# Patient Record
Sex: Male | Born: 1960 | Race: Black or African American | Hispanic: No | Marital: Single | Smoking: Current every day smoker
Health system: Southern US, Community
[De-identification: ages and names within clinical notes are randomized; demographics above are authoritative.]

## PROBLEM LIST (undated history)

## (undated) ENCOUNTER — Ambulatory Visit (HOSPITAL_COMMUNITY): Admission: EM | Payer: Managed Care, Other (non HMO)

## (undated) DIAGNOSIS — I252 Old myocardial infarction: Secondary | ICD-10-CM

## (undated) DIAGNOSIS — J45909 Unspecified asthma, uncomplicated: Secondary | ICD-10-CM

## (undated) DIAGNOSIS — I2510 Atherosclerotic heart disease of native coronary artery without angina pectoris: Secondary | ICD-10-CM

## (undated) DIAGNOSIS — J449 Chronic obstructive pulmonary disease, unspecified: Secondary | ICD-10-CM

## (undated) HISTORY — PX: HERNIA REPAIR: SHX51

---

## 1998-03-26 ENCOUNTER — Emergency Department (HOSPITAL_COMMUNITY): Admission: EM | Admit: 1998-03-26 | Discharge: 1998-03-26 | Payer: Self-pay | Admitting: Family Medicine

## 1999-07-14 ENCOUNTER — Encounter: Payer: Self-pay | Admitting: Emergency Medicine

## 1999-07-14 ENCOUNTER — Emergency Department (HOSPITAL_COMMUNITY): Admission: EM | Admit: 1999-07-14 | Discharge: 1999-07-14 | Payer: Self-pay | Admitting: Emergency Medicine

## 2002-05-26 ENCOUNTER — Encounter: Payer: Self-pay | Admitting: Emergency Medicine

## 2002-05-26 ENCOUNTER — Emergency Department (HOSPITAL_COMMUNITY): Admission: EM | Admit: 2002-05-26 | Discharge: 2002-05-26 | Payer: Self-pay | Admitting: Emergency Medicine

## 2004-10-20 ENCOUNTER — Emergency Department (HOSPITAL_COMMUNITY): Admission: EM | Admit: 2004-10-20 | Discharge: 2004-10-20 | Payer: Self-pay | Admitting: Emergency Medicine

## 2004-10-20 ENCOUNTER — Emergency Department (HOSPITAL_COMMUNITY): Admission: EM | Admit: 2004-10-20 | Discharge: 2004-10-21 | Payer: Self-pay | Admitting: Emergency Medicine

## 2004-11-29 ENCOUNTER — Emergency Department (HOSPITAL_COMMUNITY): Admission: EM | Admit: 2004-11-29 | Discharge: 2004-11-29 | Payer: Self-pay | Admitting: Emergency Medicine

## 2011-09-17 ENCOUNTER — Encounter (HOSPITAL_COMMUNITY): Payer: Self-pay | Admitting: *Deleted

## 2011-09-17 ENCOUNTER — Emergency Department (HOSPITAL_COMMUNITY): Payer: Self-pay

## 2011-09-17 ENCOUNTER — Emergency Department (HOSPITAL_COMMUNITY)
Admission: EM | Admit: 2011-09-17 | Discharge: 2011-09-17 | Disposition: A | Discharge status: Home / Self Care | Payer: Self-pay | Attending: Emergency Medicine | Admitting: Emergency Medicine

## 2011-09-17 DIAGNOSIS — A088 Other specified intestinal infections: Secondary | ICD-10-CM | POA: Insufficient documentation

## 2011-09-17 DIAGNOSIS — R109 Unspecified abdominal pain: Secondary | ICD-10-CM | POA: Insufficient documentation

## 2011-09-17 DIAGNOSIS — R05 Cough: Secondary | ICD-10-CM

## 2011-09-17 DIAGNOSIS — K4090 Unilateral inguinal hernia, without obstruction or gangrene, not specified as recurrent: Secondary | ICD-10-CM | POA: Insufficient documentation

## 2011-09-17 DIAGNOSIS — R059 Cough, unspecified: Secondary | ICD-10-CM | POA: Insufficient documentation

## 2011-09-17 DIAGNOSIS — A084 Viral intestinal infection, unspecified: Secondary | ICD-10-CM

## 2011-09-17 DIAGNOSIS — R51 Headache: Secondary | ICD-10-CM | POA: Insufficient documentation

## 2011-09-17 LAB — DIFFERENTIAL
Eosinophils Absolute: 0 10*3/uL (ref 0.0–0.7)
Lymphs Abs: 1.6 10*3/uL (ref 0.7–4.0)
Monocytes Absolute: 0.6 10*3/uL (ref 0.1–1.0)
Monocytes Relative: 11 % (ref 3–12)
Neutrophils Relative %: 61 % (ref 43–77)

## 2011-09-17 LAB — COMPREHENSIVE METABOLIC PANEL
Alkaline Phosphatase: 59 U/L (ref 39–117)
BUN: 10 mg/dL (ref 6–23)
Creatinine, Ser: 0.8 mg/dL (ref 0.50–1.35)
GFR calc Af Amer: >90 mL/min (ref >90)
Glucose, Bld: 104 mg/dL — ABNORMAL HIGH (ref 70–99)
Potassium: 3.5 mEq/L (ref 3.5–5.1)
Total Bilirubin: 0.2 mg/dL — ABNORMAL LOW (ref 0.3–1.2)
Total Protein: 6.8 g/dL (ref 6.0–8.3)

## 2011-09-17 LAB — URINALYSIS, ROUTINE W REFLEX MICROSCOPIC
Bilirubin Urine: NEGATIVE
Ketones, ur: NEGATIVE mg/dL
Nitrite: NEGATIVE
Specific Gravity, Urine: 1.024 (ref 1.005–1.030)
Urobilinogen, UA: 4 mg/dL — ABNORMAL HIGH (ref 0.0–1.0)

## 2011-09-17 LAB — CBC
HCT: 41.5 % (ref 39.0–52.0)
Hemoglobin: 15.1 g/dL (ref 13.0–17.0)
MCH: 33.9 pg (ref 26.0–34.0)
MCV: 93.3 fL (ref 78.0–100.0)
RBC: 4.45 MIL/uL (ref 4.22–5.81)

## 2011-09-17 LAB — LIPASE, BLOOD: Lipase: 21 U/L (ref 11–59)

## 2011-09-17 MED ORDER — AZITHROMYCIN 250 MG PO TABS: 250.0000 mg | ORAL_TABLET | Freq: Every day | ORAL | Status: AC

## 2011-09-17 MED ORDER — IOHEXOL 300 MG/ML  SOLN
100.0000 mL | Freq: Once | INTRAMUSCULAR | Status: AC | PRN
  Administered 2011-09-17: 100 mL via INTRAVENOUS

## 2011-09-17 MED ORDER — MORPHINE SULFATE 4 MG/ML IJ SOLN
4.0000 mg | Freq: Once | INTRAMUSCULAR | Status: AC
  Administered 2011-09-17: 4 mg via INTRAVENOUS
  Filled 2011-09-17: qty 1

## 2011-09-17 MED ORDER — SODIUM CHLORIDE 0.9 % IV BOLUS (SEPSIS)
1000.0000 mL | Freq: Once | INTRAVENOUS | Status: AC
  Administered 2011-09-17: 1000 mL via INTRAVENOUS

## 2011-09-17 MED ORDER — ONDANSETRON HCL 4 MG/2ML IJ SOLN
4.0000 mg | Freq: Once | INTRAMUSCULAR | Status: AC
  Administered 2011-09-17: 4 mg via INTRAVENOUS
  Filled 2011-09-17: qty 2

## 2011-09-17 MED ORDER — SODIUM CHLORIDE 0.9 % IV SOLN
Freq: Once | INTRAVENOUS | Status: AC
  Administered 2011-09-17: 13:00:00 via INTRAVENOUS

## 2011-09-17 MED ORDER — ACETAMINOPHEN 325 MG PO TABS
650.0000 mg | ORAL_TABLET | Freq: Four times a day (QID) | ORAL | Status: DC | PRN
  Administered 2011-09-17: 650 mg via ORAL
  Filled 2011-09-17: qty 2

## 2011-09-17 MED ORDER — AZITHROMYCIN 250 MG PO TABS
500.0000 mg | ORAL_TABLET | Freq: Once | ORAL | Status: AC
  Administered 2011-09-17: 500 mg via ORAL
  Filled 2011-09-17: qty 2

## 2011-09-17 NOTE — ED Notes (Signed)
MD at bedside. (Dr. Webb) 

## 2011-09-17 NOTE — ED Notes (Addendum)
Pt also c/o productive cough w/thick white colored mucus x5 days

## 2011-09-17 NOTE — ED Provider Notes (Signed)
Patient moved to CDU pending completion of diagnostic testing in the evaluation of abdominal pain with fever.  4:29 PM CT results reviewed and discussed with patient and with Dr. Hyman Hopes.  No acute abdominal findings.  Patient is feeling better, temperature 99.3, HR 90.  No additional episodes of diarrhea or vomiting while in ED.  Patient is tolerating po fluids without difficulty.  Will cover with azithromycin d/t emphysema with cough and fever.  Jimmye Norman, NP 09/17/11 2026

## 2011-09-17 NOTE — ED Notes (Signed)
Pt resting.  Denies any needs.   

## 2011-09-17 NOTE — ED Notes (Signed)
Report received, assumed care.  

## 2011-09-17 NOTE — ED Provider Notes (Signed)
History     CSN: [id number]  Arrival date & time 09/17/11  0865   First MD Initiated Contact with Patient 09/17/11 [phone]      Chief Complaint  Patient presents with  . Fever  . Nausea  . Emesis  . Diarrhea  . Cough  . Headache    (Consider location/radiation/quality/duration/timing/severity/associated sxs/prior treatment) HPI  50yoM pw fever. She complaining of multiple complaints. He states for the past 2-3 days he has experienced productive cough, shortness of breath, mild chest pain as well as generalized body aches. He complains of severe 7/10 abdominal pain which is diffuse. Complains of nausea with multiple episodes of nonbilious nonbloody emesis. Denies diarrhea. Denies hematuria/dysuria/freq/urgency.  ED Notes, ED Provider Notes from 09/17/11 0000 to 09/17/11 08:42:31       Melissa Loyal Gambler, RN 09/17/2011 08:36      Pt reports his mother has been sick at [facility] and he refused to wear a mask. Reports productive cough, fever, n/v/d, headache, and generalized bodyaches.   History reviewed. No pertinent past medical history.  History reviewed. No pertinent past surgical history.  History reviewed. No pertinent family history.  History  Substance Use Topics  . Smoking status: Current Everyday Smoker  . Smokeless tobacco: Not on file  . Alcohol Use: Yes     occ    Review of Systems  All other systems reviewed and are negative.   except as noted HPI   Allergies  Penicillins  Home Medications   Current Outpatient Rx  Name Route Sig Dispense Refill  . ASPIRIN 325 MG PO TABS Oral Take 325-650 mg by mouth every 6 (six) hours as needed. Pain.    Marland Kitchen PHENOL 1.4 % MT LIQD Mouth/Throat Use as directed 2 sprays in the mouth or throat as needed. Sore throat.    Marland Kitchen PSEUDOEPH-DOXYLAMINE-DM-APAP 60-7.11-17-998 MG/30ML PO LIQD Oral Take 30 mLs by mouth every 12 (twelve) hours as needed. As needed for cold symptoms    . AZITHROMYCIN 250 MG PO TABS Oral Take 1 tablet  (250 mg total) by mouth daily. Take  1 every day until finished. 4 tablet 0    BP 113/57  Pulse 89  Temp(Src) 100.7 F (38.2 C) (Oral)  Resp 18  SpO2 98%  Physical Exam  Nursing note and vitals reviewed. Constitutional: He is oriented to person, place, and time. He appears well-developed and well-nourished. No distress.  HENT:  Head: Atraumatic.  Mouth/Throat: Oropharynx is clear and moist.  Eyes: Conjunctivae are normal. Pupils are equal, round, and reactive to light.  Neck: Neck supple.  Cardiovascular: Normal rate, regular rhythm, normal heart sounds and intact distal pulses.  Exam reveals no gallop and no friction rub.   No murmur heard. Pulmonary/Chest: Effort normal. No respiratory distress. He has no wheezes. He has no rales.       Crackles R base, L mid lung fields  Abdominal: Soft. Bowel sounds are normal. There is tenderness. There is no rebound and no guarding.       Diffuse ttp  Musculoskeletal: Normal range of motion. He exhibits no edema and no tenderness.  Neurological: He is alert and oriented to person, place, and time.  Skin: Skin is warm and dry.  Psychiatric: He has a normal mood and affect.    ED Course  Procedures (including critical care time)  Labs Reviewed  CBC - Abnormal; Notable for the following:    MCHC 36.4 (*)    Platelets 120 (*)    All other  components within normal limits  COMPREHENSIVE METABOLIC PANEL - Abnormal; Notable for the following:    Sodium 134 (*)    Glucose, Bld 104 (*)    Albumin 3.1 (*)    AST 45 (*)    Total Bilirubin 0.2 (*)    All other components within normal limits  URINALYSIS, ROUTINE W REFLEX MICROSCOPIC - Abnormal; Notable for the following:    APPearance HAZY (*)    Urobilinogen, UA 4.0 (*)    Leukocytes, UA SMALL (*)    All other components within normal limits  DIFFERENTIAL  LIPASE, BLOOD  URINE MICROSCOPIC-ADD ON  LAB REPORT - SCANNED   Dg Chest 2 View  09/17/2011  *RADIOLOGY REPORT*  Clinical Data:  Cough.  Fever.  Nausea and vomiting.  CHEST - 2 VIEW  Comparison: None.  Findings: The cardiopericardial silhouette and mediastinal contours appear within normal limits.  Emphysema is present with hyperinflation and enlargement retrosternal clear space.  No airspace disease.  No effusion.  Prominence of the pulmonary hila is likely associated with emphysema.  Bilateral AC joint osteoarthritis.  IMPRESSION: Emphysema without acute cardiopulmonary disease.  Original Report Authenticated By: Andreas Newport, M.D.   Ct Abdomen Pelvis W Contrast  09/17/2011  *RADIOLOGY REPORT*  Clinical Data: Headache with nausea, vomiting and diarrhea for 5 days.  CT ABDOMEN AND PELVIS WITH CONTRAST  Technique:  Multidetector CT imaging of the abdomen and pelvis was performed following the standard protocol during bolus administration of intravenous contrast.  Contrast: OMNIPAQUE IOHEXOL 300 MG/ML IJ SOLN  Comparison: None.  Findings: The lung bases are clear and there is no pleural effusion.  The liver, gallbladder, biliary system and pancreas appear normal.  The spleen, adrenal glands and kidneys appear normal.  The bowel gas pattern is normal. There are mild diverticular changes of the colon, especially in the descending and sigmoid areas.  The appendix appears normal.  There are no enlarged lymph nodes or inflammatory changes.  The prostate gland appears mildly enlarged.  There is an atypical dense and somewhat linear calcification posteriorly in the prostate gland, measuring up to 1.4 cm in diameter.  This is visible on the scout image and appears to be posterior to the expected position of the urethra.  There are no definite surrounding inflammatory changes.  The urinary bladder appears normal.  There is a small right inguinal hernia containing only fat.  Mild disc bulging is present at L5-S1.  There are no worrisome osseous findings.  IMPRESSION:  1.  No definite acute abdominal pelvic findings.  Normal bowel gas pattern  without evidence of appendicitis. 2.  Atypical dense linear calcification within the prostate gland of uncertain significance. 3.  Small right inguinal hernia containing only fat.  Original Report Authenticated By: Gerrianne Scale, M.D.     1. Abdominal  pain, other specified site   2. Viral gastroenteritis   3. Productive cough     MDM  Likely viral syndrome. Pt with multiple complaints. Feeling better after tylenol in ED. CXR without pneumonia. He continued to have 9/10 abdominal pain, therefore an abd ct was ordered which was unremarkable. Pt placed in CDU, signed out to Felicie Morn previously who will discharge with azithromycin for bronchitis.        Forbes Cellar, MD 09/18/11 [phone]

## 2011-09-17 NOTE — ED Notes (Signed)
Patient transported to CT 

## 2011-09-17 NOTE — ED Notes (Signed)
Pt reports his mother has been sick at [facility] and he refused to wear a mask. Reports productive cough, fever, n/v/d, headache, and generalized bodyaches.

## 2011-09-17 NOTE — ED Notes (Signed)
Family at bedside. 

## 2011-09-17 NOTE — Discharge Instructions (Signed)
Cough, Adult  A cough is a reflex that helps clear your throat and airways. It can help heal the body or may be a reaction to an irritated airway. A cough may only last 2 or 3 weeks (acute) or may last more than 8 weeks (chronic).  CAUSES Acute cough:  Viral or bacterial infections.  Chronic cough:  Infections.   Allergies.   Asthma.   Post-nasal drip.   Smoking.   Heartburn or acid reflux.   Some medicines.   Chronic lung problems (COPD).   Cancer.  SYMPTOMS   Cough.   Fever.   Chest pain.   Increased breathing rate.   High-pitched whistling sound when breathing (wheezing).   Colored mucus that you cough up (sputum).  TREATMENT   A bacterial cough may be treated with antibiotic medicine.   A viral cough must run its course and will not respond to antibiotics.   Your caregiver may recommend other treatments if you have a chronic cough.  HOME CARE INSTRUCTIONS   Only take over-the-counter or prescription medicines for pain, discomfort, or fever as directed by your caregiver. Use cough suppressants only as directed by your caregiver.   Use a cold steam vaporizer or humidifier in your bedroom or home to help loosen secretions.   Sleep in a semi-upright position if your cough is worse at night.   Rest as needed.   Stop smoking if you smoke.  SEEK IMMEDIATE MEDICAL CARE IF:   You have pus in your sputum.   Your cough starts to worsen.   You cannot control your cough with suppressants and are losing sleep.   You begin coughing up blood.   You have difficulty breathing.   You develop pain which is getting worse or is uncontrolled with medicine.   You have a fever.  MAKE SURE YOU:   Understand these instructions.   Will watch your condition.   Will get help right away if you are not doing well or get worse.  Document Released: 12/04/2010 Document Revised: 05/27/2011 Document Reviewed: 12/04/2010 [facility] Patient Information 2012 [place],  [place].  Viral Gastroenteritis Viral gastroenteritis is also known as stomach flu. This condition affects the stomach and intestinal tract. It can cause sudden diarrhea and vomiting. The illness typically lasts 3 to 8 days. Most people develop an immune response that eventually gets rid of the virus. While this natural response develops, the virus can make you quite ill. CAUSES  Many different viruses can cause gastroenteritis, such as rotavirus or noroviruses. You can catch one of these viruses by consuming contaminated food or water. You may also catch a virus by sharing utensils or other personal items with an infected person or by touching a contaminated surface. SYMPTOMS  The most common symptoms are diarrhea and vomiting. These problems can cause a severe loss of body fluids (dehydration) and a body salt (electrolyte) imbalance. Other symptoms may include:  Fever.   Headache.   Fatigue.   Abdominal pain.  DIAGNOSIS  Your caregiver can usually diagnose viral gastroenteritis based on your symptoms and a physical exam. A stool sample may also be taken to test for the presence of viruses or other infections. TREATMENT  This illness typically goes away on its own. Treatments are aimed at rehydration. The most serious cases of viral gastroenteritis involve vomiting so severely that you are not able to keep fluids down. In these cases, fluids must be given through an intravenous line (IV). HOME CARE INSTRUCTIONS   Drink enough  fluids to keep your urine clear or pale yellow. Drink small amounts of fluids frequently and increase the amounts as tolerated.   Ask your caregiver for specific rehydration instructions.   Avoid:   Foods high in sugar.   Alcohol.   Carbonated drinks.   Tobacco.   Juice.   Caffeine drinks.   Extremely hot or cold fluids.   Fatty, greasy foods.   Too much intake of anything at one time.   Dairy products until 24 to 48 hours after diarrhea stops.   You  may consume probiotics. Probiotics are active cultures of beneficial bacteria. They may lessen the amount and number of diarrheal stools in adults. Probiotics can be found in yogurt with active cultures and in supplements.   Wash your hands well to avoid spreading the virus.   Only take over-the-counter or prescription medicines for pain, discomfort, or fever as directed by your caregiver. Do not give aspirin to children. Antidiarrheal medicines are not recommended.   Ask your caregiver if you should continue to take your regular prescribed and over-the-counter medicines.   Keep all follow-up appointments as directed by your caregiver.  SEEK IMMEDIATE MEDICAL CARE IF:   You are unable to keep fluids down.   You do not urinate at least once every 6 to 8 hours.   You develop shortness of breath.   You notice blood in your stool or vomit. This may look like coffee grounds.   You have abdominal pain that increases or is concentrated in one small area (localized).   You have persistent vomiting or diarrhea.   You have a fever.   The patient is a child younger than 3 months, and he or she has a fever.   The patient is a child older than 3 months, and he or she has a fever and persistent symptoms.   The patient is a child older than 3 months, and he or she has a fever and symptoms suddenly get worse.   The patient is a baby, and he or she has no tears when crying.  MAKE SURE YOU:   Understand these instructions.   Will watch your condition.   Will get help right away if you are not doing well or get worse.  Document Released: 06/07/2005 Document Revised: 05/27/2011 Document Reviewed: 03/24/2011 [facility] Patient Information 2012 [place], [place].

## 2011-09-17 NOTE — ED Notes (Signed)
Reports no emesis, diarrhea since he has been here in ED.  Respirations even & unlabored, no distress.

## 2011-09-17 NOTE — ED Notes (Signed)
Pt c/o headache, N/V/D, non cardiac chest pain, chills, and weakness x5 days. Pt reports visiting his mother at [facility], reports his mother was dx w/a virus, pt unsure of virus but reports his s/s are identical to hers, pt refused to wear a mask while visiting his mother.

## 2011-09-17 NOTE — ED Notes (Signed)
Finished contrast, CT notified & aware.

## 2011-09-18 NOTE — ED Provider Notes (Signed)
Medical screening examination/treatment/procedure(s) were conducted as a shared visit with non-physician practitioner(s) and myself.  I personally evaluated the patient during the encounter  See my full note  Forbes Cellar, MD 09/18/11 [phone]

## 2012-01-06 ENCOUNTER — Encounter (HOSPITAL_COMMUNITY): Payer: Self-pay | Admitting: Emergency Medicine

## 2012-01-06 ENCOUNTER — Emergency Department (HOSPITAL_COMMUNITY): Payer: Self-pay

## 2012-01-06 ENCOUNTER — Emergency Department (HOSPITAL_COMMUNITY)
Admission: EM | Admit: 2012-01-06 | Discharge: 2012-01-06 | Disposition: A | Discharge status: Home / Self Care | Payer: Self-pay | Attending: Emergency Medicine | Admitting: Emergency Medicine

## 2012-01-06 DIAGNOSIS — R202 Paresthesia of skin: Secondary | ICD-10-CM

## 2012-01-06 DIAGNOSIS — F17200 Nicotine dependence, unspecified, uncomplicated: Secondary | ICD-10-CM | POA: Insufficient documentation

## 2012-01-06 DIAGNOSIS — J40 Bronchitis, not specified as acute or chronic: Secondary | ICD-10-CM | POA: Insufficient documentation

## 2012-01-06 DIAGNOSIS — R209 Unspecified disturbances of skin sensation: Secondary | ICD-10-CM | POA: Insufficient documentation

## 2012-01-06 MED ORDER — ALBUTEROL SULFATE HFA 108 (90 BASE) MCG/ACT IN AERS
2.0000 | INHALATION_SPRAY | RESPIRATORY_TRACT | Status: DC | PRN
  Administered 2012-01-06: 2 via RESPIRATORY_TRACT
  Filled 2012-01-06: qty 6.7

## 2012-01-06 MED ORDER — ALBUTEROL SULFATE (5 MG/ML) 0.5% IN NEBU
5.0000 mg | INHALATION_SOLUTION | Freq: Once | RESPIRATORY_TRACT | Status: AC
  Administered 2012-01-06: 5 mg via RESPIRATORY_TRACT
  Filled 2012-01-06: qty 1

## 2012-01-06 NOTE — ED Notes (Signed)
coold symptoms started 2 weeks ago, coughing up phlegm, leg numbness started 2 days ago--right leg only

## 2012-01-06 NOTE — ED Provider Notes (Signed)
History     CSN: [id number]  Arrival date & time 01/06/12  1306   First MD Initiated Contact with Patient 01/06/12 1318      Chief Complaint  Patient presents with  . URI  . legs numb     (Consider location/radiation/quality/duration/timing/severity/associated sxs/prior treatment) Patient is a 51 y.o. male presenting with URI. The history is provided by the patient.  URI The primary symptoms include cough. Primary symptoms do not include fever, headaches, ear pain, sore throat, abdominal pain, nausea, vomiting or myalgias. Primary symptoms comment: neg chest pain Episode onset: 2 weeks ago. This is a new problem. The problem has not changed since onset. The cough is productive. The sputum is white. It is exacerbated by smoking.  Symptoms associated with the illness include congestion. The illness is not associated with chills, plugged ear sensation, facial pain or rhinorrhea. The following treatments were addressed: A decongestant was ineffective.  No baseline lung disease. Tried nephew's inhaler yesterday with some sx improvement. Also with c/o numbness to dorsum of right mid-foot and a shooting sensation (not painful) up the entire extremity anteriorly. Feels his leg has given out on him several times and he has fallen twice without injury. Denies fever, HA, back pain. Able to ambulate. No known injury and no hx similar symptoms. No aggravating or alleviating factors, no prior tx attempted.  History reviewed. No pertinent past medical history.  Past Surgical History  Procedure Date  . Hernia repair     No family history on file.  History  Substance Use Topics  . Smoking status: Current Everyday Smoker    Types: Cigarettes  . Smokeless tobacco: Not on file  . Alcohol Use: Yes     occ      Review of Systems  Constitutional: Negative for fever and chills.  HENT: Positive for congestion. Negative for ear pain, sore throat and rhinorrhea.   Respiratory: Positive for cough.    Gastrointestinal: Negative for nausea, vomiting and abdominal pain.  Musculoskeletal: Negative for myalgias.  Neurological: Negative for headaches.  10 systems reviewed and are otherwise negative for acute change except as noted in the HPI.   Allergies  Penicillins  Home Medications   Current Outpatient Rx  Name Route Sig Dispense Refill  . ASPIRIN 325 MG PO TABS Oral Take 325-650 mg by mouth every 6 (six) hours as needed. Pain.      BP 143/79  Pulse 88  Temp 98.6 F (37 C) (Oral)  Resp 16  SpO2 97%  Physical Exam  Nursing note and vitals reviewed. Constitutional: He is oriented to person, place, and time. He appears well-developed and well-nourished. No distress.  HENT:  Head: Normocephalic and atraumatic.  Right Ear: External ear normal.  Left Ear: External ear normal.  Nose: Nose normal.  Mouth/Throat: Oropharynx is clear and moist. No oropharyngeal exudate.  Eyes: Conjunctivae and EOM are normal. Pupils are equal, round, and reactive to light.  Neck: Neck supple.       No bruit heard  Cardiovascular: Normal rate, regular rhythm and normal heart sounds.   No murmur heard.      Bilateral radial and DP pulses are 2+  Pulmonary/Chest: Effort normal. No respiratory distress. He has wheezes (faint, expiratory). He has no rales. He exhibits no tenderness.  Abdominal: Soft. Bowel sounds are normal. He exhibits no distension. There is no tenderness.  Musculoskeletal: Normal range of motion. He exhibits no edema and no tenderness.       No TTP entire  spine.  Lymphadenopathy:    He has no cervical adenopathy.  Neurological: He is alert and oriented to person, place, and time. He has normal strength. No cranial nerve deficit (3-12 intact). Sensory deficit: pt reports altered but present sensation to light touch to right leg circumferentially. Gait normal. GCS eye subscore is 4. GCS verbal subscore is 5. GCS motor subscore is 6.  Reflex Scores:      Patellar reflexes are 2+ on  the right side and 2+ on the left side. Skin: Skin is dry. No rash noted.  Psychiatric: He has a normal mood and affect.    ED Course  Procedures (including critical care time)  Labs Reviewed - No data to display Dg Chest 1 View  01/06/2012  *RADIOLOGY REPORT*  Clinical Data: Cough and shortness of breath.  CHEST - 1 VIEW  Comparison: Chest 09/17/2011.  Findings: Lungs are clear.  Heart size is normal.  No pneumothorax or pleural fluid.  IMPRESSION: No acute disease.  Original Report Authenticated By: Bernadene Bell. D'ALESSIO, M.D.     1. Bronchitis   2. Paresthesia of right foot       MDM  URI sx- most c/w bronchitis. Afebrile with no SOB, no hypoxia, normal CXR- doubt pneumonia. No hemoptysis, recent travel, LE swelling or pain, PERC neg- doubt PE. Sx improved with albuterol inhaler yesterday, will rx for this today. LE complaint- doubt spinal source of sx as no back pain, pattern not c/w specific nerve distribution. No a-fib, no carotid bruit, atypical sx- not c/w CVA. Although altered, sensation is present and strength is preserved. I feel this can be followed-up on an outpatient basis and discussed with pt return precautions.        Shaaron Adler, [place] 01/06/12 [phone]

## 2012-01-06 NOTE — ED Notes (Signed)
Pt presenting to ed with c/o "loosing the feeling in my right leg" x 5 days pt states positive numbness. Pt states he fell yesterday. Pt states right leg feels like it's constantly sleep. Pt states he is also coughing up white colored phelgm pt states he has a productive cough. Pt denies pain but states chest pain only when he coughs.

## 2012-01-07 NOTE — ED Provider Notes (Signed)
Medical screening examination/treatment/procedure(s) were performed by non-physician practitioner and as supervising physician I was immediately available for consultation/collaboration. We discussed the patient's case throughout his ED evaluation.  Gerhard Munch, MD 01/07/12 [phone]

## 2012-01-13 ENCOUNTER — Encounter (HOSPITAL_COMMUNITY): Payer: Self-pay | Admitting: *Deleted

## 2012-01-13 ENCOUNTER — Emergency Department (HOSPITAL_COMMUNITY)
Admission: EM | Admit: 2012-01-13 | Discharge: 2012-01-13 | Disposition: A | Discharge status: Home / Self Care | Payer: Self-pay | Attending: Emergency Medicine | Admitting: Emergency Medicine

## 2012-01-13 ENCOUNTER — Emergency Department (HOSPITAL_COMMUNITY): Payer: Self-pay

## 2012-01-13 DIAGNOSIS — Z7982 Long term (current) use of aspirin: Secondary | ICD-10-CM | POA: Insufficient documentation

## 2012-01-13 DIAGNOSIS — I252 Old myocardial infarction: Secondary | ICD-10-CM | POA: Insufficient documentation

## 2012-01-13 DIAGNOSIS — J40 Bronchitis, not specified as acute or chronic: Secondary | ICD-10-CM | POA: Insufficient documentation

## 2012-01-13 DIAGNOSIS — F17200 Nicotine dependence, unspecified, uncomplicated: Secondary | ICD-10-CM | POA: Insufficient documentation

## 2012-01-13 HISTORY — DX: Old myocardial infarction: I25.2

## 2012-01-13 LAB — CBC WITH DIFFERENTIAL/PLATELET
Eosinophils Absolute: 0.1 10*3/uL (ref 0.0–0.7)
Eosinophils Relative: 1 % (ref 0–5)
Hemoglobin: 15.5 g/dL (ref 13.0–17.0)
Lymphs Abs: 1.5 10*3/uL (ref 0.7–4.0)
MCH: 33.9 pg (ref 26.0–34.0)
MCV: 93.2 fL (ref 78.0–100.0)
Monocytes Relative: 10 % (ref 3–12)
RBC: 4.57 MIL/uL (ref 4.22–5.81)

## 2012-01-13 LAB — COMPREHENSIVE METABOLIC PANEL
Alkaline Phosphatase: 69 U/L (ref 39–117)
BUN: 14 mg/dL (ref 6–23)
Calcium: 9.7 mg/dL (ref 8.4–10.5)
GFR calc Af Amer: >90 mL/min (ref >90)
Glucose, Bld: 109 mg/dL — ABNORMAL HIGH (ref 70–99)
Potassium: 3.8 mEq/L (ref 3.5–5.1)
Total Protein: 7.6 g/dL (ref 6.0–8.3)

## 2012-01-13 LAB — POCT I-STAT, CHEM 8
Calcium, Ion: 1.23 mmol/L (ref 1.12–1.23)
Chloride: 104 mEq/L (ref 96–112)
HCT: 47 % (ref 39.0–52.0)
Hemoglobin: 16 g/dL (ref 13.0–17.0)

## 2012-01-13 LAB — D-DIMER, QUANTITATIVE: D-Dimer, Quant: 0.27 ug/mL-FEU (ref 0.00–0.48)

## 2012-01-13 MED ORDER — HYDROCOD POLST-CHLORPHEN POLST 10-8 MG/5ML PO LQCR: 5.0000 mL | Freq: Two times a day (BID) | ORAL | Status: DC | PRN

## 2012-01-13 MED ORDER — PREDNISONE (PAK) 10 MG PO TABS: ORAL_TABLET | ORAL | Status: AC

## 2012-01-13 MED ORDER — ASPIRIN 81 MG PO CHEW
324.0000 mg | CHEWABLE_TABLET | Freq: Once | ORAL | Status: AC
  Administered 2012-01-13: 324 mg via ORAL
  Filled 2012-01-13: qty 4

## 2012-01-13 MED ORDER — AZITHROMYCIN 1 G PO PACK: 1.0000 | PACK | Freq: Once | ORAL | Status: AC

## 2012-01-13 MED ORDER — ONDANSETRON HCL 4 MG/2ML IJ SOLN
4.0000 mg | Freq: Once | INTRAMUSCULAR | Status: AC
  Administered 2012-01-13: 4 mg via INTRAVENOUS
  Filled 2012-01-13: qty 2

## 2012-01-13 MED ORDER — IBUPROFEN 800 MG PO TABS
800.0000 mg | ORAL_TABLET | Freq: Once | ORAL | Status: AC
  Administered 2012-01-13: 800 mg via ORAL
  Filled 2012-01-13: qty 1

## 2012-01-13 MED ORDER — ACETAMINOPHEN 325 MG PO TABS: 650.0000 mg | ORAL_TABLET | Freq: Once | ORAL | Status: DC

## 2012-01-13 MED ORDER — ALBUTEROL SULFATE HFA 108 (90 BASE) MCG/ACT IN AERS
2.0000 | INHALATION_SPRAY | RESPIRATORY_TRACT | Status: DC | PRN
  Administered 2012-01-13: 2 via RESPIRATORY_TRACT
  Filled 2012-01-13: qty 6.7

## 2012-01-13 MED ORDER — IBUPROFEN 800 MG PO TABS: 800.0000 mg | ORAL_TABLET | Freq: Three times a day (TID) | ORAL | Status: AC

## 2012-01-13 NOTE — ED Provider Notes (Addendum)
History     CSN: [id number]  Arrival date & time 01/13/12  4098   First MD Initiated Contact with Patient 01/13/12 2002      9:03 PM HPI Reports coughing for 2-3 weeks. Reports this evening had sudden onset left-sided chest pain. Reports pain was worse with deep breathing and coughing. Reports feeling rather short of breath as well. Upon arrival also developed fever. Temperature was 100.1 and has increased to 103.4. Denies productive cough. Denies sore throat. Patient reports still smoking cigarettes. States he was given albuterol inhaler about a week ago has finished inhaler but still having cough. Patient is a 51 y.o. male presenting with cough. The history is provided by the patient.  Cough This is a new problem. The problem occurs every few minutes. The problem has been gradually worsening. The cough is non-productive. The maximum temperature recorded prior to his arrival was 103 to 104 F. Associated symptoms include chest pain, chills and shortness of breath. Pertinent negatives include no ear congestion, no ear pain, no headaches, no rhinorrhea, no sore throat, no myalgias and no wheezing. Treatments tried: albuterol inhaler. The treatment provided mild relief. He is a smoker.    Past Medical History  Diagnosis Date  . MI, old     Past Surgical History  Procedure Date  . Hernia repair     No family history on file.  History  Substance Use Topics  . Smoking status: Current Everyday Smoker    Types: Cigarettes  . Smokeless tobacco: Not on file  . Alcohol Use: Yes     occ      Review of Systems  Constitutional: Positive for fever and chills.  HENT: Negative for ear pain, sore throat and rhinorrhea.   Respiratory: Positive for cough and shortness of breath. Negative for wheezing.   Cardiovascular: Positive for chest pain.  Gastrointestinal: Positive for nausea. Negative for vomiting and abdominal pain.  Musculoskeletal: Negative for myalgias.  Neurological: Negative  for headaches.  All other systems reviewed and are negative.    Allergies  Penicillins  Home Medications   Current Outpatient Rx  Name Route Sig Dispense Refill  . ASPIRIN 325 MG PO TABS Oral Take 325-650 mg by mouth every 6 (six) hours as needed. Pain.      BP 126/73  Pulse 101  Temp 103.4 F (39.7 C) (Rectal)  Resp 22  SpO2 97%  Physical Exam  Vitals reviewed. Constitutional: He is oriented to person, place, and time. He appears well-developed and well-nourished.       Febrile  HENT:  Head: Normocephalic and atraumatic.  Eyes: Conjunctivae are normal. Pupils are equal, round, and reactive to light.  Neck: Normal range of motion. Neck supple.  Cardiovascular: Normal rate, regular rhythm and normal heart sounds.   Pulmonary/Chest: Effort normal and breath sounds normal. He exhibits tenderness (left sided).  Abdominal: Soft. Bowel sounds are normal.  Neurological: He is alert and oriented to person, place, and time.  Skin: Skin is warm and dry. No rash noted. No erythema. No pallor.  Psychiatric: He has a normal mood and affect. His behavior is normal.    ED Course  Procedures  Results for orders placed during the hospital encounter of 01/13/12  CBC WITH DIFFERENTIAL      Component Value Range   WBC 10.3  4.0 - 10.5 K/uL   RBC 4.57  4.22 - 5.81 MIL/uL   Hemoglobin 15.5  13.0 - 17.0 g/dL   HCT 11.9  14.7 - 82.9 %  MCV 93.2  78.0 - 100.0 fL   MCH 33.9  26.0 - 34.0 pg   MCHC 36.4 (*) 30.0 - 36.0 g/dL   RDW 84.6  96.2 - 95.2 %   Platelets 198  150 - 400 K/uL   Neutrophils Relative 75  43 - 77 %   Neutro Abs 7.8 (*) 1.7 - 7.7 K/uL   Lymphocytes Relative 14  12 - 46 %   Lymphs Abs 1.5  0.7 - 4.0 K/uL   Monocytes Relative 10  3 - 12 %   Monocytes Absolute 1.0  0.1 - 1.0 K/uL   Eosinophils Relative 1  0 - 5 %   Eosinophils Absolute 0.1  0.0 - 0.7 K/uL   Basophils Relative 0  0 - 1 %   Basophils Absolute 0.0  0.0 - 0.1 K/uL  COMPREHENSIVE METABOLIC PANEL       Component Value Range   Sodium 135  135 - 145 mEq/L   Potassium 3.8  3.5 - 5.1 mEq/L   Chloride 99  96 - 112 mEq/L   CO2 22  19 - 32 mEq/L   Glucose, Bld 109 (*) 70 - 99 mg/dL   BUN 14  6 - 23 mg/dL   Creatinine, Ser 8.41  0.50 - 1.35 mg/dL   Calcium 9.7  8.4 - 32.4 mg/dL   Total Protein 7.6  6.0 - 8.3 g/dL   Albumin 3.6  3.5 - 5.2 g/dL   AST 21  0 - 37 U/L   ALT 16  0 - 53 U/L   Alkaline Phosphatase 69  39 - 117 U/L   Total Bilirubin 0.3  0.3 - 1.2 mg/dL   GFR calc non Af Amer >90  >90 mL/min   GFR calc Af Amer >90  >90 mL/min  D-DIMER, QUANTITATIVE      Component Value Range   D-Dimer, Quant 0.27  0.00 - 0.48 ug/mL-FEU  POCT I-STAT, CHEM 8      Component Value Range   Sodium 138  135 - 145 mEq/L   Potassium 3.9  3.5 - 5.1 mEq/L   Chloride 104  96 - 112 mEq/L   BUN 16  6 - 23 mg/dL   Creatinine, Ser 4.01  0.50 - 1.35 mg/dL   Glucose, Bld 027 (*) 70 - 99 mg/dL   Calcium, Ion 2.53  6.64 - 1.23 mmol/L   TCO2 26  0 - 100 mmol/L   Hemoglobin 16.0  13.0 - 17.0 g/dL   HCT 40.3  47.4 - 25.9 %    Dg Chest 2 View  01/13/2012  *RADIOLOGY REPORT*  Clinical Data: Productive cough.  Chest pain.  Shortness of breath and fever.  CHEST - 2 VIEW  Comparison: Portable chest 01/06/2012.  Findings: The heart size is normal.  Mild emphysematous changes are present.  No focal airspace disease is evident.  The visualized soft tissues and bony thorax are unremarkable.  IMPRESSION:  1.  Mild emphysematous changes. 2.  No acute cardiopulmonary disease.  Original Report Authenticated By: Jamesetta Orleans. MATTERN, M.D.   ED ECG REPORT   Date: 01/14/2012  EKG Time: 5:49 AM  Rate: 113  Rhythm: sinus tachycardia  Axis: normal  Intervals:none  ST&T Change:   Narrative Interpretation: No significant changes since 05/26/2002             MDM   Labs and imaging within normal limits. However with patient developing pleurisy and fever will treat with antibiotics as well as inhaler and steroids.  Advised return if no improvement in her to 4 days. Patient family voice understanding and are ready for discharge       Thomasene Lot, Cordelia Poche 01/13/12 2109  Thomasene Lot, PA-C 01/14/12 [phone]

## 2012-01-13 NOTE — ED Notes (Signed)
Pt reports having bronchitis x2-3 weeks. Pt reports sudden onset of left sided chest pain that is worse with breathing and coughing. Pt also reports shortness of breath.  Pt denies productive cough.  Pt has been using an inhaler he was given last week by the ER, but patient states it is now empty.

## 2012-01-13 NOTE — Discharge Instructions (Signed)
Use 1-2 puffs of inhaler every 4-6 hours.  Bronchitis Bronchitis is the body's way of reacting to injury and/or infection (inflammation) of the bronchi. Bronchi are the air tubes that extend from the windpipe into the lungs. If the inflammation becomes severe, it may cause shortness of breath. CAUSES  Inflammation may be caused by:  A virus.   Germs (bacteria).   Dust.   Allergens.   Pollutants and many other irritants.  The cells lining the bronchial tree are covered with tiny hairs (cilia). These constantly beat upward, away from the lungs, toward the mouth. This keeps the lungs free of pollutants. When these cells become too irritated and are unable to do their job, mucus begins to develop. This causes the characteristic cough of bronchitis. The cough clears the lungs when the cilia are unable to do their job. Without either of these protective mechanisms, the mucus would settle in the lungs. Then you would develop pneumonia. Smoking is a common cause of bronchitis and can contribute to pneumonia. Stopping this habit is the single most important thing you can do to help yourself. TREATMENT   Your caregiver may prescribe an antibiotic if the cough is caused by bacteria. Also, medicines that open up your airways make it easier to breathe. Your caregiver may also recommend or prescribe an expectorant. It will loosen the mucus to be coughed up. Only take over-the-counter or prescription medicines for pain, discomfort, or fever as directed by your caregiver.   Removing whatever causes the problem (smoking, for example) is critical to preventing the problem from getting worse.   Cough suppressants may be prescribed for relief of cough symptoms.   Inhaled medicines may be prescribed to help with symptoms now and to help prevent problems from returning.   For those with recurrent (chronic) bronchitis, there may be a need for steroid medicines.  SEEK IMMEDIATE MEDICAL CARE IF:   During  treatment, you develop more pus-like mucus (purulent sputum).   You have a fever.   Your baby is older than 3 months with a rectal temperature of 102 F (38.9 C) or higher.   Your baby is 29 months old or younger with a rectal temperature of 100.4 F (38 C) or higher.   You become progressively more ill.   You have increased difficulty breathing, wheezing, or shortness of breath.  It is necessary to seek immediate medical care if you are elderly or sick from any other disease. MAKE SURE YOU:   Understand these instructions.   Will watch your condition.   Will get help right away if you are not doing well or get worse.  Document Released: 06/07/2005 Document Revised: 05/27/2011 Document Reviewed: 04/16/2008 [facility] Patient Information 2012 [place], [place].

## 2012-01-14 NOTE — ED Provider Notes (Signed)
Medical screening examination/treatment/procedure(s) were performed by non-physician practitioner and as supervising physician I was immediately available for consultation/collaboration.   Celene Kras, MD 01/14/12 1321

## 2012-01-14 NOTE — ED Provider Notes (Signed)
Medical screening examination/treatment/procedure(s) were performed by non-physician practitioner and as supervising physician I was immediately available for consultation/collaboration.   Celene Kras, MD 01/14/12 [phone]

## 2012-05-30 ENCOUNTER — Emergency Department (HOSPITAL_COMMUNITY): Payer: Self-pay

## 2012-05-30 ENCOUNTER — Inpatient Hospital Stay (HOSPITAL_COMMUNITY)
Admission: EM | Admit: 2012-05-30 | Discharge: 2012-06-01 | DRG: 395 | Disposition: A | Discharge status: Home / Self Care | Payer: MEDICAID | Attending: Internal Medicine | Admitting: Internal Medicine

## 2012-05-30 ENCOUNTER — Encounter (HOSPITAL_COMMUNITY): Payer: Self-pay | Admitting: Unknown Physician Specialty

## 2012-05-30 DIAGNOSIS — I9589 Other hypotension: Secondary | ICD-10-CM | POA: Diagnosis present

## 2012-05-30 DIAGNOSIS — I200 Unstable angina: Secondary | ICD-10-CM

## 2012-05-30 DIAGNOSIS — Z720 Tobacco use: Secondary | ICD-10-CM | POA: Diagnosis present

## 2012-05-30 DIAGNOSIS — K4090 Unilateral inguinal hernia, without obstruction or gangrene, not specified as recurrent: Secondary | ICD-10-CM | POA: Insufficient documentation

## 2012-05-30 DIAGNOSIS — R197 Diarrhea, unspecified: Secondary | ICD-10-CM | POA: Diagnosis present

## 2012-05-30 DIAGNOSIS — K5730 Diverticulosis of large intestine without perforation or abscess without bleeding: Secondary | ICD-10-CM | POA: Diagnosis present

## 2012-05-30 DIAGNOSIS — I959 Hypotension, unspecified: Secondary | ICD-10-CM | POA: Diagnosis not present

## 2012-05-30 DIAGNOSIS — T463X5A Adverse effect of coronary vasodilators, initial encounter: Secondary | ICD-10-CM | POA: Diagnosis present

## 2012-05-30 DIAGNOSIS — R079 Chest pain, unspecified: Secondary | ICD-10-CM | POA: Diagnosis present

## 2012-05-30 DIAGNOSIS — F17200 Nicotine dependence, unspecified, uncomplicated: Secondary | ICD-10-CM | POA: Diagnosis present

## 2012-05-30 DIAGNOSIS — R109 Unspecified abdominal pain: Secondary | ICD-10-CM | POA: Diagnosis present

## 2012-05-30 DIAGNOSIS — Z23 Encounter for immunization: Secondary | ICD-10-CM

## 2012-05-30 DIAGNOSIS — R1031 Right lower quadrant pain: Secondary | ICD-10-CM | POA: Diagnosis present

## 2012-05-30 DIAGNOSIS — F1210 Cannabis abuse, uncomplicated: Secondary | ICD-10-CM | POA: Diagnosis present

## 2012-05-30 DIAGNOSIS — I252 Old myocardial infarction: Secondary | ICD-10-CM

## 2012-05-30 LAB — RAPID URINE DRUG SCREEN, HOSP PERFORMED
Amphetamines: NOT DETECTED
Tetrahydrocannabinol: POSITIVE — AB

## 2012-05-30 LAB — BASIC METABOLIC PANEL
CO2: 27 mEq/L (ref 19–32)
Calcium: 9 mg/dL (ref 8.4–10.5)
Chloride: 100 mEq/L (ref 96–112)
Glucose, Bld: 84 mg/dL (ref 70–99)
Potassium: 4.3 mEq/L (ref 3.5–5.1)
Sodium: 136 mEq/L (ref 135–145)

## 2012-05-30 LAB — CBC
Hemoglobin: 14.8 g/dL (ref 13.0–17.0)
MCH: 33.2 pg (ref 26.0–34.0)
RBC: 4.46 MIL/uL (ref 4.22–5.81)
WBC: 3.1 10*3/uL — ABNORMAL LOW (ref 4.0–10.5)

## 2012-05-30 LAB — POCT I-STAT TROPONIN I: Troponin i, poc: 0.01 ng/mL (ref 0.00–0.08)

## 2012-05-30 LAB — TROPONIN I: Troponin I: <0.3 ng/mL (ref <0.30)

## 2012-05-30 MED ORDER — MORPHINE SULFATE 4 MG/ML IJ SOLN
4.0000 mg | Freq: Once | INTRAMUSCULAR | Status: AC
  Administered 2012-05-30: 4 mg via INTRAVENOUS
  Filled 2012-05-30: qty 1

## 2012-05-30 MED ORDER — HYDROMORPHONE HCL PF 1 MG/ML IJ SOLN
1.0000 mg | Freq: Once | INTRAMUSCULAR | Status: AC
  Administered 2012-05-30: 1 mg via INTRAVENOUS
  Filled 2012-05-30: qty 1

## 2012-05-30 MED ORDER — ONDANSETRON HCL 4 MG PO TABS: 4.0000 mg | ORAL_TABLET | Freq: Four times a day (QID) | ORAL | Status: DC | PRN

## 2012-05-30 MED ORDER — SODIUM CHLORIDE 0.9 % IJ SOLN
3.0000 mL | Freq: Two times a day (BID) | INTRAMUSCULAR | Status: DC
  Administered 2012-05-30 – 2012-05-31 (×2): 3 mL via INTRAVENOUS

## 2012-05-30 MED ORDER — HEPARIN SODIUM (PORCINE) 5000 UNIT/ML IJ SOLN
4000.0000 [IU] | Freq: Once | INTRAMUSCULAR | Status: AC
  Administered 2012-05-30: 4000 [IU] via INTRAVENOUS
  Filled 2012-05-30: qty 1

## 2012-05-30 MED ORDER — ONDANSETRON HCL 4 MG/2ML IJ SOLN: 4.0000 mg | Freq: Four times a day (QID) | INTRAMUSCULAR | Status: DC | PRN

## 2012-05-30 MED ORDER — ASPIRIN EC 325 MG PO TBEC
325.0000 mg | DELAYED_RELEASE_TABLET | Freq: Every day | ORAL | Status: DC
Start: 2012-05-31 — End: 2012-05-31
  Administered 2012-05-31: 325 mg via ORAL
  Filled 2012-05-30: qty 1

## 2012-05-30 MED ORDER — NITROGLYCERIN IN D5W 200-5 MCG/ML-% IV SOLN
2.0000 ug/min | INTRAVENOUS | Status: DC
  Administered 2012-05-31: 5 ug/min via INTRAVENOUS

## 2012-05-30 MED ORDER — HEPARIN (PORCINE) IN NACL 100-0.45 UNIT/ML-% IJ SOLN
1200.0000 [IU]/h | INTRAMUSCULAR | Status: DC
  Administered 2012-05-31: 1200 [IU]/h via INTRAVENOUS
  Filled 2012-05-30 (×2): qty 250

## 2012-05-30 MED ORDER — ASPIRIN 325 MG PO TABS: 325.0000 mg | ORAL_TABLET | Freq: Once | ORAL | Status: DC

## 2012-05-30 MED ORDER — ACETAMINOPHEN 325 MG PO TABS
650.0000 mg | ORAL_TABLET | Freq: Four times a day (QID) | ORAL | Status: DC | PRN
  Administered 2012-05-31: 650 mg via ORAL
  Filled 2012-05-30: qty 2

## 2012-05-30 MED ORDER — NITROGLYCERIN 0.4 MG/HR TD PT24
0.4000 mg | MEDICATED_PATCH | Freq: Every day | TRANSDERMAL | Status: DC
  Filled 2012-05-30: qty 1

## 2012-05-30 MED ORDER — SODIUM CHLORIDE 0.9 % IV SOLN
INTRAVENOUS | Status: DC
  Administered 2012-05-31 (×2): via INTRAVENOUS

## 2012-05-30 MED ORDER — ACETAMINOPHEN 650 MG RE SUPP: 650.0000 mg | Freq: Four times a day (QID) | RECTAL | Status: DC | PRN

## 2012-05-30 MED ORDER — NITROGLYCERIN IN D5W 200-5 MCG/ML-% IV SOLN: 2.0000 ug/min | Freq: Once | INTRAVENOUS | Status: DC

## 2012-05-30 MED ORDER — NITROGLYCERIN IN D5W 200-5 MCG/ML-% IV SOLN
2.0000 ug/min | Freq: Once | INTRAVENOUS | Status: AC
  Administered 2012-05-30: 5 ug/min via INTRAVENOUS
  Filled 2012-05-30: qty 250

## 2012-05-30 NOTE — H&P (Addendum)
Sean Martinez is an 51 y.o. male.   Patient was seen and examined on May 30, 2012. PCP - none. Patient just moved from [place] 3 weeks ago. Chief Complaint: Chest pain. HPI: 51 year old male presented the ER because of chest pain. Today around noontime patient started out take some groin pain the right side. While taking shower he started developing chest pressure retrosternal nonradiating with some mild associated shortness of breath which persisted and at that point he decided to come to the ER. In the ER he was initially given morphine and later Dilaudid after which his pressure improved but still persist. EKG was showing some anterior lead T-wave changes but cardiac enzymes and chest x-ray on remarkable. Cardiologist on call Dr. Mayford Knife was consulted at this time hospitalist has been requested for admission. Patient's right groin pain worsens on standing and on palpation. Denies any nausea vomiting but has been having some diarrhea for last 2 weeks which was nonbloody and has at least 3-4 episodes a day.  Past Medical History  Diagnosis Date  . MI, old     Past Surgical History  Procedure Date  . Hernia repair     Family History  Problem Relation Age of Onset  . Diabetes Mellitus II Mother   . Diabetes Mellitus II Father    Social History:  reports that he has been smoking Cigarettes.  He does not have any smokeless tobacco history on file. He reports that he drinks alcohol. He reports that he uses illicit drugs (Marijuana).  Allergies:  Allergies  Allergen Reactions  . Penicillins Hives     (Not in a hospital admission)  Results for orders placed during the hospital encounter of 05/30/12 (from the past 48 hour(s))  CBC     Status: Abnormal   Collection Time   05/30/12  6:07 PM      Component Value Range Comment   WBC 3.1 (*) 4.0 - 10.5 K/uL    RBC 4.46  4.22 - 5.81 MIL/uL    Hemoglobin 14.8  13.0 - 17.0 g/dL    HCT 16.1  09.6 - 04.5 %    MCV 91.9  78.0 - 100.0 fL    MCH 33.2  26.0 - 34.0 pg    MCHC 36.1 (*) 30.0 - 36.0 g/dL    RDW 40.9  81.1 - 91.4 %    Platelets 151  150 - 400 K/uL   BASIC METABOLIC PANEL     Status: Normal   Collection Time   05/30/12  6:07 PM      Component Value Range Comment   Sodium 136  135 - 145 mEq/L    Potassium 4.3  3.5 - 5.1 mEq/L    Chloride 100  96 - 112 mEq/L    CO2 27  19 - 32 mEq/L    Glucose, Bld 84  70 - 99 mg/dL    BUN 14  6 - 23 mg/dL    Creatinine, Ser 7.82  0.50 - 1.35 mg/dL    Calcium 9.0  8.4 - 95.6 mg/dL    GFR calc non Af Amer >90  >90 mL/min    GFR calc Af Amer >90  >90 mL/min   TROPONIN I     Status: Normal   Collection Time   05/30/12  6:07 PM      Component Value Range Comment   Troponin I <0.30  <0.30 ng/mL   POCT I-STAT TROPONIN I     Status: Normal   Collection Time  05/30/12  7:54 PM      Component Value Range Comment   Troponin i, poc 0.01  0.00 - 0.08 ng/mL    Comment 3             Dg Chest 2 View  05/30/2012  *RADIOLOGY REPORT*  Clinical Data: Chest pain  CHEST - 2 VIEW  Comparison: 04/14/2012  Findings: Chronic interstitial markings.  No focal consolidation. No pleural effusion or pneumothorax.  Very mild apparent nodularity in the right mid lung is favored to reflect a pulmonary vessel.  Cardiomediastinal silhouette is within normal limits.  Mild degenerative changes of the visualized thoracolumbar spine.  IMPRESSION: No evidence of acute cardiopulmonary disease.   Original Report Authenticated By: Charline Bills, M.D.     Review of Systems  Constitutional: Negative.   HENT: Negative.   Eyes: Negative.   Respiratory: Negative.   Cardiovascular: Positive for chest pain.  Gastrointestinal: Positive for abdominal pain and diarrhea.  Genitourinary: Negative.   Musculoskeletal: Negative.   Skin: Negative.   Neurological: Negative.   Endo/Heme/Allergies: Negative.   Psychiatric/Behavioral: Negative.     Blood pressure 118/82, pulse 76, temperature 98.1 F (36.7 C),  temperature source Oral, resp. rate 24, SpO2 98.00%. Physical Exam  Constitutional: He is oriented to person, place, and time. He appears well-developed and well-nourished. No distress.  HENT:  Head: Normocephalic and atraumatic.  Right Ear: External ear normal.  Left Ear: External ear normal.  Nose: Nose normal.  Mouth/Throat: Oropharynx is clear and moist. No oropharyngeal exudate.  Eyes: Conjunctivae normal are normal. Pupils are equal, round, and reactive to light. Right eye exhibits no discharge. Left eye exhibits no discharge. No scleral icterus.  Neck: Normal range of motion. Neck supple.  Cardiovascular: Normal rate and regular rhythm.   Respiratory: Effort normal and breath sounds normal. No respiratory distress. He has no wheezes. He has no rales.  GI: Soft. Bowel sounds are normal. He exhibits no distension. There is tenderness (right groin area.). There is no rebound and no guarding.  Musculoskeletal: He exhibits no edema and no tenderness.  Neurological: He is alert and oriented to person, place, and time.       Moves all extremities.  Skin: Skin is warm and dry. He is not diaphoretic.     Assessment/Plan #1. Chest pain - patient states he has had an MI 3 years ago and has had a cardiac catheter which as per patient was normal. At this time we will cycle cardiac markers check 2-D echo and check d-dimer. Aspirin and nitroglycerin patch.Check drug screen. #2. Right groin pain with diarrhea - check CT abdomen pelvis. Check stool for C. difficile. Since patient has leukopenia with diarrhea we'll check HIV. #3. Tobacco abuse - advised to quit smoking.  CODE STATUS - full code.  [name] 05/30/2012, 9:26 PM

## 2012-05-30 NOTE — ED Notes (Signed)
Called report to [phone].

## 2012-05-30 NOTE — ED Notes (Signed)
Patient arrived via EMS with chest pain that developed while he was sitting watching TV when chest pain started with shortness of breath. Denies diaphoresis and nausea.

## 2012-05-30 NOTE — ED Notes (Signed)
Patient received Asprin 324mg , nitro SL x4 given by EMS.

## 2012-05-30 NOTE — Progress Notes (Signed)
ANTICOAGULATION CONSULT NOTE - Initial Consult  Pharmacy Consult for  Heparin Indication: chest pain/ACS  Allergies  Allergen Reactions  . Penicillins Hives    Patient Measurements:   Heparin Dosing Weight: 85.9 kg  Vital Signs: Temp: 98.1 F (36.7 C) (12/10 1730) Temp src: Oral (12/10 1730) BP: 118/82 mmHg (12/10 1858) Pulse Rate: 76  (12/10 1858)  Labs:  Basename 05/30/12 1807  HGB 14.8  HCT 41.0  PLT 151  APTT --  LABPROT --  INR --  HEPARINUNFRC --  CREATININE 0.77  CKTOTAL --  CKMB --  TROPONINI <0.30    CrCl is unknown because there is no height on file for the current visit.   Medical History: Past Medical History  Diagnosis Date  . MI, old     Medications:  Scheduled:    . heparin  4,000 Units Intravenous Once  . [COMPLETED]  HYDROmorphone (DILAUDID) injection  1 mg Intravenous Once  . [COMPLETED]  morphine injection  4 mg Intravenous Once  . nitroGLYCERIN  2-200 mcg/min Intravenous Once  . [DISCONTINUED] aspirin  325 mg Oral Once    Assessment: 51 yr old male with history of previous MI, presented to the ED with sudden onset chest pain while watching TV. Pt has moved from [place] and hasn't seen a new doctor or gotten his prescriptions for his diabetes, etc. Goal of Therapy:  Heparin level 0.3-0.7 units/ml Monitor platelets by anticoagulation protocol: Yes   Plan:  Heparin bolus 4000 units then heparin drip at 1200 units/hr. Check heparin level and cbc with AM labs.  Sean Martinez 05/30/2012,9:37 PM

## 2012-05-30 NOTE — ED Notes (Signed)
Called report to [phone]. 

## 2012-05-30 NOTE — ED Provider Notes (Addendum)
History     CSN: [id number]  Arrival date & time 05/30/12  1723   First MD Initiated Contact with Patient 05/30/12 1724      Chief Complaint  Patient presents with  . Chest Pain    (Consider location/radiation/quality/duration/timing/severity/associated sxs/prior treatment) HPI Comments: 51 year old male with a history of an MI presents to the emergency department complaining of sudden onset chest pain while he was sitting watching TV around 3:30 this afternoon. Prior to the chest pain he was in the shower when his right inguinal hernia popped out and he was pushing it back in. He then went to sit and watch TV with chest pain began. The pain has increased since it started, now rated 8/10. He describes the pain as sharp, constant, located on the left side of his chest and nonradiating. Admits to associated mild shortness of breath. No nausea, vomiting or diaphoresis. States this feels similar to his previous chest pain when he had heart attack, however not as bad. He was catheterized in his previous heart attack in [place], but denies having stent placement. He does not have a primary care physician as he just recently moved back from [place]. He does not take medications at this time for his past diabetes or other medical issues. He was given an aspirin and four sublingual nitroglycerin on EMS prior to arrival without any change in his pain.  Patient is a 51 y.o. male presenting with chest pain. The history is provided by the patient.  Chest Pain Primary symptoms include shortness of breath. Pertinent negatives for primary symptoms include no fever, no nausea, no vomiting and no dizziness.  Pertinent negatives for associated symptoms include no diaphoresis and no weakness.     Past Medical History  Diagnosis Date  . MI, old     Past Surgical History  Procedure Date  . Hernia repair     No family history on file.  History  Substance Use Topics  . Smoking status: Current Every  Day Smoker    Types: Cigarettes  . Smokeless tobacco: Not on file  . Alcohol Use: Yes     Comment: occ      Review of Systems  Constitutional: Negative for fever, chills and diaphoresis.  HENT: Negative for neck pain and neck stiffness.   Eyes: Negative for visual disturbance.  Respiratory: Positive for shortness of breath.   Cardiovascular: Positive for chest pain.  Gastrointestinal: Negative for nausea, vomiting and constipation.  Genitourinary: Negative for scrotal swelling, difficulty urinating and testicular pain.  Musculoskeletal: Negative for back pain.  Skin: Negative.   Neurological: Negative for dizziness, weakness, light-headedness and headaches.  Psychiatric/Behavioral: Negative for confusion.    Allergies  Penicillins  Home Medications  No current outpatient prescriptions on file.  BP 124/83  Resp 20  SpO2 97%  Physical Exam  Nursing note and vitals reviewed. Constitutional: He is oriented to person, place, and time. He appears well-developed and well-nourished. No distress.  HENT:  Head: Normocephalic and atraumatic.  Mouth/Throat: Oropharynx is clear and moist.  Eyes: Conjunctivae normal and EOM are normal. Pupils are equal, round, and reactive to light.  Neck: Normal range of motion. Neck supple. No JVD present.  Cardiovascular: Normal rate, regular rhythm, normal heart sounds and intact distal pulses.        No extremity edema.  Pulmonary/Chest: Effort normal and breath sounds normal. No respiratory distress. He has no wheezes. He has no rales. He exhibits no tenderness.  Abdominal: Soft. Bowel sounds are normal.  There is no tenderness. A hernia is present. Hernia confirmed positive in the right inguinal area (present when standing, able to be pushed in, disappears when laying down).  Musculoskeletal: Normal range of motion. He exhibits no edema.  Neurological: He is alert and oriented to person, place, and time.  Skin: Skin is warm and dry. He is not  diaphoretic.  Psychiatric: He has a normal mood and affect. His behavior is normal.    ED Course  Procedures (including critical care time)  Date: 05/30/2012  Rate: 75  Rhythm: normal sinus rhythm  QRS Axis: normal  Intervals: normal  ST/T Wave abnormalities: normal  Conduction Disutrbances:none  Narrative Interpretation: normal EKG, no stemi  Old EKG Reviewed: unchanged   Labs Reviewed  CBC - Abnormal; Notable for the following:    WBC 3.1 (*)     MCHC 36.1 (*)     All other components within normal limits  BASIC METABOLIC PANEL  TROPONIN I  POCT I-STAT TROPONIN I   Dg Chest 2 View  05/30/2012  *RADIOLOGY REPORT*  Clinical Data: Chest pain  CHEST - 2 VIEW  Comparison: 04/14/2012  Findings: Chronic interstitial markings.  No focal consolidation. No pleural effusion or pneumothorax.  Very mild apparent nodularity in the right mid lung is favored to reflect a pulmonary vessel.  Cardiomediastinal silhouette is within normal limits.  Mild degenerative changes of the visualized thoracolumbar spine.  IMPRESSION: No evidence of acute cardiopulmonary disease.   Original Report Authenticated By: Charline Bills, M.D.      1. Unstable angina       MDM  51 y/o male pmhx of MI with unstable angina. Troponin negative. Labs unremarkable. Pain not improved with morphine. Pain improved with dilaudid to 5/10. Spoke with Dr. Mayford Knife with cardiology who will see patient in the morning and advised patient to be admitted to hospitalist. Patient admitted to triad hospitalist Dr. Wynona Dove. Heparin/nitro drips started.    CRITICAL CARE Performed by: Johnnette Gourd   Total critical care time: 31  Critical care time was exclusive of separately billable procedures and treating other patients.  Critical care was necessary to treat or prevent imminent or life-threatening deterioration.  Critical care was time spent personally by me on the following activities: development of treatment plan  with patient and/or surrogate as well as nursing, discussions with consultants, evaluation of patient's response to treatment, examination of patient, obtaining history from patient or surrogate, ordering and performing treatments and interventions, ordering and review of laboratory studies, ordering and review of radiographic studies, pulse oximetry and re-evaluation of patient's condition.    Trevor Mace, PA-C 05/30/12 2120  Trevor Mace, PA-C 05/30/12 2124  Trevor Mace, PA-C 05/31/12 [phone]

## 2012-05-31 ENCOUNTER — Observation Stay (HOSPITAL_COMMUNITY): Payer: Self-pay

## 2012-05-31 DIAGNOSIS — I959 Hypotension, unspecified: Secondary | ICD-10-CM | POA: Diagnosis not present

## 2012-05-31 DIAGNOSIS — R072 Precordial pain: Secondary | ICD-10-CM

## 2012-05-31 LAB — CBC
Hemoglobin: 14.6 g/dL (ref 13.0–17.0)
MCH: 33.2 pg (ref 26.0–34.0)
Platelets: 170 10*3/uL (ref 150–400)
RBC: 4.4 MIL/uL (ref 4.22–5.81)

## 2012-05-31 LAB — BASIC METABOLIC PANEL
Chloride: 104 mEq/L (ref 96–112)
GFR calc Af Amer: >90 mL/min (ref >90)
GFR calc non Af Amer: >90 mL/min (ref >90)
Glucose, Bld: 84 mg/dL (ref 70–99)
Potassium: 4.3 mEq/L (ref 3.5–5.1)
Sodium: 139 mEq/L (ref 135–145)

## 2012-05-31 LAB — D-DIMER, QUANTITATIVE: D-Dimer, Quant: <0.27 ug/mL-FEU (ref 0.00–0.48)

## 2012-05-31 LAB — TROPONIN I
Troponin I: <0.3 ng/mL (ref <0.30)
Troponin I: <0.3 ng/mL (ref <0.30)

## 2012-05-31 LAB — MRSA PCR SCREENING: MRSA by PCR: NEGATIVE

## 2012-05-31 LAB — HEPARIN LEVEL (UNFRACTIONATED)
Heparin Unfractionated: 0.1 IU/mL — ABNORMAL LOW (ref 0.30–0.70)
Heparin Unfractionated: 0.24 IU/mL — ABNORMAL LOW (ref 0.30–0.70)

## 2012-05-31 MED ORDER — ASPIRIN EC 81 MG PO TBEC
81.0000 mg | DELAYED_RELEASE_TABLET | Freq: Every day | ORAL | Status: DC
Start: 2012-06-01 — End: 2012-06-01
  Administered 2012-06-01: 81 mg via ORAL
  Filled 2012-05-31: qty 1

## 2012-05-31 MED ORDER — IOHEXOL 300 MG/ML  SOLN
20.0000 mL | INTRAMUSCULAR | Status: AC
  Administered 2012-05-31 (×2): 20 mL via ORAL

## 2012-05-31 MED ORDER — IOHEXOL 300 MG/ML  SOLN
80.0000 mL | Freq: Once | INTRAMUSCULAR | Status: AC | PRN
  Administered 2012-05-31: 80 mL via INTRAVENOUS

## 2012-05-31 MED ORDER — PNEUMOCOCCAL VAC POLYVALENT 25 MCG/0.5ML IJ INJ
0.5000 mL | INJECTION | INTRAMUSCULAR | Status: DC
  Filled 2012-05-31: qty 0.5

## 2012-05-31 MED ORDER — GI COCKTAIL ~~LOC~~
30.0000 mL | Freq: Three times a day (TID) | ORAL | Status: DC | PRN
  Filled 2012-05-31: qty 30

## 2012-05-31 MED ORDER — HEPARIN SODIUM (PORCINE) 5000 UNIT/ML IJ SOLN
5000.0000 [IU] | Freq: Three times a day (TID) | INTRAMUSCULAR | Status: DC
  Administered 2012-05-31 (×2): 5000 [IU] via SUBCUTANEOUS
  Filled 2012-05-31 (×6): qty 1

## 2012-05-31 MED ORDER — ZOLPIDEM TARTRATE 5 MG PO TABS
10.0000 mg | ORAL_TABLET | Freq: Once | ORAL | Status: AC
  Administered 2012-05-31: 10 mg via ORAL
  Filled 2012-05-31: qty 2

## 2012-05-31 MED ORDER — PANTOPRAZOLE SODIUM 40 MG PO TBEC
40.0000 mg | DELAYED_RELEASE_TABLET | Freq: Every day | ORAL | Status: DC
  Administered 2012-05-31: 40 mg via ORAL
  Filled 2012-05-31: qty 1

## 2012-05-31 MED ORDER — IBUPROFEN 600 MG PO TABS
600.0000 mg | ORAL_TABLET | Freq: Four times a day (QID) | ORAL | Status: DC | PRN
  Filled 2012-05-31: qty 1

## 2012-05-31 MED ORDER — INFLUENZA VIRUS VACC SPLIT PF IM SUSP
0.5000 mL | INTRAMUSCULAR | Status: DC
  Filled 2012-05-31: qty 0.5

## 2012-05-31 MED ORDER — NICOTINE 21 MG/24HR TD PT24
21.0000 mg | MEDICATED_PATCH | Freq: Every day | TRANSDERMAL | Status: DC
  Administered 2012-05-31 – 2012-06-01 (×2): 21 mg via TRANSDERMAL
  Filled 2012-05-31 (×2): qty 1

## 2012-05-31 MED ORDER — HEPARIN BOLUS VIA INFUSION
2000.0000 [IU] | Freq: Once | INTRAVENOUS | Status: AC
  Administered 2012-05-31: 2000 [IU] via INTRAVENOUS
  Filled 2012-05-31: qty 2000

## 2012-05-31 NOTE — ED Provider Notes (Signed)
Medical screening examination/treatment/procedure(s) were performed by non-physician practitioner and as supervising physician I was immediately available for consultation/collaboration.  Flint Melter, MD 05/31/12 [phone]

## 2012-05-31 NOTE — Progress Notes (Signed)
[facility] Progress Note [place] TEAM 1 - Stepdown/ICU TEAM   CAMBREN HELM RUE:[id number] DOB: May 24, 1961 DOA: 05/30/2012 PCP: Sheila Oats, MD  Brief narrative: 51 year old male who presented the ER because of chest pain. Patient initially noted some groin pain on the right side. While taking a shower he started developing chest pressure retrosternal nonradiating with some mild associated shortness of breath which persisted.  In the ER he was initially given morphine and later Dilaudid after which his pressure improved but still persisted. EKG revealed anterior lead T-wave changes but cardiac enzymes and chest x-ray were remarkable. Cardiologist on call Dr. Mayford Knife was consulted, but apparently did not see the pt in person. Patient's right groin pain worsened on standing and on palpation. Denied any nausea vomiting but had been having 3-4 episodes of diarrhea for last 2 weeks which was nonbloody.  Assessment/Plan:  Chest / epigastric pain - atypical Troponin negative x3 - d-dimer is negative - EKG is without acute change - risk factors modest (+tob, no HTN, unknown lipids, no DM, no fam hx) - pt reports having "clean" cath in [place] 3 years ago - sx are not c/w angina (constant since onset - reproducible with palpation) - ?GI source - pt reports signif indigestion - will give trial of protonix and check H pylori abx   Right groin pain / right inguinal hernia CT scan reveals no evidence of incarceration and suggests only preperitoneal fat within the hernia - exam w/o worrisome findings - counseled pt on incarcerated hernia sx and need for immediate medical attention should these sx develop  Diarrhea White count is normal - no fever - appears to have resolved at this time - watch for recurrence - low risk profile for C diff  Tobacco abuse Counseled on abstinence - nicotine patch  Marijuana abuse Urine drug screen positive - counseled on abstinence  Diffuse  diverticulosis Noted on CT scan - no evidence of diverticulitis   Hypotension Patient suffered with hypotension during the night - this was likely simply secondary to nitroglycerin dosing - I have discontinued his transdermal nitroglycerin and a nitroglycerin drip - we will watch over night and begin to ambulate  Code Status: Full Disposition Plan: transfer to medical bed - possible D/C in AM  Consultants: None  Procedures: None  Antibiotics: None  DVT prophylaxis: IV heparin  HPI/Subjective: Patient reports continued upper left chest and xiphoid area pain.  He states it has not resolved since its onset yesterday.  It does not change with exertion.  He admits to significant indigestion type symptoms for which he frequently takes Maalox.  He denies significant pain in his right groin.  He denies shortness of breath headache fevers chills nausea or vomiting.   Objective: Blood pressure 120/96, pulse 70, temperature 98.4 F (36.9 C), temperature source Oral, resp. rate 19, height 6' (1.829 m), weight 73.1 kg (161 lb 2.5 oz), SpO2 95.00%.  Intake/Output Summary (Last 24 hours) at 05/31/12 1044 Last data filed at 05/31/12 0700  Gross per 24 hour  Intake  931.5 ml  Output    500 ml  Net  431.5 ml     Exam: General: No acute respiratory distress Lungs: Clear to auscultation bilaterally without wheezes or crackles Cardiovascular: Regular rate and rhythm without murmur gallop or rub  Abdomen: Nontender, nondistended, soft, bowel sounds positive, no rebound, no ascites, no appreciable mass GU:  Exam reveals no significant evidence of indirect or direct inguinal hernia at this time Extremities: No significant cyanosis, clubbing,  or edema bilateral lower extremities  Data Reviewed: Basic Metabolic Panel:  Lab 05/31/12 1610 05/30/12 1807  NA 139 136  K 4.3 4.3  CL 104 100  CO2 26 27  GLUCOSE 84 84  BUN 18 14  CREATININE 0.87 0.77  CALCIUM 9.5 9.0  MG -- --  PHOS -- --    CBC:  Lab 05/31/12 0415 05/30/12 1807  WBC 3.8* 3.1*  NEUTROABS -- --  HGB 14.6 14.8  HCT 41.0 41.0  MCV 93.2 91.9  PLT 170 151   Cardiac Enzymes:  Lab 05/31/12 0415 05/30/12 2317 05/30/12 1807  CKTOTAL -- -- --  CKMB -- -- --  CKMBINDEX -- -- --  TROPONINI <0.30 <0.30 <0.30    Recent Results (from the past 240 hour(s))  MRSA PCR SCREENING     Status: Normal   Collection Time   05/30/12 10:46 PM      Component Value Range Status Comment   MRSA by PCR NEGATIVE  NEGATIVE Final      Studies:  Recent x-ray studies have been reviewed in detail by the Attending Physician  Scheduled Meds:  Reviewed in detail by the Attending Physician   Lonia Blood, MD [facility] Office  [phone] Pager [phone]  On-Call/Text Page:      [email or web address]      password [email or web address]  If 7PM-7AM, please contact night-coverage [email or web address] Password [email or web address] 05/31/2012, 10:44 AM   LOS: 1 day

## 2012-05-31 NOTE — Progress Notes (Signed)
  Echocardiogram 2D Echocardiogram has been performed.  Sean Martinez 05/31/2012, 1:11 PM

## 2012-05-31 NOTE — Progress Notes (Signed)
Report called to [facility] receiving RN on  [address] V[facility]. Transferred to bed 5 via wheelchair with personal belongings.   [facility]

## 2012-05-31 NOTE — Progress Notes (Signed)
ANTICOAGULATION CONSULT NOTE - Initial Consult  Pharmacy Consult for heparin Indication: chest pain/ACS  Labs:  [facility] 05/31/12 0415 05/30/12 2317 05/30/12 1807  HGB 14.6 -- 14.8  HCT 41.0 -- 41.0  PLT 170 -- 151  APTT -- -- --  LABPROT -- -- --  INR -- -- --  HEPARINUNFRC 0.10* -- --  CREATININE 0.87 -- 0.77  CKTOTAL -- -- --  CKMB -- -- --  TROPONINI <0.30 <0.30 <0.30    Assessment: 51yo male subtherapeutic on heparin with initial dosing for CP; of note, pt received bolus in ED but gtt wasn't running when he arrived to 3300, RN needed to get new IV access, gtt not started until 0220 (level drawn 2hr later).  Goal of Therapy:  Heparin level 0.3-0.7 units/ml   Plan:  Will rebolus with heparin 2000 units x1 given time since initial bolus but will continue gtt at current rate since level is not representative of current rate.  Colleen Can PharmD BCPS 05/31/2012,5:42 AM

## 2012-05-31 NOTE — ED Provider Notes (Signed)
Medical screening examination/treatment/procedure(s) were performed by non-physician practitioner and as supervising physician I was immediately available for consultation/collaboration.  Flint Melter, MD 05/31/12 [phone]

## 2012-05-31 NOTE — Care Management Note (Signed)
    Page 1 of 1   05/31/2012     12:05:06 PM   CARE MANAGEMENT NOTE 05/31/2012  Patient:  Sean Martinez, Sean Martinez   Account Number:  [id number]  Date Initiated:  05/31/2012  Documentation initiated by:  Donn Pierini  Subjective/Objective Assessment:   Pt admitted with chest pain     Action/Plan:   PTA pt lived at home- anticipate return home in am- NCM to follow   Anticipated DC Date:  06/01/2012   Anticipated DC Plan:  HOME/SELF CARE      DC Planning Services  CM consult      Choice offered to / List presented to:             Status of service:  In process, will continue to follow Medicare Important Message given?   (If response is "NO", the following Medicare IM given date fields will be blank) Date Medicare IM given:   Date Additional Medicare IM given:    Discharge Disposition:    Per UR Regulation:  Reviewed for med. necessity/level of care/duration of stay  If discussed at Long Length of Stay Meetings, dates discussed:    Comments:

## 2012-05-31 NOTE — Progress Notes (Signed)
Utilization review completed.  

## 2012-06-01 DIAGNOSIS — I200 Unstable angina: Secondary | ICD-10-CM

## 2012-06-01 LAB — H. PYLORI ANTIBODY, IGG: H Pylori IgG: <0.4 {ISR}

## 2012-06-01 MED ORDER — ASPIRIN 81 MG PO TBEC: 81.0000 mg | DELAYED_RELEASE_TABLET | Freq: Every day | ORAL | Status: DC

## 2012-06-01 MED ORDER — NICOTINE 21 MG/24HR TD PT24: 1.0000 | MEDICATED_PATCH | TRANSDERMAL | Status: DC

## 2012-06-01 MED ORDER — PANTOPRAZOLE SODIUM 40 MG PO TBEC: 40.0000 mg | DELAYED_RELEASE_TABLET | Freq: Every day | ORAL | Status: DC

## 2012-06-01 MED ORDER — GI COCKTAIL ~~LOC~~: 30.0000 mL | Freq: Three times a day (TID) | ORAL | Status: DC | PRN

## 2012-06-01 NOTE — Progress Notes (Signed)
DC HOME WITH PARENTS, VERBALLY UNDERSTOOD DC INSTRUCTIONS, NO QUESTIONS ASKED.

## 2012-06-01 NOTE — Discharge Summary (Signed)
Physician Discharge Summary  Sean Martinez MRN: [id number] DOB/AGE: 51-Aug-1962 51 y.o.  PCP: Sheila Oats, MD   Admit date: 05/30/2012 Discharge date: 06/01/2012  Discharge Diagnoses:     *Chest pain Active Problems:  Right groin pain  Diarrhea  Tobacco abuse  Hypotension, unspecified     Medication List     As of 06/01/2012  2:15 PM    TAKE these medications         aspirin 81 MG EC tablet   Take 1 tablet (81 mg total) by mouth daily.      gi cocktail Susp suspension   Take 30 mLs by mouth 3 (three) times daily as needed for indigestion. Shake well.      nicotine 21 mg/24hr patch   Commonly known as: NICODERM CQ - dosed in mg/24 hours   Place 1 patch onto the skin daily.      pantoprazole 40 MG tablet   Commonly known as: PROTONIX   Take 1 tablet (40 mg total) by mouth daily at 12 noon.        Discharge Condition: Stable Disposition: 01-Home or Self Care   Consults: None  Significant Diagnostic Studies: Dg Chest 2 View  05/30/2012  *RADIOLOGY REPORT*  Clinical Data: Chest pain  CHEST - 2 VIEW  Comparison: 04/14/2012  Findings: Chronic interstitial markings.  No focal consolidation. No pleural effusion or pneumothorax.  Very mild apparent nodularity in the right mid lung is favored to reflect a pulmonary vessel.  Cardiomediastinal silhouette is within normal limits.  Mild degenerative changes of the visualized thoracolumbar spine.  IMPRESSION: No evidence of acute cardiopulmonary disease.   Original Report Authenticated By: Charline Bills, M.D.    Ct Abdomen Pelvis W Contrast  05/31/2012  *RADIOLOGY REPORT*  Clinical Data: Right lower quadrant pain.  CT ABDOMEN AND PELVIS WITH CONTRAST  Technique:  Multidetector CT imaging of the abdomen and pelvis was performed following the standard protocol during bolus administration of intravenous contrast.  Contrast: 80mL OMNIPAQUE IOHEXOL 300 MG/ML  SOLN  Comparison: 09/17/2011  Findings: No focal  abnormalities seen in the liver or spleen.  The stomach, duodenum, pancreas, gallbladder, adrenal glands, and kidneys have normal imaging features.  No abdominal aortic aneurysm.  No lymphadenopathy or free fluid in the abdomen  Imaging through the pelvis shows no free intraperitoneal fluid. There is no pelvic sidewall lymphadenopathy.  Bladder is nondistended.  Prostate gland is mildly enlarged with associated dystrophic calcification.  Diffuse diverticular changes seen throughout the length of the colon without evidence for diverticulitis.  The terminal ileum is normal.  The appendix is normal.  Right inguinal hernia again identified, containing only preperitoneal fat.  No evidence for edema or fluid within the hernia sac to suggest fatty infarction or incarceration.  Bone windows reveal no worrisome lytic or sclerotic osseous lesions.  IMPRESSION: Right inguinal hernia without evidence for incarceration or infarction of the herniated fat.  Otherwise unremarkable exam.   Original Report Authenticated By: Kennith Center, M.D.      2-D echo LV EF: 45% - 50%  ------------------------------------------------------------ Indications: Chest pain 786.51.  ------------------------------------------------------------ History: Risk factors: Current tobacco use.  ------------------------------------------------------------ Study Conclusions  Left ventricle: The cavity size was normal. Wall thickness was normal. Systolic function was mildly reduced. The estimated ejection fraction was in the range of 45% to 50%.    Microbiology: Recent Results (from the past 240 hour(s))  MRSA PCR SCREENING     Status: Normal   Collection Time  05/30/12 10:46 PM      Component Value Range Status Comment   MRSA by PCR NEGATIVE  NEGATIVE Final      Labs: Results for orders placed during the hospital encounter of 05/30/12 (from the past 48 hour(s))  CBC     Status: Abnormal   Collection Time   05/30/12  6:07 PM       Component Value Range Comment   WBC 3.1 (*) 4.0 - 10.5 K/uL    RBC 4.46  4.22 - 5.81 MIL/uL    Hemoglobin 14.8  13.0 - 17.0 g/dL    HCT 16.1  09.6 - 04.5 %    MCV 91.9  78.0 - 100.0 fL    MCH 33.2  26.0 - 34.0 pg    MCHC 36.1 (*) 30.0 - 36.0 g/dL    RDW 40.9  81.1 - 91.4 %    Platelets 151  150 - 400 K/uL   BASIC METABOLIC PANEL     Status: Normal   Collection Time   05/30/12  6:07 PM      Component Value Range Comment   Sodium 136  135 - 145 mEq/L    Potassium 4.3  3.5 - 5.1 mEq/L    Chloride 100  96 - 112 mEq/L    CO2 27  19 - 32 mEq/L    Glucose, Bld 84  70 - 99 mg/dL    BUN 14  6 - 23 mg/dL    Creatinine, Ser 7.82  0.50 - 1.35 mg/dL    Calcium 9.0  8.4 - 95.6 mg/dL    GFR calc non Af Amer >90  >90 mL/min    GFR calc Af Amer >90  >90 mL/min   TROPONIN I     Status: Normal   Collection Time   05/30/12  6:07 PM      Component Value Range Comment   Troponin I <0.30  <0.30 ng/mL   POCT I-STAT TROPONIN I     Status: Normal   Collection Time   05/30/12  7:54 PM      Component Value Range Comment   Troponin i, poc 0.01  0.00 - 0.08 ng/mL    Comment 3            MRSA PCR SCREENING     Status: Normal   Collection Time   05/30/12 10:46 PM      Component Value Range Comment   MRSA by PCR NEGATIVE  NEGATIVE   URINE RAPID DRUG SCREEN (HOSP PERFORMED)     Status: Abnormal   Collection Time   05/30/12 10:47 PM      Component Value Range Comment   Opiates NONE DETECTED  NONE DETECTED    Cocaine NONE DETECTED  NONE DETECTED    Benzodiazepines NONE DETECTED  NONE DETECTED    Amphetamines NONE DETECTED  NONE DETECTED    Tetrahydrocannabinol POSITIVE (*) NONE DETECTED    Barbiturates NONE DETECTED  NONE DETECTED   D-DIMER, QUANTITATIVE     Status: Normal   Collection Time   05/30/12 11:17 PM      Component Value Range Comment   D-Dimer, Quant <0.27  0.00 - 0.48 ug/mL-FEU   TROPONIN I     Status: Normal   Collection Time   05/30/12 11:17 PM      Component Value Range  Comment   Troponin I <0.30  <0.30 ng/mL   HIV ANTIBODY (ROUTINE TESTING)     Status: Normal   Collection Time  05/30/12 11:17 PM      Component Value Range Comment   HIV NON REACTIVE  NON REACTIVE   HEPARIN LEVEL (UNFRACTIONATED)     Status: Abnormal   Collection Time   05/31/12  4:15 AM      Component Value Range Comment   Heparin Unfractionated 0.10 (*) 0.30 - 0.70 IU/mL   CBC     Status: Abnormal   Collection Time   05/31/12  4:15 AM      Component Value Range Comment   WBC 3.8 (*) 4.0 - 10.5 K/uL    RBC 4.40  4.22 - 5.81 MIL/uL    Hemoglobin 14.6  13.0 - 17.0 g/dL    HCT 84.1  32.4 - 40.1 %    MCV 93.2  78.0 - 100.0 fL    MCH 33.2  26.0 - 34.0 pg    MCHC 35.6  30.0 - 36.0 g/dL    RDW 02.7  25.3 - 66.4 %    Platelets 170  150 - 400 K/uL   TROPONIN I     Status: Normal   Collection Time   05/31/12  4:15 AM      Component Value Range Comment   Troponin I <0.30  <0.30 ng/mL   BASIC METABOLIC PANEL     Status: Normal   Collection Time   05/31/12  4:15 AM      Component Value Range Comment   Sodium 139  135 - 145 mEq/L    Potassium 4.3  3.5 - 5.1 mEq/L    Chloride 104  96 - 112 mEq/L    CO2 26  19 - 32 mEq/L    Glucose, Bld 84  70 - 99 mg/dL    BUN 18  6 - 23 mg/dL    Creatinine, Ser 4.03  0.50 - 1.35 mg/dL    Calcium 9.5  8.4 - 47.4 mg/dL    GFR calc non Af Amer >90  >90 mL/min    GFR calc Af Amer >90  >90 mL/min   HEPARIN LEVEL (UNFRACTIONATED)     Status: Abnormal   Collection Time   05/31/12 10:08 AM      Component Value Range Comment   Heparin Unfractionated 0.24 (*) 0.30 - 0.70 IU/mL   TROPONIN I     Status: Normal   Collection Time   05/31/12 10:11 AM      Component Value Range Comment   Troponin I <0.30  <0.30 ng/mL      Brief narrative:  51 year old male who presented the ER because of chest pain. Patient initially noted some groin pain on the right side. While taking a shower he started developing chest pressure retrosternal nonradiating with some  mild associated shortness of breath which persisted. In the ER he was initially given morphine and later Dilaudid after which his pressure improved but still persisted. EKG revealed anterior lead T-wave changes but cardiac enzymes and chest x-ray were remarkable. Cardiologist on call Dr. Mayford Knife was consulted, but apparently did not see the pt in person. Patient's right groin pain worsened on standing and on palpation. Denied any nausea vomiting but had been having 3-4 episodes of diarrhea for last 2 weeks which was nonbloody.  Assessment/Plan:  Chest / epigastric pain - atypical  Troponin negative x3 - d-dimer is negative - EKG is without acute change , 2-D echo does not show any wall motion abnormalities, it shows mildly decreased ejection fraction- risk factors modest (+tob, no HTN, unknown lipids, no DM, no  fam hx) - pt reports having "clean" cath in [place] 3 years ago . I recommended the patient to have a stress test in a month, he needs to establish a PCP needs to arrange for the stress test. Case management will be notified to help with that - His sx are not consistent with angina (constant since onset - reproducible with palpation) - ?GI source - pt reports signif indigestion - will give trial of protonix and check H pylori abx . He has been recommended to stay away from NSAIDs  Right groin pain / right inguinal hernia  CT scan reveals no evidence of incarceration and suggests only preperitoneal fat within the hernia - exam w/o worrisome findings - counseled pt on incarcerated hernia sx and need for immediate medical attention should these sx develop   Diarrhea  White count is normal - no fever - appears to have resolved at this time - watch for recurrence - low risk profile for C diff   Tobacco abuse  Counseled on abstinence - nicotine patch   Marijuana abuse  Urine drug screen positive - counseled on abstinence    Diffuse diverticulosis  Noted on CT scan - no evidence of diverticulitis    Hypotension  Patient suffered with hypotension during the night - this was likely simply secondary to nitroglycerin dosing - I have discontinued his transdermal nitroglycerin and a nitroglycerin drip - he was dynamically stable at this point       Discharge Exam:  Blood pressure 115/78, pulse 66, temperature 97.7 F (36.5 C), temperature source Oral, resp. rate 17, height 6' (1.829 m), weight 73.1 kg (161 lb 2.5 oz), SpO2 97.00%.    General: No acute respiratory distress  Lungs: Clear to auscultation bilaterally without wheezes or crackles  Cardiovascular: Regular rate and rhythm without murmur gallop or rub  Abdomen: Nontender, nondistended, soft, bowel sounds positive, no rebound, no ascites, no appreciable mass  GU: Exam reveals no significant evidence of indirect or direct inguinal hernia at this time  Extremities: No significant cyanosis, clubbing, or edema bilateral lower extremities        Follow-up Information    Follow up with pcp. Schedule an appointment as soon as possible for a visit in 2 weeks.         SignedRicharda Overlie 06/01/2012, 2:15 PM

## 2012-07-16 ENCOUNTER — Emergency Department (HOSPITAL_COMMUNITY): Payer: Self-pay

## 2012-07-16 ENCOUNTER — Observation Stay (HOSPITAL_COMMUNITY)
Admission: EM | Admit: 2012-07-16 | Discharge: 2012-07-17 | Disposition: A | Discharge status: Home / Self Care | Payer: Self-pay | Attending: Cardiology | Admitting: Cardiology

## 2012-07-16 ENCOUNTER — Encounter (HOSPITAL_COMMUNITY): Payer: Self-pay | Admitting: Emergency Medicine

## 2012-07-16 DIAGNOSIS — K4090 Unilateral inguinal hernia, without obstruction or gangrene, not specified as recurrent: Secondary | ICD-10-CM | POA: Insufficient documentation

## 2012-07-16 DIAGNOSIS — R0989 Other specified symptoms and signs involving the circulatory and respiratory systems: Secondary | ICD-10-CM | POA: Insufficient documentation

## 2012-07-16 DIAGNOSIS — I252 Old myocardial infarction: Secondary | ICD-10-CM | POA: Insufficient documentation

## 2012-07-16 DIAGNOSIS — R0789 Other chest pain: Principal | ICD-10-CM | POA: Insufficient documentation

## 2012-07-16 DIAGNOSIS — F17200 Nicotine dependence, unspecified, uncomplicated: Secondary | ICD-10-CM | POA: Insufficient documentation

## 2012-07-16 DIAGNOSIS — R0609 Other forms of dyspnea: Secondary | ICD-10-CM | POA: Insufficient documentation

## 2012-07-16 DIAGNOSIS — I200 Unstable angina: Secondary | ICD-10-CM

## 2012-07-16 LAB — CBC WITH DIFFERENTIAL/PLATELET
Basophils Absolute: 0 10*3/uL (ref 0.0–0.1)
Eosinophils Relative: 1 % (ref 0–5)
Lymphocytes Relative: 41 % (ref 12–46)
Lymphs Abs: 1.9 10*3/uL (ref 0.7–4.0)
MCV: 94.1 fL (ref 78.0–100.0)
Neutrophils Relative %: 48 % (ref 43–77)
Platelets: 226 10*3/uL (ref 150–400)
RBC: 4.39 MIL/uL (ref 4.22–5.81)
RDW: 13.7 % (ref 11.5–15.5)
WBC: 4.7 10*3/uL (ref 4.0–10.5)

## 2012-07-16 LAB — BASIC METABOLIC PANEL
CO2: 22 mEq/L (ref 19–32)
Calcium: 9.4 mg/dL (ref 8.4–10.5)
GFR calc non Af Amer: >90 mL/min (ref >90)
Glucose, Bld: 78 mg/dL (ref 70–99)
Potassium: 3.8 mEq/L (ref 3.5–5.1)
Sodium: 138 mEq/L (ref 135–145)

## 2012-07-16 LAB — URINALYSIS, ROUTINE W REFLEX MICROSCOPIC
Glucose, UA: NEGATIVE mg/dL
Ketones, ur: NEGATIVE mg/dL
Leukocytes, UA: NEGATIVE
Nitrite: NEGATIVE
Protein, ur: NEGATIVE mg/dL
pH: 5 (ref 5.0–8.0)

## 2012-07-16 LAB — TROPONIN I: Troponin I: <0.3 ng/mL (ref <0.30)

## 2012-07-16 MED ORDER — ALBUTEROL SULFATE (5 MG/ML) 0.5% IN NEBU
5.0000 mg | INHALATION_SOLUTION | Freq: Once | RESPIRATORY_TRACT | Status: AC
  Administered 2012-07-16: 5 mg via RESPIRATORY_TRACT
  Filled 2012-07-16: qty 1

## 2012-07-16 MED ORDER — ALBUTEROL SULFATE (5 MG/ML) 0.5% IN NEBU: 2.5000 mg | INHALATION_SOLUTION | Freq: Once | RESPIRATORY_TRACT | Status: DC

## 2012-07-16 MED ORDER — ASPIRIN 81 MG PO CHEW
162.0000 mg | CHEWABLE_TABLET | Freq: Once | ORAL | Status: AC
  Administered 2012-07-16: 162 mg via ORAL
  Filled 2012-07-16: qty 2

## 2012-07-16 MED ORDER — IPRATROPIUM BROMIDE 0.02 % IN SOLN
0.5000 mg | Freq: Once | RESPIRATORY_TRACT | Status: AC
  Administered 2012-07-16: 0.5 mg via RESPIRATORY_TRACT
  Filled 2012-07-16: qty 2.5

## 2012-07-16 MED ORDER — NITROGLYCERIN 0.4 MG SL SUBL: 0.4000 mg | SUBLINGUAL_TABLET | SUBLINGUAL | Status: DC | PRN

## 2012-07-16 NOTE — ED Notes (Signed)
Patient with chest pain, patient was walking to store.  History of MI one month ago.  Patient has had one nitro and 324mg  with no relief of pain.  Patient is CAOx3.  Patient is non compliant with meds due to financial reasons.

## 2012-07-16 NOTE — ED Notes (Signed)
Pt presented to ED with chest pain and heavy ness in the central chest the patient also complains of pain in the rt groin.

## 2012-07-17 ENCOUNTER — Encounter (HOSPITAL_COMMUNITY): Payer: Self-pay | Admitting: Internal Medicine

## 2012-07-17 ENCOUNTER — Encounter (HOSPITAL_COMMUNITY)
Admission: EM | Disposition: A | Discharge status: Home / Self Care | Payer: Self-pay | Source: Home / Self Care | Attending: Emergency Medicine

## 2012-07-17 DIAGNOSIS — I200 Unstable angina: Secondary | ICD-10-CM

## 2012-07-17 DIAGNOSIS — R0789 Other chest pain: Secondary | ICD-10-CM | POA: Diagnosis present

## 2012-07-17 HISTORY — PX: LEFT HEART CATHETERIZATION WITH CORONARY ANGIOGRAM: SHX5451

## 2012-07-17 SURGERY — LEFT HEART CATHETERIZATION WITH CORONARY ANGIOGRAM
Anesthesia: LOCAL

## 2012-07-17 MED ORDER — VERAPAMIL HCL 2.5 MG/ML IV SOLN
INTRAVENOUS | Status: AC
  Filled 2012-07-17: qty 2

## 2012-07-17 MED ORDER — HYDROMORPHONE HCL PF 2 MG/ML IJ SOLN
0.5000 mg | Freq: Once | INTRAMUSCULAR | Status: AC
  Administered 2012-07-17: 0.5 mg via INTRAVENOUS

## 2012-07-17 MED ORDER — ACETAMINOPHEN 325 MG PO TABS: 650.0000 mg | ORAL_TABLET | ORAL | Status: DC | PRN

## 2012-07-17 MED ORDER — TRAMADOL HCL 50 MG PO TABS: 50.0000 mg | ORAL_TABLET | Freq: Four times a day (QID) | ORAL | Status: DC | PRN

## 2012-07-17 MED ORDER — HEPARIN BOLUS VIA INFUSION
4000.0000 [IU] | Freq: Once | INTRAVENOUS | Status: AC
  Administered 2012-07-17: 4000 [IU] via INTRAVENOUS

## 2012-07-17 MED ORDER — LIDOCAINE HCL (PF) 1 % IJ SOLN
INTRAMUSCULAR | Status: AC
  Filled 2012-07-17: qty 30

## 2012-07-17 MED ORDER — HEPARIN (PORCINE) IN NACL 2-0.9 UNIT/ML-% IJ SOLN
INTRAMUSCULAR | Status: AC
  Filled 2012-07-17: qty 1500

## 2012-07-17 MED ORDER — HYDROMORPHONE HCL PF 2 MG/ML IJ SOLN
0.5000 mg | Freq: Once | INTRAMUSCULAR | Status: DC
  Filled 2012-07-17: qty 2

## 2012-07-17 MED ORDER — NITROGLYCERIN 2 % TD OINT
0.5000 [in_us] | TOPICAL_OINTMENT | Freq: Once | TRANSDERMAL | Status: AC
  Administered 2012-07-17: 0.5 [in_us] via TOPICAL
  Filled 2012-07-17: qty 1

## 2012-07-17 MED ORDER — SODIUM CHLORIDE 0.9 % IV SOLN: 1.0000 mL/kg/h | INTRAVENOUS | Status: DC

## 2012-07-17 MED ORDER — MIDAZOLAM HCL 2 MG/2ML IJ SOLN
INTRAMUSCULAR | Status: AC
  Filled 2012-07-17: qty 2

## 2012-07-17 MED ORDER — NITROGLYCERIN 0.2 MG/ML ON CALL CATH LAB
INTRAVENOUS | Status: AC
  Filled 2012-07-17: qty 1

## 2012-07-17 MED ORDER — HYDROMORPHONE HCL PF 2 MG/ML IJ SOLN
INTRAMUSCULAR | Status: AC
  Filled 2012-07-17: qty 1

## 2012-07-17 MED ORDER — ONDANSETRON HCL 4 MG/2ML IJ SOLN: 4.0000 mg | Freq: Four times a day (QID) | INTRAMUSCULAR | Status: DC | PRN

## 2012-07-17 MED ORDER — HEPARIN (PORCINE) IN NACL 100-0.45 UNIT/ML-% IJ SOLN
900.0000 [IU]/h | INTRAMUSCULAR | Status: DC
  Administered 2012-07-17: 900 [IU]/h via INTRAVENOUS
  Filled 2012-07-17: qty 250

## 2012-07-17 NOTE — ED Notes (Signed)
Patient currently asleep in bed; no respiratory or acute distress noted.  Will continue to monitor.  Still waiting for admitting MD to return page at this time.

## 2012-07-17 NOTE — ED Notes (Signed)
Paged admitting MD again. 

## 2012-07-17 NOTE — ED Notes (Signed)
Calling report now. 

## 2012-07-17 NOTE — ED Notes (Signed)
Patient currently resting quietly in bed; no respiratory or acute distress noted.  Patient requesting something to eat; patient informed that he currently has an order for NPO status and can have nothing by mouth.  Patient verbalized understanding; will continue to monitor.

## 2012-07-17 NOTE — Consult Note (Signed)
Patient's PCP: Default, Provider, MD Referring physician: Dr. Jacinto Halim  Chief Complaint: Chest pain  History of Present Illness: Sean Martinez is a 52 y.o. African American male with no significant past medical history except hernia repair who presents with complaints of chest pain and subsequently had a cardiac catheter today showed normal coronary arteries.  Patient reports that he has been having chest pain on and off for the last few years.  He was recently hospitalized in December of 2013 with chest pain, he was ruled out for acute coronary syndrome, 2-D echocardiogram on 05/31/2012 showed mildly reduced systolic function EF of 45-50%.  Dr. Jacinto Halim, with cardiology decided to perform cardiac catheter today.  Subsequently after cardiac catheter he had persistent chest pain as a result hospice service was consulted for further care and management.  Patient denies any recent fevers, chills, nausea, vomiting, shortness of breath, abdominal pain, diarrhea, headaches or vision changes.  Review of Systems: All systems reviewed with the patient and positive as per history of present illness, otherwise all other systems are negative.  Past Medical History  Diagnosis Date  . MI, old    Past Surgical History  Procedure Date  . Hernia repair    Family History  Problem Relation Age of Onset  . Diabetes Mellitus II Mother   . Diabetes Mellitus II Father    History   Social History  . Marital Status: Single    Spouse Name: N/A    Number of Children: N/A  . Years of Education: N/A   Occupational History  . Not on file.   Social History Main Topics  . Smoking status: Current Every Day Smoker    Types: Cigarettes  . Smokeless tobacco: Not on file  . Alcohol Use: Yes     Comment: occ  . Drug Use: Yes    Special: Marijuana  . Sexually Active:    Other Topics Concern  . Not on file   Social History Narrative  . No narrative on file   Allergies: Penicillins  Home Meds: Prior to  Admission medications   Medication Sig Start Date End Date Taking? Authorizing Provider  traMADol (ULTRAM) 50 MG tablet Take 1 tablet (50 mg total) by mouth every 6 (six) hours as needed for pain. 07/17/12   Pamella Pert, MD    Physical Exam: Blood pressure 125/75, pulse 74, temperature 97.9 F (36.6 C), temperature source Oral, resp. rate 16, height 6' 0.05" (1.83 m), weight 73 kg (160 lb 15 oz), SpO2 95.00%. General: Awake, Oriented x3, No acute distress. HEENT: EOMI, Moist mucous membranes Neck: Supple CV: S1 and S2 Lungs: Clear to ascultation bilaterally Abdomen: Soft, Nontender, Nondistended, +bowel sounds. Ext: Good pulses. Trace edema. No clubbing or cyanosis noted. Neuro: Cranial Nerves II-XII grossly intact. Has 5/5 motor strength in upper and lower extremities. Chest: Pain with palpation over the sternum.  Lab results:  [facility] 07/16/12 2134  NA 138  K 3.8  CL 100  CO2 22  GLUCOSE 78  BUN 20  CREATININE 0.78  CALCIUM 9.4  MG --  PHOS --   No results found for this basename: AST:2,ALT:2,ALKPHOS:2,BILITOT:2,PROT:2,ALBUMIN:2 in the last 72 hours No results found for this basename: LIPASE:2,AMYLASE:2 in the last 72 hours  Basename 07/16/12 2134  WBC 4.7  NEUTROABS 2.3  HGB 14.5  HCT 41.3  MCV 94.1  PLT 226    Basename 07/16/12 2135  CKTOTAL --  CKMB --  CKMBINDEX --  TROPONINI <0.30   No components found  with this basename: POCBNP:3 No results found for this basename: DDIMER in the last 72 hours No results found for this basename: HGBA1C:2 in the last 72 hours No results found for this basename: CHOL:2,HDL:2,LDLCALC:2,TRIG:2,CHOLHDL:2,LDLDIRECT:2 in the last 72 hours No results found for this basename: TSH,T4TOTAL,FREET3,T3FREE,THYROIDAB in the last 72 hours No results found for this basename: VITAMINB12:2,FOLATE:2,FERRITIN:2,TIBC:2,IRON:2,RETICCTPCT:2 in the last 72 hours Imaging results:  Dg Chest 2 View  07/16/2012  *RADIOLOGY REPORT*  Clinical  Data: Chest pain and weakness.  CHEST - 2 VIEW  Comparison: PA and lateral chest 05/30/2012 and 09/17/2011.  Findings: The chest is hyperexpanded compatible with emphysema. Lungs are clear.  Heart size is normal.  No pneumothorax or pleural effusion.  IMPRESSION: No acute finding.  Pulmonary hyperexpansion compatible with emphysema.   Original Report Authenticated By: Holley Dexter, M.D.    Other results: EKG: EKG shows sinus tachycardia with heart rate of 104.  Assessment & Plan by Problem: Chest pain EKG, cardiac catheter report reviewed, chest x-ray shows no acute findings.  As pain is reproducible with palpation, suspect patient likely has chest wall pain.  Patient has been started on tramadol for pain.    Tobacco abuse Counseled on cessation.  Discussed with the patient about following up with primary care physician for further management.  Patient given resources in his after visit summary to call to establish with primary care physician.  He was also instructed that if his symptoms were not improved and he is to come back to the emergency department for further evaluation.  Time spent on consult, talking to the patient, and coordinating care was: 45 mins.  Willine Schwalbe A, MD 07/17/2012, 12:53 PM

## 2012-07-17 NOTE — Interval H&P Note (Signed)
History and Physical Interval Note:  07/17/2012 7:47 AM  Sean Martinez  has presented today for surgery, with the diagnosis of cp  The various methods of treatment have been discussed with the patient and family. After consideration of risks, benefits and other options for treatment, the patient has consented to  Procedure(s) (LRB) with comments: LEFT HEART CATHETERIZATION WITH CORONARY ANGIOGRAM (N/A) and possible angioplasty as a surgical intervention .  The patient's history has been reviewed, patient examined, no change in status, stable for surgery.  I have reviewed the patient's chart and labs.  Questions were answered to the patient's satisfaction.     Pamella Pert

## 2012-07-17 NOTE — ED Notes (Signed)
Received bedside report from [place].  Patient finished with nebulizer treatment at this time; rates his chest pain 8/10 on the numerical pain scale.  Patient given another blanket, per request.  Denies any other needs at this time; will continue to monitor.

## 2012-07-17 NOTE — H&P (Signed)
Sean Martinez is an 52 y.o. male.   Chief Complaint: Chest pain that started yesterday HPI: Patient is a 52 year old African American male with history of prior tobacco use, who was previously evaluated on 05/31/2012 when he presented with chest pain and right inguinal pain. She was ruled out for myocardial infarction and was discharged home. He presents back to the emergency department complaining of chest pain which is in the left upper part of the chest, which is continuous, worsening occasionally and getting better at times, was given sublingual nitroglycerin with significant decrease in chest discomfort. He still says that his chest is hurting and is worse with taking deep breath. No associated shortness of breath except for dyspnea due to the chest discomfort. He denies any hemoptysis, painful swelling of the lower this, dizziness or syncope. He denies any illicit drug use specifically cocaine although he has used marijuana in the past.  Past Medical History  Diagnosis Date  . MI, old     Past Surgical History  Procedure Date  . Hernia repair     Family History  Problem Relation Age of Onset  . Diabetes Mellitus II Mother   . Diabetes Mellitus II Father    Social History:  reports that he has been smoking Cigarettes.  He does not have any smokeless tobacco history on file. He reports that he drinks alcohol. He reports that he uses illicit drugs (Marijuana).  Allergies:  Allergies  Allergen Reactions  . Penicillins Hives    Review of Systems -  he also complains of pain radiating down to his right scrotum, it is present only when his chest hurts the most, not present presently. He denies any nausea, vomiting, urinary disturbances, hematuria. He denies any recent weight change. He denies any neurological deficits. Is not a diabetic. Crystals were negative.  Blood pressure 110/58, pulse 72, temperature 97.9 F (36.6 C), temperature source Oral, resp. rate 16, height 6' 0.05" (1.83  m), weight 73 kg (160 lb 15 oz), SpO2 95.00%. General appearance: alert, cooperative, appears older than stated age and no distress Eyes: conjunctivae/corneas clear. PERRL, EOM's intact. Fundi benign. Neck: no adenopathy, no carotid bruit, no JVD, supple, symmetrical, trachea midline and thyroid not enlarged, symmetric, no tenderness/mass/nodules Neck: JVP - normal, carotids 2+= without bruits Resp: clear to auscultation bilaterally Chest wall: left sided chest wall tenderness Cardio: regular rate and rhythm, S1, S2 normal, no murmur, click, rub or gallop GI: soft, non-tender; bowel sounds normal; no masses,  no organomegaly Extremities: extremities normal, atraumatic, no cyanosis or edema Pulses: 2+ and symmetric Skin: Skin color, texture, turgor normal. No rashes or lesions Neurologic: Grossly normal Right groin no tenderness or mass felt.  Results for orders placed during the hospital encounter of 07/16/12 (from the past 48 hour(s))  CBC WITH DIFFERENTIAL     Status: Normal   Collection Time   07/16/12  9:34 PM      Component Value Range Comment   WBC 4.7  4.0 - 10.5 K/uL    RBC 4.39  4.22 - 5.81 MIL/uL    Hemoglobin 14.5  13.0 - 17.0 g/dL    HCT 16.1  09.6 - 04.5 %    MCV 94.1  78.0 - 100.0 fL    MCH 33.0  26.0 - 34.0 pg    MCHC 35.1  30.0 - 36.0 g/dL    RDW 40.9  81.1 - 91.4 %    Platelets 226  150 - 400 K/uL    Neutrophils Relative  48  43 - 77 %    Neutro Abs 2.3  1.7 - 7.7 K/uL    Lymphocytes Relative 41  12 - 46 %    Lymphs Abs 1.9  0.7 - 4.0 K/uL    Monocytes Relative 9  3 - 12 %    Monocytes Absolute 0.4  0.1 - 1.0 K/uL    Eosinophils Relative 1  0 - 5 %    Eosinophils Absolute 0.0  0.0 - 0.7 K/uL    Basophils Relative 1  0 - 1 %    Basophils Absolute 0.0  0.0 - 0.1 K/uL   BASIC METABOLIC PANEL     Status: Normal   Collection Time   07/16/12  9:34 PM      Component Value Range Comment   Sodium 138  135 - 145 mEq/L    Potassium 3.8  3.5 - 5.1 mEq/L    Chloride 100   96 - 112 mEq/L    CO2 22  19 - 32 mEq/L    Glucose, Bld 78  70 - 99 mg/dL    BUN 20  6 - 23 mg/dL    Creatinine, Ser 1.61  0.50 - 1.35 mg/dL    Calcium 9.4  8.4 - 09.6 mg/dL    GFR calc non Af Amer >90  >90 mL/min    GFR calc Af Amer >90  >90 mL/min   TROPONIN I     Status: Normal   Collection Time   07/16/12  9:35 PM      Component Value Range Comment   Troponin I <0.30  <0.30 ng/mL   URINALYSIS, ROUTINE W REFLEX MICROSCOPIC     Status: Normal   Collection Time   07/16/12  9:49 PM      Component Value Range Comment   Color, Urine YELLOW  YELLOW    APPearance CLEAR  CLEAR    Specific Gravity, Urine 1.011  1.005 - 1.030    pH 5.0  5.0 - 8.0    Glucose, UA NEGATIVE  NEGATIVE mg/dL    Hgb urine dipstick NEGATIVE  NEGATIVE    Bilirubin Urine NEGATIVE  NEGATIVE    Ketones, ur NEGATIVE  NEGATIVE mg/dL    Protein, ur NEGATIVE  NEGATIVE mg/dL    Urobilinogen, UA 0.2  0.0 - 1.0 mg/dL    Nitrite NEGATIVE  NEGATIVE    Leukocytes, UA NEGATIVE  NEGATIVE MICROSCOPIC NOT DONE ON URINES WITH NEGATIVE PROTEIN, BLOOD, LEUKOCYTES, NITRITE, OR GLUCOSE <1000 mg/dL.   Dg Chest 2 View  07/16/2012  *RADIOLOGY REPORT*  Clinical Data: Chest pain and weakness.  CHEST - 2 VIEW  Comparison: PA and lateral chest 05/30/2012 and 09/17/2011.  Findings: The chest is hyperexpanded compatible with emphysema. Lungs are clear.  Heart size is normal.  No pneumothorax or pleural effusion.  IMPRESSION: No acute finding.  Pulmonary hyperexpansion compatible with emphysema.   Original Report Authenticated By: Holley Dexter, M.D.     Labs:   Lab Results  Component Value Date   WBC 4.7 07/16/2012   HGB 14.5 07/16/2012   HCT 41.3 07/16/2012   MCV 94.1 07/16/2012   PLT 226 07/16/2012    Lab 07/16/12 2134  NA 138  K 3.8  CL 100  CO2 22  BUN 20  CREATININE 0.78  CALCIUM 9.4  PROT --  BILITOT --  ALKPHOS --  ALT --  AST --  GLUCOSE 78   Lab Results  Component Value Date   TROPONINI <0.30 07/16/2012  EKG:  normal EKG, normal sinus rhythm, unchanged from previous tracings.   Assessment/Plan 1. Atypical chest pain, probably not angina pectoris.  2. Tobacco use disorder. 3. Very small right inguinal hernia. 4. Mild decrease in LVEF by echo 05/31/12, EF 45-50%  Patient presenting with recurrent chest discomfort to the emergency department. Previously an echocardiogram done about 2 months ago had revealed a ejection fraction of 45-50%, no further evaluation is done. Due to recurrent chest discomfort, which was partially nitroglycerin responsive, although atypical we'll proceed with cardiac catheterization for definitive diagnosis of coronary artery disease. I've discussed with the patient regarding cardiac stress testing versus invasive procedure. Like to proceed directly with invasive procedure. He denies any drug use specifically cocaine use. I have discussed smoking cessation with the patient.  Pamella Pert, MD 07/17/2012, 7:41 AM [facility] Pager: [phone] Office: [phone] If no answer: Cell:  [phone]

## 2012-07-17 NOTE — ED Notes (Signed)
Report received from Minna Merritts, RN

## 2012-07-17 NOTE — CV Procedure (Signed)
Procedure performed:  Left heart catheterization including hemodynamic monitoring of the left ventricle, LV gram, selective right and left coronary arteriography.  Indication patient is a 52-year-old African American male with no significant plaque or last history was admitted via emergency room. This the second admission to the emergency room within the past 6 weeks. Due to ongoing chest discomfort although for age, felt it was prudent to proceed with cardiac catheterization for definitive diagnosis of coronary artery disease. Hence is brought to the cardiac catheterization lab to evaluate the  coronary anatomy for definitive diagnosis of CAD.  Hemodynamic data:  Left ventricular pressure was 116/-6 with LVEDP of 8 mm mercury. Aortic pressure was 116/73 with a mean of 76 mm mercury. There was no pressure gradient across the aortic valve  Left ventricle: Performed in the RAO projection revealed LVEF of 60-65%. There was No MR. No wall motion abnormality.  Right coronary artery: The vessel is smooth, normal, Dominant.  Left main coronary artery is large and normal.  Circumflex coronary artery: A large vessel giving origin to a large obtuse marginal 1. It is smooth and normal.   LAD:  LAD gives origin to a moderate diagonal-1.  LAD normal.  Ascending aortogram: Revealed results of the aortic valve cusps, no evidence of aortic dissection,  aortic regurgitation.  Technique: Under sterile precautions using a 6 French right radial  arterial access, a 6 French sheath was introduced into the right radial artery. A 5 French Tig 4 catheter was advanced into the ascending aorta selective  right coronary artery and left coronary artery was cannulated and angiography was performed in multiple views. The catheter was pulled back Out of the body over exchange length J-wire.  Same Catheter was used to perform LV gram which was performed in LAO projection.  Catheter exchanged out of the body over J-Wire. NO  immediate complications noted. Patient tolerated the procedure well.   Rec: Medical therapy with aggressive risk factor reduction.   Disposition: Will be discharged home today.  

## 2012-07-17 NOTE — ED Notes (Signed)
Patient requesting something to eat; informed patient that he is still NPO status at this time; patient verbalized understanding.  Given another blanket and pillow, per request.  Denies any other needs at this time; will continue to monitor.

## 2012-07-17 NOTE — ED Notes (Signed)
Paging admitting MD for new bed request; awaiting return phone call.

## 2012-07-17 NOTE — Progress Notes (Signed)
NCM provided contacts in dc instructions on pcp in the community that accept self pay patients. Pt was encouraged to follow up post dc. Isidoro Donning RN CCM Case Mgmt phone [phone]

## 2012-07-17 NOTE — Progress Notes (Signed)
ANTICOAGULATION CONSULT NOTE - Initial Consult  Pharmacy Consult for  Indication: chest pain/ACS  Allergies  Allergen Reactions  . Penicillins Hives    Patient Measurements: Height: 6' 0.05" (183 cm) Weight: 160 lb 15 oz (73 kg) IBW/kg (Calculated) : 77.71   Vital Signs: Temp: 98 F (36.7 C) (01/26 2026) Temp src: Oral (01/26 2026) BP: 111/69 mmHg (01/27 0017) Pulse Rate: 91  (01/27 0017)  Labs:  Alvira Philips 07/16/12 2135 07/16/12 2134  HGB -- 14.5  HCT -- 41.3  PLT -- 226  APTT -- --  LABPROT -- --  INR -- --  HEPARINUNFRC -- --  CREATININE -- 0.78  CKTOTAL -- --  CKMB -- --  TROPONINI <0.30 --    Estimated Creatinine Clearance: 112.8 ml/min (by C-G formula based on Cr of 0.78).   Medical History: Past Medical History  Diagnosis Date  . MI, old     Medications:  Noncompliant with medications  Assessment: 52 yo male with chest pain for Heparin  Goal of Therapy:  Heparin level 0.3-0.7 units/ml Monitor platelets by anticoagulation protocol: Yes   Plan:  Heparin 4000 units IV bolus, then 900 units/hr Check heparin level in 6 hours.  Orrie Schubert, Gary Fleet 07/17/2012,12:53 AM

## 2012-07-17 NOTE — ED Notes (Signed)
Patient currently asleep in bed; no respiratory or acute distress noted.  Patient pending for step down bed at this time (step down patient are boarding in ED overnight).  Will continue to monitor.

## 2012-07-17 NOTE — ED Notes (Signed)
Patient currently resting quietly in bed; no respiratory or acute distress noted.  Patient updated on plan of care; informed patient that we are currently waiting on bed request; patient denies any other needs at this time; will continue to monitor.

## 2012-07-17 NOTE — ED Notes (Signed)
Patient unable to go to 2000 due to 8/10 chest pain; charge RN and flow manager aware.  Waiting on Cardiologist (admitting) to place bed request for step down unit.

## 2012-07-17 NOTE — ED Notes (Signed)
Patient currently asleep in bed; no respiratory or acute distress noted.  Patient pending for step down bed at this time.  Will continue to monitor.

## 2012-07-17 NOTE — Progress Notes (Signed)
HOSPITALIST IN TO SEE CLIENT AND OK TO D/C HOME

## 2012-07-18 NOTE — ED Provider Notes (Addendum)
History     CSN: [id number]  Arrival date & time 07/16/12  2025   First MD Initiated Contact with Patient 07/16/12 2052      Chief Complaint  Patient presents with  . Chest Pain    (Consider location/radiation/quality/duration/timing/severity/associated sxs/prior treatment) HPI Comments: Pt comes in with cc of chest pain. The chest pain os midsternal, pressure like and worse with movement. Constitutional are negative. Has had similar chest pains in the past. Pt reports having a MI 2-3 years ago. Pt was post prandial when the pain started, and it responded to nitro.   Patient is a 52 y.o. male presenting with chest pain. The history is provided by the patient.  Chest Pain Primary symptoms include dizziness. Pertinent negatives for primary symptoms include no fever, no shortness of breath and no cough.     Past Medical History  Diagnosis Date  . MI, old     Past Surgical History  Procedure Date  . Hernia repair     Family History  Problem Relation Age of Onset  . Diabetes Mellitus II Mother   . Diabetes Mellitus II Father     History  Substance Use Topics  . Smoking status: Current Every Day Smoker    Types: Cigarettes  . Smokeless tobacco: Not on file  . Alcohol Use: Yes     Comment: occ      Review of Systems  Constitutional: Negative for fever, chills and activity change.  HENT: Negative for neck pain.   Eyes: Negative for visual disturbance.  Respiratory: Negative for cough, chest tightness and shortness of breath.   Cardiovascular: Positive for chest pain.  Gastrointestinal: Negative for abdominal distention.  Genitourinary: Negative for dysuria, enuresis and difficulty urinating.  Musculoskeletal: Negative for arthralgias.  Neurological: Positive for dizziness. Negative for light-headedness and headaches.  Psychiatric/Behavioral: Negative for confusion.    Allergies  Penicillins  Home Medications   Current Outpatient Rx  Name  Route  Sig   Dispense  Refill  . ACETAMINOPHEN 325 MG PO TABS   Oral   Take 2 tablets (650 mg total) by mouth every 4 (four) hours as needed.         Marland Kitchen TRAMADOL HCL 50 MG PO TABS   Oral   Take 1 tablet (50 mg total) by mouth every 6 (six) hours as needed for pain.   30 tablet   0     BP 125/75  Pulse 74  Temp 97.9 F (36.6 C) (Oral)  Resp 16  Ht 6' 0.05" (1.83 m)  Wt 160 lb 15 oz (73 kg)  BMI 21.80 kg/m2  SpO2 95%  Physical Exam  Nursing note and vitals reviewed. Constitutional: He is oriented to person, place, and time. He appears well-developed.  HENT:  Head: Normocephalic and atraumatic.  Eyes: Conjunctivae normal and EOM are normal. Pupils are equal, round, and reactive to light.  Neck: Normal range of motion. Neck supple.  Cardiovascular: Normal rate and regular rhythm.   Pulmonary/Chest: Effort normal and breath sounds normal.  Abdominal: Soft. Bowel sounds are normal. He exhibits no distension. There is no tenderness. There is no rebound and no guarding.  Neurological: He is alert and oriented to person, place, and time.  Skin: Skin is warm.    ED Course  Procedures (including critical care time)   Labs Reviewed  CBC WITH DIFFERENTIAL  BASIC METABOLIC PANEL  TROPONIN I  URINALYSIS, ROUTINE W REFLEX MICROSCOPIC   Dg Chest 2 View  07/16/2012  *RADIOLOGY  REPORT*  Clinical Data: Chest pain and weakness.  CHEST - 2 VIEW  Comparison: PA and lateral chest 05/30/2012 and 09/17/2011.  Findings: The chest is hyperexpanded compatible with emphysema. Lungs are clear.  Heart size is normal.  No pneumothorax or pleural effusion.  IMPRESSION: No acute finding.  Pulmonary hyperexpansion compatible with emphysema.   Original Report Authenticated By: Holley Dexter, M.D.      1. Unstable angina       MDM   Date: 07/18/2012  Rate: 102  Rhythm: sinus tachycardia  QRS Axis: normal  Intervals: normal  ST/T Wave abnormalities: nonspecific ST/T changes  Conduction  Disutrbances:none  Narrative Interpretation:   Old EKG Reviewed: unchanged  Differential diagnosis includes: ACS syndrome CHF exacerbation Valvular disorder Myocarditis Pericarditis Pericardial effusion Pneumonia Pleural effusion Pulmonary edema PE Anemia Musculoskeletal pain  Pt comes in with cc of pressure like chest pain. He has had similar chest pains in the past. PE ruled out. 2D echo showed mild CHF - but provocative testing at any point. His pain is responding to nitro. We will get anticoagulation on board and nitro paste. Consulted Dr. Jacinto Halim - who requests temp ordered and npo after MN for cath tomorrow.          Derwood Kaplan, MD 07/18/12 0020  Derwood Kaplan, MD 07/27/12 4540

## 2012-07-21 ENCOUNTER — Emergency Department (HOSPITAL_COMMUNITY)
Admission: EM | Admit: 2012-07-21 | Discharge: 2012-07-22 | Disposition: A | Discharge status: Home / Self Care | Payer: Self-pay | Attending: Emergency Medicine | Admitting: Emergency Medicine

## 2012-07-21 DIAGNOSIS — G8929 Other chronic pain: Secondary | ICD-10-CM | POA: Insufficient documentation

## 2012-07-21 DIAGNOSIS — I252 Old myocardial infarction: Secondary | ICD-10-CM | POA: Insufficient documentation

## 2012-07-21 DIAGNOSIS — R079 Chest pain, unspecified: Secondary | ICD-10-CM | POA: Insufficient documentation

## 2012-07-21 DIAGNOSIS — F17200 Nicotine dependence, unspecified, uncomplicated: Secondary | ICD-10-CM | POA: Insufficient documentation

## 2012-07-21 MED ORDER — KETOROLAC TROMETHAMINE 30 MG/ML IJ SOLN
30.0000 mg | Freq: Once | INTRAMUSCULAR | Status: AC
  Administered 2012-07-21: 30 mg via INTRAVENOUS
  Filled 2012-07-21: qty 1

## 2012-07-21 MED ORDER — NITROGLYCERIN 0.4 MG SL SUBL: 0.4000 mg | SUBLINGUAL_TABLET | SUBLINGUAL | Status: DC | PRN

## 2012-07-21 NOTE — ED Provider Notes (Signed)
History     CSN: [id number]  Arrival date & time 07/21/12  2330   First MD Initiated Contact with Patient 07/21/12 2339      Chief Complaint  Patient presents with  . Chest Pain    (Consider location/radiation/quality/duration/timing/severity/associated sxs/prior treatment) HPI Pt with reported remote history of MI has had chest pain off and on for years. He has had two recent admissions for same including about 5 days ago when he had a normal cardiac cath. He reports he was walking home from a friends house this evening just prior to arrival when he had onset of severe midsternal pressure, non-radiating, not associated with SOB and not improved with ASA and NTG given by EMS. This is similar to his other recent ED visits.   Past Medical History  Diagnosis Date  . MI, old     Past Surgical History  Procedure Date  . Hernia repair     Family History  Problem Relation Age of Onset  . Diabetes Mellitus II Mother   . Diabetes Mellitus II Father     History  Substance Use Topics  . Smoking status: Current Every Day Smoker    Types: Cigarettes  . Smokeless tobacco: Not on file  . Alcohol Use: Yes     Comment: occ      Review of Systems All other systems reviewed and are negative except as noted in HPI.   Allergies  Penicillins  Home Medications   Current Outpatient Rx  Name  Route  Sig  Dispense  Refill  . ACETAMINOPHEN 325 MG PO TABS   Oral   Take 2 tablets (650 mg total) by mouth every 4 (four) hours as needed.         Marland Kitchen TRAMADOL HCL 50 MG PO TABS   Oral   Take 1 tablet (50 mg total) by mouth every 6 (six) hours as needed for pain.   30 tablet   0     BP 126/72  Pulse 86  Temp 97.9 F (36.6 C) (Oral)  Resp 15  SpO2 96%  Physical Exam  Nursing note and vitals reviewed. Constitutional: He is oriented to person, place, and time. He appears well-developed and well-nourished.  HENT:  Head: Normocephalic and atraumatic.  Eyes: EOM are normal.  Pupils are equal, round, and reactive to light.  Neck: Normal range of motion. Neck supple.  Cardiovascular: Normal rate, normal heart sounds and intact distal pulses.   Pulmonary/Chest: Effort normal and breath sounds normal. He exhibits tenderness.  Abdominal: Bowel sounds are normal. He exhibits no distension. There is no tenderness.  Musculoskeletal: Normal range of motion. He exhibits no edema and no tenderness.  Neurological: He is alert and oriented to person, place, and time. He has normal strength. No cranial nerve deficit or sensory deficit.  Skin: Skin is warm and dry. No rash noted.  Psychiatric: He has a normal mood and affect.    ED Course  Procedures (including critical care time)  Labs Reviewed  BASIC METABOLIC PANEL - Abnormal; Notable for the following:    Glucose, Bld 134 (*)     All other components within normal limits  URINE RAPID DRUG SCREEN (HOSP PERFORMED) - Abnormal; Notable for the following:    Tetrahydrocannabinol POSITIVE (*)     All other components within normal limits  CBC  POCT I-STAT TROPONIN I  POCT I-STAT TROPONIN I   Dg Chest 2 View  07/22/2012  *RADIOLOGY REPORT*  Clinical Data: Chest pain and  shortness breath.  Previous myocardial infarct.  CHEST - 2 VIEW  Comparison:  07/16/2012  Findings:  The heart size and mediastinal contours are within normal limits.  Both lungs are clear.  The visualized skeletal structures are unremarkable.  IMPRESSION: No active cardiopulmonary disease.   Original Report Authenticated By: Myles Rosenthal, M.D.      No diagnosis found.    MDM   Date: 07/21/2012  Rate: 88  Rhythm: normal sinus rhythm  QRS Axis: normal  Intervals: normal  ST/T Wave abnormalities: normal  Conduction Disutrbances:none  Narrative Interpretation:   Old EKG Reviewed: unchanged  2:38 AM Pt sleeping comfortably, normal labs and imaging including two negative cardiac markers. Will clean cath less than a week ago, no concern for ACS/CAD.  Will d/c with PCP followup.        Charles B. Bernette Mayers, MD 07/22/12 [phone]

## 2012-07-21 NOTE — ED Notes (Signed)
Cp while walking home. Described as pressure, midsternum, sob, non-radiating; MI last week. Cath lab x 5 days ago. 12 ecg unremarkable. 324 mg asa and x 1 nitro 0.4 mg sl. Did have marijuana and some etoh.

## 2012-07-22 ENCOUNTER — Emergency Department (HOSPITAL_COMMUNITY): Payer: Self-pay

## 2012-07-22 LAB — BASIC METABOLIC PANEL
CO2: 24 mEq/L (ref 19–32)
Calcium: 9 mg/dL (ref 8.4–10.5)
Creatinine, Ser: 0.71 mg/dL (ref 0.50–1.35)
GFR calc Af Amer: >90 mL/min (ref >90)
Sodium: 139 mEq/L (ref 135–145)

## 2012-07-22 LAB — RAPID URINE DRUG SCREEN, HOSP PERFORMED
Cocaine: NOT DETECTED
Opiates: NOT DETECTED
Tetrahydrocannabinol: POSITIVE — AB

## 2012-07-22 LAB — CBC
MCH: 33.6 pg (ref 26.0–34.0)
MCV: 95.6 fL (ref 78.0–100.0)
Platelets: 222 10*3/uL (ref 150–400)
RBC: 4.28 MIL/uL (ref 4.22–5.81)
RDW: 13.9 % (ref 11.5–15.5)
WBC: 4.2 10*3/uL (ref 4.0–10.5)

## 2012-07-22 LAB — POCT I-STAT TROPONIN I: Troponin i, poc: 0.01 ng/mL (ref 0.00–0.08)

## 2012-07-28 ENCOUNTER — Encounter: Payer: Self-pay | Admitting: Cardiology

## 2012-07-28 NOTE — Discharge Summary (Signed)
Physician Discharge Summary  Patient ID: TYHIR SCHWAN MRN: [id number] DOB/AGE: July 15, 1960 52 y.o.  Admit date: 07/16/2012 Discharge date: 07/28/2012  Primary Discharge Diagnosis Chest pain of musculoskeletal etiology  Secondary Discharge Diagnosis Tobacco use disorder  Significant Diagnostic Studies: Coronary angiogram:  Left ventricle: Performed in the RAO projection revealed LVEF of 60-65%. There was No MR. No wall motion abnormality.  Right coronary artery: The vessel is smooth, normal, Dominant.  Left main coronary artery is large and normal.  Circumflex coronary artery: A large vessel giving origin to a large obtuse marginal 1. It is smooth and normal.  LAD: LAD gives origin to a moderate diagonal-1. LAD normal.  Ascending aortogram: Revealed results of the aortic valve cusps, no evidence of aortic dissection, aortic regurgitation.  Consults: [employer]: For chest pain. He also agreed with my assessment.   Hospital Course: Admitted via ED for ongoing chest pain. Due to patient preference and ongoing chest pain, underwent cardiac cath and revealing normal coronary arteries and hence felt stable for discharge the same day as admission.   Recommendations on discharge: Tobacco smoking cessation.   Discharge Exam: Blood pressure 125/75, pulse 74, temperature 97.9 F (36.6 C), temperature source Oral, resp. rate 16, height 6' 0.05" (1.83 m), weight 73 kg (160 lb 15 oz), SpO2 95.00%.   General appearance: alert, cooperative, appears older than stated age and no distress  Eyes: conjunctivae/corneas clear. PERRL, EOM's intact. Fundi benign.  Neck: no adenopathy, no carotid bruit, no JVD, supple, symmetrical, trachea midline and thyroid not enlarged, symmetric, no tenderness/mass/nodules  Neck: JVP - normal, carotids 2+= without bruits  Resp: clear to auscultation bilaterally  Chest wall: left sided chest wall tenderness  Cardio: regular rate and rhythm, S1, S2 normal,  no murmur, click, rub or gallop  GI: soft, non-tender; bowel sounds normal; no masses, no organomegaly  Extremities: extremities normal, atraumatic, no cyanosis or edema  Pulses: 2+ and symmetric  Skin: Skin color, texture, turgor normal. No rashes or lesions  Neurologic: Grossly normal Right radial access site good. Labs:   Lab Results  Component Value Date   WBC 4.2 07/21/2012   HGB 14.4 07/21/2012   HCT 40.9 07/21/2012   MCV 95.6 07/21/2012   PLT 222 07/21/2012    Lab 07/21/12 2347  NA 139  K 3.9  CL 103  CO2 24  BUN 13  CREATININE 0.71  CALCIUM 9.0  PROT --  BILITOT --  ALKPHOS --  ALT --  AST --  GLUCOSE 134*   Lab Results  Component Value Date   TROPONINI <0.30 07/16/2012    EKG:NSR, normal EKG. No ischemia.    Radiology: Dg Chest 2 View  07/22/2012  *RADIOLOGY REPORT*  Clinical Data: Chest pain and shortness breath.  Previous myocardial infarct.  CHEST - 2 VIEW  Comparison:  07/16/2012  Findings:  The heart size and mediastinal contours are within normal limits.  Both lungs are clear.  The visualized skeletal structures are unremarkable.  IMPRESSION: No active cardiopulmonary disease.   Original Report Authenticated By: Myles Rosenthal, M.D.    Dg Chest 2 View  07/16/2012  *RADIOLOGY REPORT*  Clinical Data: Chest pain and weakness.  CHEST - 2 VIEW  Comparison: PA and lateral chest 05/30/2012 and 09/17/2011.  Findings: The chest is hyperexpanded compatible with emphysema. Lungs are clear.  Heart size is normal.  No pneumothorax or pleural effusion.  IMPRESSION: No acute finding.  Pulmonary hyperexpansion compatible with emphysema.   Original Report Authenticated By: Maisie Fus  Maricela Curet, M.D.       FOLLOW UP PLANS AND APPOINTMENTS: To follow up with me as needed. Needs to F/U and establish with PCP.    Medication List     As of 07/28/2012  6:28 PM    TAKE these medications         acetaminophen 325 MG tablet   Commonly known as: TYLENOL   Take 2 tablets (650 mg total)  by mouth every 4 (four) hours as needed.      traMADol 50 MG tablet   Commonly known as: ULTRAM   Take 1 tablet (50 mg total) by mouth every 6 (six) hours as needed for pain.           Follow-up Information    Call [facility], MD. (To be seen in 2- 3 weeks.)    Contact information:   [address] [place] [address] [phone]           Pamella Pert, MD 07/28/2012, 6:28 PM  Pager: [phone] Office: [phone] If no answer: [phone]

## 2012-08-14 ENCOUNTER — Emergency Department (HOSPITAL_COMMUNITY): Payer: Self-pay

## 2012-08-14 ENCOUNTER — Emergency Department (HOSPITAL_COMMUNITY)
Admission: EM | Admit: 2012-08-14 | Discharge: 2012-08-14 | Disposition: A | Discharge status: Home / Self Care | Payer: Self-pay | Attending: Emergency Medicine | Admitting: Emergency Medicine

## 2012-08-14 ENCOUNTER — Encounter (HOSPITAL_COMMUNITY): Payer: Self-pay

## 2012-08-14 DIAGNOSIS — R0789 Other chest pain: Secondary | ICD-10-CM

## 2012-08-14 DIAGNOSIS — R05 Cough: Secondary | ICD-10-CM | POA: Insufficient documentation

## 2012-08-14 DIAGNOSIS — R062 Wheezing: Secondary | ICD-10-CM | POA: Insufficient documentation

## 2012-08-14 DIAGNOSIS — R1013 Epigastric pain: Secondary | ICD-10-CM | POA: Insufficient documentation

## 2012-08-14 DIAGNOSIS — R071 Chest pain on breathing: Secondary | ICD-10-CM | POA: Insufficient documentation

## 2012-08-14 DIAGNOSIS — K08109 Complete loss of teeth, unspecified cause, unspecified class: Secondary | ICD-10-CM | POA: Insufficient documentation

## 2012-08-14 DIAGNOSIS — F17200 Nicotine dependence, unspecified, uncomplicated: Secondary | ICD-10-CM | POA: Insufficient documentation

## 2012-08-14 DIAGNOSIS — R110 Nausea: Secondary | ICD-10-CM | POA: Insufficient documentation

## 2012-08-14 DIAGNOSIS — R0602 Shortness of breath: Secondary | ICD-10-CM | POA: Insufficient documentation

## 2012-08-14 DIAGNOSIS — R059 Cough, unspecified: Secondary | ICD-10-CM | POA: Insufficient documentation

## 2012-08-14 DIAGNOSIS — Z9889 Other specified postprocedural states: Secondary | ICD-10-CM | POA: Insufficient documentation

## 2012-08-14 LAB — BASIC METABOLIC PANEL
CO2: 27 mEq/L (ref 19–32)
Calcium: 8.9 mg/dL (ref 8.4–10.5)
Chloride: 102 mEq/L (ref 96–112)
Glucose, Bld: 96 mg/dL (ref 70–99)
Potassium: 4.4 mEq/L (ref 3.5–5.1)
Sodium: 139 mEq/L (ref 135–145)

## 2012-08-14 LAB — CBC
Hemoglobin: 15.1 g/dL (ref 13.0–17.0)
Platelets: 190 10*3/uL (ref 150–400)
RBC: 4.42 MIL/uL (ref 4.22–5.81)
WBC: 4.5 10*3/uL (ref 4.0–10.5)

## 2012-08-14 LAB — POCT I-STAT TROPONIN I

## 2012-08-14 LAB — D-DIMER, QUANTITATIVE: D-Dimer, Quant: <0.27 ug/mL-FEU (ref 0.00–0.48)

## 2012-08-14 LAB — ETHANOL: Alcohol, Ethyl (B): 76 mg/dL — ABNORMAL HIGH (ref 0–11)

## 2012-08-14 LAB — PRO B NATRIURETIC PEPTIDE: Pro B Natriuretic peptide (BNP): 37 pg/mL (ref 0–125)

## 2012-08-14 MED ORDER — ASPIRIN 325 MG PO TABS
325.0000 mg | ORAL_TABLET | ORAL | Status: AC
  Administered 2012-08-14: 325 mg via ORAL
  Filled 2012-08-14: qty 1

## 2012-08-14 MED ORDER — ONDANSETRON HCL 4 MG/2ML IJ SOLN
4.0000 mg | Freq: Once | INTRAMUSCULAR | Status: AC
  Administered 2012-08-14: 4 mg via INTRAVENOUS
  Filled 2012-08-14: qty 2

## 2012-08-14 MED ORDER — MORPHINE SULFATE 4 MG/ML IJ SOLN
4.0000 mg | INTRAMUSCULAR | Status: DC | PRN
  Administered 2012-08-14: 4 mg via INTRAVENOUS
  Filled 2012-08-14: qty 1

## 2012-08-14 NOTE — ED Notes (Signed)
Per [facility], pt c/o sudden onset at 1800 left cp with radiation in left arm. SOB with productive cough with clear sputum. ETOH on board, small amount. 324 mg ASA, @ NTG without relief. #20 to [facility]. Hurts to take deep breaths. Hx of MI x 3, no stents.

## 2012-08-14 NOTE — ED Notes (Signed)
Discharge and follow up instructions reviewed. Pt verbalized understanding.  

## 2012-08-14 NOTE — ED Provider Notes (Signed)
History     CSN: [id number]  Arrival date & time 08/14/12  4098   First MD Initiated Contact with Patient 08/14/12 1908     Chief Complaint  Patient presents with  . Chest Pain  . Cough    HPI Comments: Pt is a 52 yo M with PMH of recurrent CP presenting with acute onset CP one hour prior to arrival. Pain is substernal; he first noticed it when lying flat. He states his stress makes it worse. Nothing makes it better. Also has cough and SOB.  Patient is a 52 y.o. male presenting with chest pain and cough. The history is provided by the patient.  Chest Pain Pain location:  Substernal area Pain quality: pressure and sharp   Pain radiates to:  L arm Pain radiates to the back: no   Pain severity:  Moderate Onset quality:  Sudden Duration:  1 hour Timing:  Constant Progression:  Unchanged Chronicity:  Recurrent Context: at rest   Relieved by:  Nothing Worsened by:  Nothing tried Ineffective treatments:  Nitroglycerin Associated symptoms: cough, nausea and shortness of breath   Associated symptoms: no fever   Risk factors: male sex and smoking   Cough Associated symptoms: chest pain and shortness of breath   Associated symptoms: no fever     Past Medical History  Diagnosis Date  . MI, old     Past Surgical History  Procedure Laterality Date  . Hernia repair      Family History  Problem Relation Age of Onset  . Diabetes Mellitus II Mother   . Diabetes Mellitus II Father     History  Substance Use Topics  . Smoking status: Current Every Day Smoker    Types: Cigarettes  . Smokeless tobacco: Not on file  . Alcohol Use: Yes     Comment: occ     Review of Systems  Constitutional: Negative for fever.  Respiratory: Positive for cough and shortness of breath.   Cardiovascular: Positive for chest pain.  Gastrointestinal: Positive for nausea.  All other systems reviewed and are negative.    Allergies  Penicillins  Home Medications  No current outpatient  prescriptions on file.  BP 115/69  Pulse 69  Temp(Src) 98.1 F (36.7 C) (Oral)  Resp 17  SpO2 99%  Physical Exam  Constitutional: He is oriented to person, place, and time. He appears well-developed and well-nourished. No distress.  HENT:  Head: Normocephalic and atraumatic.  Mouth/Throat: Oropharynx is clear and moist and mucous membranes are normal. Abnormal dentition (Missing multiple teeth).  Eyes: Pupils are equal, round, and reactive to light.  Neck: Normal range of motion. Neck supple.  Cardiovascular: Normal rate, regular rhythm, normal heart sounds and intact distal pulses.   Pulmonary/Chest: Effort normal. He has wheezes (Expiratory). He exhibits tenderness (TTP left chest, reproducible pain).  Abdominal: Soft. There is tenderness (Epigastric). There is no rebound and no guarding.  Musculoskeletal: Normal range of motion. He exhibits no edema and no tenderness.  Redness of LLE from ankle to mid-shin, pt reports it is intermittent. Nontender. No calf tenderness  Lymphadenopathy:    He has no cervical adenopathy.  Neurological: He is alert and oriented to person, place, and time. No cranial nerve deficit. Coordination normal.  Skin: Skin is warm and dry. He is not diaphoretic.    ED Course  Procedures (including critical care time)  Labs Reviewed  CBC - Abnormal; Notable for the following:    MCH 34.2 (*)    MCHC  36.7 (*)    All other components within normal limits  ETHANOL - Abnormal; Notable for the following:    Alcohol, Ethyl (B) 76 (*)    All other components within normal limits  BASIC METABOLIC PANEL  PRO B NATRIURETIC PEPTIDE  D-DIMER, QUANTITATIVE  URINE RAPID DRUG SCREEN (HOSP PERFORMED)  POCT I-STAT TROPONIN I   Dg Chest Port 1 View  08/14/2012  *RADIOLOGY REPORT*  Clinical Data: Chest pain.  Cough.  PORTABLE CHEST - 1 VIEW  Comparison: 07/22/2012  Findings: Low lung volumes noted with left basilar subsegmental atelectasis. Cardiac and mediastinal  contours appear unremarkable.  Mild airway thickening suggest bronchitis or reactive airways disease.  IMPRESSION:  1. Airway thickening may reflect bronchitis or reactive airways disease. 2.  Left basilar subsegmental atelectasis.   Original Report Authenticated By: Gaylyn Rong, M.D.     1. Chest wall pain    MDM  Pt is a 52 yo M with intermittent chest pain, presenting with acute onset CP, SOB and nausea. Pt had cardiac cath less than one month ago, which was normal. He has had one episode of CP since that time with negative work up.   Will check troponin, CBC, Bmet, pro-BNP, etoh level and drug screen. Will give Zofran and Morphine for symptoms.  D.dimer ordered since CXR showed atelectasis and he reports pain with deep breath. D.dimer negative.   2215- Pt comfortable and sleeping. States he feels well enough to go home. Since patient had recent cardiac cath that was normal and his work up here was normal he will be d/c home in stable condition. He should follow up with his cardiologist, or return to the ED for worsening symptoms.    Hilarie Fredrickson, MD 08/14/12 2234

## 2012-08-14 NOTE — ED Notes (Signed)
Pt undressed, in gown, on monitor, continuous pulse oximetry and blood pressure cuff 

## 2012-08-14 NOTE — ED Notes (Signed)
Patient asked for and received a warm blanket. 

## 2012-08-15 NOTE — ED Provider Notes (Signed)
I saw and evaluated the patient, reviewed the resident's note and I agree with the findings and plan. Pt with left lateral cp, sharp, worse w movement and palpation. Rare non prod cough. No fever or chills. No pleuritic pain. Chest cta. +chest wall tenderness.   Suzi Roots, MD 08/15/12 1146

## 2012-12-09 ENCOUNTER — Emergency Department (HOSPITAL_COMMUNITY)
Admission: EM | Admit: 2012-12-09 | Discharge: 2012-12-10 | Disposition: A | Discharge status: Home / Self Care | Payer: Self-pay | Attending: Emergency Medicine | Admitting: Emergency Medicine

## 2012-12-09 DIAGNOSIS — R0602 Shortness of breath: Secondary | ICD-10-CM | POA: Insufficient documentation

## 2012-12-09 DIAGNOSIS — I252 Old myocardial infarction: Secondary | ICD-10-CM | POA: Insufficient documentation

## 2012-12-09 DIAGNOSIS — F17200 Nicotine dependence, unspecified, uncomplicated: Secondary | ICD-10-CM | POA: Insufficient documentation

## 2012-12-09 DIAGNOSIS — R071 Chest pain on breathing: Secondary | ICD-10-CM | POA: Insufficient documentation

## 2012-12-09 DIAGNOSIS — R0789 Other chest pain: Secondary | ICD-10-CM | POA: Insufficient documentation

## 2012-12-09 LAB — CBC WITH DIFFERENTIAL/PLATELET
Basophils Absolute: 0 10*3/uL (ref 0.0–0.1)
Eosinophils Absolute: 0.1 10*3/uL (ref 0.0–0.7)
Eosinophils Relative: 1 % (ref 0–5)
Hemoglobin: 14.6 g/dL (ref 13.0–17.0)
Lymphs Abs: 1.8 10*3/uL (ref 0.7–4.0)
MCHC: 35.8 g/dL (ref 30.0–36.0)
Monocytes Absolute: 0.3 10*3/uL (ref 0.1–1.0)
Neutrophils Relative %: 39 % — ABNORMAL LOW (ref 43–77)
RBC: 4.32 MIL/uL (ref 4.22–5.81)
WBC: 3.6 10*3/uL — ABNORMAL LOW (ref 4.0–10.5)

## 2012-12-09 MED ORDER — ONDANSETRON HCL 4 MG/2ML IJ SOLN
4.0000 mg | Freq: Once | INTRAMUSCULAR | Status: AC
  Administered 2012-12-09: 4 mg via INTRAVENOUS
  Filled 2012-12-09: qty 2

## 2012-12-09 MED ORDER — GI COCKTAIL ~~LOC~~
30.0000 mL | Freq: Once | ORAL | Status: AC
  Administered 2012-12-09: 30 mL via ORAL
  Filled 2012-12-09: qty 30

## 2012-12-09 MED ORDER — MORPHINE SULFATE 4 MG/ML IJ SOLN
4.0000 mg | Freq: Once | INTRAMUSCULAR | Status: AC
  Administered 2012-12-09: 4 mg via INTRAVENOUS
  Filled 2012-12-09: qty 1

## 2012-12-09 NOTE — ED Provider Notes (Signed)
History     CSN: [id number]  Arrival date & time 12/09/12  2223   First MD Initiated Contact with Patient 12/09/12 2258      Chief Complaint  Patient presents with  . Chest Pain    (Consider location/radiation/quality/duration/timing/severity/associated sxs/prior treatment) HPI Comments: Patient presents with one hour sharp stabbing chest pain is substernal and does not radiate. It has been constant. It is worse with palpation. Associated with shortness of breath and nausea. He states he's had this pain before. Denies having any stents or a previous MIs and states he has a "weak heart". Denies any leg pain or swelling. The pain is in the center of his chest and does not radiate. It is worse with palpation and worse with deep breathing. Nothing makes it better. At onset at rest while he was drinking alcohol after dinner.  The history is provided by the patient.    Past Medical History  Diagnosis Date  . MI, old     Past Surgical History  Procedure Laterality Date  . Hernia repair      Family History  Problem Relation Age of Onset  . Diabetes Mellitus II Mother   . Diabetes Mellitus II Father     History  Substance Use Topics  . Smoking status: Current Every Day Smoker    Types: Cigarettes  . Smokeless tobacco: Not on file  . Alcohol Use: Yes     Comment: occ      Review of Systems  Constitutional: Negative for activity change and appetite change.  HENT: Negative for congestion and rhinorrhea.   Respiratory: Positive for chest tightness and shortness of breath. Negative for cough.   Cardiovascular: Positive for chest pain.  Gastrointestinal: Negative for nausea, vomiting and abdominal pain.  Genitourinary: Negative for dysuria and hematuria.  Musculoskeletal: Negative for back pain.  Skin: Negative for rash.  Neurological: Negative for dizziness, weakness and headaches.  A complete 10 system review of systems was obtained and all systems are negative except as  noted in the HPI and PMH.    Allergies  Penicillins  Home Medications  No current outpatient prescriptions on file.  BP 125/81  Pulse 81  Temp(Src) 98.1 F (36.7 C) (Oral)  Resp 13  SpO2 98%  Physical Exam  Constitutional: He is oriented to person, place, and time. He appears well-developed and well-nourished. No distress.  HENT:  Head: Normocephalic and atraumatic.  Mouth/Throat: Oropharynx is clear and moist. No oropharyngeal exudate.  Eyes: Conjunctivae and EOM are normal. Pupils are equal, round, and reactive to light.  Neck: Normal range of motion. Neck supple.  Cardiovascular: Normal rate, regular rhythm and normal heart sounds.   No murmur heard. Pulmonary/Chest: Effort normal and breath sounds normal. No respiratory distress. He exhibits tenderness.  Substernal chest tenderness reproducible to palpation  Abdominal: Soft. There is no tenderness. There is no rebound and no guarding.  Musculoskeletal: Normal range of motion. He exhibits no edema and no tenderness.  Neurological: He is alert and oriented to person, place, and time. No cranial nerve deficit. He exhibits normal muscle tone. Coordination normal.  CN 2-12 intact, no ataxia on finger to nose, no nystagmus, 5/5 strength throughout, no pronator drift, Romberg negative, normal gait.   Skin: Skin is warm.    ED Course  Procedures (including critical care time)  Labs Reviewed  CBC WITH DIFFERENTIAL - Abnormal; Notable for the following:    WBC 3.6 (*)    Neutrophils Relative % 39 (*)  Neutro Abs 1.4 (*)    Lymphocytes Relative 50 (*)    All other components within normal limits  COMPREHENSIVE METABOLIC PANEL - Abnormal; Notable for the following:    Glucose, Bld 110 (*)    Total Bilirubin 0.2 (*)    All other components within normal limits  TROPONIN I  D-DIMER, QUANTITATIVE  TROPONIN I   Dg Chest 2 View  12/10/2012   *RADIOLOGY REPORT*  Clinical Data: Sudden onset chest pain 07/1988, history MI   CHEST - 2 VIEW  Comparison: 08/14/2012  Findings: Normal heart size, mediastinal contours, and pulmonary vascularity. Lungs clear. Minimal chronic peribronchial thickening. No acute infiltrate, pleural effusion or pneumothorax. Bones unremarkable.  IMPRESSION: No acute abnormalities.   Original Report Authenticated By: Ulyses Southward, M.D.     1. Chest wall pain       MDM  One hour of substernal chest pain that is constant and worse with palpation deep breathing. EKG unchanged.  Patient with cardiac catheterization in January 2014 it revealed normal coronary arteries  CXR negative.  Troponin negative x 2.  Clean catheterization 5 months ago.  Low suspicion for ACS. D-dimer negative. Chest pain reproducible to palpation and appears to be musculoskeletal.     Date: 12/09/2012  Rate: 77  Rhythm: normal sinus rhythm  QRS Axis: normal  Intervals: normal  ST/T Wave abnormalities: normal  Conduction Disutrbances:none  Narrative Interpretation:   Old EKG Reviewed: unchanged    Glynn Octave, MD 12/10/12 [phone]

## 2012-12-09 NOTE — ED Notes (Signed)
Pt smells strongly of ETOH admits to drinking wine tonight

## 2012-12-09 NOTE — ED Notes (Signed)
Pt cooperative. Rates cp at 7/10

## 2012-12-09 NOTE — ED Notes (Signed)
PT reports

## 2012-12-09 NOTE — ED Notes (Signed)
EKG completed and given to Dr. Manus Gunning.

## 2012-12-10 ENCOUNTER — Emergency Department (HOSPITAL_COMMUNITY): Payer: Self-pay

## 2012-12-10 LAB — COMPREHENSIVE METABOLIC PANEL
ALT: 21 U/L (ref 0–53)
CO2: 22 mEq/L (ref 19–32)
Calcium: 8.8 mg/dL (ref 8.4–10.5)
Creatinine, Ser: 0.82 mg/dL (ref 0.50–1.35)
GFR calc Af Amer: >90 mL/min (ref >90)
GFR calc non Af Amer: >90 mL/min (ref >90)
Glucose, Bld: 110 mg/dL — ABNORMAL HIGH (ref 70–99)
Sodium: 140 mEq/L (ref 135–145)
Total Protein: 6.9 g/dL (ref 6.0–8.3)

## 2012-12-10 LAB — TROPONIN I: Troponin I: <0.3 ng/mL (ref <0.30)

## 2012-12-10 LAB — D-DIMER, QUANTITATIVE: D-Dimer, Quant: <0.27 ug/mL-FEU (ref 0.00–0.48)

## 2013-01-15 ENCOUNTER — Emergency Department (HOSPITAL_COMMUNITY)
Admission: EM | Admit: 2013-01-15 | Discharge: 2013-01-15 | Disposition: A | Discharge status: Home / Self Care | Payer: Self-pay | Attending: Emergency Medicine | Admitting: Emergency Medicine

## 2013-01-15 ENCOUNTER — Encounter (HOSPITAL_COMMUNITY): Payer: Self-pay

## 2013-01-15 ENCOUNTER — Emergency Department (HOSPITAL_COMMUNITY): Payer: Self-pay

## 2013-01-15 DIAGNOSIS — F17200 Nicotine dependence, unspecified, uncomplicated: Secondary | ICD-10-CM | POA: Insufficient documentation

## 2013-01-15 DIAGNOSIS — Z880 Allergy status to penicillin: Secondary | ICD-10-CM | POA: Insufficient documentation

## 2013-01-15 DIAGNOSIS — R0602 Shortness of breath: Secondary | ICD-10-CM | POA: Insufficient documentation

## 2013-01-15 DIAGNOSIS — R0789 Other chest pain: Secondary | ICD-10-CM

## 2013-01-15 DIAGNOSIS — R071 Chest pain on breathing: Secondary | ICD-10-CM | POA: Insufficient documentation

## 2013-01-15 DIAGNOSIS — I252 Old myocardial infarction: Secondary | ICD-10-CM | POA: Insufficient documentation

## 2013-01-15 LAB — POCT I-STAT, CHEM 8
BUN: 21 mg/dL (ref 6–23)
Calcium, Ion: 1.1 mmol/L — ABNORMAL LOW (ref 1.12–1.23)
Chloride: 105 mEq/L (ref 96–112)
Chloride: 109 mEq/L (ref 96–112)
Creatinine, Ser: 1.2 mg/dL (ref 0.50–1.35)
Glucose, Bld: 96 mg/dL (ref 70–99)
HCT: 45 % (ref 39.0–52.0)
HCT: 46 % (ref 39.0–52.0)
Hemoglobin: 15.3 g/dL (ref 13.0–17.0)
Hemoglobin: 15.6 g/dL (ref 13.0–17.0)
Potassium: 3.9 mEq/L (ref 3.5–5.1)
Potassium: 4.6 mEq/L (ref 3.5–5.1)
Sodium: 138 mEq/L (ref 135–145)
Sodium: 140 mEq/L (ref 135–145)
TCO2: 22 mmol/L (ref 0–100)

## 2013-01-15 LAB — CBC
HCT: 42.3 % (ref 39.0–52.0)
MCHC: 35.2 g/dL (ref 30.0–36.0)
Platelets: 145 10*3/uL — ABNORMAL LOW (ref 150–400)
RDW: 13.2 % (ref 11.5–15.5)
WBC: 4.9 10*3/uL (ref 4.0–10.5)

## 2013-01-15 LAB — POCT I-STAT TROPONIN I
Troponin i, poc: 0.01 ng/mL (ref 0.00–0.08)
Troponin i, poc: 0 ng/mL (ref 0.00–0.08)

## 2013-01-15 LAB — D-DIMER, QUANTITATIVE: D-Dimer, Quant: <0.27 ug/mL-FEU (ref 0.00–0.48)

## 2013-01-15 MED ORDER — ONDANSETRON HCL 4 MG/2ML IJ SOLN
4.0000 mg | Freq: Once | INTRAMUSCULAR | Status: AC
  Administered 2013-01-15: 4 mg via INTRAVENOUS
  Filled 2013-01-15: qty 2

## 2013-01-15 MED ORDER — SODIUM CHLORIDE 0.9 % IV BOLUS (SEPSIS)
1000.0000 mL | Freq: Once | INTRAVENOUS | Status: AC
  Administered 2013-01-15: 1000 mL via INTRAVENOUS

## 2013-01-15 MED ORDER — FAMOTIDINE 20 MG PO TABS
20.0000 mg | ORAL_TABLET | Freq: Once | ORAL | Status: AC
  Administered 2013-01-15: 20 mg via ORAL
  Filled 2013-01-15: qty 1

## 2013-01-15 MED ORDER — PANTOPRAZOLE SODIUM 40 MG IV SOLR
40.0000 mg | Freq: Once | INTRAVENOUS | Status: AC
  Administered 2013-01-15: 40 mg via INTRAVENOUS
  Filled 2013-01-15: qty 40

## 2013-01-15 MED ORDER — NITROGLYCERIN 0.4 MG SL SUBL
0.4000 mg | SUBLINGUAL_TABLET | SUBLINGUAL | Status: DC | PRN
  Filled 2013-01-15: qty 25

## 2013-01-15 MED ORDER — ASPIRIN 81 MG PO CHEW
324.0000 mg | CHEWABLE_TABLET | Freq: Once | ORAL | Status: AC
  Administered 2013-01-15: 324 mg via ORAL
  Filled 2013-01-15: qty 4

## 2013-01-15 MED ORDER — FENTANYL CITRATE 0.05 MG/ML IJ SOLN
50.0000 ug | INTRAMUSCULAR | Status: DC | PRN
  Administered 2013-01-15 (×3): 50 ug via INTRAVENOUS
  Filled 2013-01-15 (×2): qty 2

## 2013-01-15 MED ORDER — FAMOTIDINE 20 MG PO TABS: 20.0000 mg | ORAL_TABLET | Freq: Two times a day (BID) | ORAL | Status: DC

## 2013-01-15 NOTE — ED Provider Notes (Signed)
CSN: [id number]     Arrival date & time 01/15/13  0013 History     First MD Initiated Contact with Patient 01/15/13 0015     Chief Complaint  Patient presents with  . Chest Pain  . Shortness of Breath   (Consider location/radiation/quality/duration/timing/severity/associated sxs/prior Treatment) HPI Hx per patient  Sharp substernal pain, hurts to move and take a deep breath, states he has some associated SOB. No F/C, no cough, had one episode tonight that felt like it hurt to swallow, none since and no heartburn or h/o same. He denies sensation of food getting stuck. Symptoms MOD in severity Past Medical History  Diagnosis Date  . MI, old    Past Surgical History  Procedure Laterality Date  . Hernia repair     Family History  Problem Relation Age of Onset  . Diabetes Mellitus II Mother   . Diabetes Mellitus II Father    History  Substance Use Topics  . Smoking status: Current Every Day Smoker    Types: Cigarettes  . Smokeless tobacco: Not on file  . Alcohol Use: Yes     Comment: occ    Review of Systems  Constitutional: Negative for fever and chills.  HENT: Negative for neck pain and neck stiffness.   Eyes: Negative for pain.  Respiratory: Negative for cough and wheezing.   Cardiovascular: Positive for chest pain.  Gastrointestinal: Negative for abdominal pain.  Genitourinary: Negative for dysuria.  Musculoskeletal: Negative for back pain.  Skin: Negative for rash.  Neurological: Negative for headaches.  All other systems reviewed and are negative.    Allergies  Penicillins  Home Medications  No current outpatient prescriptions on file. BP 111/68  Pulse 89  Temp(Src) 98 F (36.7 C)  Resp 16  SpO2 96% Physical Exam  Constitutional: He is oriented to person, place, and time. He appears well-developed and well-nourished.  HENT:  Head: Normocephalic and atraumatic.  Eyes: EOM are normal. Pupils are equal, round, and reactive to light.  Neck: Neck  supple.  Cardiovascular: Normal rate, regular rhythm and intact distal pulses.   Pulmonary/Chest: Effort normal and breath sounds normal. No respiratory distress.  reproducible anterior chest wall TTP, no crepitus or rash  Abdominal: Soft. Bowel sounds are normal. He exhibits no distension. There is no tenderness.  Musculoskeletal: Normal range of motion. He exhibits no edema and no tenderness.  Neurological: He is alert and oriented to person, place, and time.  Skin: Skin is warm and dry.    ED Course   Procedures (including critical care time)  Results for orders placed during the hospital encounter of 01/15/13  CBC      Result Value Range   WBC 4.9  4.0 - 10.5 K/uL   RBC 4.48  4.22 - 5.81 MIL/uL   Hemoglobin 14.9  13.0 - 17.0 g/dL   HCT 13.0  86.5 - 78.4 %   MCV 94.4  78.0 - 100.0 fL   MCH 33.3  26.0 - 34.0 pg   MCHC 35.2  30.0 - 36.0 g/dL   RDW 69.6  29.5 - 28.4 %   Platelets 145 (*) 150 - 400 K/uL  D-DIMER, QUANTITATIVE      Result Value Range   D-Dimer, Quant <0.27  0.00 - 0.48 ug/mL-FEU  POCT I-STAT, CHEM 8      Result Value Range   Sodium 138  135 - 145 mEq/L   Potassium 3.9  3.5 - 5.1 mEq/L   Chloride 105  96 - 112  mEq/L   BUN 25 (*) 6 - 23 mg/dL   Creatinine, Ser 6.21 (*) 0.50 - 1.35 mg/dL   Glucose, Bld 308 (*) 70 - 99 mg/dL   Calcium, Ion 6.57  8.46 - 1.23 mmol/L   TCO2 21  0 - 100 mmol/L   Hemoglobin 15.6  13.0 - 17.0 g/dL   HCT 96.2  95.2 - 84.1 %  POCT I-STAT TROPONIN I      Result Value Range   Troponin i, poc 0.01  0.00 - 0.08 ng/mL   Comment 3           POCT I-STAT TROPONIN I      Result Value Range   Troponin i, poc 0.00  0.00 - 0.08 ng/mL   Comment 3           POCT I-STAT, CHEM 8      Result Value Range   Sodium 140  135 - 145 mEq/L   Potassium 4.6  3.5 - 5.1 mEq/L   Chloride 109  96 - 112 mEq/L   BUN 21  6 - 23 mg/dL   Creatinine, Ser 3.24  0.50 - 1.35 mg/dL   Glucose, Bld 96  70 - 99 mg/dL   Calcium, Ion 4.01 (*) 1.12 - 1.23 mmol/L    TCO2 22  0 - 100 mmol/L   Hemoglobin 15.3  13.0 - 17.0 g/dL   HCT 02.7  25.3 - 66.4 %   Dg Chest Portable 1 View  01/15/2013   *RADIOLOGY REPORT*  Clinical Data: Chest pain and shortness of breath.  CHEST - 1 VIEW  Comparison:  12/10/2012  Findings: The heart size and mediastinal contours are within normal limits.  Both lungs are clear.  IMPRESSION: No active disease.   Original Report Authenticated By: Irish Lack, M.D.   IVfs, protonix, NTG and fentanyl provided   Date: 01/15/2013  Rate: 90  Rhythm: normal sinus rhythm  QRS Axis: normal  Intervals: normal  ST/T Wave abnormalities: nonspecific ST changes  Conduction Disutrbances:none  Narrative Interpretation:   Old EKG Reviewed: unchanged  Dx: Chest pain  Plan d/c home, outpatient referrals, pepcid RX and strict return precautions provided  MDM  CP with risk factors for ACS, neg cath Jan this year ECG, CXR, labs - neg Trop x 2 crt double versus old labs, was given IVFs and crt normalized     Sunnie Nielsen, MD 01/15/13 0700

## 2013-01-15 NOTE — ED Notes (Addendum)
0020  Pt arrives via EMS with CP and SOB.  Pt states he was leaving a friends house and walking to [place] and started having SOB and CP.  States he has been having CP for 2 weeks thinking it would go away but tonight got worse.  Pt was given 0.4 nitro by EMS and did not help.    0130  Pt resting at this time.    0230  Pt is asleep at this time  0400  Pt is still C/O pain along the sternum.    0515  Pt feeling better than he was earlier.

## 2013-03-26 ENCOUNTER — Emergency Department (HOSPITAL_COMMUNITY)
Admission: EM | Admit: 2013-03-26 | Discharge: 2013-03-27 | Disposition: A | Discharge status: Home / Self Care | Payer: Self-pay | Attending: Emergency Medicine | Admitting: Emergency Medicine

## 2013-03-26 ENCOUNTER — Other Ambulatory Visit: Payer: Self-pay

## 2013-03-26 ENCOUNTER — Emergency Department (HOSPITAL_COMMUNITY): Payer: Self-pay

## 2013-03-26 DIAGNOSIS — F17200 Nicotine dependence, unspecified, uncomplicated: Secondary | ICD-10-CM | POA: Insufficient documentation

## 2013-03-26 DIAGNOSIS — Z880 Allergy status to penicillin: Secondary | ICD-10-CM | POA: Insufficient documentation

## 2013-03-26 DIAGNOSIS — I252 Old myocardial infarction: Secondary | ICD-10-CM | POA: Insufficient documentation

## 2013-03-26 DIAGNOSIS — R0602 Shortness of breath: Secondary | ICD-10-CM | POA: Insufficient documentation

## 2013-03-26 DIAGNOSIS — Z79899 Other long term (current) drug therapy: Secondary | ICD-10-CM | POA: Insufficient documentation

## 2013-03-26 DIAGNOSIS — R1084 Generalized abdominal pain: Secondary | ICD-10-CM | POA: Insufficient documentation

## 2013-03-26 DIAGNOSIS — J449 Chronic obstructive pulmonary disease, unspecified: Secondary | ICD-10-CM

## 2013-03-26 DIAGNOSIS — R062 Wheezing: Secondary | ICD-10-CM | POA: Insufficient documentation

## 2013-03-26 DIAGNOSIS — R071 Chest pain on breathing: Secondary | ICD-10-CM | POA: Insufficient documentation

## 2013-03-26 DIAGNOSIS — J4489 Other specified chronic obstructive pulmonary disease: Secondary | ICD-10-CM | POA: Insufficient documentation

## 2013-03-26 DIAGNOSIS — R0789 Other chest pain: Secondary | ICD-10-CM | POA: Insufficient documentation

## 2013-03-26 DIAGNOSIS — J9801 Acute bronchospasm: Secondary | ICD-10-CM | POA: Insufficient documentation

## 2013-03-26 LAB — COMPREHENSIVE METABOLIC PANEL
ALT: 29 U/L (ref 0–53)
AST: 30 U/L (ref 0–37)
CO2: 19 mEq/L (ref 19–32)
Chloride: 104 mEq/L (ref 96–112)
GFR calc non Af Amer: >90 mL/min (ref >90)
Sodium: 138 mEq/L (ref 135–145)
Total Bilirubin: 0.2 mg/dL — ABNORMAL LOW (ref 0.3–1.2)

## 2013-03-26 LAB — CBC
MCV: 93.3 fL (ref 78.0–100.0)
Platelets: 180 10*3/uL (ref 150–400)
RBC: 4.16 MIL/uL — ABNORMAL LOW (ref 4.22–5.81)
WBC: 4.4 10*3/uL (ref 4.0–10.5)

## 2013-03-26 MED ORDER — ALBUTEROL SULFATE (5 MG/ML) 0.5% IN NEBU
5.0000 mg | INHALATION_SOLUTION | Freq: Once | RESPIRATORY_TRACT | Status: AC
  Administered 2013-03-26: 5 mg via RESPIRATORY_TRACT
  Filled 2013-03-26: qty 1

## 2013-03-26 MED ORDER — KETOROLAC TROMETHAMINE 30 MG/ML IJ SOLN
30.0000 mg | Freq: Once | INTRAMUSCULAR | Status: AC
  Administered 2013-03-26: 30 mg via INTRAVENOUS
  Filled 2013-03-26: qty 1

## 2013-03-26 NOTE — ED Provider Notes (Signed)
CSN: [id number]     Arrival date & time 03/26/13  2246 History   First MD Initiated Contact with Patient 03/26/13 2252     Chief Complaint  Patient presents with  . Chest Pain  . Abdominal Pain   (Consider location/radiation/quality/duration/timing/severity/associated sxs/prior Treatment) HPI Comments: 52 y/o male with no significant PMHx presents to the ED complaining of sudden onset mid-sternal chest pain beginning less than 1 hour prior to arrival while patient was in the middle of drinking a beer after dinner. Pain described as a tightness, non-radiating rated 8/10. He immediately called EMS and was given 324 mg aspirin and 3 and nitroglycerin without relief of his symptoms. Admits to associated shortness of breath. Denies nausea, vomiting or diaphoresis. Also complaining of lower abdominal pain beginning at the same time, described as cramping. States he has had similar chest pain in the past, however states "no one is ever able to tell me what it is". He states this does not feel like heartburn. Smokes 1 pack of cigarettes every 2 days.  Patient is a 52 y.o. male presenting with chest pain and abdominal pain. The history is provided by the patient.  Chest Pain Associated symptoms: abdominal pain and shortness of breath   Associated symptoms: no back pain, no cough, no diaphoresis, no dizziness, no fever, no nausea, not vomiting and no weakness   Abdominal Pain Associated symptoms: chest pain and shortness of breath   Associated symptoms: no chills, no constipation, no cough, no diarrhea, no fever, no nausea and no vomiting     Past Medical History  Diagnosis Date  . MI, old    Past Surgical History  Procedure Laterality Date  . Hernia repair     Family History  Problem Relation Age of Onset  . Diabetes Mellitus II Mother   . Diabetes Mellitus II Father    History  Substance Use Topics  . Smoking status: Current Every Day Smoker    Types: Cigarettes  . Smokeless tobacco: Not  on file  . Alcohol Use: Yes     Comment: occ    Review of Systems  Constitutional: Negative for fever, chills and diaphoresis.  Respiratory: Positive for shortness of breath. Negative for cough and wheezing.   Cardiovascular: Positive for chest pain.  Gastrointestinal: Positive for abdominal pain. Negative for nausea, vomiting, diarrhea and constipation.  Musculoskeletal: Negative for back pain.  Neurological: Negative for dizziness, syncope, weakness and light-headedness.  All other systems reviewed and are negative.    Allergies  Penicillins  Home Medications   Current Outpatient Rx  Name  Route  Sig  Dispense  Refill  . famotidine (PEPCID) 20 MG tablet   Oral   Take 1 tablet (20 mg total) by mouth 2 (two) times daily.   30 tablet   0    SpO2 97% Physical Exam  Nursing note and vitals reviewed. Constitutional: He is oriented to person, place, and time. He appears well-developed and well-nourished. No distress.  HENT:  Head: Normocephalic and atraumatic.  Mouth/Throat: Oropharynx is clear and moist.  Eyes: Conjunctivae and EOM are normal. Pupils are equal, round, and reactive to light.  Neck: Normal range of motion. Neck supple.  Cardiovascular: Normal rate, regular rhythm, normal heart sounds and intact distal pulses.   Pulmonary/Chest: Effort normal. No respiratory distress. He has no decreased breath sounds. He has wheezes (scattered expiratory). He exhibits tenderness (over entire anterior chest).  Abdominal: Soft. Normal appearance and bowel sounds are normal. He exhibits no distension  and no mass. There is generalized tenderness. There is no rigidity, no rebound and no guarding.  No peritoneal signs.  Musculoskeletal: Normal range of motion. He exhibits no edema.  Neurological: He is alert and oriented to person, place, and time. He has normal strength. No sensory deficit.  Skin: Skin is warm and dry. He is not diaphoretic.  Psychiatric: He has a normal mood and  affect. His behavior is normal.    ED Course  Procedures (including critical care time) Labs Review Labs Reviewed  CBC - Abnormal; Notable for the following:    RBC 4.16 (*)    HCT 38.8 (*)    MCH 34.4 (*)    MCHC 36.9 (*)    All other components within normal limits  COMPREHENSIVE METABOLIC PANEL - Abnormal; Notable for the following:    Calcium 8.3 (*)    Albumin 3.1 (*)    Total Bilirubin 0.2 (*)    All other components within normal limits  POCT I-STAT TROPONIN I    Date: 03/26/2013  Rate: 84  Rhythm: normal sinus rhythm  QRS Axis: normal  Intervals: normal  ST/T Wave abnormalities: normal  Conduction Disutrbances:none  Narrative Interpretation: normal EKG  Old EKG Reviewed: unchanged   Imaging Review Dg Chest 2 View  03/26/2013   *RADIOLOGY REPORT*  Clinical Data: Chest pain and shortness of breath.  CHEST - 2 VIEW  Comparison: Chest radiograph performed 01/15/2013  Findings: The lungs are mildly hyperexpanded, with flattening of the hemidiaphragms, suspicious for COPD.  There is no evidence of focal opacification, pleural effusion or pneumothorax.  The heart is normal in size; the mediastinal contour is within normal limits.  No acute osseous abnormalities are seen.  IMPRESSION: Findings suggest COPD; no acute cardiopulmonary process seen.   Original Report Authenticated By: Tonia Ghent, M.D.    MDM   1. COPD (chronic obstructive pulmonary disease)   2. Chest wall pain   3. Bronchospasm     Patient with chest pain/tightness, generalized abdominal pain s/p drinking beer and eating dinner. He is well appearing in NAD with normal vital signs. O2 sat 98% on RA. Chest pain reproducible on exam. Scattered expiratory wheezes on lung exam, albuterol neb given with great relief of his chest tightness. Clinical improvement of wheezing noted after neb. Also gave toradol, pain currently 5/10 from 8/10. EKG unremarkable. Labs obtained- troponin, cbc, cmp and are normal. CXR  clear. I do not feel patient's symptoms are cardiac related, doubt PE. Symptoms from COPD exacerbation/heartburn. He is stable for discharge home. Will discharge with inhaler. NSAIDs for chest wall pain. Resources for f/u with wellness center given. Return precautions discussed. Patient states understanding of treatment care plan and is agreeable.   Trevor Mace, PA-C 03/27/13 0009

## 2013-03-26 NOTE — ED Notes (Signed)
Patient transported to XR. 

## 2013-03-26 NOTE — Progress Notes (Signed)
RT in room for tx.  Pt. In X-ray.

## 2013-03-26 NOTE — ED Notes (Signed)
Patient remains in hallway at this time. EKG completed by Tech, and shown to MD. Pending transition to A05 after room is clean

## 2013-03-26 NOTE — ED Notes (Signed)
Patient drank beer this evening and began having chest/epigastric pain afterward. Received 3 nitro tabs and 324mg  of aspirin without relief via EMS.

## 2013-03-27 MED ORDER — AEROCHAMBER PLUS W/MASK MISC
Status: AC
  Filled 2013-03-27: qty 1

## 2013-03-27 MED ORDER — ALBUTEROL SULFATE HFA 108 (90 BASE) MCG/ACT IN AERS
2.0000 | INHALATION_SPRAY | Freq: Once | RESPIRATORY_TRACT | Status: AC
  Administered 2013-03-27: 2 via RESPIRATORY_TRACT
  Filled 2013-03-27: qty 6.7

## 2013-03-27 NOTE — ED Provider Notes (Signed)
Medical screening examination/treatment/procedure(s) were performed by non-physician practitioner and as supervising physician I was immediately available for consultation/collaboration.   Braheem Tomasik, MD 03/27/13 0452 

## 2013-04-05 ENCOUNTER — Emergency Department (HOSPITAL_COMMUNITY)
Admission: EM | Admit: 2013-04-05 | Discharge: 2013-04-05 | Disposition: A | Discharge status: Home / Self Care | Payer: Self-pay | Attending: Emergency Medicine | Admitting: Emergency Medicine

## 2013-04-05 ENCOUNTER — Encounter (HOSPITAL_COMMUNITY): Payer: Self-pay | Admitting: Emergency Medicine

## 2013-04-05 ENCOUNTER — Emergency Department (HOSPITAL_COMMUNITY): Payer: Self-pay

## 2013-04-05 DIAGNOSIS — I252 Old myocardial infarction: Secondary | ICD-10-CM | POA: Insufficient documentation

## 2013-04-05 DIAGNOSIS — R0602 Shortness of breath: Secondary | ICD-10-CM | POA: Insufficient documentation

## 2013-04-05 DIAGNOSIS — R071 Chest pain on breathing: Secondary | ICD-10-CM | POA: Insufficient documentation

## 2013-04-05 DIAGNOSIS — Z880 Allergy status to penicillin: Secondary | ICD-10-CM | POA: Insufficient documentation

## 2013-04-05 DIAGNOSIS — R05 Cough: Secondary | ICD-10-CM | POA: Insufficient documentation

## 2013-04-05 DIAGNOSIS — R6883 Chills (without fever): Secondary | ICD-10-CM | POA: Insufficient documentation

## 2013-04-05 DIAGNOSIS — R51 Headache: Secondary | ICD-10-CM | POA: Insufficient documentation

## 2013-04-05 DIAGNOSIS — R062 Wheezing: Secondary | ICD-10-CM | POA: Insufficient documentation

## 2013-04-05 DIAGNOSIS — R0789 Other chest pain: Secondary | ICD-10-CM

## 2013-04-05 DIAGNOSIS — R059 Cough, unspecified: Secondary | ICD-10-CM | POA: Insufficient documentation

## 2013-04-05 DIAGNOSIS — F17200 Nicotine dependence, unspecified, uncomplicated: Secondary | ICD-10-CM | POA: Insufficient documentation

## 2013-04-05 DIAGNOSIS — J3489 Other specified disorders of nose and nasal sinuses: Secondary | ICD-10-CM | POA: Insufficient documentation

## 2013-04-05 DIAGNOSIS — H5500 Unspecified nystagmus: Secondary | ICD-10-CM | POA: Insufficient documentation

## 2013-04-05 LAB — CBC WITH DIFFERENTIAL/PLATELET
Basophils Absolute: 0 10*3/uL (ref 0.0–0.1)
Basophils Relative: 1 % (ref 0–1)
Eosinophils Relative: 0 % (ref 0–5)
HCT: 43.7 % (ref 39.0–52.0)
MCHC: 36.2 g/dL — ABNORMAL HIGH (ref 30.0–36.0)
MCV: 94 fL (ref 78.0–100.0)
Monocytes Absolute: 0.7 10*3/uL (ref 0.1–1.0)
RDW: 14.4 % (ref 11.5–15.5)

## 2013-04-05 LAB — BASIC METABOLIC PANEL
CO2: 21 mEq/L (ref 19–32)
Calcium: 8.8 mg/dL (ref 8.4–10.5)
Creatinine, Ser: 0.94 mg/dL (ref 0.50–1.35)
GFR calc Af Amer: >90 mL/min (ref >90)

## 2013-04-05 LAB — POCT I-STAT TROPONIN I: Troponin i, poc: 0.01 ng/mL (ref 0.00–0.08)

## 2013-04-05 MED ORDER — KETOROLAC TROMETHAMINE 30 MG/ML IJ SOLN
30.0000 mg | Freq: Once | INTRAMUSCULAR | Status: AC
  Administered 2013-04-05: 30 mg via INTRAVENOUS
  Filled 2013-04-05: qty 1

## 2013-04-05 MED ORDER — PROMETHAZINE HCL 25 MG/ML IJ SOLN
25.0000 mg | Freq: Once | INTRAMUSCULAR | Status: AC
  Administered 2013-04-05: 25 mg via INTRAVENOUS
  Filled 2013-04-05: qty 1

## 2013-04-05 MED ORDER — METOCLOPRAMIDE HCL 5 MG/ML IJ SOLN
10.0000 mg | Freq: Once | INTRAMUSCULAR | Status: AC
  Administered 2013-04-05: 10 mg via INTRAVENOUS
  Filled 2013-04-05: qty 2

## 2013-04-05 MED ORDER — NAPROXEN 500 MG PO TABS: 500.0000 mg | ORAL_TABLET | Freq: Two times a day (BID) | ORAL | Status: DC | PRN

## 2013-04-05 MED ORDER — DIAZEPAM 5 MG PO TABS
5.0000 mg | ORAL_TABLET | Freq: Once | ORAL | Status: AC
  Administered 2013-04-05: 5 mg via ORAL
  Filled 2013-04-05: qty 1

## 2013-04-05 NOTE — ED Notes (Signed)
Bed: ZO10 Expected date:  Expected time:  Means of arrival:  Comments: EMS-SOB/asthma

## 2013-04-05 NOTE — ED Notes (Signed)
Pt complaining of right arm spasms, NP notified, see MAR

## 2013-04-05 NOTE — ED Notes (Signed)
Headache, cough, sob, hx of asthma and took inhalor at home, cbg 121,

## 2013-04-05 NOTE — ED Provider Notes (Signed)
CSN: [id number]     Arrival date & time 04/05/13  1643 History   First MD Initiated Contact with Patient 04/05/13 1707     Chief Complaint  Patient presents with  . Cough  . Headache   (Consider location/radiation/quality/duration/timing/severity/associated sxs/prior Treatment) Patient is a 52 y.o. male presenting with cough, headaches, and chest pain. The history is provided by the patient and medical records. No language interpreter was used.  Cough Cough characteristics:  Paroxysmal Severity:  Moderate Onset quality:  Sudden Duration:  1 day Timing:  Intermittent Progression:  Waxing and waning Chronicity:  Recurrent Smoker: yes   Associated symptoms: chest pain, chills, headaches, shortness of breath and sinus congestion   Headaches:    Severity:  Severe   Onset quality:  Sudden   Duration:  1 day   Timing:  Constant   Progression:  Worsening   Chronicity:  New Headache Associated symptoms: cough   Chest Pain Pain location:  Substernal area Pain quality: pressure   Pain radiates to:  Does not radiate Pain radiates to the back: no   Pain severity:  Moderate Onset quality:  Gradual Timing:  Intermittent Progression:  Waxing and waning Chronicity:  Chronic Ineffective treatments:  None tried Associated symptoms: cough, headache and shortness of breath   Risk factors: smoking     Past Medical History  Diagnosis Date  . MI, old    Past Surgical History  Procedure Laterality Date  . Hernia repair     Family History  Problem Relation Age of Onset  . Diabetes Mellitus II Mother   . Diabetes Mellitus II Father    History  Substance Use Topics  . Smoking status: Current Every Day Smoker    Types: Cigarettes  . Smokeless tobacco: Not on file  . Alcohol Use: Yes     Comment: occ    Review of Systems  Constitutional: Positive for chills.  Respiratory: Positive for cough and shortness of breath.   Cardiovascular: Positive for chest pain.  Neurological:  Positive for headaches.  All other systems reviewed and are negative.    Allergies  Penicillins  Home Medications   Current Outpatient Rx  Name  Route  Sig  Dispense  Refill  . aspirin 325 MG tablet   Oral   Take 325 mg by mouth every 4 (four) hours as needed for pain.          BP 129/82  Pulse 95  Temp(Src) 99.6 F (37.6 C) (Oral)  Resp 22  SpO2 96% Physical Exam  Nursing note and vitals reviewed. Constitutional: He is oriented to person, place, and time. He appears well-developed and well-nourished.  HENT:  Head: Normocephalic.  Eyes: Pupils are equal, round, and reactive to light. Right eye exhibits nystagmus. Left eye exhibits nystagmus.  Neck: Normal range of motion.  Cardiovascular: Normal rate and regular rhythm.   Pulmonary/Chest: Effort normal. He has wheezes.  Abdominal: Soft. Bowel sounds are normal.  Musculoskeletal: He exhibits no edema and no tenderness.  Lymphadenopathy:    He has no cervical adenopathy.  Neurological: He is alert and oriented to person, place, and time. No cranial nerve deficit.  Skin: Skin is warm and dry.  Psychiatric: He has a normal mood and affect. His behavior is normal. Judgment and thought content normal.    ED Course  Procedures (including critical care time) Labs Review Labs Reviewed  CBC WITH DIFFERENTIAL  BASIC METABOLIC PANEL   Imaging Review No results found.  EKG Interpretation  None      Date: 04/05/2013  Rate:93  Rhythm: normal sinus rhythm  QRS Axis: normal  Intervals: normal  ST/T Wave abnormalities: normal  Conduction Disutrbances:none  Narrative Interpretation: NSR  Old EKG Reviewed: unchanged    Radiology and laboratory results reviewed and shared with patient.  Normal head CT.   No acute findings on chest film.  Headache improved after medication.  No ischemia on EKG, troponin negative.  Discussed patient with Dr. Estell Harpin.    MDM  Headache. Chest wall pain.      Jimmye Norman,  NP 04/05/13 2204

## 2013-04-05 NOTE — ED Provider Notes (Signed)
Medical screening examination/treatment/procedure(s) were performed by non-physician practitioner and as supervising physician I was immediately available for consultation/collaboration.   Gionni Vaca L Ariez Neilan, MD 04/05/13 2323 

## 2013-05-26 ENCOUNTER — Emergency Department (HOSPITAL_COMMUNITY): Payer: Self-pay

## 2013-05-26 ENCOUNTER — Emergency Department (HOSPITAL_COMMUNITY)
Admission: EM | Admit: 2013-05-26 | Discharge: 2013-05-26 | Disposition: A | Discharge status: Home / Self Care | Payer: Self-pay | Attending: Emergency Medicine | Admitting: Emergency Medicine

## 2013-05-26 ENCOUNTER — Encounter (HOSPITAL_COMMUNITY): Payer: Self-pay | Admitting: Emergency Medicine

## 2013-05-26 DIAGNOSIS — F1010 Alcohol abuse, uncomplicated: Secondary | ICD-10-CM | POA: Insufficient documentation

## 2013-05-26 DIAGNOSIS — I252 Old myocardial infarction: Secondary | ICD-10-CM | POA: Insufficient documentation

## 2013-05-26 DIAGNOSIS — I2510 Atherosclerotic heart disease of native coronary artery without angina pectoris: Secondary | ICD-10-CM | POA: Insufficient documentation

## 2013-05-26 DIAGNOSIS — F17200 Nicotine dependence, unspecified, uncomplicated: Secondary | ICD-10-CM | POA: Insufficient documentation

## 2013-05-26 DIAGNOSIS — J189 Pneumonia, unspecified organism: Secondary | ICD-10-CM

## 2013-05-26 DIAGNOSIS — J159 Unspecified bacterial pneumonia: Secondary | ICD-10-CM | POA: Insufficient documentation

## 2013-05-26 DIAGNOSIS — Z880 Allergy status to penicillin: Secondary | ICD-10-CM | POA: Insufficient documentation

## 2013-05-26 DIAGNOSIS — R109 Unspecified abdominal pain: Secondary | ICD-10-CM | POA: Insufficient documentation

## 2013-05-26 DIAGNOSIS — R079 Chest pain, unspecified: Secondary | ICD-10-CM | POA: Insufficient documentation

## 2013-05-26 HISTORY — DX: Atherosclerotic heart disease of native coronary artery without angina pectoris: I25.10

## 2013-05-26 LAB — URINALYSIS, ROUTINE W REFLEX MICROSCOPIC
Glucose, UA: NEGATIVE mg/dL
Leukocytes, UA: NEGATIVE
Protein, ur: NEGATIVE mg/dL
Specific Gravity, Urine: 1.01 (ref 1.005–1.030)
Urobilinogen, UA: 0.2 mg/dL (ref 0.0–1.0)
pH: 5.5 (ref 5.0–8.0)

## 2013-05-26 LAB — CBC WITH DIFFERENTIAL/PLATELET
Eosinophils Relative: 2 % (ref 0–5)
HCT: 42.7 % (ref 39.0–52.0)
Hemoglobin: 15.5 g/dL (ref 13.0–17.0)
Lymphocytes Relative: 60 % — ABNORMAL HIGH (ref 12–46)
Lymphs Abs: 2.5 10*3/uL (ref 0.7–4.0)
MCV: 96.4 fL (ref 78.0–100.0)
Monocytes Absolute: 0.4 10*3/uL (ref 0.1–1.0)
Monocytes Relative: 11 % (ref 3–12)
Neutro Abs: 1.1 10*3/uL — ABNORMAL LOW (ref 1.7–7.7)
Platelets: 204 10*3/uL (ref 150–400)
WBC: 4.1 10*3/uL (ref 4.0–10.5)

## 2013-05-26 LAB — POCT I-STAT, CHEM 8
BUN: 12 mg/dL (ref 6–23)
Chloride: 107 mEq/L (ref 96–112)
Creatinine, Ser: 1.2 mg/dL (ref 0.50–1.35)
Glucose, Bld: 114 mg/dL — ABNORMAL HIGH (ref 70–99)
HCT: 48 % (ref 39.0–52.0)
Potassium: 4.3 mEq/L (ref 3.5–5.1)

## 2013-05-26 LAB — POCT I-STAT TROPONIN I

## 2013-05-26 MED ORDER — AZITHROMYCIN 250 MG PO TABS: 250.0000 mg | ORAL_TABLET | Freq: Every day | ORAL | Status: DC

## 2013-05-26 MED ORDER — ONDANSETRON HCL 4 MG/2ML IJ SOLN
4.0000 mg | Freq: Once | INTRAMUSCULAR | Status: AC
  Administered 2013-05-26: 4 mg via INTRAVENOUS
  Filled 2013-05-26: qty 2

## 2013-05-26 MED ORDER — ALBUTEROL SULFATE (5 MG/ML) 0.5% IN NEBU
5.0000 mg | INHALATION_SOLUTION | Freq: Once | RESPIRATORY_TRACT | Status: AC
  Administered 2013-05-26: 5 mg via RESPIRATORY_TRACT
  Filled 2013-05-26: qty 1

## 2013-05-26 NOTE — ED Notes (Signed)
Pt able to provide urine sample with out the use of the in and out catheter.

## 2013-05-26 NOTE — ED Notes (Signed)
Pt given a urinal and aware a sample is needed. Unable to provide a sample at this time.

## 2013-05-26 NOTE — ED Notes (Signed)
Patient with sudden onset of chest pain radiating to his abdomen.  Patient stated he was having some shortness of breath with the pain.  Pain was rated an 8/10.  Patient was given 324mg  ASA and one SL nitro en route, which decreased his pain level to 7/10.  Patient is CAOx3 upon arrival to ED.

## 2013-05-26 NOTE — ED Notes (Signed)
Pt back from xray, calm, resting reclined with eyes closed, scant late expiritory wheezing remains, alert, NAD, interactive, denies nausea, "breathing better", pain remains the same, 7/10. No s/sx of pain except verbal complaint.

## 2013-05-26 NOTE — ED Provider Notes (Signed)
CSN: [id number]     Arrival date & time 05/26/13  0126 History   First MD Initiated Contact with Patient 05/26/13 0224     Chief Complaint  Patient presents with  . Chest Pain    Patient is a 52 y.o. male presenting with chest pain. The history is provided by the patient.  Chest Pain Pain location:  L chest Pain quality: aching   Radiates to: abdomen/groin. Pain radiates to the back: no   Pain severity:  Moderate Onset quality:  Sudden Duration: just prior to arrival. Timing:  Constant Progression:  Improving Ineffective treatments:  Aspirin and nitroglycerin Associated symptoms: abdominal pain, cough and shortness of breath   Associated symptoms: no fever, no syncope, not vomiting and no weakness    Pt reports he was leaving "the club" when he noted chest pain that radiated into abdomen and groin He reports drinking "just two beers" No drug use reported He reports SOB/cough No fever/vomiting No new weakness No syncope reported  Past Medical History  Diagnosis Date  . MI, old   . Coronary artery disease    Past Surgical History  Procedure Laterality Date  . Hernia repair     Family History  Problem Relation Age of Onset  . Diabetes Mellitus II Mother   . Diabetes Mellitus II Father    History  Substance Use Topics  . Smoking status: Current Every Day Smoker    Types: Cigarettes  . Smokeless tobacco: Not on file  . Alcohol Use: Yes     Comment: occ    Review of Systems  Constitutional: Negative for fever.  Respiratory: Positive for cough and shortness of breath.   Cardiovascular: Positive for chest pain. Negative for syncope.  Gastrointestinal: Positive for abdominal pain. Negative for vomiting.  Genitourinary: Negative for dysuria.  Neurological: Negative for syncope and weakness.  All other systems reviewed and are negative.    Allergies  Penicillins  Home Medications   Current Outpatient Rx  Name  Route  Sig  Dispense  Refill  . aspirin 325 MG  tablet   Oral   Take 325 mg by mouth every 4 (four) hours as needed for pain.          BP 104/68  Pulse 83  Temp(Src) 98 F (36.7 C) (Oral)  Resp 25  Ht 6\' 1"  (1.854 m)  SpO2 96% Physical Exam CONSTITUTIONAL: Well developed/well nourished, pt sleeping on my arrival to room HEAD: Normocephalic/atraumatic EYES: EOMI/PERRL ENMT: Mucous membranes moist NECK: supple no meningeal signs SPINE:entire spine nontender CV: S1/S2 noted, no murmurs/rubs/gallops noted Chest - tender to palpation LUNGS:  no apparent distress, no crackles noted ABDOMEN: soft, nontender, no rebound or guarding GU:no cva tenderness, no inguinal hernia, no testicular tenderness noted NEURO: Pt is awake/alert, moves all extremitiesx4 EXTREMITIES: pulses normal, full ROM SKIN: warm, color normal PSYCH: no abnormalities of mood noted  ED Course  Procedures (including critical care time) Labs Review Labs Reviewed  CBC WITH DIFFERENTIAL - Abnormal; Notable for the following:    MCH 35.0 (*)    MCHC 36.3 (*)    Neutrophils Relative % 27 (*)    Neutro Abs 1.1 (*)    Lymphocytes Relative 60 (*)    All other components within normal limits  POCT I-STAT, CHEM 8 - Abnormal; Notable for the following:    Glucose, Bld 114 (*)    All other components within normal limits  PRO B NATRIURETIC PEPTIDE  URINALYSIS, ROUTINE W REFLEX MICROSCOPIC  POCT  I-STAT TROPONIN I   Imaging Review Dg Chest 2 View  05/26/2013   CLINICAL DATA:  Left-sided chest pain and shortness of breath. Right groin pain.  EXAM: CHEST  2 VIEW  COMPARISON:  Chest radiograph performed 04/05/2013  FINDINGS: The lungs are well-aerated. Mild left basilar airspace opacity may reflect atelectasis or possibly mild pneumonia. No pleural effusion or pneumothorax is seen.  The heart is normal in size; the mediastinal contour is within normal limits. No acute osseous abnormalities are seen.  IMPRESSION: Mild left basilar airspace opacity may reflect atelectasis  or possibly mild pneumonia.   Electronically Signed   By: Roanna Raider M.D.   On: 05/26/2013 02:38    EKG Interpretation    Date/Time:  Saturday May 26 2013 01:33:32 EST Ventricular Rate:  96 PR Interval:  151 QRS Duration: 90 QT Interval:  361 QTC Calculation: 456 R Axis:   85 Text Interpretation:  Sinus rhythm Non-specific ST-t changes No significant change since last tracing Confirmed by Bebe Shaggy  MD, Estee Yohe [phone]) on 05/26/2013 1:44:44 AM            MDM  No diagnosis found. Nursing notes including past medical history and social history reviewed and considered in documentation xrays reviewed and considered Labs/vital reviewed and considered Previous records reviewed and considered - h/o negative cardiac cath  3:40 AM Pt with negative cardiac cath in Jan. 2014 CP reproducible, doubt ACS/Pe/Dissection He is in no distress, sleeping currently Reports groin pain though exam unremarkable Will check u/a ?abnormal CXR, and reported cough will treat with antibiotics He had been given nebs prior to my evaluation and now lungs sounds have improved  4:37 AM Pt sleeping No distress noted Will treat for CAP Stable for d/c home    Joya Gaskins, MD 05/26/13 [phone]

## 2013-05-26 NOTE — ED Notes (Signed)
Neb in progress, registration at [facility], pt resting with eyes closed breathing in neb/albuterol. Alert, NAD, calm, interactive, resps e/u speaking in clear complete sentences, c/o CP, sob, nausea, expiritory wheeze noted, also pedal edema noted +1. (denies: fever, vd). Reports productive cough white phlegm chest congestion.

## 2013-05-26 NOTE — ED Notes (Signed)
Encouraged to void, catheter explained, pt agreeable, attempting to use urinal. Pt sleepy/lethargic, arousable to voice.

## 2013-06-26 ENCOUNTER — Emergency Department (HOSPITAL_COMMUNITY)
Admission: EM | Admit: 2013-06-26 | Discharge: 2013-06-26 | Disposition: A | Discharge status: Home / Self Care | Payer: Self-pay | Attending: Emergency Medicine | Admitting: Emergency Medicine

## 2013-06-26 ENCOUNTER — Emergency Department (HOSPITAL_COMMUNITY): Payer: Self-pay

## 2013-06-26 ENCOUNTER — Encounter (HOSPITAL_COMMUNITY): Payer: Self-pay | Admitting: Emergency Medicine

## 2013-06-26 DIAGNOSIS — I2510 Atherosclerotic heart disease of native coronary artery without angina pectoris: Secondary | ICD-10-CM | POA: Insufficient documentation

## 2013-06-26 DIAGNOSIS — I252 Old myocardial infarction: Secondary | ICD-10-CM | POA: Insufficient documentation

## 2013-06-26 DIAGNOSIS — Z880 Allergy status to penicillin: Secondary | ICD-10-CM | POA: Insufficient documentation

## 2013-06-26 DIAGNOSIS — F17200 Nicotine dependence, unspecified, uncomplicated: Secondary | ICD-10-CM | POA: Insufficient documentation

## 2013-06-26 DIAGNOSIS — R569 Unspecified convulsions: Secondary | ICD-10-CM | POA: Insufficient documentation

## 2013-06-26 LAB — CBC
HCT: 42.7 % (ref 39.0–52.0)
HEMOGLOBIN: 15.4 g/dL (ref 13.0–17.0)
MCH: 34.8 pg — ABNORMAL HIGH (ref 26.0–34.0)
MCHC: 36.1 g/dL — AB (ref 30.0–36.0)
MCV: 96.4 fL (ref 78.0–100.0)
Platelets: 164 10*3/uL (ref 150–400)
RBC: 4.43 MIL/uL (ref 4.22–5.81)
RDW: 14.6 % (ref 11.5–15.5)
WBC: 4.1 10*3/uL (ref 4.0–10.5)

## 2013-06-26 LAB — BASIC METABOLIC PANEL
BUN: 20 mg/dL (ref 6–23)
CHLORIDE: 101 meq/L (ref 96–112)
CO2: 21 mEq/L (ref 19–32)
CREATININE: 1.04 mg/dL (ref 0.50–1.35)
Calcium: 9.1 mg/dL (ref 8.4–10.5)
GFR calc non Af Amer: 81 mL/min — ABNORMAL LOW (ref >90)
GLUCOSE: 90 mg/dL (ref 70–99)
Potassium: 4.6 mEq/L (ref 3.7–5.3)
Sodium: 137 mEq/L (ref 137–147)

## 2013-06-26 LAB — GLUCOSE, CAPILLARY: Glucose-Capillary: 94 mg/dL (ref 70–99)

## 2013-06-26 NOTE — ED Provider Notes (Signed)
CSN: [id number]     Arrival date & time 06/26/13  1821 History   First MD Initiated Contact with Patient 06/26/13 2137     Chief Complaint  Patient presents with  . 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 The patient states he has had multiple "seizures" over the last few months. He states he has had approximately 12 episodes. He states that he last had a seizure earlier today. He states that some have been witnessed. He states that earlier today he had one that lasted for 5 minutes. He reports full LOC. He states that occasionally he will have bladder incontinence but denies any history of stool incontinence. He states the episode was witnessed today by family members who told him he had generalized shaking for 5 minutes. He states that when he wakes up he usually feels groggy. He states he decided to be evaluated today because he believes they are more frequent and that he scared his family members when he had the seizure.   Past Medical History  Diagnosis Date  . MI, old   . Coronary artery disease    Past Surgical History  Procedure Laterality Date  . Hernia repair     Family History  Problem Relation Age of Onset  . Diabetes Mellitus II Mother   . Diabetes Mellitus II Father    History  Substance Use Topics  . Smoking status: Current Every Day Smoker    Types: Cigarettes  . Smokeless tobacco: Not on file  . Alcohol Use: Yes     Comment: occ    Review of Systems  Constitutional: Negative for fever and chills.  Respiratory: Negative for cough.   Cardiovascular: Negative for chest pain.  Gastrointestinal: Negative for nausea, vomiting and abdominal pain.  Genitourinary: Negative for dysuria and frequency.  Neurological: Positive for seizures. Negative for weakness, numbness and headaches.  All other systems reviewed and are negative.    Allergies  Penicillins  Home Medications   Current Outpatient Rx   Name  Route  Sig  Dispense  Refill  . aspirin 325 MG tablet   Oral   Take 325 mg by mouth every 4 (four) hours as needed for pain.          BP 119/82  Pulse 77  Temp(Src) 98 F (36.7 C) (Oral)  Resp 20  SpO2 97% Physical Exam  Constitutional: He is oriented to person, place, and time. He appears well-developed and well-nourished. No distress.  HENT:  Head: Normocephalic and atraumatic.  Mouth/Throat: No oropharyngeal exudate.  Eyes: Conjunctivae are normal. Pupils are equal, round, and reactive to light.  Fundoscopic exam:      The right eye shows no papilledema.       The left eye shows no papilledema.  Neck: Normal range of motion. Neck supple.  Cardiovascular: Normal rate and normal heart sounds.  Exam reveals no gallop and no friction rub.   No murmur heard. Pulmonary/Chest: Effort normal and breath sounds normal.  Abdominal: Soft. He exhibits no distension. There is no tenderness.  Musculoskeletal: Normal range of motion. He exhibits no edema and no tenderness.  Neurological: He is alert and oriented to person, place, and time. He has normal strength and normal reflexes. No cranial nerve deficit or sensory deficit. Coordination normal. GCS eye subscore is 4. GCS verbal subscore is 5. GCS motor subscore is 6.  Skin: Skin is warm and dry.  ED Course  Procedures (including critical care time) Labs Review Labs Reviewed  BASIC METABOLIC PANEL - Abnormal; Notable for the following:    GFR calc non Af Amer 81 (*)    All other components within normal limits  CBC - Abnormal; Notable for the following:    MCH 34.8 (*)    MCHC 36.1 (*)    All other components within normal limits  GLUCOSE, CAPILLARY   Imaging Review Ct Head Wo Contrast  06/26/2013   CLINICAL DATA:  Seizures  EXAM: CT HEAD WITHOUT CONTRAST  TECHNIQUE: Contiguous axial images were obtained from the base of the skull through the vertex without intravenous contrast. Study was obtained within 24 hr of  patient's arrival at the emergency department.  COMPARISON:  April 05, 2013  FINDINGS: The ventricles are normal in size and configuration. There is no mass, hemorrhage, extra-axial fluid collection, or midline shift. The gray-white compartments are normal. There is no demonstrable acute infarct.  Bony calvarium appears intact. The mastoid air cells are clear. There is mucosal thickening in the visualized right maxillary antrum.  IMPRESSION: Right maxillary sinus disease.  Study otherwise unremarkable.   Electronically Signed   By: Bretta Bang M.D.   On: 06/26/2013 22:30    EKG Interpretation   None      MDM   1. Seizures     Here with seizure like episodes. The way he describes the episodes sounds like they may very well be seizures he is having. He denies any recent trauma. He is currently asymptomatic. He has a non-focal neuro exam and normal vitals. Labs unremarkable. CT head negative. Will refer to outpatient neurology. Seizure precautions given and discussed with the patient who was in agreement with the plan.     Shanon Ace, MD 06/27/13 [phone]

## 2013-06-26 NOTE — Discharge Instructions (Signed)
Seizure, Adult °A seizure is abnormal electrical activity in the brain. Seizures can cause a change in attention or behavior (altered mental status). Seizures often involve uncontrollable shaking (convulsions). Seizures usually last from 30 seconds to 2 minutes. Epilepsy is a brain disorder in which a patient has repeated seizures over time. °CAUSES  °There are many different problems that can cause seizures. In some cases, no cause is found. Common causes of seizures include: °· Head injuries. °· Brain tumors. °· Infections. °· Imbalance of chemicals in the blood. °· Kidney failure or liver failure. °· Heart disease. °· Drug abuse. °· Stroke. °· Withdrawal from certain drugs or alcohol. °· Birth defects. °· Malfunction of a neurosurgical device placed in the brain. °SYMPTOMS  °Symptoms vary depending on the part of the brain that is involved. Right before a seizure, you may have a warning (aura) that a seizure is about to occur. An aura may include the following symptoms:  °· Fear or anxiety. °· Nausea. °· Feeling like the room is spinning (vertigo). °· Vision changes, such as seeing flashing lights or spots. °Common symptoms during a seizure include: °· Convulsions. °· Drooling. °· Rapid eye movements. °· Grunting. °· Loss of bladder and bowel control. °· Bitter taste in the mouth. °After a seizure, you may feel confused and sleepy. You may also have an injury resulting from convulsions during the seizure. °DIAGNOSIS  °Your caregiver will perform a physical exam and run some tests to determine the type and cause of your seizure. These tests may include: °· Blood tests. °· A lumbar puncture test. In this test, a small amount of fluid is removed from the spine and examined. °· Electrocardiography (ECG). This test records the electrical activity in your heart. °· Imaging tests, such as computed tomography (CT) scans or magnetic resonance imaging (MRI). °· Electroencephalography (EEG). This test records the electrical  activity in your brain. °TREATMENT  °Seizures usually stop on their own. Treatment will depend on the cause of your seizure. In some cases, medicine may be given to prevent future seizures. °HOME CARE INSTRUCTIONS  °· If you are given medicines, take them exactly as prescribed by your caregiver. °· Keep all follow-up appointments as directed by your caregiver. °· Do not swim or drive until your caregiver says it is okay. °· Teach friends and family what to do if you have a seizure. They should: °· Lay you on the ground to prevent a fall. °· Put a cushion under your head. °· Loosen any tight clothing around your neck. °· Turn you on your side. If vomiting occurs, this helps keep your airway clear. °· Stay with you until you recover. °SEEK IMMEDIATE MEDICAL CARE IF: °· The seizure lasts longer than 2 to 5 minutes. °· The seizure is severe or the person does not wake up after the seizure. °· The person has altered mental status. °Drive the person to the emergency department or call your local emergency services (911 in [place] °MAKE SURE YOU: °· Understand these instructions. °· Will watch your condition. °· Will get help right away if you are not doing well or get worse. °Document Released: 06/04/2000 Document Revised: 08/30/2011 Document Reviewed: 05/26/2011 °ExitCare® Patient Information ©2014 [employer]. ° °Driving and Equipment Restrictions °Some medical problems make it dangerous to drive, ride a bike, or use machines. Some of these problems are: °· A hard blow to the head (concussion). °· Passing out (fainting). °· Twitching and shaking (seizures). °· Low blood sugar. °· Taking medicine to   help you relax (sedatives). °· Taking pain medicines. °· Wearing an eye patch. °· Wearing splints. This can make it hard to use parts of your body that you need to drive safely. °HOME CARE  °· Do not drive until your doctor says it is okay. °· Do not use machines until your doctor says it is okay. °You may need a form signed  by your doctor (medical release) before you can drive again. You may also need this form before you do other tasks where you need to be fully alert. °MAKE SURE YOU: °· Understand these instructions. °· Will watch your condition. °· Will get help right away if you are not doing well or get worse. °Document Released: 07/15/2004 Document Revised: 08/30/2011 Document Reviewed: 10/15/2009 °Exit[employer]® Patient Information ©2014 [employer]. ° °

## 2013-06-26 NOTE — ED Notes (Signed)
Pt started having seizures that started 3 months ago.  Pt states that he completely blacked out and does not remember until it is over.  Pt has not seen an MD for these "seizures".  Pt states he had 2 episodes today, denies incontinence.  Pt states that he did not hit head.  No tongue trauma.  Pt states that he is told his body shakes and he just goes out

## 2013-06-27 NOTE — ED Provider Notes (Signed)
I have personally seen and examined the patient.  I have discussed the plan of care with the resident.  I have reviewed the documentation on PMH/FH/Soc. History.  I have reviewed the documentation of the resident and agree.  Date: 06/26/2013  Rate: 89  Rhythm: sinus  QRS Axis: normal  Intervals: normal  ST/T Wave abnormalities: normal   Conduction Disutrbances:none    I discussed need for neuro followup and driving restrictions were discussed.  Pt agreeable with plan for d/c home    EKG Interpretation   None        Joya Gaskinsonald W Diontay Rosencrans, MD 06/27/13 [phone]

## 2013-11-11 IMAGING — CT CT ABD-PELV W/ CM
2 of 5 series · 16 of 46 positions shown, 18 images · IV contrast (APPLIED)
Comparison: 09/17/2011

CLINICAL DATA: Right lower quadrant pain.

CT ABDOMEN AND PELVIS WITH CONTRAST
TECHNIQUE: Multidetector CT imaging of the abdomen and pelvis was
performed following the standard protocol during bolus
administration of intravenous contrast.
Contrast: 80mL OMNIPAQUE IOHEXOL 300 MG/ML  SOLN

[Series 2: abd/pelv with 5.0 b31f st · axial · 0.70mm/px · z∈[-510,-120]mm · 13 of 88 slices shown, 15 images]
[im 5/88  soft-tissue]
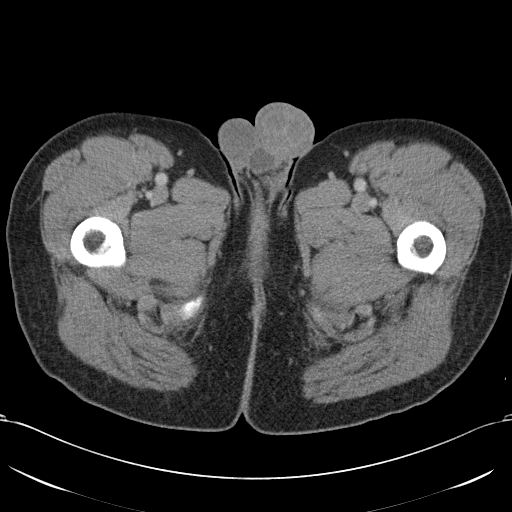
[im 5/88  bone]
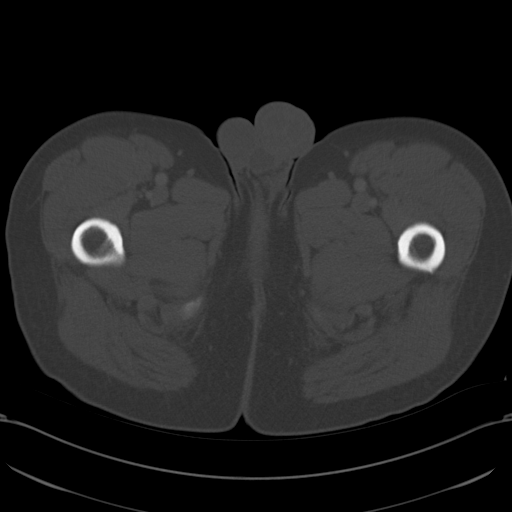
[im 14/88  soft-tissue]
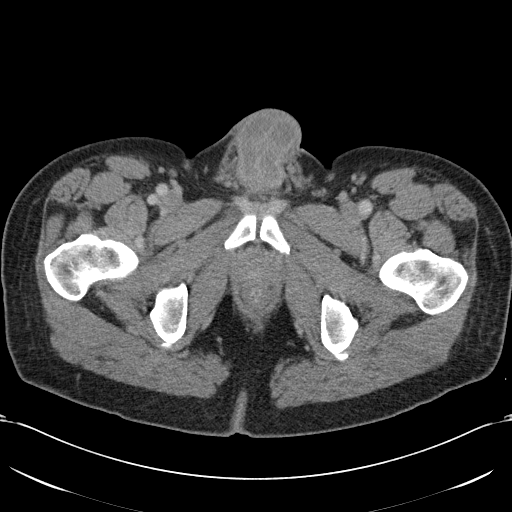
[im 18/88  soft-tissue]
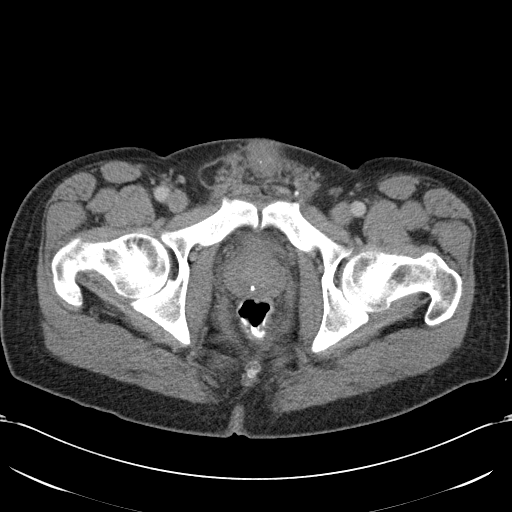
[im 27/88  soft-tissue]
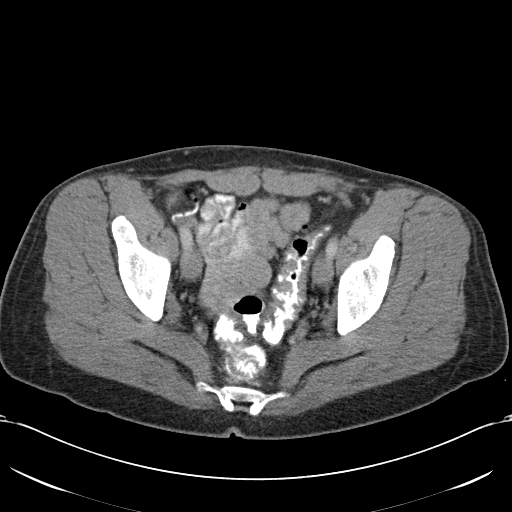
[im 31/88  soft-tissue]
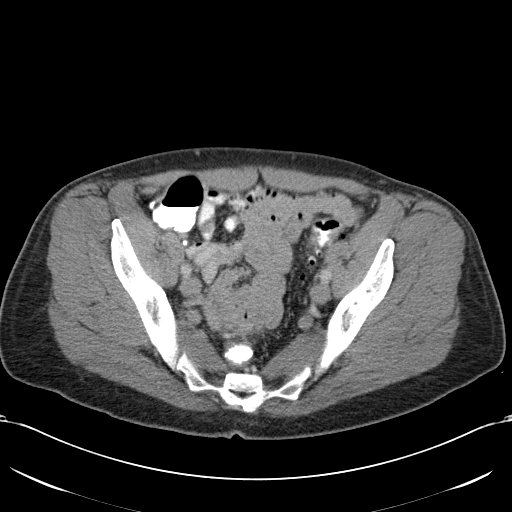
[im 40/88  soft-tissue]
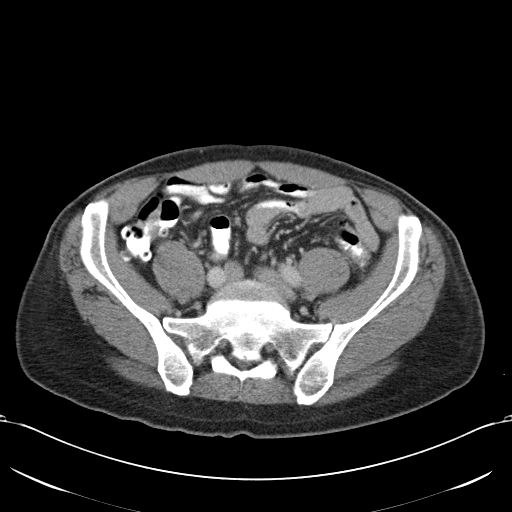
[im 44/88  soft-tissue]
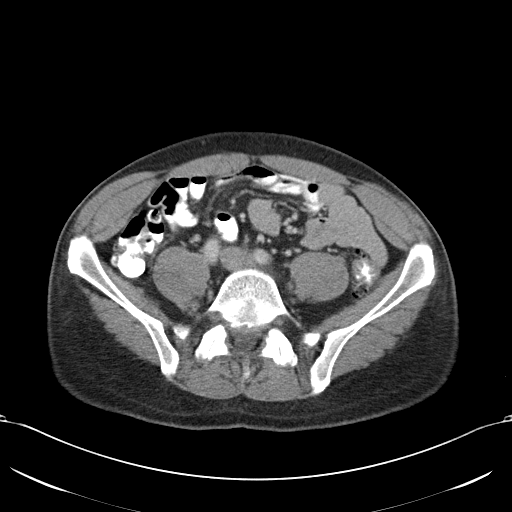
[im 48/88  soft-tissue]
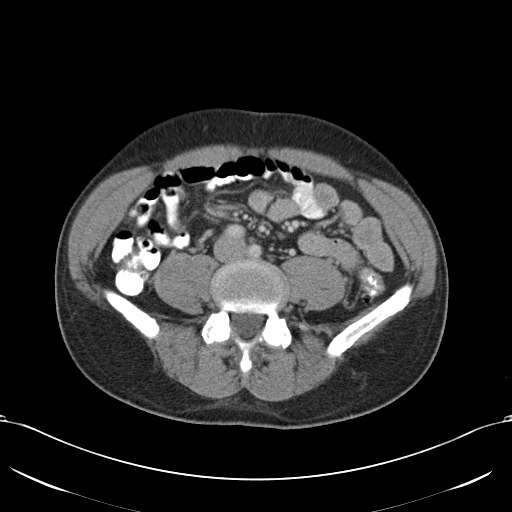
[im 57/88  soft-tissue]
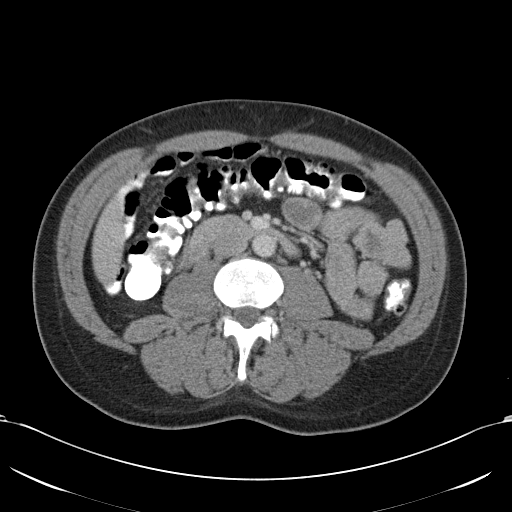
[im 57/88  bone]
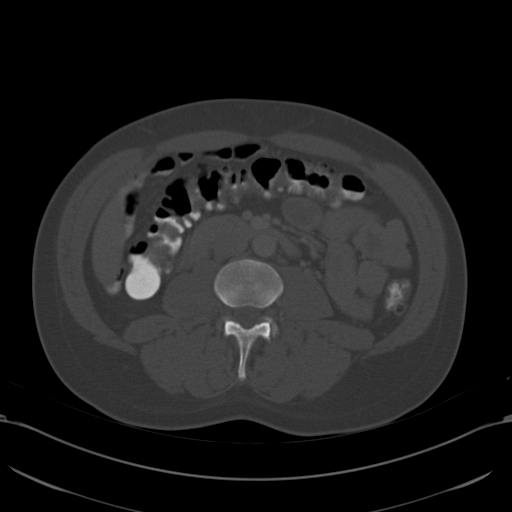
[im 61/88  soft-tissue]
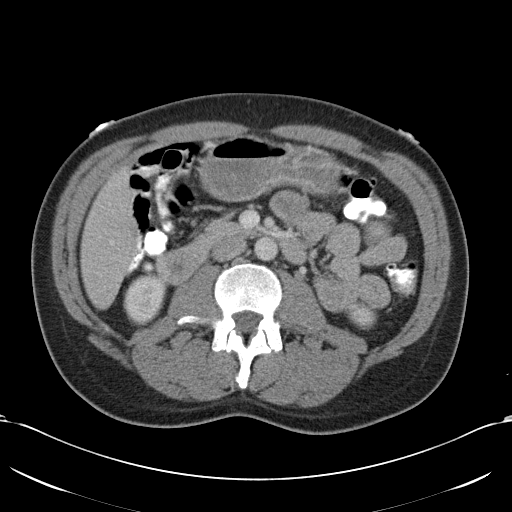
[im 70/88  soft-tissue]
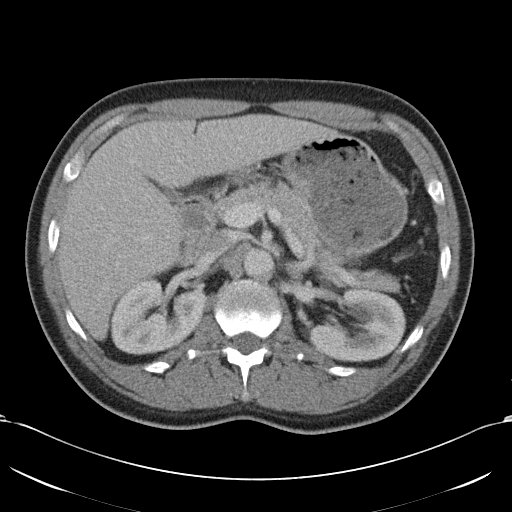
[im 74/88  soft-tissue]
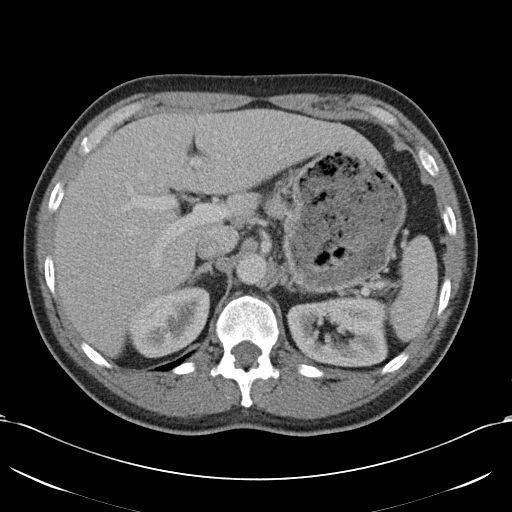
[im 83/88  soft-tissue]
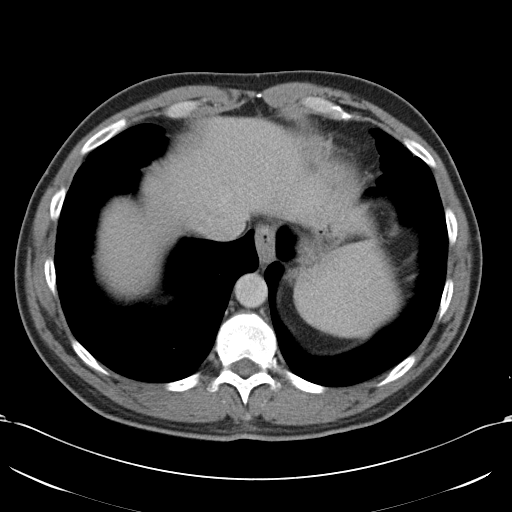

[Series 5: coronals · coronal · 0.71mm/px · 3 of 116 slices shown]
[im 39/116  soft-tissue]
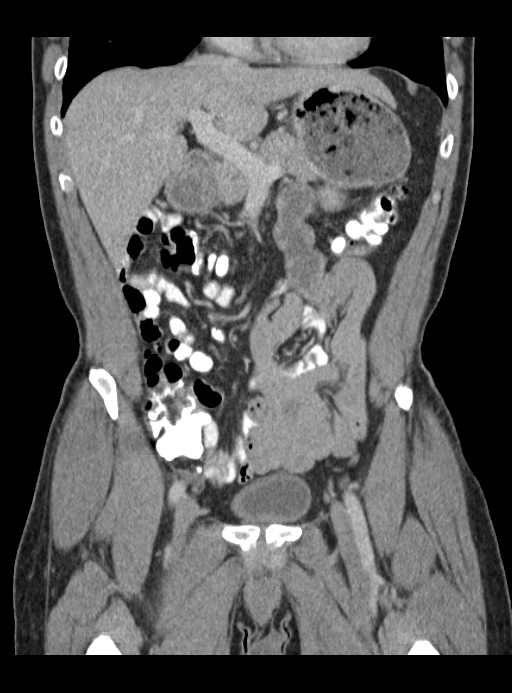
[im 52/116  soft-tissue]
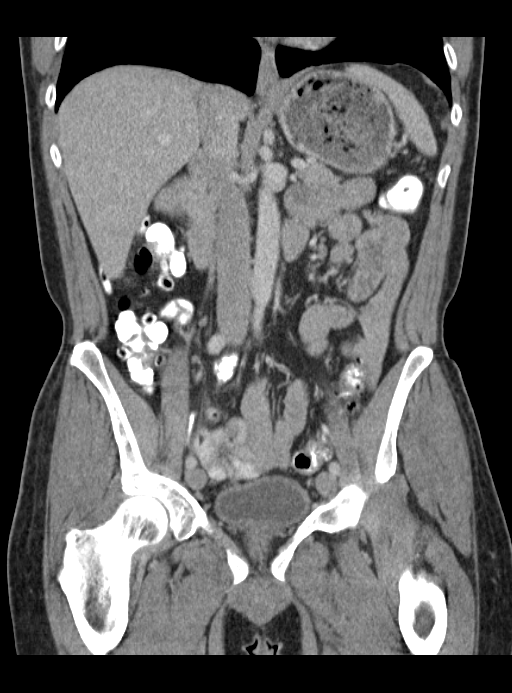
[im 64/116  soft-tissue]
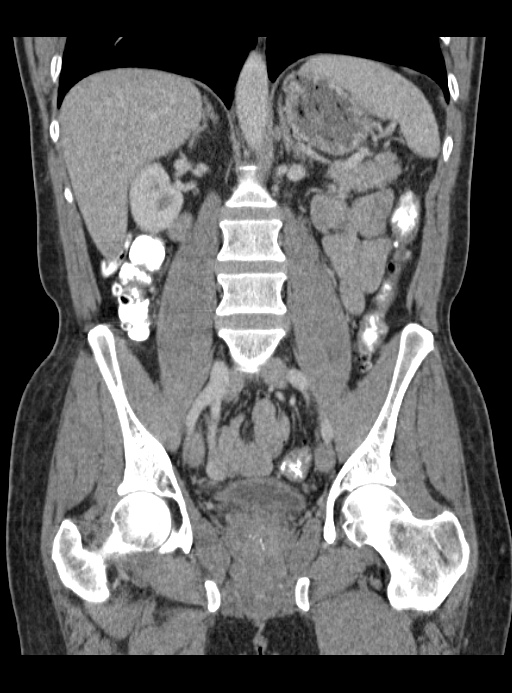

[16 of 46 positions shown; findings below may reference images not displayed]

FINDINGS: No focal abnormalities seen in the liver or spleen.  The
stomach, duodenum, pancreas, gallbladder, adrenal glands, and
kidneys have normal imaging features.

No abdominal aortic aneurysm.  No lymphadenopathy or free fluid in
the abdomen

Imaging through the pelvis shows no free intraperitoneal fluid.
There is no pelvic sidewall lymphadenopathy.  Bladder is
nondistended.  Prostate gland is mildly enlarged with associated
dystrophic calcification.

Diffuse diverticular changes seen throughout the length of the
colon without evidence for diverticulitis.  The terminal ileum is
normal.  The appendix is normal.

Right inguinal hernia again identified, containing only
preperitoneal fat.  No evidence for edema or fluid within the
hernia sac to suggest fatty infarction or incarceration.

Bone windows reveal no worrisome lytic or sclerotic osseous
lesions.
IMPRESSION: Right inguinal hernia without evidence for incarceration or
infarction of the herniated fat.

Otherwise unremarkable exam.

## 2014-01-02 IMAGING — CR DG CHEST 2V
2 series · 2 of 2 positions shown · non-contrast
Comparison: 07/16/2012

CLINICAL DATA: Chest pain and shortness breath.  Previous
myocardial infarct.

CHEST - 2 VIEW

[w chest pa]
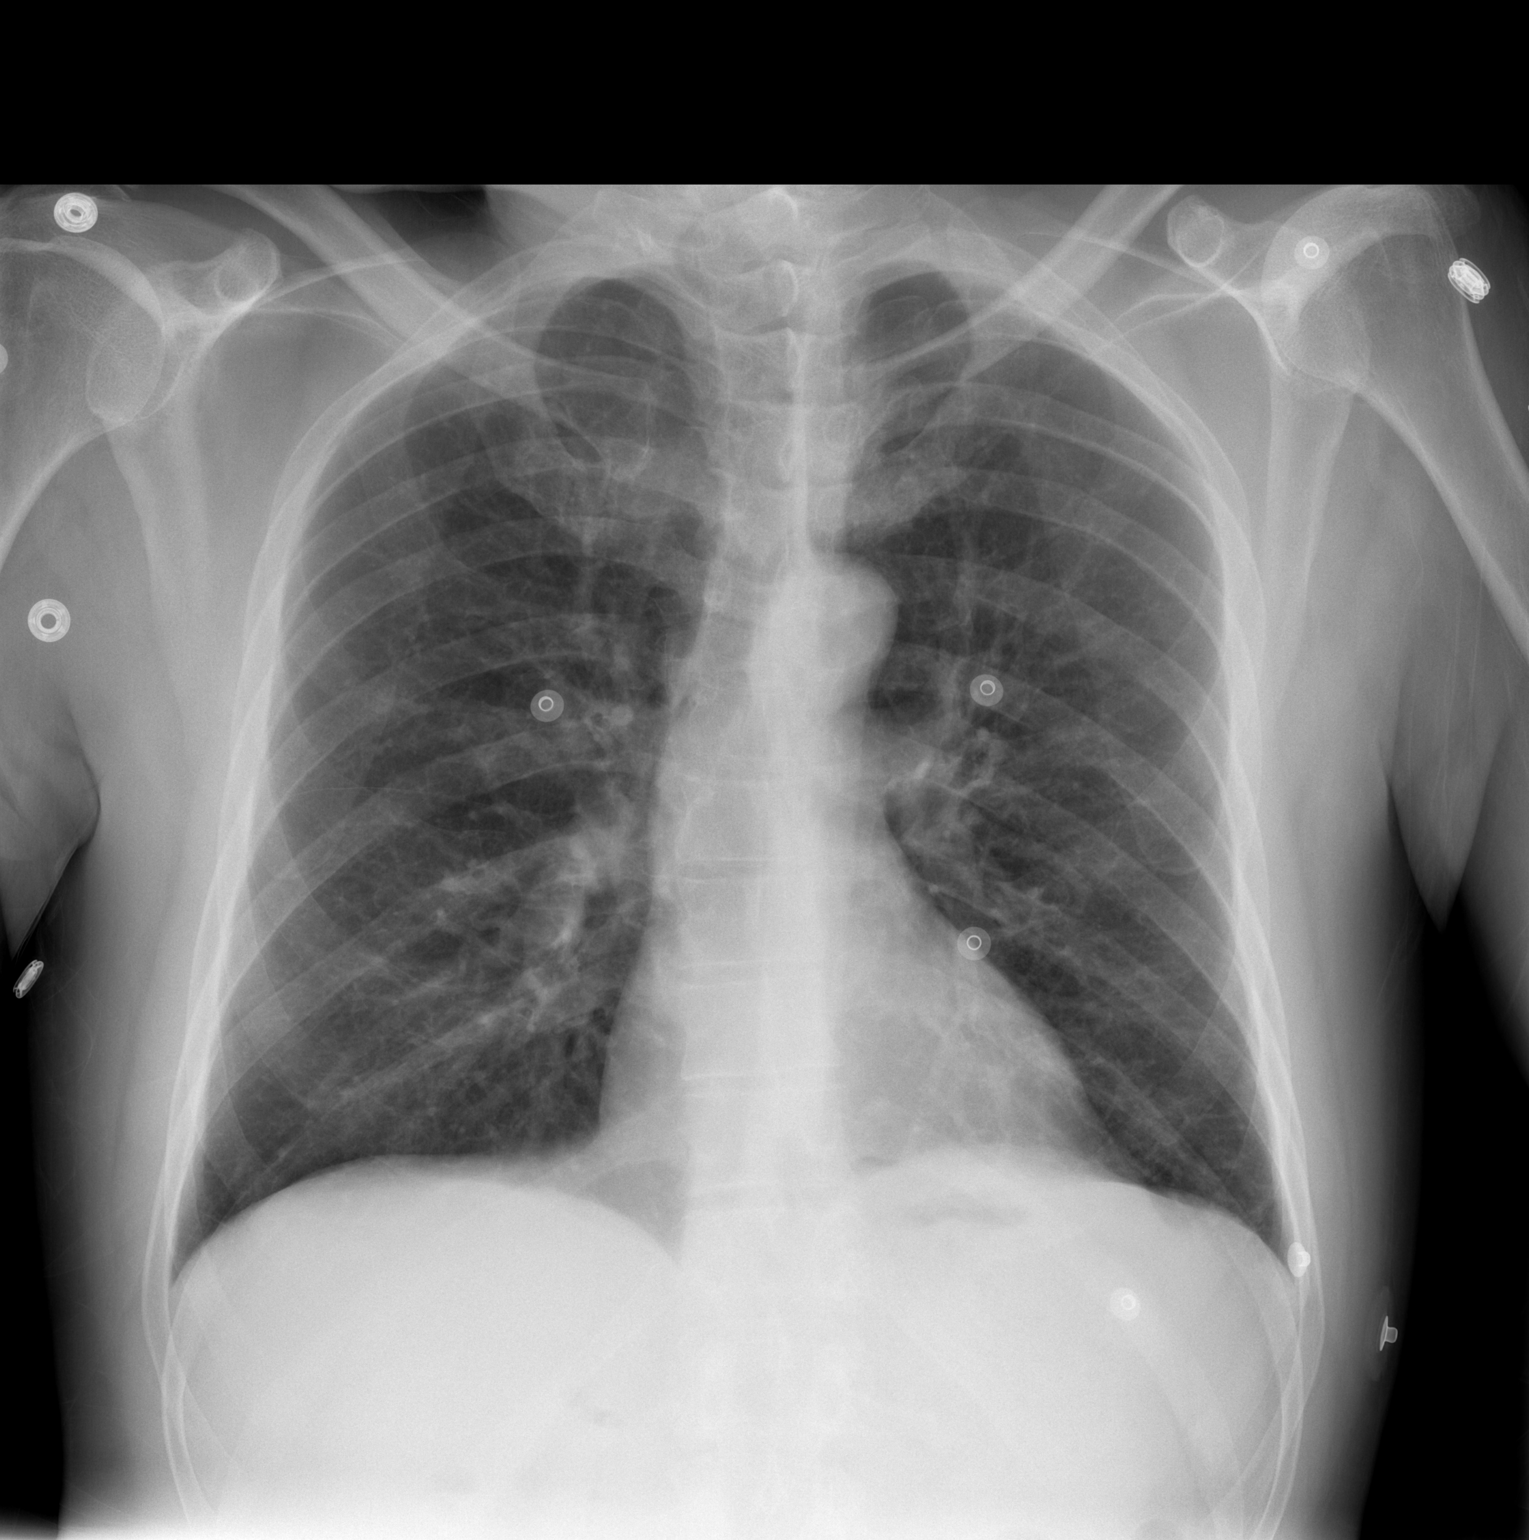

[w chest lat]
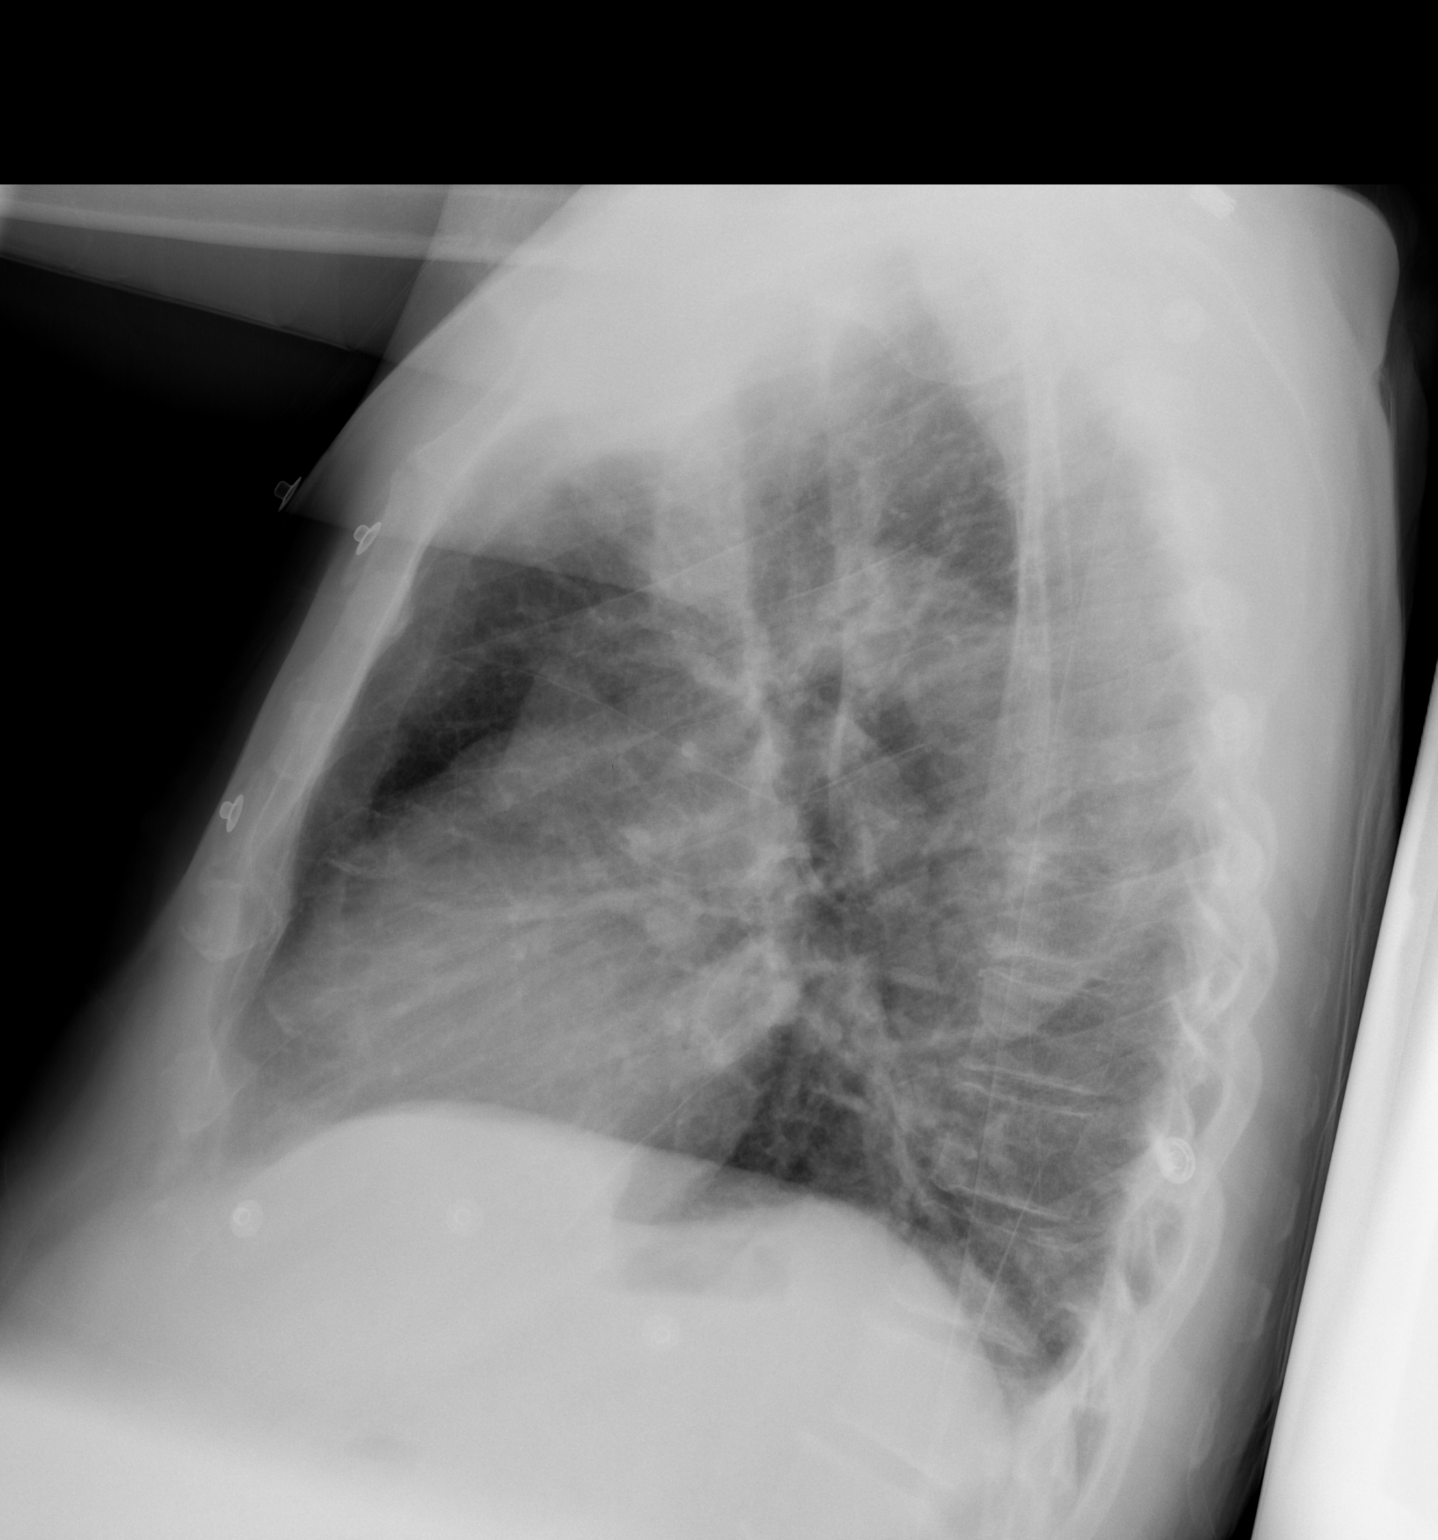

[2 of 2 positions shown; findings below may reference images not displayed]

FINDINGS: The heart size and mediastinal contours are within
normal limits.  Both lungs are clear.  The visualized skeletal
structures are unremarkable.
IMPRESSION: No active cardiopulmonary disease.

## 2014-05-30 ENCOUNTER — Encounter (HOSPITAL_COMMUNITY): Payer: Self-pay | Admitting: Cardiology

## 2014-07-04 ENCOUNTER — Emergency Department (HOSPITAL_COMMUNITY)
Admission: EM | Admit: 2014-07-04 | Discharge: 2014-07-05 | Disposition: A | Discharge status: Home / Self Care | Payer: Self-pay | Attending: Emergency Medicine | Admitting: Emergency Medicine

## 2014-07-04 ENCOUNTER — Emergency Department (HOSPITAL_COMMUNITY): Payer: Self-pay

## 2014-07-04 ENCOUNTER — Encounter (HOSPITAL_COMMUNITY): Payer: Self-pay | Admitting: *Deleted

## 2014-07-04 DIAGNOSIS — R197 Diarrhea, unspecified: Secondary | ICD-10-CM | POA: Insufficient documentation

## 2014-07-04 DIAGNOSIS — I252 Old myocardial infarction: Secondary | ICD-10-CM | POA: Insufficient documentation

## 2014-07-04 DIAGNOSIS — J069 Acute upper respiratory infection, unspecified: Secondary | ICD-10-CM | POA: Insufficient documentation

## 2014-07-04 DIAGNOSIS — I2510 Atherosclerotic heart disease of native coronary artery without angina pectoris: Secondary | ICD-10-CM | POA: Insufficient documentation

## 2014-07-04 DIAGNOSIS — Z9889 Other specified postprocedural states: Secondary | ICD-10-CM | POA: Insufficient documentation

## 2014-07-04 DIAGNOSIS — Z880 Allergy status to penicillin: Secondary | ICD-10-CM | POA: Insufficient documentation

## 2014-07-04 DIAGNOSIS — R519 Headache, unspecified: Secondary | ICD-10-CM

## 2014-07-04 DIAGNOSIS — R05 Cough: Secondary | ICD-10-CM

## 2014-07-04 DIAGNOSIS — R51 Headache: Secondary | ICD-10-CM

## 2014-07-04 DIAGNOSIS — R059 Cough, unspecified: Secondary | ICD-10-CM

## 2014-07-04 DIAGNOSIS — R112 Nausea with vomiting, unspecified: Secondary | ICD-10-CM | POA: Insufficient documentation

## 2014-07-04 DIAGNOSIS — Z720 Tobacco use: Secondary | ICD-10-CM | POA: Insufficient documentation

## 2014-07-04 LAB — CBC WITH DIFFERENTIAL/PLATELET
BASOS ABS: 0 10*3/uL (ref 0.0–0.1)
Basophils Relative: 1 % (ref 0–1)
EOS ABS: 0 10*3/uL (ref 0.0–0.7)
EOS PCT: 1 % (ref 0–5)
HCT: 42.8 % (ref 39.0–52.0)
Hemoglobin: 15.2 g/dL (ref 13.0–17.0)
LYMPHS ABS: 1.3 10*3/uL (ref 0.7–4.0)
Lymphocytes Relative: 37 % (ref 12–46)
MCH: 33.9 pg (ref 26.0–34.0)
MCHC: 35.5 g/dL (ref 30.0–36.0)
MCV: 95.3 fL (ref 78.0–100.0)
MONO ABS: 0.6 10*3/uL (ref 0.1–1.0)
MONOS PCT: 15 % — AB (ref 3–12)
NEUTROS ABS: 1.7 10*3/uL (ref 1.7–7.7)
Neutrophils Relative %: 47 % (ref 43–77)
Platelets: 167 10*3/uL (ref 150–400)
RBC: 4.49 MIL/uL (ref 4.22–5.81)
RDW: 13.9 % (ref 11.5–15.5)
WBC: 3.6 10*3/uL — AB (ref 4.0–10.5)

## 2014-07-04 LAB — COMPREHENSIVE METABOLIC PANEL
ALT: 28 U/L (ref 0–53)
ANION GAP: 11 (ref 5–15)
AST: 34 U/L (ref 0–37)
Albumin: 4 g/dL (ref 3.5–5.2)
Alkaline Phosphatase: 58 U/L (ref 39–117)
BUN: 15 mg/dL (ref 6–23)
CALCIUM: 8.7 mg/dL (ref 8.4–10.5)
CO2: 21 mmol/L (ref 19–32)
Chloride: 103 mEq/L (ref 96–112)
Creatinine, Ser: 0.84 mg/dL (ref 0.50–1.35)
GLUCOSE: 102 mg/dL — AB (ref 70–99)
Potassium: 3.7 mmol/L (ref 3.5–5.1)
SODIUM: 135 mmol/L (ref 135–145)
TOTAL PROTEIN: 7.2 g/dL (ref 6.0–8.3)
Total Bilirubin: 0.3 mg/dL (ref 0.3–1.2)

## 2014-07-04 LAB — I-STAT TROPONIN, ED: Troponin i, poc: 0 ng/mL (ref 0.00–0.08)

## 2014-07-04 MED ORDER — HYDROMORPHONE HCL 1 MG/ML IJ SOLN
1.0000 mg | Freq: Once | INTRAMUSCULAR | Status: AC
  Administered 2014-07-04: 1 mg via INTRAVENOUS
  Filled 2014-07-04: qty 1

## 2014-07-04 MED ORDER — SODIUM CHLORIDE 0.9 % IV BOLUS (SEPSIS)
1000.0000 mL | INTRAVENOUS | Status: AC
  Administered 2014-07-04: 1000 mL via INTRAVENOUS

## 2014-07-04 MED ORDER — DIPHENHYDRAMINE HCL 50 MG/ML IJ SOLN
25.0000 mg | Freq: Once | INTRAMUSCULAR | Status: AC
  Administered 2014-07-04: 25 mg via INTRAVENOUS
  Filled 2014-07-04: qty 1

## 2014-07-04 MED ORDER — OXYCODONE-ACETAMINOPHEN 5-325 MG PO TABS: 1.0000 | ORAL_TABLET | Freq: Four times a day (QID) | ORAL | Status: DC | PRN

## 2014-07-04 MED ORDER — DEXAMETHASONE SODIUM PHOSPHATE 10 MG/ML IJ SOLN
10.0000 mg | Freq: Once | INTRAMUSCULAR | Status: AC
  Administered 2014-07-05: 10 mg via INTRAVENOUS
  Filled 2014-07-04: qty 1

## 2014-07-04 MED ORDER — METOCLOPRAMIDE HCL 5 MG/ML IJ SOLN
10.0000 mg | Freq: Once | INTRAMUSCULAR | Status: AC
  Administered 2014-07-04: 10 mg via INTRAVENOUS
  Filled 2014-07-04: qty 2

## 2014-07-04 MED ORDER — HYDROMORPHONE HCL 1 MG/ML IJ SOLN
0.5000 mg | Freq: Once | INTRAMUSCULAR | Status: AC
  Administered 2014-07-04: 0.5 mg via INTRAVENOUS
  Filled 2014-07-04: qty 1

## 2014-07-04 NOTE — ED Notes (Signed)
Bed: WA21 Expected date:  Expected time:  Means of arrival:  Comments: EMS 54 yo male with headache and chest wall pain/normal 12 lead

## 2014-07-04 NOTE — ED Notes (Signed)
Pt reports h/a x 4 days.  Has had cold sxs x 1 month.  Initially coughing up white sputum and is now clear.  Pt reports pain in his chest and abd when coughing.  Pt also reports not being able to keep anything down d/t vomiting.

## 2014-07-04 NOTE — ED Provider Notes (Signed)
CSN: [id number]     Arrival date & time 07/04/14  2112 History   First MD Initiated Contact with Patient 07/04/14 2119     Chief Complaint  Patient presents with  . Headache  . Vomiting  . Chest Pain     (Consider location/radiation/quality/duration/timing/severity/associated sxs/prior Treatment) Patient is a 54 y.o. male presenting with headaches and chest pain.  Headache Pain location:  R temporal and L temporal Quality:  Dull Radiates to:  Does not radiate Severity currently:  10/10 Severity at highest:  10/10 Onset quality:  Gradual Duration:  4 days Timing:  Constant Progression:  Unchanged Chronicity:  New Context comment:  URI Relieved by:  Nothing Worsened by:  Nothing tried Ineffective treatments:  NSAIDs Associated symptoms: diarrhea, nausea and vomiting (in the last 1-2 days)   Associated symptoms: no abdominal pain, no cough, no pain, no fever, no neck pain and no numbness   Diarrhea:    Quality:  Watery Vomiting:    Quality:  Stomach contents Chest Pain Associated symptoms: headache, nausea and vomiting (in the last 1-2 days)   Associated symptoms: no abdominal pain, no cough, no fever, no numbness and no shortness of breath     Past Medical History  Diagnosis Date  . MI, old   . Coronary artery disease    Past Surgical History  Procedure Laterality Date  . Hernia repair    . Left heart catheterization with coronary angiogram N/A 07/17/2012    Procedure: LEFT HEART CATHETERIZATION WITH CORONARY ANGIOGRAM;  Surgeon: Pamella Pert, MD;  Location: [facility] CATH LAB;  Service: Cardiovascular;  Laterality: N/A;   Family History  Problem Relation Age of Onset  . Diabetes Mellitus II Mother   . Diabetes Mellitus II Father    History  Substance Use Topics  . Smoking status: Current Every Day Smoker -- 0.25 packs/day    Types: Cigarettes  . Smokeless tobacco: Not on file  . Alcohol Use: Yes     Comment: occ    Review of Systems  Constitutional:  Negative for fever.  HENT: Negative for drooling and rhinorrhea.   Eyes: Negative for pain.  Respiratory: Negative for cough and shortness of breath.   Cardiovascular: Positive for chest pain (chest wall pain). Negative for leg swelling.  Gastrointestinal: Positive for nausea, vomiting (in the last 1-2 days) and diarrhea. Negative for abdominal pain.  Genitourinary: Negative for dysuria and hematuria.  Musculoskeletal: Negative for gait problem and neck pain.  Skin: Negative for color change.  Neurological: Positive for headaches. Negative for numbness.  Hematological: Negative for adenopathy.  Psychiatric/Behavioral: Negative for behavioral problems.  All other systems reviewed and are negative.     Allergies  Penicillins  Home Medications   Prior to Admission medications   Medication Sig Start Date End Date Taking? Authorizing Provider  aspirin 325 MG tablet Take 325 mg by mouth every 4 (four) hours as needed for pain.    Historical Provider, MD   BP 118/83 mmHg  Pulse 110  Temp(Src) 98.7 F (37.1 C) (Oral)  Resp 18  SpO2 95% Physical Exam  Constitutional: He is oriented to person, place, and time. He appears well-developed and well-nourished.  HENT:  Head: Normocephalic and atraumatic.  Right Ear: External ear normal.  Left Ear: External ear normal.  Nose: Nose normal.  Mouth/Throat: Oropharynx is clear and moist. No oropharyngeal exudate.  Eyes: Conjunctivae and EOM are normal. Pupils are equal, round, and reactive to light.  Neck: Normal range of motion. Neck  supple.  Cardiovascular: Normal rate, regular rhythm, normal heart sounds and intact distal pulses.  Exam reveals no gallop and no friction rub.   No murmur heard. Pulmonary/Chest: Effort normal and breath sounds normal. No respiratory distress. He has no wheezes.  Abdominal: Soft. Bowel sounds are normal. He exhibits no distension. There is no tenderness. There is no rebound and no guarding.  Musculoskeletal:  Normal range of motion. He exhibits no edema or tenderness.  Neurological: He is alert and oriented to person, place, and time.  alert, oriented x3 speech: normal in context and clarity memory: intact grossly cranial nerves II-XII: intact motor strength: full proximally and distally sensation: intact to light touch diffusely  cerebellar: finger-to-nose and heel-to-shin intact gait: normal forwards and backwards  Skin: Skin is warm and dry.  Psychiatric: He has a normal mood and affect. His behavior is normal.  Nursing note and vitals reviewed.   ED Course  Procedures (including critical care time) Labs Review Labs Reviewed  CBC WITH DIFFERENTIAL - Abnormal; Notable for the following:    WBC 3.6 (*)    Monocytes Relative 15 (*)    All other components within normal limits  COMPREHENSIVE METABOLIC PANEL - Abnormal; Notable for the following:    Glucose, Bld 102 (*)    All other components within normal limits  I-STAT TROPOININ, ED    Imaging Review Dg Chest 2 View  07/04/2014   CLINICAL DATA:  Cough for 1 month, increased over the last 4 days.  EXAM: CHEST  2 VIEW  COMPARISON:  05/26/2013  FINDINGS: Improved lung aeration from prior, lungs are mildly hyperinflated. The cardiomediastinal contours are normal. There is bronchial thickening. No consolidation. Pulmonary vasculature is normal. No pleural effusion or pneumothorax. No acute osseous abnormalities are seen.  IMPRESSION: Bronchial thickening and mild hyperinflation, may be chronic or reflect bronchitis. No consolidation to suggest pneumonia.   Electronically Signed   By: Rubye Oaks M.D.   On: 07/04/2014 22:24     EKG Interpretation   Date/Time:  Thursday July 04 2014 21:24:28 EST Ventricular Rate:  97 PR Interval:  149 QRS Duration: 93 QT Interval:  350 QTC Calculation: 445 R Axis:   86 Text Interpretation:  Sinus rhythm No significant change since last  tracing Confirmed by Kam Rahimi  MD, Barre Aydelott ([id number]) on  07/04/2014 9:39:21 PM      MDM   Final diagnoses:  Cough  Headache, unspecified headache type  URI (upper respiratory infection)    9:39 PM 54 y.o. male w hx of CAD, smoker  Who presents with URI symptoms for the last month. He notes a cough productive of white sputum. He denies any fevers. He states he developed a gradual onset bitemporal headache proximally 4 days ago which has not been relieved with NSAIDs. He currently has a 10 out of 10 headache. He also complains of chest wall pain with coughing. Also some abdominal soreness with coughing. He is afebrile and mildly tachycardic here. Vital signs otherwise unremarkable. He has a normal neurologic exam. We'll treat symptomatically.  11:56 PM: Pt feeling better on exam. I interpreted/reviewed the labs and/or imaging which were non-contributory. Sx likely related to URI.  I have discussed the diagnosis/risks/treatment options with the patient and believe the pt to be eligible for discharge home to follow-up with and estab w/ a pcp. We also discussed returning to the ED immediately if new or worsening sx occur. We discussed the sx which are most concerning (e.g., worsening HA, fever, inc vomiting,  feeling of dehydration, sob, cp) that necessitate immediate return. Medications administered to the patient during their visit and any new prescriptions provided to the patient are listed below.  Medications given during this visit Medications  sodium chloride 0.9 % bolus 1,000 mL (1,000 mLs Intravenous New Bag/Given 07/04/14 2328)  dexamethasone (DECADRON) injection 10 mg (not administered)  sodium chloride 0.9 % bolus 1,000 mL (0 mLs Intravenous Stopped 07/04/14 2309)  metoCLOPramide (REGLAN) injection 10 mg (10 mg Intravenous Given 07/04/14 2216)  diphenhydrAMINE (BENADRYL) injection 25 mg (25 mg Intravenous Given 07/04/14 2211)  HYDROmorphone (DILAUDID) injection 0.5 mg (0.5 mg Intravenous Given 07/04/14 2213)  HYDROmorphone (DILAUDID) injection 1 mg  (1 mg Intravenous Given 07/04/14 2328)    New Prescriptions   OXYCODONE-ACETAMINOPHEN (PERCOCET) 5-325 MG PER TABLET    Take 1 tablet by mouth every 6 (six) hours as needed for moderate pain.     Purvis SheffieldForrest Maxim Bedel, MD 07/05/14 1112

## 2014-07-05 NOTE — ED Notes (Signed)
Pt calling for transport

## 2014-08-29 ENCOUNTER — Emergency Department (HOSPITAL_COMMUNITY)
Admission: EM | Admit: 2014-08-29 | Discharge: 2014-08-29 | Disposition: A | Discharge status: Home / Self Care | Payer: Self-pay | Attending: Emergency Medicine | Admitting: Emergency Medicine

## 2014-08-29 ENCOUNTER — Encounter (HOSPITAL_COMMUNITY): Payer: Self-pay | Admitting: Physical Medicine and Rehabilitation

## 2014-08-29 DIAGNOSIS — R197 Diarrhea, unspecified: Secondary | ICD-10-CM | POA: Insufficient documentation

## 2014-08-29 DIAGNOSIS — R112 Nausea with vomiting, unspecified: Secondary | ICD-10-CM | POA: Insufficient documentation

## 2014-08-29 DIAGNOSIS — R109 Unspecified abdominal pain: Secondary | ICD-10-CM | POA: Insufficient documentation

## 2014-08-29 DIAGNOSIS — R51 Headache: Secondary | ICD-10-CM | POA: Insufficient documentation

## 2014-08-29 DIAGNOSIS — I252 Old myocardial infarction: Secondary | ICD-10-CM | POA: Insufficient documentation

## 2014-08-29 DIAGNOSIS — Z880 Allergy status to penicillin: Secondary | ICD-10-CM | POA: Insufficient documentation

## 2014-08-29 DIAGNOSIS — I2510 Atherosclerotic heart disease of native coronary artery without angina pectoris: Secondary | ICD-10-CM | POA: Insufficient documentation

## 2014-08-29 DIAGNOSIS — R519 Headache, unspecified: Secondary | ICD-10-CM

## 2014-08-29 DIAGNOSIS — Z720 Tobacco use: Secondary | ICD-10-CM | POA: Insufficient documentation

## 2014-08-29 DIAGNOSIS — R1084 Generalized abdominal pain: Secondary | ICD-10-CM

## 2014-08-29 DIAGNOSIS — Z9889 Other specified postprocedural states: Secondary | ICD-10-CM | POA: Insufficient documentation

## 2014-08-29 LAB — CBC WITH DIFFERENTIAL/PLATELET
BASOS PCT: 1 % (ref 0–1)
Basophils Absolute: 0 10*3/uL (ref 0.0–0.1)
EOS ABS: 0.1 10*3/uL (ref 0.0–0.7)
EOS PCT: 2 % (ref 0–5)
HEMATOCRIT: 45.1 % (ref 39.0–52.0)
HEMOGLOBIN: 16.1 g/dL (ref 13.0–17.0)
Lymphocytes Relative: 46 % (ref 12–46)
Lymphs Abs: 1.9 10*3/uL (ref 0.7–4.0)
MCH: 33.6 pg (ref 26.0–34.0)
MCHC: 35.7 g/dL (ref 30.0–36.0)
MCV: 94.2 fL (ref 78.0–100.0)
MONO ABS: 0.4 10*3/uL (ref 0.1–1.0)
MONOS PCT: 9 % (ref 3–12)
NEUTROS PCT: 42 % — AB (ref 43–77)
Neutro Abs: 1.7 10*3/uL (ref 1.7–7.7)
PLATELETS: 197 10*3/uL (ref 150–400)
RBC: 4.79 MIL/uL (ref 4.22–5.81)
RDW: 13.7 % (ref 11.5–15.5)
WBC: 4.1 10*3/uL (ref 4.0–10.5)

## 2014-08-29 LAB — COMPREHENSIVE METABOLIC PANEL
ALT: 25 U/L (ref 0–53)
AST: 29 U/L (ref 0–37)
Albumin: 3.9 g/dL (ref 3.5–5.2)
Alkaline Phosphatase: 71 U/L (ref 39–117)
Anion gap: 7 (ref 5–15)
BUN: 11 mg/dL (ref 6–23)
CO2: 27 mmol/L (ref 19–32)
Calcium: 9.2 mg/dL (ref 8.4–10.5)
Chloride: 104 mmol/L (ref 96–112)
Creatinine, Ser: 0.87 mg/dL (ref 0.50–1.35)
GFR calc non Af Amer: >90 mL/min (ref >90)
Glucose, Bld: 98 mg/dL (ref 70–99)
Potassium: 4.3 mmol/L (ref 3.5–5.1)
SODIUM: 138 mmol/L (ref 135–145)
TOTAL PROTEIN: 6.9 g/dL (ref 6.0–8.3)
Total Bilirubin: 0.5 mg/dL (ref 0.3–1.2)

## 2014-08-29 MED ORDER — DIPHENHYDRAMINE HCL 50 MG/ML IJ SOLN
25.0000 mg | Freq: Once | INTRAMUSCULAR | Status: AC
  Administered 2014-08-29: 25 mg via INTRAVENOUS
  Filled 2014-08-29: qty 1

## 2014-08-29 MED ORDER — METOCLOPRAMIDE HCL 5 MG/ML IJ SOLN
10.0000 mg | Freq: Once | INTRAMUSCULAR | Status: AC
  Administered 2014-08-29: 10 mg via INTRAVENOUS
  Filled 2014-08-29: qty 2

## 2014-08-29 MED ORDER — MORPHINE SULFATE 4 MG/ML IJ SOLN
4.0000 mg | Freq: Once | INTRAMUSCULAR | Status: AC
  Administered 2014-08-29: 4 mg via INTRAVENOUS
  Filled 2014-08-29: qty 1

## 2014-08-29 MED ORDER — SODIUM CHLORIDE 0.9 % IV BOLUS (SEPSIS)
1000.0000 mL | Freq: Once | INTRAVENOUS | Status: AC
  Administered 2014-08-29: 1000 mL via INTRAVENOUS

## 2014-08-29 NOTE — ED Notes (Signed)
Pt presents to department for evaluation of nausea/vomiting and diarrhea x4 weeks. Also states headache and fatigue. Pt is alert and oriented x4.

## 2014-08-29 NOTE — ED Notes (Signed)
PA Tori at bedside. 

## 2014-08-29 NOTE — ED Provider Notes (Signed)
CSN: [id number]     Arrival date & time 08/29/14  1320 History   First MD Initiated Contact with Patient 08/29/14 1515     Chief Complaint  Patient presents with  . Diarrhea  . Emesis     (Consider location/radiation/quality/duration/timing/severity/associated sxs/prior Treatment) HPI  Sean Longsaul Anthony Martinez is a 54 y.o. male with PMH of coronary artery disease presenting with persistent nausea, vomiting, diarrhea for the past 3 weeks. Patient states it has been intermittent and he believes is associated with stress due to losing his mother in the past month. Patient denies any melanotic stool or hematochezia. No hematemesis. He denies fevers or chills. No abdominal surgeries. Patient has not taken anything for his symptoms. He denies alleviating or aggravating factors. Patient also with headaches that is like other headaches he's had before and states it feels like a "dehydration headache". He denies any chest pain, shortness of breath.   Past Medical History  Diagnosis Date  . MI, old   . Coronary artery disease    Past Surgical History  Procedure Laterality Date  . Hernia repair    . Left heart catheterization with coronary angiogram N/A 07/17/2012    Procedure: LEFT HEART CATHETERIZATION WITH CORONARY ANGIOGRAM;  Surgeon: Pamella PertJagadeesh R Ganji, MD;  [facility];  Service: Cardiovascular;  Laterality: N/A;   Family History  Problem Relation Age of Onset  . Diabetes Mellitus II Mother   . Diabetes Mellitus II Father    History  Substance Use Topics  . Smoking status: Current Every Day Smoker -- 0.25 packs/day    Types: Cigarettes  . Smokeless tobacco: Not on file  . Alcohol Use: Yes     Comment: occ    Review of Systems 10 Systems reviewed and are negative for acute change except as noted in the HPI.    Allergies  Penicillins  Home Medications   Prior to Admission medications   Medication Sig Start Date End Date Taking? Authorizing Provider  ibuprofen  (ADVIL,MOTRIN) 200 MG tablet Take 600 mg by mouth every 6 (six) hours as needed for moderate pain.    Historical Provider, MD  oxyCODONE-acetaminophen (PERCOCET) 5-325 MG per tablet Take 1 tablet by mouth every 6 (six) hours as needed for moderate pain. 07/04/14   Purvis SheffieldForrest Harrison, MD   BP 118/76 mmHg  Pulse 74  Temp(Src) 98.4 F (36.9 C) (Oral)  Resp 21  Ht 6\' 1"  (1.854 m)  Wt 182 lb (82.555 kg)  BMI 24.02 kg/m2  SpO2 100% Physical Exam  Constitutional: He appears well-developed and well-nourished. No distress.  HENT:  Head: Normocephalic and atraumatic.  Mouth/Throat: Oropharynx is clear and moist.  Eyes: Conjunctivae and EOM are normal. Right eye exhibits no discharge. Left eye exhibits no discharge.  Cardiovascular: Normal rate and regular rhythm.   Pulmonary/Chest: Effort normal and breath sounds normal. No respiratory distress. He has no wheezes.  Abdominal: Soft. Bowel sounds are normal. He exhibits no distension.  Mild diffuse abdominal tenderness without rebound, rigidity, guarding.  Neurological: He is alert. He exhibits normal muscle tone. Coordination normal.  Speech fluent and goal oriented. Patient moves all four extremities without any focal neurological deficits and has no facial droop. Normal gait.  Skin: Skin is warm and dry. He is not diaphoretic.  Nursing note and vitals reviewed.   ED Course  Procedures (including critical care time) Labs Review Labs Reviewed  CBC WITH DIFFERENTIAL/PLATELET - Abnormal; Notable for the following:    Neutrophils Relative % 42 (*)  All other components within normal limits  COMPREHENSIVE METABOLIC PANEL    Imaging Review No results found.   EKG Interpretation None      MDM   Final diagnoses:  Nausea vomiting and diarrhea  Acute nonintractable headache, unspecified headache type  Diffuse abdominal pain   Patient presenting with 3-4 weeks of nausea, vomiting, headache. VSS. Patient with mild diffuse abdominal  tenderness without any evidence of peritonitis. No focal neurological deficits. Lab work within normal limits. No vomiting while in ED. Patient tolerating fluids and crackers without difficulty. On repeat abdominal exam patient without any abdominal tenderness. Does not have a surgical abdomen. Patient's headache improved and doubt any acute intracranial process. Patient nontoxic and stable for discharge. He's been given referral to the wellness center to establish care and follow-up in one week.  Discussed return precautions with patient. Discussed all results and patient verbalizes understanding and agrees with plan.     Oswaldo Conroy, PA-C 08/29/14 1709  Purvis Sheffield, MD 08/29/14 1719

## 2014-08-29 NOTE — Discharge Instructions (Signed)
Return to the emergency room with worsening of symptoms, new symptoms or with symptoms that are concerning , especially fevers, abdominal pain in one area, unable to keep down fluids, blood in stool or vomit, severe pain, you feel faint, lightheaded or pass out. Drink plenty of fluids with electrolytes especially Gatorade.  BRAT diet: bananas, rice, applesauce, toast Call to make appointment with the wellness center to establish care and follow-up in one week. Or use below resources to establish care.    Emergency Department Resource Guide 1) Find a Doctor and Pay Out of Pocket Although you won't have to find out who is covered by your insurance plan, it is a good idea to ask around and get recommendations. You will then need to call the office and see if the doctor you have chosen will accept you as a new patient and what types of options they offer for patients who are self-pay. Some doctors offer discounts or will set up payment plans for their patients who do not have insurance, but you will need to ask so you aren't surprised when you get to your appointment.  2) Contact Your Local Health Department Not all health departments have doctors that can see patients for sick visits, but many do, so it is worth a call to see if yours does. If you don't know where your local health department is, you can check in your phone book. The CDC also has a tool to help you locate your state's health department, and many state websites also have listings of all of their local health departments.  3) Find a Walk-in Clinic If your illness is not likely to be very severe or complicated, you may want to try a walk in clinic. These are popping up all over the country in pharmacies, drugstores, and shopping centers. They're usually staffed by nurse practitioners or physician assistants that have been trained to treat common illnesses and complaints. They're usually fairly quick and inexpensive. However, if you have  serious medical issues or chronic medical problems, these are probably not your best option.  No Primary Care Doctor: - Call Health Connect at  [phone] - they can help you locate a primary care doctor that  accepts your insurance, provides certain services, etc. - Physician Referral Service- [phone]  Chronic Pain Problems: Organization         Address  Phone   Notes  [facility]  [phone] Patients need to be referred by their primary care doctor.   Medication Assistance: Organization         Address  Phone   Notes  [facility] Medication [facility] [address] [place], [place] [address] [phone] --Must be a resident of [facility] -- Must have NO insurance coverage whatsoever (no Medicaid/ Medicare, etc.) -- The pt. MUST have a primary care doctor that directs their care regularly and follows them in the community   MedAssist  [phone]   [employer]  [phone]) [phone]    Agencies that provide inexpensive medical care: Organization         Address  Phone   Notes  [facility]  [phone]   [facility]    [phone]   [facility] [address] [place], [place] [address] [phone]   [facility] [address], [place] [phone]   [facility]anned Parenthood    [phone]   [place]  [facility]    [phone]   [facility]  [address], [place] Phone:  [phone], Fax:  [phone] Hours of Operation:  9 am - 6 pm, M-F.  Also accepts Medicaid/Medicare and self-pay.  [facility] for [place], [address], [place] Phone: [phone], Fax: [phone]. Hours of Operation:  8:30 am - 5:30 pm, M-F.  Also accepts Medicaid and self-pay.  [facility] [place] [address] [place] Phone: [phone]   [place],  [place], [place] [phone], Ext. [phone] Mondays & Thursdays: 7-9 AM.  First 15 patients are seen on a first come, first serve basis.    [place] Providers:  Organization         Address  Phone   Notes  [facility] [address], [place] [phone] Also accepts self-pay patients.  [facility] [address], [place]  [phone]   [place], [address], [place] [phone]   [facility] [address], [place] [phone]   Lucianne Lei [address], [place]   [phone] Only accepts [place] Access [place] patients after they have their name applied to their card.   Self-Pay (no insurance) in [facility]:  Organization         Address  Phone   Notes  Sickle Cell Patients, [facility] [place] [phone]   [facility] [place] [phone]   [facility] [place]  [place], [place], [place] [phone]   [facility]/Dr. Osei-Bonsu  [address], [place] or [address], [place] [phone] Phone number for both [place] and [place] locations is the same.  Urgent Medical and [facility] [address] [place] [phone]   [facility] [address], [place] or [address] [phone]   [facility] [address], [place] [phone], phone; [phone], fax Sees patients 1st and 3rd Saturday of every month.  Must not qualify for public or private insurance (i.e. Medicaid, Medicare, [place] Health Choice, Veterans' Benefits)  Household income should be no more than 200% of the poverty level The clinic cannot treat you if you are pregnant or think you are pregnant   Sexually transmitted diseases are not treated at the clinic.    Dental Care: Organization         Address  Phone  Notes  [facility] [place] [phone] Accepts children up to age 73 who are enrolled in [place] or [place]; pregnant women with a Medicaid card; and children who have applied for Medicaid or [place] Health Choice, but were declined, whose parents can pay a reduced fee at time of service.  [facility] Department of [facility]  [address], [place] [phone] Accepts children up to age 75 who are enrolled in [place] or [place]; pregnant women with a Medicaid card; and children who have applied for Medicaid or [place] Health Choice, but were declined, whose parents can pay a reduced fee at time of  service.  [facility] Adult [facility] Access PROGRAM  [place] [phone] Patients are seen by appointment only. Walk-ins are not accepted. [place] will see patients 58 years of age and older. Monday - Tuesday (8am-5pm) Most Wednesdays (8:30-5pm) $30 per visit, cash only  [facility] Adult Dental Access PROGRAM  [address], [facility] [phone] Patients are seen by appointment only. Walk-ins are not accepted. [place] will see patients 28 years of age and older. One Wednesday Evening (Monthly: Volunteer Based).  $30 per visit, cash only  [place]  [phone] for adults; Children under age 3, call Graduate Pediatric Dentistry at [phone]. Children aged 84-14, please call [phone] to request a pediatric application.  Dental services are provided in all areas of dental care including fillings, crowns and bridges, complete and partial dentures, implants, gum treatment, root canals, and extractions. Preventive care is also provided. Treatment is provided to both adults and children. Patients  are selected via a lottery and there is often a waiting list.   [facility] [address], [place]  [phone] [email or web address]   [facility] [address] [place], [place] [phone], Ext. [phone] Second and Fourth Thursday of each month, opens at 6:30 AM; Clinic ends at 9 AM.  Patients are seen on a first-come first-served basis, and a limited number are seen during each clinic.   [facility]  [address] Hillard Danker [place], [place] [phone]   Eligibility Requirements You must have lived in [place], [place], or [place] counties for at least the last three months.   You cannot be eligible for state or federal sponsored [employer], including [employer], [place], or [employer].   You generally cannot be eligible for healthcare insurance through your employer.    How to apply: Eligibility screenings are held every Tuesday and Wednesday afternoon from 1:00 pm until 4:00 pm. You do not need an appointment for the interview!  [facility] [address], [place], [place]   [place]  [place] Department  [place]  [phone]    Behavioral Health Resources in the Community: Intensive Outpatient Programs Organization         Address  Phone  Notes  [place]. [address] [place], [place] [phone]   [facility] [address], [place], [place]   ADS: Alcohol & Drug Svcs [address], [place], [place]   [place] [address],  [place], [place] or [phone]   Substance Abuse Resources Organization         Address  Phone  Notes  Alcohol and Drug Services  [phone]   [place]  [phone]   [place]   [place] Pester  [phone]    Residential & Outpatient Substance Abuse Program  [phone]   Psychological Services Organization         Address  Phone  Notes  [facility] [place]  [place]  [phone]   Central [address] [place] [phone] or [phone]    Mobile Crisis Teams Organization         Address  Phone  Notes  [facility], Mobile Crisis Care Unit  [phone]   [facility]  [address] [place], [place] [phone]   [facility] [address] [place] [phone]    Self-Help/Support Groups Organization         Address  Phone             Notes  [facility] of [place] - variety of support groups  [phone]- I7437963 Call for more information  Narcotics Anonymous (NA), [facility]aring [facility] [address], [employer] [place]  2 meetings at this location   Statistician         Address  Phone  Notes  [facility] [address],    [place]  [phone]   [facility]  [address], [place] [address], [place], [place] [phone]   [facility] [address] [place], [place] [phone] Admissions: 8am-3pm M-F  [facility] [address],    [place], [place] [phone]   [facility] [address] [place], [place], [place] [phone]   [facility] [address]  [place], [place] [phone]   [facility] - Intensive Outpatient [address], [place], [place] [phone]   [facility] (Addiction Recovery Care [facility]oc[facility] [address] [place]  [place], [place] [phone] or [phone]   [facility] ([facility]) [address] [place], [place] [phone] Accepts Medicaid  [facility] [address],  [place] [phone] Substance Abuse/Addiction Treatment   [facility] Organization         Address  Phone  Notes  [facility]  [phone]   Angie Fava, PhD [address] Ervin Knack [place], [place]   [phone] or [phone]   [facility]   [address] [place], [place] [phone]   [facility] [address], [place], [place] [phone] Insurance/Medicaid/sponsorship through [facility] Families [address]                                    [place], [place] [phone] Therapy/tele-psych/case  [facility] [address], [place] [phone]    Dr. Lolly Mustache  [phone]   [facility] [place]  [employer] [facility]. 1) [address] [place] 2) [address] [place] 3)  [address], [place] [phone]  [phone]   [facility] Child Abuse Hotline (808)252-5637 or [phone] (After Hours)

## 2014-08-29 NOTE — ED Notes (Signed)
Phlebotomy at bedside.

## 2014-12-18 ENCOUNTER — Emergency Department (HOSPITAL_COMMUNITY)
Admission: EM | Admit: 2014-12-18 | Discharge: 2014-12-18 | Disposition: A | Discharge status: Home / Self Care | Payer: Self-pay | Attending: Emergency Medicine | Admitting: Emergency Medicine

## 2014-12-18 ENCOUNTER — Encounter (HOSPITAL_COMMUNITY): Payer: Self-pay | Admitting: *Deleted

## 2014-12-18 DIAGNOSIS — M545 Low back pain, unspecified: Secondary | ICD-10-CM

## 2014-12-18 DIAGNOSIS — Z720 Tobacco use: Secondary | ICD-10-CM | POA: Insufficient documentation

## 2014-12-18 DIAGNOSIS — Z880 Allergy status to penicillin: Secondary | ICD-10-CM | POA: Insufficient documentation

## 2014-12-18 DIAGNOSIS — I252 Old myocardial infarction: Secondary | ICD-10-CM | POA: Insufficient documentation

## 2014-12-18 DIAGNOSIS — I2510 Atherosclerotic heart disease of native coronary artery without angina pectoris: Secondary | ICD-10-CM | POA: Insufficient documentation

## 2014-12-18 MED ORDER — CYCLOBENZAPRINE HCL 10 MG PO TABS: 10.0000 mg | ORAL_TABLET | Freq: Two times a day (BID) | ORAL | Status: DC | PRN

## 2014-12-18 MED ORDER — IBUPROFEN 800 MG PO TABS
800.0000 mg | ORAL_TABLET | Freq: Three times a day (TID) | ORAL | Status: DC
Start: 2014-12-18 — End: 2019-04-12

## 2014-12-18 NOTE — ED Notes (Signed)
Questions r/t dc were denied. Pt ambulatory and a&ox4 

## 2014-12-18 NOTE — ED Notes (Signed)
Patient came into the ED today d/t back pain left lower side. He denied injury to area.

## 2014-12-18 NOTE — Discharge Instructions (Signed)

## 2014-12-18 NOTE — ED Provider Notes (Signed)
CSN: [id number]     Arrival date & time 12/18/14  1617 History  This chart was scribed for non-physician practitioner, Roxy Horsemanobert Orbie Grupe, PA-C working with No att. providers found by Placido SouLogan Joldersma, ED scribe. This patient was seen in room WTR5/WTR5 and the patient's care was started at 4:40 PM.     No chief complaint on file.  The history is provided by the patient. No language interpreter was used.    HPI Comments: Sean Martinez is a 54 y.o. male who presents to the Emergency Department complaining of constant, moderate, lower back pain with onset yesterday morning. He notes worsening symptoms with ambulation or when coughing but denies any recent trauma. Pt further denies any recent back pain or history of back surgeries. Pt denies taking any medications for his symptoms. He denies incontinence of his bowels or bladder or any other associated symptoms.   Past Medical History  Diagnosis Date  . MI, old   . Coronary artery disease    Past Surgical History  Procedure Laterality Date  . Hernia repair    . Left heart catheterization with coronary angiogram N/A 07/17/2012    Procedure: LEFT HEART CATHETERIZATION WITH CORONARY ANGIOGRAM;  Surgeon: Pamella PertJagadeesh R Ganji, MD;  [facility];  Service: Cardiovascular;  Laterality: N/A;   Family History  Problem Relation Age of Onset  . Diabetes Mellitus II Mother   . Diabetes Mellitus II Father    History  Substance Use Topics  . Smoking status: Current Every Day Smoker -- 0.25 packs/day    Types: Cigarettes  . Smokeless tobacco: Not on file  . Alcohol Use: Yes     Comment: occ    Review of Systems  Gastrointestinal: Negative for diarrhea.       Denies incontinence of bowels  Genitourinary: Negative for urgency and frequency.       Denies incontinence of bladder  Musculoskeletal: Positive for myalgias and back pain.      Allergies  Penicillins  Home Medications   Prior to Admission medications   Medication Sig  Start Date End Date Taking? Authorizing Provider  ibuprofen (ADVIL,MOTRIN) 200 MG tablet Take 600 mg by mouth every 6 (six) hours as needed for moderate pain.    Historical Provider, MD  oxyCODONE-acetaminophen (PERCOCET) 5-325 MG per tablet Take 1 tablet by mouth every 6 (six) hours as needed for moderate pain. 07/04/14   Purvis SheffieldForrest Harrison, MD   There were no vitals taken for this visit. Physical Exam  Constitutional: He is oriented to person, place, and time. He appears well-developed and well-nourished. No distress.  HENT:  Head: Normocephalic and atraumatic.  Mouth/Throat: Oropharynx is clear and moist.  Eyes: Conjunctivae and EOM are normal. Pupils are equal, round, and reactive to light. Right eye exhibits no discharge. Left eye exhibits no discharge. No scleral icterus.  Neck: Normal range of motion. Neck supple. No tracheal deviation present.  Cardiovascular: Normal rate, regular rhythm and normal heart sounds.  Exam reveals no gallop and no friction rub.   No murmur heard. Pulmonary/Chest: Effort normal and breath sounds normal. No respiratory distress. He has no wheezes.  Abdominal: Soft. He exhibits no distension. There is no tenderness.  Musculoskeletal: Normal range of motion.  Lumbar paraspinal muscles tender to palpation, no bony tenderness, step-offs, or gross abnormality or deformity of spine, patient is able to ambulate, moves all extremities  Bilateral great toe extension intact Bilateral plantar/dorsiflexion intact  Neurological: He is alert and oriented to person, place, and time.  Sensation and strength intact bilaterally   Skin: Skin is warm and dry. He is not diaphoretic.  Psychiatric: He has a normal mood and affect. His behavior is normal. Judgment and thought content normal.  Nursing note and vitals reviewed.   ED Course  Procedures  DIAGNOSTIC STUDIES: Oxygen Saturation is 99% on RA, normal by my interpretation.    COORDINATION OF CARE: 4:42 PM Discussed  treatment plan with pt at bedside and pt agreed to plan.  Labs Review Labs Reviewed - No data to display  Imaging Review No results found.   EKG Interpretation None      MDM   Final diagnoses:  Left-sided low back pain without sciatica    Patient with back pain.  No neurological deficits and normal neuro exam.  Patient is ambulatory.  No loss of bowel or bladder control.  Doubt cauda equina.  Denies fever,  doubt epidural abscess or other lesion. Recommend back exercises, stretching, RICE, and will treat with a short course of flexeril and NSAIDs.  Encouraged the patient that there could be a need for additional workup and/or imaging such as MRI, if the symptoms do not resolve. Patient advised that if the back pain does not resolve, or radiates, this could progress to more serious conditions and is encouraged to follow-up with PCP or orthopedics within 2 weeks.     I personally performed the services described in this documentation, which was scribed in my presence. The recorded information has been reviewed and is accurate.     Roxy Horseman, PA-C 12/18/14 1650  Lorre Nick, MD 12/19/14 2038

## 2015-04-20 ENCOUNTER — Encounter (HOSPITAL_COMMUNITY): Payer: Self-pay | Admitting: *Deleted

## 2015-04-20 ENCOUNTER — Emergency Department (HOSPITAL_COMMUNITY)
Admission: EM | Admit: 2015-04-20 | Discharge: 2015-04-20 | Disposition: A | Discharge status: Home / Self Care | Payer: No Typology Code available for payment source | Attending: Emergency Medicine | Admitting: Emergency Medicine

## 2015-04-20 ENCOUNTER — Emergency Department (HOSPITAL_COMMUNITY): Payer: No Typology Code available for payment source

## 2015-04-20 DIAGNOSIS — Y9389 Activity, other specified: Secondary | ICD-10-CM | POA: Diagnosis not present

## 2015-04-20 DIAGNOSIS — S3992XA Unspecified injury of lower back, initial encounter: Secondary | ICD-10-CM | POA: Diagnosis present

## 2015-04-20 DIAGNOSIS — Y92410 Unspecified street and highway as the place of occurrence of the external cause: Secondary | ICD-10-CM | POA: Diagnosis not present

## 2015-04-20 DIAGNOSIS — Y998 Other external cause status: Secondary | ICD-10-CM | POA: Insufficient documentation

## 2015-04-20 DIAGNOSIS — I2510 Atherosclerotic heart disease of native coronary artery without angina pectoris: Secondary | ICD-10-CM | POA: Insufficient documentation

## 2015-04-20 DIAGNOSIS — I252 Old myocardial infarction: Secondary | ICD-10-CM | POA: Diagnosis not present

## 2015-04-20 DIAGNOSIS — S39012A Strain of muscle, fascia and tendon of lower back, initial encounter: Secondary | ICD-10-CM

## 2015-04-20 DIAGNOSIS — Z880 Allergy status to penicillin: Secondary | ICD-10-CM | POA: Diagnosis not present

## 2015-04-20 DIAGNOSIS — Z720 Tobacco use: Secondary | ICD-10-CM | POA: Diagnosis not present

## 2015-04-20 MED ORDER — HYDROCODONE-ACETAMINOPHEN 5-325 MG PO TABS
1.0000 | ORAL_TABLET | Freq: Once | ORAL | Status: AC
  Administered 2015-04-20: 1 via ORAL
  Filled 2015-04-20: qty 1

## 2015-04-20 MED ORDER — METHOCARBAMOL 500 MG PO TABS: 500.0000 mg | ORAL_TABLET | Freq: Two times a day (BID) | ORAL | Status: DC

## 2015-04-20 MED ORDER — NAPROXEN 500 MG PO TABS: 500.0000 mg | ORAL_TABLET | Freq: Two times a day (BID) | ORAL | Status: DC

## 2015-04-20 MED ORDER — TRAMADOL HCL 50 MG PO TABS: 50.0000 mg | ORAL_TABLET | Freq: Four times a day (QID) | ORAL | Status: DC | PRN
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
## 2015-04-20 NOTE — ED Notes (Signed)
Call no answer 

## 2015-04-20 NOTE — ED Notes (Signed)
Declined W/C at D/C and was escorted to lobby by RN. 
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
## 2015-07-13 ENCOUNTER — Emergency Department (HOSPITAL_COMMUNITY)
Admission: EM | Admit: 2015-07-13 | Discharge: 2015-07-13 | Disposition: A | Discharge status: Home / Self Care | Payer: Self-pay | Attending: Emergency Medicine | Admitting: Emergency Medicine

## 2015-07-13 ENCOUNTER — Encounter (HOSPITAL_COMMUNITY): Payer: Self-pay | Admitting: Emergency Medicine

## 2015-07-13 ENCOUNTER — Emergency Department (HOSPITAL_COMMUNITY): Payer: Self-pay

## 2015-07-13 DIAGNOSIS — F17210 Nicotine dependence, cigarettes, uncomplicated: Secondary | ICD-10-CM | POA: Insufficient documentation

## 2015-07-13 DIAGNOSIS — I252 Old myocardial infarction: Secondary | ICD-10-CM | POA: Insufficient documentation

## 2015-07-13 DIAGNOSIS — Z791 Long term (current) use of non-steroidal anti-inflammatories (NSAID): Secondary | ICD-10-CM | POA: Insufficient documentation

## 2015-07-13 DIAGNOSIS — Z9889 Other specified postprocedural states: Secondary | ICD-10-CM | POA: Insufficient documentation

## 2015-07-13 DIAGNOSIS — Z880 Allergy status to penicillin: Secondary | ICD-10-CM | POA: Insufficient documentation

## 2015-07-13 DIAGNOSIS — R0989 Other specified symptoms and signs involving the circulatory and respiratory systems: Secondary | ICD-10-CM

## 2015-07-13 DIAGNOSIS — I2510 Atherosclerotic heart disease of native coronary artery without angina pectoris: Secondary | ICD-10-CM | POA: Insufficient documentation

## 2015-07-13 DIAGNOSIS — Z79899 Other long term (current) drug therapy: Secondary | ICD-10-CM | POA: Insufficient documentation

## 2015-07-13 DIAGNOSIS — J069 Acute upper respiratory infection, unspecified: Secondary | ICD-10-CM | POA: Insufficient documentation

## 2015-07-13 MED ORDER — BENZONATATE 100 MG PO CAPS: 100.0000 mg | ORAL_CAPSULE | Freq: Three times a day (TID) | ORAL | Status: DC

## 2015-07-13 MED ORDER — PREDNISONE 20 MG PO TABS
40.0000 mg | ORAL_TABLET | Freq: Every day | ORAL | Status: DC
Start: 2015-07-13 — End: 2016-03-07

## 2015-07-13 MED ORDER — IPRATROPIUM-ALBUTEROL 0.5-2.5 (3) MG/3ML IN SOLN
3.0000 mL | Freq: Once | RESPIRATORY_TRACT | Status: AC
  Administered 2015-07-13: 3 mL via RESPIRATORY_TRACT
  Filled 2015-07-13: qty 3

## 2015-07-13 MED ORDER — GUAIFENESIN ER 600 MG PO TB12: 600.0000 mg | ORAL_TABLET | Freq: Two times a day (BID) | ORAL | Status: DC | PRN

## 2015-07-13 MED ORDER — ALBUTEROL SULFATE HFA 108 (90 BASE) MCG/ACT IN AERS: 1.0000 | INHALATION_SPRAY | Freq: Four times a day (QID) | RESPIRATORY_TRACT | Status: DC | PRN

## 2015-07-13 NOTE — ED Notes (Signed)
Pt reports sore throat, productive cough x 1 week. Stated that he coughs up thick yellow secretions. Faint expiratory wheezing noted, bilaterally

## 2015-07-13 NOTE — ED Provider Notes (Signed)
CSN: [id number]     Arrival date & time 07/13/15  1354 History  By signing my name below, I, Doreatha Martin, attest that this documentation has been prepared under the direction and in the presence of Norita Meigs Y Keyon [place], [place]. Electronically Signed: Doreatha Martin, ED Scribe. 07/13/2015. 2:35 PM.   Chief Complaint  Patient presents with  . Sore Throat     1 week hx  . Headache  . Nasal Congestion    1 week hx of sinus pressure  . Cough    productive  cough  . Laryngitis   The history is provided by the patient. No language interpreter was used.    HPI Comments: Sean Martinez is a 55 y.o. male with h/o CAD who presents to the Emergency Department complaining of moderate productive cough with yellow phlegm for a week with associated hoarseness, sore throat, HA, decreased appetite, abdominal pain secondary to coughing. Pt states he has tried Alkaseltzer plus, nyquil, hot tea with no relief. Pt denies having any sick contacts with similar symptoms. He denies emesis, nausea, diarrhea, fever, chills.  Denies chest pain or SOB. Admits to smoking <1/2 PPD.   Past Medical History  Diagnosis Date  . MI, old   . Coronary artery disease    Past Surgical History  Procedure Laterality Date  . Hernia repair    . Left heart catheterization with coronary angiogram N/A 07/17/2012    Procedure: LEFT HEART CATHETERIZATION WITH CORONARY ANGIOGRAM;  Surgeon: Pamella Pert, MD;  [facility] C[facility] LAB;  Service: Cardiovascular;  Laterality: N/A;   Family History  Problem Relation Age of Onset  . Diabetes Mellitus II Mother    Social History  Substance Use Topics  . Smoking status: Current Every Day Smoker -- 0.25 packs/day    Types: Cigarettes  . Smokeless tobacco: None  . Alcohol Use: No     Comment: occ    Review of Systems  Constitutional: Positive for appetite change. Negative for fever and chills.  HENT: Positive for sore throat.   Respiratory: Positive for cough.   Gastrointestinal:  Positive for abdominal pain ( secondary to cough). Negative for nausea, vomiting and diarrhea.  Neurological: Positive for headaches.  All other systems reviewed and are negative.  Allergies  Penicillins  Home Medications   Prior to Admission medications   Medication Sig Start Date End Date Taking? Authorizing Provider  cyclobenzaprine (FLEXERIL) 10 MG tablet Take 1 tablet (10 mg total) by mouth 2 (two) times daily as needed for muscle spasms. 12/18/14   Roxy Horseman, PA-C  ibuprofen (ADVIL,MOTRIN) 800 MG tablet Take 1 tablet (800 mg total) by mouth 3 (three) times daily. 12/18/14   Roxy Horseman, PA-C  methocarbamol (ROBAXIN) 500 MG tablet Take 1 tablet (500 mg total) by mouth 2 (two) times daily. 04/20/15   Tatyana Kirichenko, PA-C  naproxen (NAPROSYN) 500 MG tablet Take 1 tablet (500 mg total) by mouth 2 (two) times daily. 04/20/15   Tatyana Kirichenko, PA-C  oxyCODONE-acetaminophen (PERCOCET) 5-325 MG per tablet Take 1 tablet by mouth every 6 (six) hours as needed for moderate pain. 07/04/14   Purvis Sheffield, MD  traMADol (ULTRAM) 50 MG tablet Take 1 tablet (50 mg total) by mouth every 6 (six) hours as needed. 04/20/15   Tatyana Kirichenko, PA-C   BP 132/83 mmHg  Pulse 88  Temp(Src) 98.6 F (37 C) (Oral)  Resp 20  SpO2 96% Physical Exam  Constitutional: He is oriented to person, place, and time. He appears well-developed  and well-nourished.  HENT:  Head: Normocephalic and atraumatic.  Right Ear: Tympanic membrane and external ear normal.  Left Ear: Tympanic membrane and external ear normal.  Nose: Mucosal edema and rhinorrhea present.  Mild posterior oropharyngeal erythema. No tonsillar swelling or exudate. +cobblestoning.    Eyes: Conjunctivae and EOM are normal. Pupils are equal, round, and reactive to light.  Neck: Normal range of motion. Neck supple.  Cardiovascular: Normal rate, regular rhythm and normal heart sounds.   Pulmonary/Chest: Effort normal. No respiratory  distress. He exhibits no tenderness.  Bilateral end expiratory wheezing in lower lung bases. Left lung with rhonchi in lower lobe that clear with coughing.  Abdominal: Soft. He exhibits no distension. There is no tenderness.  Musculoskeletal: Normal range of motion.  Neurological: He is alert and oriented to person, place, and time.  Skin: Skin is warm and dry.  Psychiatric: He has a normal mood and affect. His behavior is normal.  Nursing note and vitals reviewed.   ED Course  Procedures (including critical care time) DIAGNOSTIC STUDIES: Oxygen Saturation is 96% on RA, adequate by my interpretation.    COORDINATION OF CARE: 2:33 PM Discussed treatment plan with pt at bedside which includes XR and duoneb and pt agreed to plan.  Imaging Review Dg Chest 2 View  07/13/2015  CLINICAL DATA:  Cough congestion shortness of breath laryngitis for 1 week EXAM: CHEST  2 VIEW COMPARISON:  07/04/2014 FINDINGS: The heart size and vascular pattern are normal. There is no consolidation or effusion. There is hyperinflation suggesting COPD. Stable mild bronchitic change. IMPRESSION: Evidence of COPD with no acute findings. Electronically Signed   By: Esperanza Heir M.D.   On: 07/13/2015 15:25   I have personally reviewed and evaluated these images as part of my medical decision-making.  MDM   Final diagnoses:  URI (upper respiratory infection)  Hyperinflation of lungs    Pt initially with soft end expiratory wheezing on my exam. Resolved with duoneb. He otherwise has evidence of viral URI. CXR shows no pneumonia but does show hyperinflation suggestive of COPD. I discussed this with pt. He does not have PCP currently. Resource guide was given to establish PCP as an outpatient. Pt is otherwise not hypoxic, tachycardic, or tachypneic. He is stable for discharge. Rx given for albuterol, prednisone, mucinex, and tessalon. ER return precautions given. Pt verbalized understanding and agreement with plan.    I personally performed the services described in this documentation, which was scribed in my presence. The recorded information has been reviewed and is accurate.   Carlene Coria, PA-C 07/13/15 1539  Benjiman Core, MD 07/13/15 [phone]

## 2015-07-13 NOTE — Discharge Instructions (Signed)
Your chest x-ray showed evidence of possible COPD. Please contact one of the clinics below to establish primary care. They will need to follow up and do further pulmonary (lung) evaluation. In the meantime I gave you prescriptions to help with your symptoms. Return to the ER for new or worsening symptoms.  Emergency Department Resource Guide 1) Find a Doctor and Pay Out of Pocket Although you won't have to find out who is covered by your insurance plan, it is a good idea to ask around and get recommendations. You will then need to call the office and see if the doctor you have chosen will accept you as a new patient and what types of options they offer for patients who are self-pay. Some doctors offer discounts or will set up payment plans for their patients who do not have insurance, but you will need to ask so you aren't surprised when you get to your appointment.  2) Contact Your Local Health Department Not all health departments have doctors that can see patients for sick visits, but many do, so it is worth a call to see if yours does. If you don't know where your local health department is, you can check in your phone book. The CDC also has a tool to help you locate your state's health department, and many state websites also have listings of all of their local health departments.  3) Find a Walk-in Clinic If your illness is not likely to be very severe or complicated, you may want to try a walk in clinic. These are popping up all over the country in pharmacies, drugstores, and shopping centers. They're usually staffed by nurse practitioners or physician assistants that have been trained to treat common illnesses and complaints. They're usually fairly quick and inexpensive. However, if you have serious medical issues or chronic medical problems, these are probably not your best option.  No Primary Care Doctor: - Call Health Connect at  [phone] - they can help you locate a primary care doctor that   accepts your insurance, provides certain services, etc. - Physician Referral [phone]  Emergency Department Resource Guide 1) Find a Doctor and Pay Out of Pocket Although you won't have to find out who is covered by your insurance plan, it is a good idea to ask around and get recommendations. You will then need to call the office and see if the doctor you have chosen will accept you as a new patient and what types of options they offer for patients who are self-pay. Some doctors offer discounts or will set up payment plans for their patients who do not have insurance, but you will need to ask so you aren't surprised when you get to your appointment.  2) Contact Your Local Health Department Not all health departments have doctors that can see patients for sick visits, but many do, so it is worth a call to see if yours does. If you don't know where your local health department is, you can check in your phone book. The CDC also has a tool to help you locate your state's health department, and many state websites also have listings of all of their local health departments.  3) Find a Walk-in Clinic If your illness is not likely to be very severe or complicated, you may want to try a walk in clinic. These are popping up all over the country in pharmacies, drugstores, and shopping centers. They're usually staffed by nurse practitioners or physician assistants that have been  trained to treat common illnesses and complaints. They're usually fairly quick and inexpensive. However, if you have serious medical issues or chronic medical problems, these are probably not your best option.  No Primary Care Doctor: - Call Health Connect at  [phone] - they can help you locate a primary care doctor that  accepts your insurance, provides certain services, etc. - Physician Referral Service- [phone]  Chronic Pain Problems: Organization         Address  Phone   Notes  [facility]  [phone] Patients need to be referred by their primary care doctor.   Medication Assistance: Organization         Address  Phone   Notes  [facility] Medication [facility] [address] [place] [place], [place] [address] [phone] --Must be a resident of [facility] -- Must have NO insurance coverage whatsoever (no Medicaid/ Medicare, etc.) -- The pt. MUST have a primary care doctor that directs their care regularly and follows them in the community   [facility]  [phone]   [employer]  [phone]    Agencies that provide inexpensive medical care: Organization         Address  Phone   Notes  [facility]  [phone]   [facility]    [phone]   [facility] [address] [place], [place] [address] [phone]   [facility] [address] [place], [place] [phone]   [facility]    [phone]   [facility]    [phone]   [facility]  [address], [place] Phone:  [phone], Fax:  [phone] Hours of Operation:  9 am - 6 pm, M-F.  Also accepts Medicaid/Medicare and self-pay.  [facility]  [address], [place] Phone: [phone], Fax: [phone]. Hours of Operation:  8:30 am - 5:30 pm, M-F.  Also accepts Medicaid and self-pay.  [facility] [place] [address] [place] Phone: [phone]   [facility] [address] Natasha Bence [place], [place] [phone], Ext. [phone] Mondays & Thursdays: 7-9 AM.  First 15 patients are seen on a first come, first serve basis.    Medicaid-accepting [facility] Providers:  Organization         Address  Phone   Notes  [facility] [address], [place] [phone] Also accepts self-pay patients.  [facility] [address] [place] Josephs [place],  [place]  [phone]   [facility] [address], [place] [phone]   [facility] [address] [place] [phone]   Renaye Rakers [address], [place]   [phone] Only accepts [place] patients after they have their name applied to their card.   Self-Pay (no insurance) in [facility]:  Organization         Address  Phone   Notes  Sickle Cell Patients, [facility] [address] [place], [place] [phone]   [facility] [address] [place], [place] [phone]   [facility] [place]  [address],  [phone]   [facility]/Dr. Osei-Bonsu  [address] [place] or [place]  [address], [place] [phone] Phone number for both [facility] and [place] locations is the same.  [facility] [address], [place] [phone]   [facility] [address] [place] or [address] [phone]   [facility] [address], [place] [phone], phone; [phone], fax Sees patients 1st and 3rd Saturday of every month.  Must not qualify for public or private insurance (i.e. Medicaid, Medicare, [place] Health Choice, Veterans' Benefits)  Household income should be no more than 200% of the poverty level The clinic cannot treat you if you are pregnant or think you are pregnant  Sexually transmitted diseases are not treated at the clinic.

## 2015-11-26 ENCOUNTER — Encounter (HOSPITAL_COMMUNITY): Payer: Self-pay | Admitting: *Deleted

## 2015-11-26 ENCOUNTER — Emergency Department (HOSPITAL_COMMUNITY)
Admission: EM | Admit: 2015-11-26 | Discharge: 2015-11-26 | Disposition: A | Discharge status: Home / Self Care | Payer: No Typology Code available for payment source | Attending: Emergency Medicine | Admitting: Emergency Medicine

## 2015-11-26 DIAGNOSIS — R200 Anesthesia of skin: Secondary | ICD-10-CM | POA: Insufficient documentation

## 2015-11-26 DIAGNOSIS — I2510 Atherosclerotic heart disease of native coronary artery without angina pectoris: Secondary | ICD-10-CM | POA: Insufficient documentation

## 2015-11-26 DIAGNOSIS — Z4801 Encounter for change or removal of surgical wound dressing: Secondary | ICD-10-CM | POA: Insufficient documentation

## 2015-11-26 DIAGNOSIS — F17210 Nicotine dependence, cigarettes, uncomplicated: Secondary | ICD-10-CM | POA: Insufficient documentation

## 2015-11-26 LAB — CBC
HCT: 42.9 % (ref 39.0–52.0)
Hemoglobin: 15 g/dL (ref 13.0–17.0)
MCH: 31.6 pg (ref 26.0–34.0)
MCHC: 35 g/dL (ref 30.0–36.0)
MCV: 90.3 fL (ref 78.0–100.0)
PLATELETS: 171 10*3/uL (ref 150–400)
RBC: 4.75 MIL/uL (ref 4.22–5.81)
RDW: 14 % (ref 11.5–15.5)
WBC: 4.3 10*3/uL (ref 4.0–10.5)

## 2015-11-26 LAB — COMPREHENSIVE METABOLIC PANEL
ALT: 22 U/L (ref 17–63)
AST: 25 U/L (ref 15–41)
Albumin: 3.8 g/dL (ref 3.5–5.0)
Alkaline Phosphatase: 66 U/L (ref 38–126)
Anion gap: 6 (ref 5–15)
BILIRUBIN TOTAL: 0.3 mg/dL (ref 0.3–1.2)
BUN: 13 mg/dL (ref 6–20)
CO2: 27 mmol/L (ref 22–32)
CREATININE: 0.85 mg/dL (ref 0.61–1.24)
Calcium: 9.2 mg/dL (ref 8.9–10.3)
Chloride: 106 mmol/L (ref 101–111)
GFR calc Af Amer: >60 mL/min (ref >60)
Glucose, Bld: 82 mg/dL (ref 65–99)
Potassium: 4.3 mmol/L (ref 3.5–5.1)
Sodium: 139 mmol/L (ref 135–145)
TOTAL PROTEIN: 6.7 g/dL (ref 6.5–8.1)

## 2015-11-26 NOTE — ED Notes (Signed)
The pt had a laceration to the bottom of his rt foot one month ago  The cut has not healed.  He is also c/o rt and lt thumb index and middle finger numbness for a month also

## 2015-11-26 NOTE — ED Notes (Addendum)
Called Pt for reassessment at 1925, no response

## 2015-12-02 ENCOUNTER — Emergency Department (HOSPITAL_COMMUNITY): Payer: No Typology Code available for payment source

## 2015-12-02 ENCOUNTER — Encounter (HOSPITAL_COMMUNITY): Payer: Self-pay | Admitting: Nurse Practitioner

## 2015-12-02 ENCOUNTER — Emergency Department (HOSPITAL_COMMUNITY)
Admission: EM | Admit: 2015-12-02 | Discharge: 2015-12-02 | Disposition: A | Discharge status: Home / Self Care | Payer: No Typology Code available for payment source | Attending: Emergency Medicine | Admitting: Emergency Medicine

## 2015-12-02 DIAGNOSIS — Y999 Unspecified external cause status: Secondary | ICD-10-CM | POA: Insufficient documentation

## 2015-12-02 DIAGNOSIS — Z791 Long term (current) use of non-steroidal anti-inflammatories (NSAID): Secondary | ICD-10-CM | POA: Insufficient documentation

## 2015-12-02 DIAGNOSIS — Y939 Activity, unspecified: Secondary | ICD-10-CM | POA: Insufficient documentation

## 2015-12-02 DIAGNOSIS — Z79891 Long term (current) use of opiate analgesic: Secondary | ICD-10-CM | POA: Insufficient documentation

## 2015-12-02 DIAGNOSIS — I252 Old myocardial infarction: Secondary | ICD-10-CM | POA: Insufficient documentation

## 2015-12-02 DIAGNOSIS — Z7952 Long term (current) use of systemic steroids: Secondary | ICD-10-CM | POA: Insufficient documentation

## 2015-12-02 DIAGNOSIS — Y929 Unspecified place or not applicable: Secondary | ICD-10-CM | POA: Insufficient documentation

## 2015-12-02 DIAGNOSIS — F17210 Nicotine dependence, cigarettes, uncomplicated: Secondary | ICD-10-CM | POA: Insufficient documentation

## 2015-12-02 DIAGNOSIS — S90852A Superficial foreign body, left foot, initial encounter: Secondary | ICD-10-CM | POA: Insufficient documentation

## 2015-12-02 DIAGNOSIS — Z79899 Other long term (current) drug therapy: Secondary | ICD-10-CM | POA: Insufficient documentation

## 2015-12-02 DIAGNOSIS — W25XXXA Contact with sharp glass, initial encounter: Secondary | ICD-10-CM | POA: Insufficient documentation

## 2015-12-02 DIAGNOSIS — I2510 Atherosclerotic heart disease of native coronary artery without angina pectoris: Secondary | ICD-10-CM | POA: Insufficient documentation

## 2015-12-02 LAB — CBG MONITORING, ED: Glucose-Capillary: 101 mg/dL — ABNORMAL HIGH (ref 65–99)

## 2015-12-02 NOTE — ED Notes (Signed)
Pt verbalized understanding of d/c instructions and has no further questions. Pt stable and NAD. Pt instructed to wear extra padding and supportive shoes.

## 2015-12-02 NOTE — Discharge Instructions (Signed)
Your x-rays today show a small piece of glass just above your heel near the bottom of your Achilles tendon. It is normal for glass to migrate. At this time, there is no indication to remove it because of this can cause more harm. There is a possibility that the glass may eventually migrate out through the skin. Recommend Tylenol or ibuprofen for pain. Wearing padded shoes. Follow-up with Dr. Lajoyce Cornersuda with orthopedics if you have persistent pain. Return to the emergency department if you experience any increase in pain, drainage, redness, fever, or chills.

## 2015-12-02 NOTE — ED Notes (Addendum)
Pt states he got a piece of glass in right foot 1 month ago. States he "pulled it out with tweezers". Continues to "feel something" in right foot. ALSO, states his fingertips have become numb.

## 2015-12-02 NOTE — ED Provider Notes (Signed)
CSN: [id number]     Arrival date & time 12/02/15  1318 History   By signing my name below, I, Marisue HumbleMichelle Chaffee, attest that this documentation has been prepared under the direction and in the presence of non-physician practitioner, Cheri FowlerKayla Trayshawn Durkin, PA-C. Electronically Signed: Marisue HumbleMichelle Chaffee, Scribe. 12/02/2015. 3:38 PM.   Chief Complaint  Patient presents with  . Skin Problem    The history is provided by the patient. No language interpreter was used.   HPI Comments:  Sean Longsaul Anthony Mcclintic is a 55 y.o. male who presents to the Emergency Department complaining of unchanged wound on the bottom of left foot for the past month. Pt reports he stepped on a piece of glass 1 month ago and removed the glass himself. He states it feels like there is still something in his foot now and the wound has not healed. He squeezed the wound once which produced a small amount of purulent drainage; he cleaned the area with alcohol and has not noticed any further drainage or erythema.  He has not taken any pain medication. Last Tetanus last year. Denies fever, chills, gait disturbance, N/V.    Past Medical History  Diagnosis Date  . MI, old   . Coronary artery disease    Past Surgical History  Procedure Laterality Date  . Hernia repair    . Left heart catheterization with coronary angiogram N/A 07/17/2012    Procedure: LEFT HEART CATHETERIZATION WITH CORONARY ANGIOGRAM;  Surgeon: Pamella PertJagadeesh R Ganji, MD;  [facility];  Service: Cardiovascular;  Laterality: N/A;   Family History  Problem Relation Age of Onset  . Diabetes Mellitus II Mother    Social History  Substance Use Topics  . Smoking status: Current Every Day Smoker -- 0.25 packs/day    Types: Cigarettes  . Smokeless tobacco: None  . Alcohol Use: No     Comment: occ    Review of Systems  Constitutional: Negative for fever and chills.  Skin: Positive for wound.    Allergies  Penicillins  Home Medications   Prior to Admission  medications   Medication Sig Start Date End Date Taking? Authorizing Provider  albuterol (PROVENTIL HFA;VENTOLIN HFA) 108 (90 Base) MCG/ACT inhaler Inhale 1-2 puffs into the lungs every 6 (six) hours as needed for wheezing or shortness of breath. 07/13/15   Ace GinsSerena Y [name], PA-C  benzonatate (TESSALON) 100 MG capsule Take 1 capsule (100 mg total) by mouth every 8 (eight) hours. 07/13/15   Ace GinsSerena Y [name], PA-C  cyclobenzaprine (FLEXERIL) 10 MG tablet Take 1 tablet (10 mg total) by mouth 2 (two) times daily as needed for muscle spasms. 12/18/14   Roxy Horsemanobert Browning, PA-C  guaiFENesin (MUCINEX) 600 MG 12 hr tablet Take 1 tablet (600 mg total) by mouth 2 (two) times daily as needed for to loosen phlegm. 07/13/15   Ace GinsSerena Y [name], PA-C  ibuprofen (ADVIL,MOTRIN) 800 MG tablet Take 1 tablet (800 mg total) by mouth 3 (three) times daily. 12/18/14   Roxy Horsemanobert Browning, PA-C  methocarbamol (ROBAXIN) 500 MG tablet Take 1 tablet (500 mg total) by mouth 2 (two) times daily. 04/20/15   Tatyana Kirichenko, PA-C  naproxen (NAPROSYN) 500 MG tablet Take 1 tablet (500 mg total) by mouth 2 (two) times daily. 04/20/15   Tatyana Kirichenko, PA-C  oxyCODONE-acetaminophen (PERCOCET) 5-325 MG per tablet Take 1 tablet by mouth every 6 (six) hours as needed for moderate pain. 07/04/14   Purvis SheffieldForrest Harrison, MD  predniSONE (DELTASONE) 20 MG tablet Take 2 tablets (40 mg  total) by mouth daily. 07/13/15   Ace Gins [name], PA-C  traMADol (ULTRAM) 50 MG tablet Take 1 tablet (50 mg total) by mouth every 6 (six) hours as needed. 04/20/15   Tatyana Kirichenko, PA-C   BP 112/72 mmHg  Pulse 78  Temp(Src) 98 F (36.7 C) (Oral)  Resp 16  SpO2 100%   Physical Exam  Constitutional: He is oriented to person, place, and time. He appears well-developed and well-nourished.  HENT:  Head: Normocephalic and atraumatic.  Right Ear: External ear normal.  Left Ear: External ear normal.  Eyes: Conjunctivae are normal. No scleral icterus.  Neck: No tracheal deviation  present.  Pulmonary/Chest: Effort normal. No respiratory distress.  Abdominal: He exhibits no distension.  Musculoskeletal: Normal range of motion. He exhibits no tenderness.  Neurological: He is alert and oriented to person, place, and time.  Strength and sensation intact bilaterally throughout lower extremities.   Skin: Skin is warm and dry.  Well healing wound to plantar aspect of calcaneus.  No erythema, warmth, induration, fluctuance, or drainage. No signs of infection.   Psychiatric: He has a normal mood and affect. His behavior is normal.    ED Course  Procedures  DIAGNOSTIC STUDIES:  Oxygen Saturation is 97% on RA, normal by my interpretation.    COORDINATION OF CARE:  3:33 PM Will numb the area and remove glass. Discussed treatment plan with pt at bedside and pt agreed to plan.  Labs Review Labs Reviewed  CBG MONITORING, ED - Abnormal; Notable for the following:    Glucose-Capillary 101 (*)    All other components within normal limits    Imaging Review Dg Foot Complete Right  12/02/2015  CLINICAL DATA:  Stepped on glass 1 month ago. Rule out foreign body plantar surface. EXAM: RIGHT FOOT COMPLETE - 3+ VIEW COMPARISON:  none FINDINGS: Negative for fracture or arthropathy. 1 x 2 mm foreign body in the soft tissues posterior to the calcaneus near the Achilles tendon. This could represent a glass foreign body. Soft tissue calcification also possible. IMPRESSION: 1 x 2 mm soft tissue density posterior to the calcaneus could represent a foreign body Negative for fracture. Electronically Signed   By: Marlan Palau M.D.   On: 12/02/2015 15:19   I have personally reviewed and evaluated these images and lab results as part of my medical decision-making.   EKG Interpretation None      MDM   Final diagnoses:  Foreign body of skin of plantar aspect of foot, left, initial encounter   Patient presents for FB removal.  VSS, NAD.  No signs of infection.  Neurovascularly intact.   Plain films show 1x2 mm soft tissue density posterior to calcaneus.  No indication for FB removal at this time as it would cause more trauma.  Advised patient it may migrate out or stay.  Discussed return precautions.  Follow up with orthopedics for further evaluation if symptoms persist.  Patient agrees and acknowledges the above plan for discharge.   I personally performed the services described in this documentation, which was scribed in my presence. The recorded information has been reviewed and is accurate.    Cheri Fowler, PA-C 12/02/15 1640  Benjiman Core, MD 12/02/15 [phone]

## 2015-12-02 NOTE — ED Notes (Signed)
Pt c/o sore to bottom of L foot x 1 month where he stepped on a piece of glass. He thought he had removed all of the glass but he feels like there is still something in his foot now and the sore will not heal. He also reports 3 week history of constant tingling sensation in first 3 fingers of each hand. He is alert and breathing easily

## 2016-03-07 ENCOUNTER — Emergency Department (HOSPITAL_COMMUNITY): Payer: Self-pay

## 2016-03-07 ENCOUNTER — Encounter (HOSPITAL_COMMUNITY): Payer: Self-pay

## 2016-03-07 ENCOUNTER — Emergency Department (HOSPITAL_COMMUNITY)
Admission: EM | Admit: 2016-03-07 | Discharge: 2016-03-07 | Disposition: A | Discharge status: Home / Self Care | Payer: Self-pay | Attending: Emergency Medicine | Admitting: Emergency Medicine

## 2016-03-07 DIAGNOSIS — F17210 Nicotine dependence, cigarettes, uncomplicated: Secondary | ICD-10-CM | POA: Insufficient documentation

## 2016-03-07 DIAGNOSIS — I252 Old myocardial infarction: Secondary | ICD-10-CM | POA: Insufficient documentation

## 2016-03-07 DIAGNOSIS — J40 Bronchitis, not specified as acute or chronic: Secondary | ICD-10-CM | POA: Insufficient documentation

## 2016-03-07 DIAGNOSIS — I2510 Atherosclerotic heart disease of native coronary artery without angina pectoris: Secondary | ICD-10-CM | POA: Insufficient documentation

## 2016-03-07 LAB — CBC
HEMATOCRIT: 46.4 % (ref 39.0–52.0)
Hemoglobin: 16.2 g/dL (ref 13.0–17.0)
MCH: 32.7 pg (ref 26.0–34.0)
MCHC: 34.9 g/dL (ref 30.0–36.0)
MCV: 93.7 fL (ref 78.0–100.0)
PLATELETS: 169 10*3/uL (ref 150–400)
RBC: 4.95 MIL/uL (ref 4.22–5.81)
RDW: 13.9 % (ref 11.5–15.5)
WBC: 4.9 10*3/uL (ref 4.0–10.5)

## 2016-03-07 LAB — BASIC METABOLIC PANEL
Anion gap: 8 (ref 5–15)
BUN: 12 mg/dL (ref 6–20)
CALCIUM: 8.9 mg/dL (ref 8.9–10.3)
CO2: 21 mmol/L — ABNORMAL LOW (ref 22–32)
CREATININE: 0.81 mg/dL (ref 0.61–1.24)
Chloride: 107 mmol/L (ref 101–111)
GFR calc Af Amer: >60 mL/min (ref >60)
GLUCOSE: 133 mg/dL — AB (ref 65–99)
Potassium: 3.9 mmol/L (ref 3.5–5.1)
SODIUM: 136 mmol/L (ref 135–145)

## 2016-03-07 LAB — I-STAT TROPONIN, ED: Troponin i, poc: 0 ng/mL (ref 0.00–0.08)

## 2016-03-07 MED ORDER — BENZONATATE 100 MG PO CAPS: 100.0000 mg | ORAL_CAPSULE | Freq: Three times a day (TID) | ORAL | 0 refills | Status: DC

## 2016-03-07 MED ORDER — ONDANSETRON 4 MG PO TBDP: 4.0000 mg | ORAL_TABLET | Freq: Three times a day (TID) | ORAL | 0 refills | Status: DC | PRN

## 2016-03-07 MED ORDER — ALBUTEROL SULFATE HFA 108 (90 BASE) MCG/ACT IN AERS: 2.0000 | INHALATION_SPRAY | RESPIRATORY_TRACT | 0 refills | Status: DC | PRN

## 2016-03-07 MED ORDER — PREDNISONE 10 MG PO TABS
20.0000 mg | ORAL_TABLET | Freq: Two times a day (BID) | ORAL | 0 refills | Status: AC
Start: 2016-03-07 — End: 2016-03-12

## 2016-03-07 MED ORDER — NAPROXEN 500 MG PO TABS: 500.0000 mg | ORAL_TABLET | Freq: Two times a day (BID) | ORAL | 0 refills | Status: DC

## 2016-03-07 NOTE — Discharge Instructions (Signed)
Your symptoms are consistent with a viral illness. Viruses do not require antibiotics. Treatment is symptomatic care. Drink plenty of fluids and get plenty of rest. You should be drinking at least a one half to one liter of water an hour to stay hydrated. Ibuprofen, Naproxen, or Tylenol for pain or fever. Zofran for nausea. Tessalon for cough. Plain Mucinex may help relieve congestion. He uses albuterol as needed for minor shortness of breath. Follow-up with a PCP for continued symptoms. Return to the ED should symptoms worsen.

## 2016-03-07 NOTE — ED Triage Notes (Signed)
Per Pt, Pt is coming from home with complaints of cough and cold symptoms for the past four days. Reports clear sputum from cough. Unknown with fevers. Pt reports having tingling in hands bilaterally for two months.

## 2016-03-07 NOTE — ED Provider Notes (Signed)
[facility]EM[facility] DEPT Provider Note   CSN: [id number] Arrival date & time: 03/07/16  4098     History   Chief Complaint Chief Complaint  Patient presents with  . Cough  . Numbness  . Chest Pain    HPI Sean Martinez is a 55 y.o. male.  HPI    Sean Martinez is a 55 y.o. male, with a history of CAD, presenting to the ED with nonproductive cough, congestion, rhinorrhea, and fatigue for the last 4 days. Patient also endorses some chest wall pain that arose after coughing began and exacerbated by coughing and deep breathing. Patient has not tried any medications for his symptoms. Denies current chest pain, nausea/vomiting, fever/chills, shortness of breath, or any other complaints.   Past Medical History:  Diagnosis Date  . Coronary artery disease   . MI, old     Patient Active Problem List   Diagnosis Date Noted  . Chest wall pain 07/17/2012  . Hypotension, unspecified 05/31/2012  . Chest pain 05/30/2012  . Right groin pain 05/30/2012  . Diarrhea 05/30/2012  . Tobacco abuse 05/30/2012    Past Surgical History:  Procedure Laterality Date  . HERNIA REPAIR    . LEFT HEART CATHETERIZATION WITH CORONARY ANGIOGRAM N/A 07/17/2012   Procedure: LEFT HEART CATHETERIZATION WITH CORONARY ANGIOGRAM;  Surgeon: Pamella Pert, MD;  Location: [facility] CATH LAB;  Service: Cardiovascular;  Laterality: N/A;       Home Medications    Prior to Admission medications   Medication Sig Start Date End Date Taking? Authorizing Provider  albuterol (PROVENTIL HFA;VENTOLIN HFA) 108 (90 Base) MCG/ACT inhaler Inhale 1-2 puffs into the lungs every 6 (six) hours as needed for wheezing or shortness of breath. 07/13/15   Ace Gins [name], PA-C  albuterol (PROVENTIL HFA;VENTOLIN HFA) 108 (90 Base) MCG/ACT inhaler Inhale 2 puffs into the lungs every 4 (four) hours as needed for wheezing or shortness of breath. 03/07/16   Io Dieujuste C Florette Thai, PA-C  benzonatate (TESSALON) 100 MG capsule Take 1 capsule (100 mg  total) by mouth every 8 (eight) hours. 03/07/16   Shoni Quijas C Archie Shea, PA-C  cyclobenzaprine (FLEXERIL) 10 MG tablet Take 1 tablet (10 mg total) by mouth 2 (two) times daily as needed for muscle spasms. 12/18/14   Roxy Horseman, PA-C  guaiFENesin (MUCINEX) 600 MG 12 hr tablet Take 1 tablet (600 mg total) by mouth 2 (two) times daily as needed for to loosen phlegm. 07/13/15   Ace Gins [name], PA-C  ibuprofen (ADVIL,MOTRIN) 800 MG tablet Take 1 tablet (800 mg total) by mouth 3 (three) times daily. 12/18/14   Roxy Horseman, PA-C  methocarbamol (ROBAXIN) 500 MG tablet Take 1 tablet (500 mg total) by mouth 2 (two) times daily. 04/20/15   Tatyana Kirichenko, PA-C  naproxen (NAPROSYN) 500 MG tablet Take 1 tablet (500 mg total) by mouth 2 (two) times daily. 04/20/15   Tatyana Kirichenko, PA-C  naproxen (NAPROSYN) 500 MG tablet Take 1 tablet (500 mg total) by mouth 2 (two) times daily. 03/07/16   Paulene Tayag C Cristhian Vanhook, PA-C  ondansetron (ZOFRAN ODT) 4 MG disintegrating tablet Take 1 tablet (4 mg total) by mouth every 8 (eight) hours as needed for nausea or vomiting. 03/07/16   Josemanuel Eakins C Norita Meigs, PA-C  oxyCODONE-acetaminophen (PERCOCET) 5-325 MG per tablet Take 1 tablet by mouth every 6 (six) hours as needed for moderate pain. 07/04/14   Purvis Sheffield, MD  predniSONE (DELTASONE) 10 MG tablet Take 2 tablets (20 mg total) by mouth 2 (two) times daily  with a meal. 03/07/16 03/12/16  Francetta Ilg C Dai Apel, PA-C  traMADol (ULTRAM) 50 MG tablet Take 1 tablet (50 mg total) by mouth every 6 (six) hours as needed. 04/20/15   Jaynie Crumble, PA-C    Family History Family History  Problem Relation Age of Onset  . Diabetes Mellitus II Mother     Social History Social History  Substance Use Topics  . Smoking status: Current Every Day Smoker    Packs/day: 0.25    Types: Cigarettes  . Smokeless tobacco: Never Used  . Alcohol use No     Comment: occ     Allergies   Penicillins   Review of Systems Review of Systems  Constitutional: Negative  for chills, diaphoresis and fever.  Respiratory: Positive for cough. Negative for shortness of breath.   Gastrointestinal: Negative for abdominal pain, nausea and vomiting.  All other systems reviewed and are negative.    Physical Exam Updated Vital Signs BP 125/70 (BP Location: Right Arm)   Pulse 79   Temp 98.4 F (36.9 C) (Oral)   Resp 20   Ht 6' (1.829 m)   Wt 84.8 kg   SpO2 97%   BMI 25.36 kg/m   Physical Exam  Constitutional: He appears well-developed and well-nourished. No distress.  HENT:  Head: Normocephalic and atraumatic.  Eyes: Conjunctivae are normal.  Neck: Neck supple.  Cardiovascular: Normal rate, regular rhythm, normal heart sounds and intact distal pulses.   Pulmonary/Chest: Effort normal and breath sounds normal. No respiratory distress. He exhibits tenderness.  Abdominal: Soft. There is no tenderness. There is no guarding.  Musculoskeletal: He exhibits no edema or tenderness.  Lymphadenopathy:    He has no cervical adenopathy.  Neurological: He is alert.  Skin: Skin is warm and dry. He is not diaphoretic.  Psychiatric: He has a normal mood and affect. His behavior is normal.  Nursing note and vitals reviewed.    ED Treatments / Results  Labs (all labs ordered are listed, but only abnormal results are displayed) Labs Reviewed  BASIC METABOLIC PANEL - Abnormal; Notable for the following:       Result Value   CO2 21 (*)    Glucose, Bld 133 (*)    All other components within normal limits  CBC  I-STAT TROPOININ, ED    EKG  EKG Interpretation None       Radiology Dg Chest 2 View  Result Date: 03/07/2016 CLINICAL DATA:  Chest pain, productive cough, headache, nausea, and vomiting for past 4 days, tingling sensation in fingers for 2 months, asthma, smoker EXAM: CHEST  2 VIEW COMPARISON:  07/13/2015 FINDINGS: Normal heart size, mediastinal contours, and pulmonary vascularity. Mild bronchitic changes. Lungs clear. No pulmonary infiltrate,  pleural effusion or pneumothorax. Bones appear demineralized. IMPRESSION: Bronchitic changes without infiltrate. Electronically Signed   By: Ulyses Southward M.D.   On: 03/07/2016 10:02    Procedures Procedures (including critical care time)  Medications Ordered in ED Medications - No data to display   Initial Impression / Assessment and Plan / ED Course  I have reviewed the triage vital signs and the nursing notes.  Pertinent labs & imaging results that were available during my care of the patient were reviewed by me and considered in my medical decision making (see chart for details).  Clinical Course    Patient presents with nonproductive cough, rhinorrhea, congestion, and fatigue for the past 4 days.  Findings and plan of care discussed with [facility].   Patient's presentation is  consistent with a URI versus bronchitis. Likely viral. Bronchitis changes on chest x-ray. Patient is nontoxic appearing, afebrile, not tachycardic, not tachypneic, not hypotensive, maintains adequate SPO2 on room air, and is in no apparent distress. Patient has no signs of sepsis or other serious or life-threatening condition.  Vitals:   03/07/16 0908 03/07/16 0909 03/07/16 1046  BP: 108/84  125/70  Pulse: 93  79  Resp: 18  20  Temp: 98.4 F (36.9 C)    TempSrc: Oral    SpO2: 94%  97%  Weight:  84.8 kg   Height:  6' (1.829 m)     Final Clinical Impressions(s) / ED Diagnoses   Final diagnoses:  Bronchitis    New Prescriptions Discharge Medication List as of 03/07/2016 11:28 AM    START taking these medications   Details  !! albuterol (PROVENTIL HFA;VENTOLIN HFA) 108 (90 Base) MCG/ACT inhaler Inhale 2 puffs into the lungs every 4 (four) hours as needed for wheezing or shortness of breath., Starting Sun 03/07/2016, Print    !! naproxen (NAPROSYN) 500 MG tablet Take 1 tablet (500 mg total) by mouth 2 (two) times daily., Starting Sun 03/07/2016, Print    ondansetron (ZOFRAN ODT) 4 MG  disintegrating tablet Take 1 tablet (4 mg total) by mouth every 8 (eight) hours as needed for nausea or vomiting., Starting Sun 03/07/2016, Print     !! - Potential duplicate medications found. Please discuss with provider.       Anselm PancoastShawn C Tatiyanna Lashley, PA-C 03/07/16 1729    Canary Brimhristopher J Tegeler, MD 03/07/16 2138

## 2016-10-04 ENCOUNTER — Encounter (HOSPITAL_COMMUNITY): Payer: Self-pay | Admitting: Emergency Medicine

## 2016-10-04 ENCOUNTER — Emergency Department (HOSPITAL_COMMUNITY)
Admission: EM | Admit: 2016-10-04 | Discharge: 2016-10-04 | Disposition: A | Discharge status: Home / Self Care | Payer: Self-pay | Attending: Emergency Medicine | Admitting: Emergency Medicine

## 2016-10-04 DIAGNOSIS — F17200 Nicotine dependence, unspecified, uncomplicated: Secondary | ICD-10-CM

## 2016-10-04 DIAGNOSIS — I252 Old myocardial infarction: Secondary | ICD-10-CM | POA: Insufficient documentation

## 2016-10-04 DIAGNOSIS — I2510 Atherosclerotic heart disease of native coronary artery without angina pectoris: Secondary | ICD-10-CM | POA: Insufficient documentation

## 2016-10-04 DIAGNOSIS — Z79899 Other long term (current) drug therapy: Secondary | ICD-10-CM | POA: Insufficient documentation

## 2016-10-04 DIAGNOSIS — J029 Acute pharyngitis, unspecified: Secondary | ICD-10-CM | POA: Insufficient documentation

## 2016-10-04 DIAGNOSIS — F17210 Nicotine dependence, cigarettes, uncomplicated: Secondary | ICD-10-CM | POA: Insufficient documentation

## 2016-10-04 LAB — RAPID STREP SCREEN (MED CTR MEBANE ONLY): Streptococcus, Group A Screen (Direct): NEGATIVE

## 2016-10-04 NOTE — ED Provider Notes (Signed)
[facility]-EMERG[facility]Y DEPT Provider Note   CSN: [id number] Arrival date & time: 10/04/16  1052  By signing my name below, I, Modena Jansky, attest that this documentation has been prepared under the direction and in the presence of non-physician practitioner, Eyvonne Mechanic, PA-C. Electronically Signed: Modena Jansky, Scribe. 10/04/2016. 11:40 AM.  History   Chief Complaint Chief Complaint  Patient presents with  . Sore Throat   The history is provided by the patient. No language interpreter was used.   HPI Comments: Sean Martinez is a 56 y.o. male who presents to the Emergency Department complaining of constant moderate sore throat that started 4 days ago. He has been using throat spray and drinking hot liquids with some relief. His pain is exacerbated by swallowing. He reports associated diarrhea (suspected from recent  liquid-based diet). He admits to a hx of smoking.  Denies any fever, cough, inability to swallow, rhinorrhea, or other complaints at this time.  Past Medical History:  Diagnosis Date  . Coronary artery disease   . MI, old     Patient Active Problem List   Diagnosis Date Noted  . Chest wall pain 07/17/2012  . Hypotension, unspecified 05/31/2012  . Chest pain 05/30/2012  . Right groin pain 05/30/2012  . Diarrhea 05/30/2012  . Tobacco abuse 05/30/2012    Past Surgical History:  Procedure Laterality Date  . HERNIA REPAIR    . LEFT HEART CATHETERIZATION WITH CORONARY ANGIOGRAM N/A 07/17/2012   Procedure: LEFT HEART CATHETERIZATION WITH CORONARY ANGIOGRAM;  Surgeon: Pamella Pert, MD;  Location: [facility] CATH LAB;  Service: Cardiovascular;  Laterality: N/A;       Home Medications    Prior to Admission medications   Medication Sig Start Date End Date Taking? Authorizing Provider  albuterol (PROVENTIL HFA;VENTOLIN HFA) 108 (90 Base) MCG/ACT inhaler Inhale 1-2 puffs into the lungs every 6 (six) hours as needed for wheezing or shortness of breath. 07/13/15   Ace Gins [name], PA-C  albuterol (PROVENTIL HFA;VENTOLIN HFA) 108 (90 Base) MCG/ACT inhaler Inhale 2 puffs into the lungs every 4 (four) hours as needed for wheezing or shortness of breath. 03/07/16   Shawn C Joy, PA-C  benzonatate (TESSALON) 100 MG capsule Take 1 capsule (100 mg total) by mouth every 8 (eight) hours. 03/07/16   Shawn C Joy, PA-C  cyclobenzaprine (FLEXERIL) 10 MG tablet Take 1 tablet (10 mg total) by mouth 2 (two) times daily as needed for muscle spasms. 12/18/14   Roxy Horseman, PA-C  guaiFENesin (MUCINEX) 600 MG 12 hr tablet Take 1 tablet (600 mg total) by mouth 2 (two) times daily as needed for to loosen phlegm. 07/13/15   Ace Gins [name], PA-C  ibuprofen (ADVIL,MOTRIN) 800 MG tablet Take 1 tablet (800 mg total) by mouth 3 (three) times daily. 12/18/14   Roxy Horseman, PA-C  methocarbamol (ROBAXIN) 500 MG tablet Take 1 tablet (500 mg total) by mouth 2 (two) times daily. 04/20/15   Tatyana Kirichenko, PA-C  naproxen (NAPROSYN) 500 MG tablet Take 1 tablet (500 mg total) by mouth 2 (two) times daily. 04/20/15   Tatyana Kirichenko, PA-C  naproxen (NAPROSYN) 500 MG tablet Take 1 tablet (500 mg total) by mouth 2 (two) times daily. 03/07/16   Shawn C Joy, PA-C  ondansetron (ZOFRAN ODT) 4 MG disintegrating tablet Take 1 tablet (4 mg total) by mouth every 8 (eight) hours as needed for nausea or vomiting. 03/07/16   Shawn C Joy, PA-C  oxyCODONE-acetaminophen (PERCOCET) 5-325 MG per tablet Take 1 tablet by  mouth every 6 (six) hours as needed for moderate pain. 07/04/14   Purvis Sheffield, MD  traMADol (ULTRAM) 50 MG tablet Take 1 tablet (50 mg total) by mouth every 6 (six) hours as needed. 04/20/15   Jaynie Crumble, PA-C    Family History Family History  Problem Relation Age of Onset  . Diabetes Mellitus II Mother     Social History Social History  Substance Use Topics  . Smoking status: Current Every Day Smoker    Packs/day: 0.25    Types: Cigarettes  . Smokeless tobacco: Never Used  .  Alcohol use No     Comment: occ     Allergies   Penicillins   Review of Systems Review of Systems  Constitutional: Negative for fever.  HENT: Positive for sore throat. Negative for rhinorrhea and trouble swallowing.   Respiratory: Negative for cough.   Gastrointestinal: Positive for diarrhea (from diet).     Physical Exam Updated Vital Signs BP (!) 121/93 (BP Location: Right Arm)   Pulse 77   Temp 98.1 F (36.7 C) (Oral)   Resp 16   SpO2 99%   Physical Exam  Constitutional: He appears well-developed and well-nourished. No distress.  HENT:  Head: Normocephalic and atraumatic.  Minor erythema to the posterior oropharynx. No tonsillar swelling or exudates. No abscess. No pooling of secretions. Uvulas midline and rises with phonation.  Neck is supple and has full ROM.   Eyes: Conjunctivae are normal.  Neck: Neck supple.  Cardiovascular: Normal rate.   Pulmonary/Chest: Effort normal.  Abdominal: Soft.  Musculoskeletal: Normal range of motion.  Neurological: He is alert.  Skin: Skin is warm and dry.  Psychiatric: He has a normal mood and affect.  Nursing note and vitals reviewed.    ED Treatments / Results  DIAGNOSTIC STUDIES: Oxygen Saturation is 99% on RA, normal by my interpretation.    COORDINATION OF CARE: 11:44 AM- Pt advised of plan for treatment and pt agrees.  Labs (all labs ordered are listed, but only abnormal results are displayed) Labs Reviewed  RAPID STREP SCREEN (NOT AT [facility])  CULTURE, GROUP A STREP [facility])    EKG  EKG Interpretation None       Radiology No results found.  Procedures Procedures (including critical care time)  Medications Ordered in ED Medications - No data to display   Initial Impression / Assessment and Plan / ED Course  I have reviewed the triage vital signs and the nursing notes.  Pertinent labs & imaging results that were available during my care of the patient were reviewed by me and considered in my medical  decision making (see chart for details).      Final Clinical Impressions(s) / ED Diagnoses   Final diagnoses:  Pharyngitis, unspecified etiology  Smoker    56 year old male presents today with complaints of sore throat.  He has very minor irritation noted, no signs of bacterial infection.  He is well-appearing with no signs of abscess.  Patient instructed to continue symptomatic care, follow-up with primary care if symptoms persist return to emergency room if they worsen.  Strict return precautions given.  Pt counseled on smoking cessation.  Patient has a plan that he is going to quit without intervention.  He has an end date of April 15, I discussed options to help facilitate his quitting.   New Prescriptions Discharge Medication List as of 10/04/2016 12:02 PM     I personally performed the services described in this documentation, which was scribed in my  presence. The recorded information has been reviewed and is accurate.   Eyvonne Mechanic, PA-C 10/04/16 1443    Arby Barrette, MD 10/05/16 [phone]

## 2016-10-04 NOTE — ED Triage Notes (Signed)
Pt has been having sore throat since Thursday. Pt has red swollen tonsils.

## 2016-10-04 NOTE — Discharge Instructions (Signed)
Please read attached information. If you experience any new or worsening signs or symptoms please return to the emergency room for evaluation. Please follow-up with your primary care provider or specialist as discussed.  °

## 2016-10-06 LAB — CULTURE, GROUP A STREP (THRC)

## 2018-02-06 ENCOUNTER — Emergency Department (HOSPITAL_COMMUNITY): Payer: Self-pay

## 2018-02-06 ENCOUNTER — Encounter (HOSPITAL_COMMUNITY): Payer: Self-pay | Admitting: Emergency Medicine

## 2018-02-06 ENCOUNTER — Other Ambulatory Visit: Payer: Self-pay

## 2018-02-06 ENCOUNTER — Emergency Department (HOSPITAL_COMMUNITY)
Admission: EM | Admit: 2018-02-06 | Discharge: 2018-02-06 | Disposition: A | Discharge status: Home / Self Care | Payer: Self-pay | Attending: Emergency Medicine | Admitting: Emergency Medicine

## 2018-02-06 DIAGNOSIS — J40 Bronchitis, not specified as acute or chronic: Secondary | ICD-10-CM | POA: Insufficient documentation

## 2018-02-06 DIAGNOSIS — R0602 Shortness of breath: Secondary | ICD-10-CM | POA: Insufficient documentation

## 2018-02-06 DIAGNOSIS — I2510 Atherosclerotic heart disease of native coronary artery without angina pectoris: Secondary | ICD-10-CM | POA: Insufficient documentation

## 2018-02-06 DIAGNOSIS — R059 Cough, unspecified: Secondary | ICD-10-CM

## 2018-02-06 DIAGNOSIS — R05 Cough: Secondary | ICD-10-CM

## 2018-02-06 DIAGNOSIS — I10 Essential (primary) hypertension: Secondary | ICD-10-CM | POA: Insufficient documentation

## 2018-02-06 DIAGNOSIS — F17210 Nicotine dependence, cigarettes, uncomplicated: Secondary | ICD-10-CM | POA: Insufficient documentation

## 2018-02-06 DIAGNOSIS — R112 Nausea with vomiting, unspecified: Secondary | ICD-10-CM | POA: Insufficient documentation

## 2018-02-06 DIAGNOSIS — R1011 Right upper quadrant pain: Secondary | ICD-10-CM | POA: Insufficient documentation

## 2018-02-06 DIAGNOSIS — I252 Old myocardial infarction: Secondary | ICD-10-CM | POA: Insufficient documentation

## 2018-02-06 DIAGNOSIS — R748 Abnormal levels of other serum enzymes: Secondary | ICD-10-CM | POA: Insufficient documentation

## 2018-02-06 DIAGNOSIS — Z79899 Other long term (current) drug therapy: Secondary | ICD-10-CM | POA: Insufficient documentation

## 2018-02-06 LAB — CBC
HEMATOCRIT: 50.4 % (ref 39.0–52.0)
HEMOGLOBIN: 17.2 g/dL — AB (ref 13.0–17.0)
MCH: 32.1 pg (ref 26.0–34.0)
MCHC: 34.1 g/dL (ref 30.0–36.0)
MCV: 94 fL (ref 78.0–100.0)
Platelets: 183 10*3/uL (ref 150–400)
RBC: 5.36 MIL/uL (ref 4.22–5.81)
RDW: 13.2 % (ref 11.5–15.5)
WBC: 7 10*3/uL (ref 4.0–10.5)

## 2018-02-06 LAB — COMPREHENSIVE METABOLIC PANEL
ALK PHOS: 70 U/L (ref 38–126)
ALT: 19 U/L (ref 0–44)
ANION GAP: 9 (ref 5–15)
AST: 22 U/L (ref 15–41)
Albumin: 3.8 g/dL (ref 3.5–5.0)
BUN: 13 mg/dL (ref 6–20)
CALCIUM: 9.3 mg/dL (ref 8.9–10.3)
CO2: 26 mmol/L (ref 22–32)
Chloride: 104 mmol/L (ref 98–111)
Creatinine, Ser: 0.97 mg/dL (ref 0.61–1.24)
GFR calc non Af Amer: >60 mL/min (ref >60)
Glucose, Bld: 137 mg/dL — ABNORMAL HIGH (ref 70–99)
POTASSIUM: 4.2 mmol/L (ref 3.5–5.1)
SODIUM: 139 mmol/L (ref 135–145)
Total Bilirubin: 0.7 mg/dL (ref 0.3–1.2)
Total Protein: 7 g/dL (ref 6.5–8.1)

## 2018-02-06 LAB — URINALYSIS, ROUTINE W REFLEX MICROSCOPIC
Bacteria, UA: NONE SEEN
Bilirubin Urine: NEGATIVE
GLUCOSE, UA: NEGATIVE mg/dL
HGB URINE DIPSTICK: NEGATIVE
KETONES UR: 5 mg/dL — AB
Leukocytes, UA: NEGATIVE
NITRITE: NEGATIVE
PROTEIN: NEGATIVE mg/dL
Specific Gravity, Urine: 1.021 (ref 1.005–1.030)
pH: 5 (ref 5.0–8.0)

## 2018-02-06 LAB — I-STAT TROPONIN, ED: Troponin i, poc: 0 ng/mL (ref 0.00–0.08)

## 2018-02-06 LAB — LIPASE, BLOOD: LIPASE: 81 U/L — AB (ref 11–51)

## 2018-02-06 MED ORDER — PREDNISONE 10 MG PO TABS: 40.0000 mg | ORAL_TABLET | Freq: Every day | ORAL | 0 refills | Status: AC

## 2018-02-06 MED ORDER — DICYCLOMINE HCL 20 MG PO TABS: 20.0000 mg | ORAL_TABLET | Freq: Two times a day (BID) | ORAL | 0 refills | Status: DC

## 2018-02-06 MED ORDER — ONDANSETRON HCL 4 MG/2ML IJ SOLN
4.0000 mg | Freq: Once | INTRAMUSCULAR | Status: AC
  Administered 2018-02-06: 4 mg via INTRAVENOUS
  Filled 2018-02-06: qty 2

## 2018-02-06 MED ORDER — METHYLPREDNISOLONE SODIUM SUCC 125 MG IJ SOLR
125.0000 mg | Freq: Once | INTRAMUSCULAR | Status: AC
  Administered 2018-02-06: 125 mg via INTRAVENOUS
  Filled 2018-02-06: qty 2

## 2018-02-06 MED ORDER — ONDANSETRON HCL 4 MG PO TABS: 4.0000 mg | ORAL_TABLET | Freq: Three times a day (TID) | ORAL | 0 refills | Status: DC | PRN

## 2018-02-06 MED ORDER — IPRATROPIUM-ALBUTEROL 0.5-2.5 (3) MG/3ML IN SOLN
3.0000 mL | Freq: Once | RESPIRATORY_TRACT | Status: AC
  Administered 2018-02-06: 3 mL via RESPIRATORY_TRACT
  Filled 2018-02-06: qty 3

## 2018-02-06 MED ORDER — ALBUTEROL SULFATE HFA 108 (90 BASE) MCG/ACT IN AERS
2.0000 | INHALATION_SPRAY | Freq: Once | RESPIRATORY_TRACT | Status: AC
  Administered 2018-02-06: 2 via RESPIRATORY_TRACT
  Filled 2018-02-06: qty 6.7

## 2018-02-06 MED ORDER — IBUPROFEN 400 MG PO TABS
400.0000 mg | ORAL_TABLET | Freq: Once | ORAL | Status: AC | PRN
  Administered 2018-02-06: 400 mg via ORAL
  Filled 2018-02-06: qty 1

## 2018-02-06 MED ORDER — MORPHINE SULFATE (PF) 4 MG/ML IV SOLN
4.0000 mg | Freq: Once | INTRAVENOUS | Status: AC
  Administered 2018-02-06: 4 mg via INTRAVENOUS
  Filled 2018-02-06: qty 1

## 2018-02-06 MED ORDER — IPRATROPIUM-ALBUTEROL 0.5-2.5 (3) MG/3ML IN SOLN
3.0000 mL | Freq: Once | RESPIRATORY_TRACT | Status: DC
  Filled 2018-02-06: qty 3

## 2018-02-06 MED ORDER — SODIUM CHLORIDE 0.9 % IV BOLUS
1000.0000 mL | Freq: Once | INTRAVENOUS | Status: AC
  Administered 2018-02-06: 1000 mL via INTRAVENOUS

## 2018-02-06 NOTE — ED Provider Notes (Signed)
[facility] EMERGENCY DEPARTMENT Provider Note   CSN: [id number] Arrival date & time: 02/06/18  4098     History   Chief Complaint Chief Complaint  Patient presents with  . Nausea  . Emesis    HPI Sean Martinez is a 57 y.o. male with a history of CAD with prior MI (followed by Dr. Dorris Singh), tobacco abuse who presents emergency department today for chest pain, shortness of breath, cough, abdominal pain, nausea/vomiting.  Patient reports that on Saturday evening he ate a large dinner and shortly after started having epigastric and right upper quadrant abdominal pain without radiation.  He reports associated nausea that is been constant since onset.  He reports approximately 4-5 episodes of nonbilious, nonbloody emesis daily since the start of his pain.  He has been trying over-the-counter medications for his symptoms without any relief.  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  No previous abdominal surgeries.  He reports normal bowel movement today.  No melena or hematochezia.  No history of gallstones or pancreatitis in the past.  Patient also reports that on Sunday he started having shortness of breath at rest and also with exertion.  He notes associated dry cough without sputum production or hemoptysis.  He reports chest pain on coughing but not at rest or with exertion.  He denies any pleuritic chest pain.  He denies history of COPD.  No albuterol use at home.  Nothing makes his symptoms better.  He denies any recent surgery, travel, immobilization, trauma, testosterone use, prior blood clot or lower extremity swelling.  He states chest pain with coughing feels different than prior MI.  HPI  Past Medical History:  Diagnosis Date  . Coronary artery disease   . MI, old     Patient Active Problem List   Diagnosis Date Noted  . Chest wall pain 07/17/2012  . Hypotension, unspecified 05/31/2012  . Chest pain 05/30/2012  . Right  groin pain 05/30/2012  . Diarrhea 05/30/2012  . Tobacco abuse 05/30/2012    Past Surgical History:  Procedure Laterality Date  . HERNIA REPAIR    . LEFT HEART CATHETERIZATION WITH CORONARY ANGIOGRAM N/A 07/17/2012   Procedure: LEFT HEART CATHETERIZATION WITH CORONARY ANGIOGRAM;  Surgeon: Pamella Pert, MD;  Location: [facility] CATH LAB;  Service: Cardiovascular;  Laterality: N/A;        Home Medications    Prior to Admission medications   Medication Sig Start Date End Date Taking? Authorizing Provider  albuterol (PROVENTIL HFA;VENTOLIN HFA) 108 (90 Base) MCG/ACT inhaler Inhale 1-2 puffs into the lungs every 6 (six) hours as needed for wheezing or shortness of breath. Patient not taking: Reported on 02/06/2018 07/13/15   [name], Ace Gins, PA-C  albuterol (PROVENTIL HFA;VENTOLIN HFA) 108 [phone] Base) MCG/ACT inhaler Inhale 2 puffs into the lungs every 4 (four) hours as needed for wheezing or shortness of breath. Patient not taking: Reported on 02/06/2018 03/07/16   Joy, Ines Bloomer C, PA-C  benzonatate (TESSALON) 100 MG capsule Take 1 capsule (100 mg total) by mouth every 8 (eight) hours. Patient not taking: Reported on 02/06/2018 03/07/16   Harolyn Rutherford C, PA-C  cyclobenzaprine (FLEXERIL) 10 MG tablet Take 1 tablet (10 mg total) by mouth 2 (two) times daily as needed for muscle spasms. Patient not taking: Reported on 02/06/2018 12/18/14   Roxy Horseman, PA-C  guaiFENesin (MUCINEX) 600 MG 12 hr tablet Take 1 tablet (600 mg total) by mouth 2 (two) times daily as  needed for to loosen phlegm. Patient not taking: Reported on 02/06/2018 07/13/15   [name], Ace Gins, PA-C  ibuprofen (ADVIL,MOTRIN) 800 MG tablet Take 1 tablet (800 mg total) by mouth 3 (three) times daily. Patient not taking: Reported on 02/06/2018 12/18/14   Roxy Horseman, PA-C  methocarbamol (ROBAXIN) 500 MG tablet Take 1 tablet (500 mg total) by mouth 2 (two) times daily. Patient not taking: Reported on 02/06/2018 04/20/15   Jaynie Crumble, PA-C    naproxen (NAPROSYN) 500 MG tablet Take 1 tablet (500 mg total) by mouth 2 (two) times daily. Patient not taking: Reported on 02/06/2018 04/20/15   Jaynie Crumble, PA-C  naproxen (NAPROSYN) 500 MG tablet Take 1 tablet (500 mg total) by mouth 2 (two) times daily. Patient not taking: Reported on 02/06/2018 03/07/16   Joy, Shawn C, PA-C  ondansetron (ZOFRAN ODT) 4 MG disintegrating tablet Take 1 tablet (4 mg total) by mouth every 8 (eight) hours as needed for nausea or vomiting. Patient not taking: Reported on 02/06/2018 03/07/16   Joy, Hillard Danker, PA-C  oxyCODONE-acetaminophen (PERCOCET) 5-325 MG per tablet Take 1 tablet by mouth every 6 (six) hours as needed for moderate pain. Patient not taking: Reported on 02/06/2018 07/04/14   Purvis Sheffield, MD  traMADol (ULTRAM) 50 MG tablet Take 1 tablet (50 mg total) by mouth every 6 (six) hours as needed. Patient not taking: Reported on 02/06/2018 04/20/15   Jaynie Crumble, PA-C    Family History Family History  Problem Relation Age of Onset  . Diabetes Mellitus II Mother     Social History Social History   Tobacco Use  . Smoking status: Current Every Day Smoker    Packs/day: 0.25    Types: Cigarettes  . Smokeless tobacco: Never Used  Substance Use Topics  . Alcohol use: No    Comment: occ  . Drug use: No     Allergies   Penicillins   Review of Systems Review of Systems  All other systems reviewed and are negative.    Physical Exam Updated Vital Signs BP (!) 142/94   Pulse 75   Temp 99.2 F (37.3 C) (Oral)   Resp (!) 22   Ht 6' (1.829 m)   Wt 87.1 kg   SpO2 100%   BMI 26.04 kg/m   Physical Exam  Constitutional: He appears well-developed and well-nourished.  HENT:  Head: Normocephalic and atraumatic.  Right Ear: External ear normal.  Left Ear: External ear normal.  Nose: Nose normal.  Mouth/Throat: Uvula is midline, oropharynx is clear and moist and mucous membranes are normal. No tonsillar exudate.  Eyes:  Pupils are equal, round, and reactive to light. Right eye exhibits no discharge. Left eye exhibits no discharge. No scleral icterus.  Neck: Trachea normal. Neck supple. No spinous process tenderness present. No neck rigidity. Normal range of motion present.  Cardiovascular: Normal rate, regular rhythm and intact distal pulses.  No murmur heard. Pulses:      Radial pulses are 2+ on the right side, and 2+ on the left side.       Dorsalis pedis pulses are 2+ on the right side, and 2+ on the left side.       Posterior tibial pulses are 2+ on the right side, and 2+ on the left side.  No lower extremity swelling or edema. Calves symmetric in size bilaterally.  Pulmonary/Chest: Effort normal. Wheezes:  Global, inspiratory and expiratory. He exhibits no tenderness.  Abdominal: Soft. Bowel sounds are normal. He exhibits no distension. There  is tenderness in the right upper quadrant and epigastric area. There is positive Murphy's sign. There is no rigidity, no rebound, no guarding, no CVA tenderness and no tenderness at McBurney's point.  Musculoskeletal: He exhibits no edema.  Lymphadenopathy:    He has no cervical adenopathy.  Neurological: He is alert.  Skin: Skin is warm and dry. No rash noted. He is not diaphoretic.  Psychiatric: He has a normal mood and affect.  Nursing note and vitals reviewed.    ED Treatments / Results  Labs (all labs ordered are listed, but only abnormal results are displayed) Labs Reviewed  LIPASE, BLOOD - Abnormal; Notable for the following components:      Result Value   Lipase 81 (*)    All other components within normal limits  COMPREHENSIVE METABOLIC PANEL - Abnormal; Notable for the following components:   Glucose, Bld 137 (*)    All other components within normal limits  CBC - Abnormal; Notable for the following components:   Hemoglobin 17.2 (*)    All other components within normal limits  URINALYSIS, ROUTINE W REFLEX MICROSCOPIC - Abnormal; Notable for  the following components:   Ketones, ur 5 (*)    All other components within normal limits  I-STAT TROPONIN, ED    EKG None  Radiology Dg Chest 2 View  Result Date: 02/06/2018 CLINICAL DATA:  Shortness of breath and chest pain. Nausea and vomiting. EXAM: CHEST - 2 VIEW COMPARISON:  Chest x-ray dated March 07, 2016. FINDINGS: The heart size and mediastinal contours are within normal limits. Normal pulmonary vascularity. The lungs remain hyperinflated. No focal consolidation, pleural effusion, or pneumothorax. No acute osseous abnormality. IMPRESSION: 1. COPD.  No active cardiopulmonary disease. Electronically Signed   By: Obie DredgeWilliam T Derry M.D.   On: 02/06/2018 12:42   [place] Abdomen Limited Ruq  Result Date: 02/06/2018 CLINICAL DATA:  One day history of right upper quadrant discomfort. EXAM: ULTRASOUND ABDOMEN LIMITED RIGHT UPPER QUADRANT COMPARISON:  None in PACs FINDINGS: Gallbladder: No gallstones or wall thickening visualized. No sonographic Murphy sign noted by sonographer. Common bile duct: Diameter: 3.2 mm Liver: No focal lesion identified. Within normal limits in parenchymal echogenicity. Portal vein is patent on color Doppler imaging with normal direction of blood flow towards the liver. IMPRESSION: Normal right upper quadrant abdominal ultrasound. Electronically Signed   By: David  SwazilandJordan M.D.   On: 02/06/2018 15:35    Procedures Procedures (including critical care time)  Medications Ordered in ED Medications  morphine 4 MG/ML injection 4 mg (has no administration in time range)  sodium chloride 0.9 % bolus 1,000 mL (has no administration in time range)  ondansetron (ZOFRAN) injection 4 mg (has no administration in time range)  methylPREDNISolone sodium succinate (SOLU-MEDROL) 125 mg/2 mL injection 125 mg (has no administration in time range)  ipratropium-albuterol (DUONEB) 0.5-2.5 (3) MG/3ML nebulizer solution 3 mL (has no administration in time range)  ipratropium-albuterol  (DUONEB) 0.5-2.5 (3) MG/3ML nebulizer solution 3 mL (has no administration in time range)  ibuprofen (ADVIL,MOTRIN) tablet 400 mg (400 mg Oral Given 02/06/18 0932)     Initial Impression / Assessment and Plan / ED Course  I have reviewed the triage vital signs and the nursing notes.  Pertinent labs & imaging results that were available during my care of the patient were reviewed by me and considered in my medical decision making (see chart for details).     57 y.o. with history of CAD, tobacco abuse presenting with 2 separate complaints.  Patient reports that Saturday evening after eating a large meal he started having epigastric and right upper quadrant abdominal pain without radiation that is associated with nausea and nonbilious, nonbloody emesis.  He reports his symptoms are worse with eating.  He reports normal bowel movement today.  No melena or hematochezia.  No previous abdominal surgeries.  He has no history of pancreatitis or gallstones.  He denies any alcohol use in the last 4 years.  On exam patient's mucous membranes are moist.  Abdomen is soft and with tenderness of the epigastric and right upper quadrant.  Patient does have a positive Murphy sign.  Will obtain right upper quadrant ultrasound and correlate with labs.  Will give IV fluids, nausea medication and pain medication.  Patient made n.p.o.  Patient also reports on Sunday he started having shortness of breath with exertion as well as a dry nonproductive cough.  He reports he is developed chest pain when coughing but denies chest pain otherwise.  He denies exertional, pleuritic or positional chest pain.  He denies any hemoptysis.  No lower extremity edema.  Patient is without tachycardia or hypoxia in the department.  No fevers at home or in the department.  EKG without evidence of STEMI.  There is nonspecific T wave abnormalities.  Troponin within normal limits.  Chest x-ray consistent with COPD.  There is no evidence of  cardiomegaly, consolidation, infiltrate, pleural effusion or pneumothorax.  On exam patient is in no respiratory distress.  He has an story and expiratory wheezing.  Will give IV Solu-Medrol with back to back DuoNeb's and reevaluate.  3:21 PM Patient still in US. No able to re-evaluate.   5:16 PM Patient RUQ US. No abdominal ttp on repeat exam.  Lipase is mildly elevated at 81.  Is not 3 times upper limit normal to be consistent with pancreatitis.  LFTs are unremarkable.  Patient without leukocytosis.  No electrolight abnormalities.  No evidence of DKA.  No UTI.  Repeat abdominal exam without any tenderness.  Do not feel he needs CT scan at this time. He does not have a surgical abdomen. No N/V in the department. No indication for further workup. He is to follow up with his PCP for elevated lipase. Return precautions discussed. He states understanding.   Patient reports that his breathing has improved and he is no longer short of breath since receiving DuoNeb's in the department.  Patient's chest x-ray without evidence of pneumonia, pleural effusion, consolidation, infiltrates, cardiomegaly, pneumothorax, vascular congestion or other abnormalities.  Patient troponin within normal limits.  EKG NSR. He was ambulated in the department without hypoxia or shortness of breath or chest pain.  Patient is symptoms consistent with bronchitis.  Symptoms atypical for ACS.  Will discharge patient home on albuterol inhaler (given in the department), and steroid burst. He is to establish care with a PCP.  This importance of following up. I advised the patient to follow-up with PCP this week. Specific return precautions discussed. Time was given for all questions to be answered. The patient verbalized understanding and agreement with plan. The patient appears safe for discharge home.  Discussed smoking cessation with patient and was they were offerred resources to help stop.  Total time was 5 min CPT code [id number].   Final  Clinical Impressions(s) / ED Diagnoses   Final diagnoses:  RUQ abdominal pain  Bronchitis  Cough  Shortness of breath  Non-intractable vomiting with nausea, unspecified vomiting type  Elevated lipase    ED Discharge Orders  None       Princella Pellegrini 02/06/18 1906    Virgina Norfolk, DO 02/06/18 2332

## 2018-02-06 NOTE — ED Notes (Signed)
Pt endorses headache x3 days as well.

## 2018-02-06 NOTE — ED Triage Notes (Signed)
Pt to ER for evaluation of nausea and vomiting onset yesterday after breakfast yesterday, states also shortness of breath. States ate a tuna melt. Reports no diarrhea.

## 2018-02-06 NOTE — ED Notes (Signed)
Patient transported to X-ray 

## 2018-02-06 NOTE — ED Triage Notes (Signed)
Also endorses central chest pain onset after vomiting began yesterday.

## 2018-02-06 NOTE — Discharge Instructions (Signed)
He was seen here today for shortness of breath, cough, nausea and vomiting.  Your lab work and imaging was reassuring.  Your lipase was noted to be elevated during today's visit.  You will need to follow-up with your primary care provider in regards to this.  You did have a ultrasound of her gallbladder that was unremarkable today.  If you develop worsening pain, persistent vomiting despite Zofran, fever, or any other concerning symptoms he should present to the emergency department for further evaluation  You are noted to have shortness of breath and department.  This improved after albuterol therapies.  You are ambulated prior to discharge without shortness of breath or desaturations of your oxygen.  I am starting you on a steroid burst.  These take as prescribed.  Please use albuterol inhaler every 4-6 hours as needed for shortness of breath.  Please follow-up with your primary care provider this week.  Is very important that you follow-up.  Please return for any chest pain, worsening shortness of breath, if you cough up blood, having increasing abdominal pain, continued to vomit despite Zofran, develop fever or have any other concerning symptoms.

## 2018-02-06 NOTE — ED Notes (Signed)
Ambulated Pt in Hallway. Pt stayed between 94%-96% the whole walk. Pt stated he was not short of breath while walking and that he is feeling better then when he arrived. Pt back in Bed and sating at 95%.

## 2018-08-30 ENCOUNTER — Emergency Department (HOSPITAL_COMMUNITY)
Admission: EM | Admit: 2018-08-30 | Discharge: 2018-08-30 | Disposition: A | Discharge status: Home / Self Care | Payer: Self-pay | Attending: Emergency Medicine | Admitting: Emergency Medicine

## 2018-08-30 ENCOUNTER — Emergency Department (HOSPITAL_COMMUNITY): Payer: Self-pay

## 2018-08-30 ENCOUNTER — Other Ambulatory Visit: Payer: Self-pay

## 2018-08-30 ENCOUNTER — Encounter (HOSPITAL_COMMUNITY): Payer: Self-pay

## 2018-08-30 DIAGNOSIS — M545 Low back pain, unspecified: Secondary | ICD-10-CM

## 2018-08-30 DIAGNOSIS — F17210 Nicotine dependence, cigarettes, uncomplicated: Secondary | ICD-10-CM | POA: Insufficient documentation

## 2018-08-30 DIAGNOSIS — I252 Old myocardial infarction: Secondary | ICD-10-CM | POA: Insufficient documentation

## 2018-08-30 DIAGNOSIS — I2510 Atherosclerotic heart disease of native coronary artery without angina pectoris: Secondary | ICD-10-CM | POA: Insufficient documentation

## 2018-08-30 DIAGNOSIS — M542 Cervicalgia: Secondary | ICD-10-CM | POA: Insufficient documentation

## 2018-08-30 MED ORDER — METHOCARBAMOL 500 MG PO TABS: 500.0000 mg | ORAL_TABLET | Freq: Two times a day (BID) | ORAL | 0 refills | Status: DC

## 2018-08-30 MED ORDER — METHOCARBAMOL 500 MG PO TABS
500.0000 mg | ORAL_TABLET | Freq: Once | ORAL | Status: AC
  Administered 2018-08-30: 500 mg via ORAL
  Filled 2018-08-30: qty 1

## 2018-08-30 NOTE — ED Provider Notes (Signed)
[facility] EMERGENCY DEPARTMENT Provider Note   CSN: [id number] Arrival date & time: 08/30/18  1047   History   Chief Complaint Chief Complaint  Patient presents with  . Back Pain    HPI Steadman Manglicmot is a 58 y.o. male.     HPI   58 year old male presents today with complaints of low back pain.  Patient notes approximate 3 weeks ago he developed a sharp pain in his left lower back.  He notes this is improved ambulation, worse while sitting.  Patient denies any radiation of symptoms into his abdomen, lower extremities, denies any loss of distal sensation strength or motor function.  He denies any trauma, fever, IV drug use or history of the same.  Patient notes taking Tylenol at home without symptomatic improvement.  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    Past Medical History:  Diagnosis Date  . Coronary artery disease   . MI, old     Patient Active Problem List   Diagnosis Date Noted  . Chest wall pain 07/17/2012  . Hypotension, unspecified 05/31/2012  . Chest pain 05/30/2012  . Right groin pain 05/30/2012  . Diarrhea 05/30/2012  . Tobacco abuse 05/30/2012    Past Surgical History:  Procedure Laterality Date  . HERNIA REPAIR    . LEFT HEART CATHETERIZATION WITH CORONARY ANGIOGRAM N/A 07/17/2012   Procedure: LEFT HEART CATHETERIZATION WITH CORONARY ANGIOGRAM;  Surgeon: Pamella Pert, MD;  Location: [facility] CATH LAB;  Service: Cardiovascular;  Laterality: N/A;        Home Medications    Prior to Admission medications   Medication Sig Start Date End Date Taking? Authorizing Provider  albuterol (PROVENTIL HFA;VENTOLIN HFA) 108 (90 Base) MCG/ACT inhaler Inhale 1-2 puffs into the lungs every 6 (six) hours as needed for wheezing or shortness of breath. Patient not taking: Reported on 02/06/2018 07/13/15   [name], Ace Gins, PA-C  albuterol (PROVENTIL HFA;VENTOLIN HFA) 108 [phone] Base) MCG/ACT inhaler Inhale 2 puffs into the lungs  every 4 (four) hours as needed for wheezing or shortness of breath. Patient not taking: Reported on 02/06/2018 03/07/16   Joy, Ines Bloomer C, PA-C  benzonatate (TESSALON) 100 MG capsule Take 1 capsule (100 mg total) by mouth every 8 (eight) hours. Patient not taking: Reported on 02/06/2018 03/07/16   Harolyn Rutherford C, PA-C  cyclobenzaprine (FLEXERIL) 10 MG tablet Take 1 tablet (10 mg total) by mouth 2 (two) times daily as needed for muscle spasms. Patient not taking: Reported on 02/06/2018 12/18/14   Roxy Horseman, PA-C  dicyclomine (BENTYL) 20 MG tablet Take 1 tablet (20 mg total) by mouth 2 (two) times daily. 02/06/18   Maczis, Elmer Sow, PA-C  guaiFENesin (MUCINEX) 600 MG 12 hr tablet Take 1 tablet (600 mg total) by mouth 2 (two) times daily as needed for to loosen phlegm. Patient not taking: Reported on 02/06/2018 07/13/15   [name], Ace Gins, PA-C  ibuprofen (ADVIL,MOTRIN) 800 MG tablet Take 1 tablet (800 mg total) by mouth 3 (three) times daily. Patient not taking: Reported on 02/06/2018 12/18/14   Roxy Horseman, PA-C  methocarbamol (ROBAXIN) 500 MG tablet Take 1 tablet (500 mg total) by mouth 2 (two) times daily. 08/30/18   Murel Shenberger, Tinnie Gens, PA-C  naproxen (NAPROSYN) 500 MG tablet Take 1 tablet (500 mg total) by mouth 2 (two) times daily. Patient not taking: Reported on 02/06/2018 04/20/15   Jaynie Crumble, PA-C  naproxen (NAPROSYN) 500 MG tablet Take 1 tablet (500 mg  total) by mouth 2 (two) times daily. Patient not taking: Reported on 02/06/2018 03/07/16   Joy, Shawn C, PA-C  ondansetron (ZOFRAN ODT) 4 MG disintegrating tablet Take 1 tablet (4 mg total) by mouth every 8 (eight) hours as needed for nausea or vomiting. Patient not taking: Reported on 02/06/2018 03/07/16   Joy, Ines Bloomer C, PA-C  ondansetron (ZOFRAN) 4 MG tablet Take 1 tablet (4 mg total) by mouth every 8 (eight) hours as needed for nausea or vomiting. 02/06/18   Maczis, Elmer Sow, PA-C  oxyCODONE-acetaminophen (PERCOCET) 5-325 MG per tablet Take 1  tablet by mouth every 6 (six) hours as needed for moderate pain. Patient not taking: Reported on 02/06/2018 07/04/14   Purvis Sheffield, MD  traMADol (ULTRAM) 50 MG tablet Take 1 tablet (50 mg total) by mouth every 6 (six) hours as needed. Patient not taking: Reported on 02/06/2018 04/20/15   Jaynie Crumble, PA-C    Family History Family History  Problem Relation Age of Onset  . Diabetes Mellitus II Mother     Social History Social History   Tobacco Use  . Smoking status: Current Every Day Smoker    Packs/day: 0.25    Types: Cigarettes  . Smokeless tobacco: Never Used  Substance Use Topics  . Alcohol use: No    Comment: occ  . Drug use: No     Allergies   Penicillins   Review of Systems Review of Systems  All other systems reviewed and are negative.    Physical Exam Updated Vital Signs BP (!) 145/96 (BP Location: Right Arm)   Pulse 78   Temp 98.4 F (36.9 C) (Oral)   Resp 18   Ht  (1.803 m)   Wt 80.7 kg   SpO2 96%   BMI 24.83 kg/m   Physical Exam Vitals signs and nursing note reviewed.  Constitutional:      Appearance: He is well-developed.  HENT:     Head: Normocephalic and atraumatic.  Eyes:     General: No scleral icterus.       Right eye: No discharge.        Left eye: No discharge.     Conjunctiva/sclera: Conjunctivae normal.     Pupils: Pupils are equal, round, and reactive to light.  Neck:     Musculoskeletal: Normal range of motion.     Vascular: No JVD.     Trachea: No tracheal deviation.  Pulmonary:     Effort: Pulmonary effort is normal.     Breath sounds: No stridor.  Abdominal:     Comments: Soft nontender  Musculoskeletal:     Comments: No CTL spine tenderness palpation, tenderness palpation of left lateral lumbar musculature, straight leg negative bilateral, distal sensation strength motor function intact  Neurological:     Mental Status: He is alert and oriented to person, place, and time.     Coordination:  Coordination normal.  Psychiatric:        Behavior: Behavior normal.        Thought Content: Thought content normal.        Judgment: Judgment normal.      ED Treatments / Results  Labs (all labs ordered are listed, but only abnormal results are displayed) Labs Reviewed - No data to display  EKG None  Radiology Dg Lumbar Spine Complete  Result Date: 08/30/2018 CLINICAL DATA:  Lumbago EXAM: LUMBAR SPINE - COMPLETE 4+ VIEW COMPARISON:  April 20, 2015 FINDINGS: Frontal, lateral, spot lumbosacral lateral, and bilateral oblique views were  obtained. There are 5 non-rib-bearing lumbar type vertebral bodies. There is no fracture or spondylolisthesis. The disc spaces appear unremarkable. There is facet osteoarthritic change at L4-5 and L5-S1 on the left. No erosive changes. 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 No fracture or spondylolisthesis. No appreciable disc space narrowing or erosion. Electronically Signed   By: Bretta Bang III M.D.   On: 08/30/2018 11:39    Procedures Procedures (including critical care time)  Medications Ordered in ED Medications  methocarbamol (ROBAXIN) tablet 500 mg (has no administration in time range)     Initial Impression / Assessment and Plan / ED Course  I have reviewed the triage vital signs and the nursing notes.  Pertinent labs & imaging results that were available during my care of the patient were reviewed by me and considered in my medical decision making (see chart for details).        Labs:   Imaging: DG Lumbar   Consults:  Therapeutics:  Discharge Meds:   Assessment/Plan: 58 year old male presents today with low back pain.  He has had 3 weeks of symptoms.  He has no red flags.  Patient will be discharged with symptomatic care outpatient follow-up and strict return precautions.  He verbalized understanding and agreement to today's plan had no further questions or concerns the time of discharge.    Final Clinical Impressions(s) / ED Diagnoses   Final diagnoses:  Acute left-sided low back pain without sciatica    ED Discharge Orders         Ordered    methocarbamol (ROBAXIN) 500 MG tablet  2 times daily     08/30/18 1104           Eyvonne Mechanic, PA-C 08/30/18 1147    Little, Ambrose Finland, MD 09/01/18 [phone]

## 2018-08-30 NOTE — Discharge Instructions (Signed)
Please read attached information. If you experience any new or worsening signs or symptoms please return to the emergency room for evaluation. Please follow-up with your primary care provider or specialist as discussed. Please use medication prescribed only as directed and discontinue taking if you have any concerning signs or symptoms.   °

## 2018-08-30 NOTE — ED Triage Notes (Signed)
Pt developed back pain 2-3 weeks ago, worse this morning with pain that awoke him at 0300.  Pt reports left sided back pain.  Denies numbness tingling

## 2019-03-27 ENCOUNTER — Encounter (HOSPITAL_COMMUNITY): Payer: Self-pay | Admitting: Emergency Medicine

## 2019-03-27 ENCOUNTER — Other Ambulatory Visit: Payer: Self-pay

## 2019-03-27 ENCOUNTER — Emergency Department (HOSPITAL_COMMUNITY)
Admission: EM | Admit: 2019-03-27 | Discharge: 2019-03-27 | Disposition: A | Discharge status: Home / Self Care | Payer: Self-pay | Attending: Emergency Medicine | Admitting: Emergency Medicine

## 2019-03-27 DIAGNOSIS — I2510 Atherosclerotic heart disease of native coronary artery without angina pectoris: Secondary | ICD-10-CM | POA: Insufficient documentation

## 2019-03-27 DIAGNOSIS — J0100 Acute maxillary sinusitis, unspecified: Secondary | ICD-10-CM | POA: Insufficient documentation

## 2019-03-27 DIAGNOSIS — F17210 Nicotine dependence, cigarettes, uncomplicated: Secondary | ICD-10-CM | POA: Insufficient documentation

## 2019-03-27 DIAGNOSIS — R070 Pain in throat: Secondary | ICD-10-CM | POA: Insufficient documentation

## 2019-03-27 DIAGNOSIS — I252 Old myocardial infarction: Secondary | ICD-10-CM | POA: Insufficient documentation

## 2019-03-27 LAB — GROUP A STREP BY PCR: Group A Strep by PCR: NOT DETECTED

## 2019-03-27 MED ORDER — DOXYCYCLINE HYCLATE 100 MG PO CAPS: 100.0000 mg | ORAL_CAPSULE | Freq: Two times a day (BID) | ORAL | 0 refills | Status: AC

## 2019-03-27 MED ORDER — IBUPROFEN 200 MG PO TABS
600.0000 mg | ORAL_TABLET | Freq: Once | ORAL | Status: AC
  Administered 2019-03-27: 600 mg via ORAL
  Filled 2019-03-27: qty 3

## 2019-03-27 NOTE — ED Triage Notes (Signed)
Per pt, states post nasal drip, congestion, sore throat for about 4 days-states he might have a sinus infection

## 2019-03-27 NOTE — ED Provider Notes (Signed)
[facility]-EMERGENCY DEPT Provider Note   CSN: [id number] Arrival date & time: 03/27/19  1552     History   Chief Complaint Chief Complaint  Patient presents with  . Nasal Congestion    HPI Sean Martinez is a 58 y.o. male who presents to the ED today complaining of gradual onset, constant, achy/burning, sore throat x4 days.  Patient also complains of postnasal drip, nasal congestion, sinus pressure.  States he has been taking over-the-counter cough drops without relief.  No recent sick contacts.  Patient denies fever, chills, ear pain, cough, shortness of breath, bloody to swallow, voice change, drooling, other associated symptoms.        Past Medical History:  Diagnosis Date  . Coronary artery disease   . MI, old     Patient Active Problem List   Diagnosis Date Noted  . Chest wall pain 07/17/2012  . Hypotension, unspecified 05/31/2012  . Chest pain 05/30/2012  . Right groin pain 05/30/2012  . Diarrhea 05/30/2012  . Tobacco abuse 05/30/2012    Past Surgical History:  Procedure Laterality Date  . HERNIA REPAIR    . LEFT HEART CATHETERIZATION WITH CORONARY ANGIOGRAM N/A 07/17/2012   Procedure: LEFT HEART CATHETERIZATION WITH CORONARY ANGIOGRAM;  Surgeon: Pamella PertJagadeesh R Ganji, MD;  [facility];  Service: Cardiovascular;  Laterality: N/A;        Home Medications    Prior to Admission medications   Medication Sig Start Date End Date Taking? Authorizing Provider  albuterol (PROVENTIL HFA;VENTOLIN HFA) 108 (90 Base) MCG/ACT inhaler Inhale 1-2 puffs into the lungs every 6 (six) hours as needed for wheezing or shortness of breath. Patient not taking: Reported on 02/06/2018 07/13/15   [name], Ace GinsSerena Y, PA-C  albuterol (PROVENTIL HFA;VENTOLIN HFA) 108 [phone](90 Base) MCG/ACT inhaler Inhale 2 puffs into the lungs every 4 (four) hours as needed for wheezing or shortness of breath. Patient not taking: Reported on 02/06/2018 03/07/16   Joy, Ines BloomerShawn C, PA-C   benzonatate (TESSALON) 100 MG capsule Take 1 capsule (100 mg total) by mouth every 8 (eight) hours. Patient not taking: Reported on 02/06/2018 03/07/16   Harolyn RutherfordJoy, Shawn C, PA-C  cyclobenzaprine (FLEXERIL) 10 MG tablet Take 1 tablet (10 mg total) by mouth 2 (two) times daily as needed for muscle spasms. Patient not taking: Reported on 02/06/2018 12/18/14   Roxy HorsemanBrowning, Robert, PA-C  dicyclomine (BENTYL) 20 MG tablet Take 1 tablet (20 mg total) by mouth 2 (two) times daily. Patient not taking: Reported on 03/27/2019 02/06/18   Maczis, Elmer SowMichael M, PA-C  doxycycline (VIBRAMYCIN) 100 MG capsule Take 1 capsule (100 mg total) by mouth 2 (two) times daily for 7 days. 03/27/19 04/03/19  Tanda RockersVenter, Mattson Dayal, PA-C  guaiFENesin (MUCINEX) 600 MG 12 hr tablet Take 1 tablet (600 mg total) by mouth 2 (two) times daily as needed for to loosen phlegm. Patient not taking: Reported on 02/06/2018 07/13/15   [name], Ace GinsSerena Y, PA-C  ibuprofen (ADVIL,MOTRIN) 800 MG tablet Take 1 tablet (800 mg total) by mouth 3 (three) times daily. Patient not taking: Reported on 02/06/2018 12/18/14   Roxy HorsemanBrowning, Robert, PA-C  methocarbamol (ROBAXIN) 500 MG tablet Take 1 tablet (500 mg total) by mouth 2 (two) times daily. Patient not taking: Reported on 03/27/2019 08/30/18   Hedges, Tinnie GensJeffrey, PA-C  naproxen (NAPROSYN) 500 MG tablet Take 1 tablet (500 mg total) by mouth 2 (two) times daily. Patient not taking: Reported on 02/06/2018 04/20/15   Jaynie CrumbleKirichenko, Tatyana, PA-C  naproxen (NAPROSYN) 500 MG tablet  Take 1 tablet (500 mg total) by mouth 2 (two) times daily. Patient not taking: Reported on 02/06/2018 03/07/16   Joy, Shawn C, PA-C  ondansetron (ZOFRAN ODT) 4 MG disintegrating tablet Take 1 tablet (4 mg total) by mouth every 8 (eight) hours as needed for nausea or vomiting. Patient not taking: Reported on 02/06/2018 03/07/16   Joy, Shawn C, PA-C  ondansetron (ZOFRAN) 4 MG tablet Take 1 tablet (4 mg total) by mouth every 8 (eight) hours as needed for nausea or vomiting.  Patient not taking: Reported on 03/27/2019 02/06/18   Maczis, Barth Kirks, PA-C  oxyCODONE-acetaminophen (PERCOCET) 5-325 MG per tablet Take 1 tablet by mouth every 6 (six) hours as needed for moderate pain. Patient not taking: Reported on 02/06/2018 07/04/14   Pamella Pert, MD  traMADol (ULTRAM) 50 MG tablet Take 1 tablet (50 mg total) by mouth every 6 (six) hours as needed. Patient not taking: Reported on 02/06/2018 04/20/15   Jeannett Senior, PA-C    Family History Family History  Problem Relation Age of Onset  . Diabetes Mellitus II Mother     Social History Social History   Tobacco Use  . Smoking status: Current Every Day Smoker    Packs/day: 0.25    Types: Cigarettes  . Smokeless tobacco: Never Used  Substance Use Topics  . Alcohol use: No    Comment: occ  . Drug use: No     Allergies   Penicillins   Review of Systems Review of Systems  Constitutional: Negative for chills and fever.  HENT: Positive for congestion, postnasal drip, sinus pressure and sore throat. Negative for ear discharge, ear pain, trouble swallowing and voice change.      Physical Exam Updated Vital Signs BP (!) 146/97 (BP Location: Right Arm)   Pulse 88   Temp 99.9 F (37.7 C) (Oral)   Resp 16   SpO2 97%   Physical Exam Vitals signs and nursing note reviewed.  Constitutional:      Appearance: He is not ill-appearing or diaphoretic.  HENT:     Head: Normocephalic and atraumatic.     Right Ear: Tympanic membrane normal.     Left Ear: Tympanic membrane normal.     Nose:     Comments: + Right maxillary sinus TTP    Mouth/Throat:     Mouth: Mucous membranes are moist.     Pharynx: Posterior oropharyngeal erythema present. No oropharyngeal exudate.     Comments: Posterior oropharynx mildly erythematous.  No tonsillar hypertrophy.  Uvula is midline.  Eyes:     Conjunctiva/sclera: Conjunctivae normal.  Cardiovascular:     Rate and Rhythm: Normal rate and regular rhythm.     Pulses:  Normal pulses.  Pulmonary:     Effort: Pulmonary effort is normal.     Breath sounds: Normal breath sounds. No wheezing, rhonchi or rales.  Skin:    General: Skin is warm and dry.     Coloration: Skin is not jaundiced.  Neurological:     Mental Status: He is alert.      ED Treatments / Results  Labs (all labs ordered are listed, but only abnormal results are displayed) Labs Reviewed  GROUP A STREP BY PCR    EKG None  Radiology No results found.  Procedures Procedures (including critical care time)  Medications Ordered in ED Medications  ibuprofen (ADVIL) tablet 600 mg (600 mg Oral Given 03/27/19 1949)     Initial Impression / Assessment and Plan / ED Course  I  have reviewed the triage vital signs and the nursing notes.  Pertinent labs & imaging results that were available during my care of the patient were reviewed by me and considered in my medical decision making (see chart for details).    58 year old male who presents to the ED today with sore throat, nasal congestion, sinus pressure x4 days.  His temp is 99.9 on arrival.  Recheck 99.2.  Patient without tachycardia or tachypnea.  Not appear toxic on exam.  His TMs are clear.  His posterior oropharynx is mildly erythematous but without edema or exudate.  He does have right maxillary sinus tenderness to palpation.  Will swab for strep at this time.  If negative will treat as sinus infection today.  No red flags concerning for deep space infection of the neck.   Strep test negative. Will treat as sinusitis today. Pt given doxy. He is advised to follow up with his PCP. Strict return precautions have been discussed. Stable for discharge at this time.   This note was prepared using Dragon voice recognition software and may include unintentional dictation errors due to the inherent limitations of voice recognition software.        Final Clinical Impressions(s) / ED Diagnoses   Final diagnoses:  Acute maxillary  sinusitis, recurrence not specified    ED Discharge Orders         Ordered    doxycycline (VIBRAMYCIN) 100 MG capsule  2 times daily     03/27/19 2035           Tanda Rockers, PA-C 03/27/19 2038    Virgina Norfolk, DO 03/27/19 2350

## 2019-03-27 NOTE — Discharge Instructions (Addendum)
Your strep test was negative Your symptoms are likely related to a sinus infection. Please take antibiotics as prescribed.  Follow up with your PCP. If you do not have one you may follow up with [facility].

## 2019-04-12 ENCOUNTER — Emergency Department (HOSPITAL_COMMUNITY): Payer: Self-pay

## 2019-04-12 ENCOUNTER — Other Ambulatory Visit: Payer: Self-pay

## 2019-04-12 ENCOUNTER — Emergency Department (HOSPITAL_COMMUNITY)
Admission: EM | Admit: 2019-04-12 | Discharge: 2019-04-13 | Disposition: A | Discharge status: Home / Self Care | Payer: Self-pay | Attending: Emergency Medicine | Admitting: Emergency Medicine

## 2019-04-12 ENCOUNTER — Encounter (HOSPITAL_COMMUNITY): Payer: Self-pay | Admitting: *Deleted

## 2019-04-12 DIAGNOSIS — R0602 Shortness of breath: Secondary | ICD-10-CM | POA: Insufficient documentation

## 2019-04-12 DIAGNOSIS — R059 Cough, unspecified: Secondary | ICD-10-CM

## 2019-04-12 DIAGNOSIS — Z20828 Contact with and (suspected) exposure to other viral communicable diseases: Secondary | ICD-10-CM | POA: Insufficient documentation

## 2019-04-12 DIAGNOSIS — R0600 Dyspnea, unspecified: Secondary | ICD-10-CM | POA: Insufficient documentation

## 2019-04-12 DIAGNOSIS — R112 Nausea with vomiting, unspecified: Secondary | ICD-10-CM | POA: Insufficient documentation

## 2019-04-12 DIAGNOSIS — I252 Old myocardial infarction: Secondary | ICD-10-CM | POA: Insufficient documentation

## 2019-04-12 DIAGNOSIS — R054 Cough syncope: Secondary | ICD-10-CM

## 2019-04-12 DIAGNOSIS — R05 Cough: Secondary | ICD-10-CM

## 2019-04-12 DIAGNOSIS — R0789 Other chest pain: Secondary | ICD-10-CM | POA: Insufficient documentation

## 2019-04-12 DIAGNOSIS — F17210 Nicotine dependence, cigarettes, uncomplicated: Secondary | ICD-10-CM | POA: Insufficient documentation

## 2019-04-12 DIAGNOSIS — R066 Hiccough: Secondary | ICD-10-CM | POA: Insufficient documentation

## 2019-04-12 DIAGNOSIS — R55 Syncope and collapse: Secondary | ICD-10-CM | POA: Insufficient documentation

## 2019-04-12 LAB — CBC WITH DIFFERENTIAL/PLATELET
Abs Immature Granulocytes: 0.01 10*3/uL (ref 0.00–0.07)
Basophils Absolute: 0 10*3/uL (ref 0.0–0.1)
Basophils Relative: 1 %
Eosinophils Absolute: 0 10*3/uL (ref 0.0–0.5)
Eosinophils Relative: 0 %
HCT: 51.6 % (ref 39.0–52.0)
Hemoglobin: 17.6 g/dL — ABNORMAL HIGH (ref 13.0–17.0)
Immature Granulocytes: 0 %
Lymphocytes Relative: 55 %
Lymphs Abs: 2.3 10*3/uL (ref 0.7–4.0)
MCH: 32.6 pg (ref 26.0–34.0)
MCHC: 34.1 g/dL (ref 30.0–36.0)
MCV: 95.6 fL (ref 80.0–100.0)
Monocytes Absolute: 0.4 10*3/uL (ref 0.1–1.0)
Monocytes Relative: 10 %
Neutro Abs: 1.4 10*3/uL — ABNORMAL LOW (ref 1.7–7.7)
Neutrophils Relative %: 34 %
Platelets: 293 10*3/uL (ref 150–400)
RBC: 5.4 MIL/uL (ref 4.22–5.81)
RDW: 12.7 % (ref 11.5–15.5)
WBC: 4.1 10*3/uL (ref 4.0–10.5)
nRBC: 0 % (ref 0.0–0.2)

## 2019-04-12 LAB — HEPATIC FUNCTION PANEL
ALT: 21 U/L (ref 0–44)
AST: 26 U/L (ref 15–41)
Albumin: 4.3 g/dL (ref 3.5–5.0)
Alkaline Phosphatase: 58 U/L (ref 38–126)
Bilirubin, Direct: 0.2 mg/dL (ref 0.0–0.2)
Indirect Bilirubin: 0.9 mg/dL (ref 0.3–0.9)
Total Bilirubin: 1.1 mg/dL (ref 0.3–1.2)
Total Protein: 7.7 g/dL (ref 6.5–8.1)

## 2019-04-12 LAB — BASIC METABOLIC PANEL
Anion gap: 14 (ref 5–15)
BUN: 21 mg/dL — ABNORMAL HIGH (ref 6–20)
CO2: 25 mmol/L (ref 22–32)
Calcium: 10.5 mg/dL — ABNORMAL HIGH (ref 8.9–10.3)
Chloride: 98 mmol/L (ref 98–111)
Creatinine, Ser: 1.08 mg/dL (ref 0.61–1.24)
GFR calc Af Amer: >60 mL/min (ref >60)
GFR calc non Af Amer: >60 mL/min (ref >60)
Glucose, Bld: 127 mg/dL — ABNORMAL HIGH (ref 70–99)
Potassium: 4.3 mmol/L (ref 3.5–5.1)
Sodium: 137 mmol/L (ref 135–145)

## 2019-04-12 LAB — LIPASE, BLOOD: Lipase: 24 U/L (ref 11–51)

## 2019-04-12 LAB — TROPONIN I (HIGH SENSITIVITY): Troponin I (High Sensitivity): 5 ng/L (ref <18)

## 2019-04-12 MED ORDER — METOCLOPRAMIDE HCL 10 MG PO TABS: 10.0000 mg | ORAL_TABLET | Freq: Four times a day (QID) | ORAL | 0 refills | Status: DC

## 2019-04-12 MED ORDER — PANTOPRAZOLE SODIUM 20 MG PO TBEC: 40.0000 mg | DELAYED_RELEASE_TABLET | Freq: Every day | ORAL | 0 refills | Status: DC

## 2019-04-12 MED ORDER — IOHEXOL 350 MG/ML SOLN
100.0000 mL | Freq: Once | INTRAVENOUS | Status: AC | PRN
  Administered 2019-04-12: 100 mL via INTRAVENOUS

## 2019-04-12 NOTE — ED Triage Notes (Signed)
Pt states that he has had hiccups since Saturday and today he had 2 syncopal episodes (no injury with them) and also states that he has been vomiting everything he eats and has been sob since today.  Pt is alert and oriented.

## 2019-04-12 NOTE — ED Provider Notes (Signed)
[facility] EMERGENCY DEPARTMENT Provider Note   CSN: [id number] Arrival date & time: 04/12/19  1338     History   Chief Complaint Chief Complaint  Patient presents with   Shortness of Breath   Loss of Consciousness    HPI Sean Martinez is a 58 y.o. male.     HPI   58yo male with history of CAD presents with concern for cough for 2 weeks, dyspnea starting Saturday, hiccups, nausea and vomiting since Sunday.    Hiccups since Saturday Tried holding breath, drinking water, sugar  Also having burning chest pain, began 2 days ago, no history of similar. Not worse with eating/exertion/deep breaths. Is worse after vomiting.  Everything tried to eat has been throwing up, spitting up, nausea and vomiting, haven't kept anything down since Sunday. Tried drinking coffee this AM, has only been able to hold down ice water.  Shortness of breath, started Saturday. Since hiccups started. Cough began 2 weeks ago, diagnosed with URI.    Seen at [facility], put on doxycycline for sinusitis.   Today had 2 syncopal episodes while coughing very hard  Coughing up white phlegm  Past Medical History:  Diagnosis Date   Coronary artery disease    MI, old     Patient Active Problem List   Diagnosis Date Noted   Chest wall pain 07/17/2012   Hypotension, unspecified 05/31/2012   Chest pain 05/30/2012   Right groin pain 05/30/2012   Diarrhea 05/30/2012   Tobacco abuse 05/30/2012    Past Surgical History:  Procedure Laterality Date   HERNIA REPAIR     LEFT HEART CATHETERIZATION WITH CORONARY ANGIOGRAM N/A 07/17/2012   Procedure: LEFT HEART CATHETERIZATION WITH CORONARY ANGIOGRAM;  Surgeon: Pamella Pert, MD;  Location: [facility] CATH LAB;  Service: Cardiovascular;  Laterality: N/A;        Home Medications    Prior to Admission medications   Medication Sig Start Date End Date Taking? Authorizing Provider  albuterol (PROVENTIL HFA;VENTOLIN HFA) 108 (90  Base) MCG/ACT inhaler Inhale 1-2 puffs into the lungs every 6 (six) hours as needed for wheezing or shortness of breath. 07/13/15  Yes [name], [name], PA-C  diphenhydramine-acetaminophen (TYLENOL PM) 25-500 MG TABS tablet Take 1 tablet by mouth at bedtime as needed (For pain).   Yes [provider]  metoCLOPramide (REGLAN) 10 MG tablet Take 1 tablet (10 mg total) by mouth every 6 (six) hours. 04/12/19   [name], MD  pantoprazole (PROTONIX) 20 MG tablet Take 2 tablets (40 mg total) by mouth daily for 14 days. 04/12/19 04/26/19  [name], MD    Family History Family History  Problem Relation Age of Onset   Diabetes Mellitus II Mother     Social History Social History   Tobacco Use   Smoking status: Current Every Day Smoker    Packs/day: 0.25    Types: Cigarettes   Smokeless tobacco: Never Used  Substance Use Topics   Alcohol use: No    Comment: occ   Drug use: No     Allergies   Penicillins   Review of Systems Review of Systems  Constitutional: Positive for diaphoresis (hot and cold). Negative for appetite change and fever.  HENT: Positive for congestion and sore throat.   Eyes: Negative for visual disturbance.  Respiratory: Positive for cough and shortness of breath.   Cardiovascular: Positive for chest pain.  Gastrointestinal: Positive for diarrhea (loose stool), nausea and vomiting. Negative for abdominal pain and constipation.  Genitourinary: Negative for dysuria.  Musculoskeletal: Negative for back pain.  Skin: Negative for rash.  Neurological: Positive for syncope. Negative for weakness, numbness and headaches (no change).     Physical Exam Updated Vital Signs BP 112/88    Pulse 92    Temp 98.2 F (36.8 C) (Oral)    Resp 18    Ht 5\' 11"  (1.803 m)    Wt 80.7 kg    SpO2 98%    BMI 24.83 kg/m   Physical Exam Vitals signs and nursing note reviewed.  Constitutional:      General: He is not in acute distress.    Appearance: He is  well-developed. He is not diaphoretic.  HENT:     Head: Normocephalic and atraumatic.  Eyes:     Conjunctiva/sclera: Conjunctivae normal.  Neck:     Musculoskeletal: Normal range of motion.  Cardiovascular:     Rate and Rhythm: Normal rate and regular rhythm.     Heart sounds: Normal heart sounds. No murmur. No friction rub. No gallop.   Pulmonary:     Effort: Pulmonary effort is normal. No respiratory distress.     Breath sounds: Normal breath sounds. No wheezing or rales.  Abdominal:     General: There is no distension.     Palpations: Abdomen is soft.     Tenderness: There is no abdominal tenderness. There is no guarding.  Skin:    General: Skin is warm and dry.  Neurological:     Mental Status: He is alert and oriented to person, place, and time.      ED Treatments / Results  Labs (all labs ordered are listed, but only abnormal results are displayed) Labs Reviewed  BASIC METABOLIC PANEL - Abnormal; Notable for the following components:      Result Value   Glucose, Bld 127 (*)    BUN 21 (*)    Calcium 10.5 (*)    All other components within normal limits  CBC WITH DIFFERENTIAL/PLATELET - Abnormal; Notable for the following components:   Hemoglobin 17.6 (*)    Neutro Abs 1.4 (*)    All other components within normal limits  NOVEL CORONAVIRUS, NAA (HOSP ORDER, SEND-OUT TO REF LAB; TAT 18-24 HRS)  HEPATIC FUNCTION PANEL  LIPASE, BLOOD  CBG MONITORING, ED  TROPONIN I (HIGH SENSITIVITY)  TROPONIN I (HIGH SENSITIVITY)    EKG EKG Interpretation  Date/Time:  Thursday April 12 2019 13:58:16 EDT Ventricular Rate:  96 PR Interval:  146 QRS Duration: 124 QT Interval:  360 QTC Calculation: 454 R Axis:   75 Text Interpretation:  Normal sinus rhythm Right bundle branch block Abnormal ECG RBBB new in comparison to prior, inferior ST depressions worse than prior Confirmed by Alvira MondaySchlossman, Ghada Abbett ([id number]) on 04/12/2019 6:04:13 PM   Radiology Ct Angio Chest Pe W And/or Wo  Contrast  Result Date: 04/12/2019 CLINICAL DATA:  Shortness of breath EXAM: CT ANGIOGRAPHY CHEST WITH CONTRAST TECHNIQUE: Multidetector CT imaging of the chest was performed using the standard protocol during bolus administration of intravenous contrast. Multiplanar CT image reconstructions and MIPs were obtained to evaluate the vascular anatomy. CONTRAST:  100mL OMNIPAQUE IOHEXOL 350 MG/ML SOLN COMPARISON:  None. FINDINGS: Cardiovascular: There is a optimal opacification of the pulmonary arteries. There is no central,segmental, or subsegmental filling defects within the pulmonary arteries. The heart is normal in size. No pericardial effusion or thickening. No evidence right heart strain. There is normal three-vessel brachiocephalic anatomy without proximal stenosis. The thoracic aorta is normal in  appearance. Mediastinum/Nodes: No hilar, mediastinal, or axillary adenopathy. Thyroid gland, trachea, and esophagus demonstrate no significant findings. Lungs/Pleura: Mild centrilobular emphysematous changes are seen at the lung apices. No large airspace consolidation or pleural effusion. Upper Abdomen: No acute abnormalities present in the visualized portions of the upper abdomen. Musculoskeletal: No chest wall abnormality. No acute or significant osseous findings. Review of the MIP images confirms the above findings. IMPRESSION: No central, segmental, or subsegmental pulmonary embolism. Mild centrilobular emphysematous changes at the lung apices. No other acute intrathoracic pathology to explain the patient's symptoms. Electronically Signed   By: Prudencio Pair M.D.   On: 04/12/2019 20:14    Procedures Procedures (including critical care time)  Medications Ordered in ED Medications  iohexol (OMNIPAQUE) 350 MG/ML injection 100 mL (100 mLs Intravenous Contrast Given 04/12/19 1950)     Initial Impression / Assessment and Plan / ED Course  I have reviewed the triage vital signs and the nursing  notes.  Pertinent labs & imaging results that were available during my care of the patient were reviewed by me and considered in my medical decision making (see chart for details).        58yo male with history of CAD presents with concern for cough for 2 weeks, dyspnea starting Saturday, hiccups, nausea and vomiting since Sunday.  EKG shows new RBBB, and given symptoms of dyspnea, cough, CP with new RBBB and hiccups, syncope, ordered CT PE study which shows no evidence of PE, no pneumonia, no sign of other esophageal pathology or diaphragmatic abnormalities.  Troponin negative, doubt ACS given duration of symptoms. Burning CP likely secondary to GERD/possible esophagitis/vomiting.  Was recently on doxycycline but overall doubt pill esophagitis.  Given reglan and able to tolerate po in the ED.  Syncope likely cough syncope by hx, no sign of anemia/electrolyte abnormality/PE/ACS.  Labs without significant abnormalities to account for symptoms. Combination of cough, n/v, loose stool, congestion, sore throat consistent with viral infection. 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  Recommend quarantine until test results.     For hiccups, n/v, gave rx for reglan, PPI.  Recommend PCP follow up.  Patient discharged in stable condition with understanding of reasons to return.   Final Clinical Impressions(s) / ED Diagnoses   Final diagnoses:  Hiccups  Shortness of breath  Cough  Non-intractable vomiting with nausea, unspecified vomiting type  Cough syncope    ED Discharge Orders         Ordered    metoCLOPramide (REGLAN) 10 MG tablet  Every 6 hours     04/12/19 2134    pantoprazole (PROTONIX) 20 MG tablet  Daily     10 /22/20 2134           Gareth Morgan, MD 04/13/19 1247

## 2019-04-15 LAB — NOVEL CORONAVIRUS, NAA (HOSP ORDER, SEND-OUT TO REF LAB; TAT 18-24 HRS): SARS-CoV-2, NAA: NOT DETECTED

## 2020-05-19 ENCOUNTER — Encounter (HOSPITAL_COMMUNITY): Payer: Self-pay

## 2020-05-19 ENCOUNTER — Emergency Department (HOSPITAL_COMMUNITY)
Admission: EM | Admit: 2020-05-19 | Discharge: 2020-05-19 | Disposition: A | Discharge status: Home / Self Care | Payer: Self-pay | Attending: Emergency Medicine | Admitting: Emergency Medicine

## 2020-05-19 ENCOUNTER — Other Ambulatory Visit: Payer: Self-pay

## 2020-05-19 DIAGNOSIS — B009 Herpesviral infection, unspecified: Secondary | ICD-10-CM | POA: Insufficient documentation

## 2020-05-19 DIAGNOSIS — I2510 Atherosclerotic heart disease of native coronary artery without angina pectoris: Secondary | ICD-10-CM | POA: Insufficient documentation

## 2020-05-19 DIAGNOSIS — F17210 Nicotine dependence, cigarettes, uncomplicated: Secondary | ICD-10-CM | POA: Insufficient documentation

## 2020-05-19 MED ORDER — VALACYCLOVIR HCL 1 G PO TABS: 1000.0000 mg | ORAL_TABLET | Freq: Every day | ORAL | 0 refills | Status: AC

## 2020-05-19 NOTE — Discharge Instructions (Signed)
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  Read information provided on herpes.  You are contagious while you have active lesions.  The medication helps stop active outbreaks, it can take time for all of these lesions to heal afterwards.  The virus will only dormant in your body and you can have additional outbreaks commonly in the setting of stress on her body or illness, if so we will need to follow closely with your primary care doctor for repeat treatment.  If this is not improving despite this medication please follow-up closely with primary care doctor or return for reevaluation.

## 2020-05-19 NOTE — ED Triage Notes (Addendum)
patient c/o sinus problems x 1 month and skin irritation x 1 week.  Patieanat added during triage that he has "bumps down there and under his lower lip x 1 week."

## 2020-05-19 NOTE — ED Provider Notes (Signed)
Sean Martinez Provider Note   CSN: [id number] Arrival date & time: 05/19/20  1132     History Chief Complaint  Patient presents with  . Sinus Problem  . skin irritation    Sean Martinez is a 59 y.o. male.  Sean Martinez is a 59 y.o. male with a history of previous MI and CAD, who presents to the emergency department for evaluation of rash to his scrotum and genitalia, as well as similar bumps around his mouth.  He states the initially he just thought that the irritation and discomfort over his scrotum was due to chafing, but he has tried multiple different over-the-counter creams and also tried hydrocortisone, but states nothing seems to help, over the past week he has had worsening of the rash over his scrotum with some bumps noted over his penis as well, he has several bumps around his mouth, he states they start as clear-whitish bumps and then seem to open up and cause an ulceration and scab.  Rash is not present anywhere else on the body of the genitalia and perioral region.  No new household products, or medications.  No associated facial swelling, shortness of breath, difficulty breathing.  No history of similar rash.  No history of herpes.  Did have new sexual partner over the past 2 months, but no known exposures        Past Medical History:  Diagnosis Date  . Coronary artery disease   . MI, old     Patient Active Problem List   Diagnosis Date Noted  . Chest wall pain 07/17/2012  . Hypotension, unspecified 05/31/2012  . Chest pain 05/30/2012  . Right groin pain 05/30/2012  . Diarrhea 05/30/2012  . Tobacco abuse 05/30/2012    Past Surgical History:  Procedure Laterality Date  . HERNIA REPAIR    . LEFT HEART CATHETERIZATION WITH CORONARY ANGIOGRAM N/A 07/17/2012   Procedure: LEFT HEART CATHETERIZATION WITH CORONARY ANGIOGRAM;  Surgeon: Pamella Pert, MD;  Location: [facility] CATH LAB;  Service: Cardiovascular;  Laterality:  N/A;       Family History  Problem Relation Age of Onset  . Diabetes Mellitus II Mother     Social History   Tobacco Use  . Smoking status: Current Every Day Smoker    Packs/day: 0.25    Types: Cigarettes  . Smokeless tobacco: Never Used  Vaping Use  . Vaping Use: Never used  Substance Use Topics  . Alcohol use: Not Currently  . Drug use: Not Currently    Home Medications Prior to Admission medications   Medication Sig Start Date End Date Taking? Authorizing Provider  albuterol (PROVENTIL HFA;VENTOLIN HFA) 108 (90 Base) MCG/ACT inhaler Inhale 1-2 puffs into the lungs every 6 (six) hours as needed for wheezing or shortness of breath. 07/13/15   [name], [name], PA-C  diphenhydramine-acetaminophen (TYLENOL PM) 25-500 MG TABS tablet Take 1 tablet by mouth at bedtime as needed (For pain).    [provider]  metoCLOPramide (REGLAN) 10 MG tablet Take 1 tablet (10 mg total) by mouth every 6 (six) hours. 04/12/19   [name], MD  pantoprazole (PROTONIX) 20 MG tablet Take 2 tablets (40 mg total) by mouth daily for 14 days. 04/12/19 04/26/19  [name], MD  valACYclovir (VALTREX) 1000 MG tablet Take 1 tablet (1,000 mg total) by mouth daily for 5 days. 05/19/20 05/24/20  [name], PA-C    Allergies    Penicillins  Review of Systems  Review of Systems  Constitutional: Negative for chills and fever.  HENT: Positive for mouth sores. Negative for sore throat.   Respiratory: Negative for shortness of breath.   Cardiovascular: Negative for chest pain.  Gastrointestinal: Negative for abdominal pain.  Genitourinary: Positive for scrotal swelling. Negative for discharge, dysuria and frequency.  Skin: Positive for rash.    Physical Exam Updated Vital Signs BP 118/78 (BP Location: Left Arm)   Pulse 100   Temp 99.1 F (37.3 C) (Oral)   Resp 17   Ht 6' (1.829 m)   Wt 84.8 kg   SpO2 96%   BMI 25.36 kg/m   Physical Exam Vitals and nursing note reviewed.    Constitutional:      General: He is not in acute distress.    Appearance: He is well-developed. He is not diaphoretic.  HENT:     Head: Normocephalic and atraumatic.     Mouth/Throat:     Comments: Multiple perioral vesicular lesions with 2 lesions below the lower lip that are scabbed over, no intraoral lesions, no lesions elsewhere on the face Eyes:     General:        Right eye: No discharge.        Left eye: No discharge.  Pulmonary:     Effort: Pulmonary effort is normal. No respiratory distress.  Genitourinary:    Comments: Numerous painful vesicular lesions noted over the scrotum on an erythematous base, there are small ulcerated lesions that are starting to scab over, a few similar lesions noted on the shaft of the penis as well, no lesions over the groin or pubic region Skin:    General: Skin is warm and dry.     Findings: Rash present.  Neurological:     Mental Status: He is alert and oriented to person, place, and time.     Coordination: Coordination normal.  Psychiatric:        Behavior: Behavior normal.     ED Results / Procedures / Treatments   Labs (all labs ordered are listed, but only abnormal results are displayed) Labs Reviewed - No data to display  EKG None  Radiology No results found.  Procedures Procedures (including critical care time)  Medications Ordered in ED Medications - No data to display  ED Course  I have reviewed the triage vital signs and the nursing notes.  Pertinent labs & imaging results that were available during my care of the patient were reviewed by me and considered in my medical decision making (see chart for details).    MDM Rules/Calculators/A&P                         Patient presents with redness and chafing around the scrotum with some lesions and areas of irritation on the penis as well that is been present for 3 weeks, not responding to creams or hydrocortisone, he is also developed similar lesions around his mouth  that started out as clear to white small bumps that then seemed to open up and cause a scabbed area.  He reports that these areas feel irritated, not itchy.  On exam rash seems most consistent with herpes, there are lesions of different stages, some clear vesicular lesions others that are ulcerated are scabbed over.  Patient denies any prior history of herpes infection.  The scrotum is erythematous and there are a few similar lesions on the shaft of the penis, no lesions within the groin or suprapubic area.  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  Strict return precautions discussed.  Patient expresses understanding and agreement.  Discharged home in good condition.   Final Clinical Impression(s) / ED Diagnoses Final diagnoses:  Herpes    Rx / DC Orders ED Discharge Orders         Ordered    valACYclovir (VALTREX) 1000 MG tablet  Daily        05/19/20 1524           Jodi Geralds [place], [place] 05/20/20 0434    Linwood Dibbles, MD 05/20/20 [phone]

## 2020-06-17 ENCOUNTER — Emergency Department (HOSPITAL_COMMUNITY)
Admission: EM | Admit: 2020-06-17 | Discharge: 2020-06-17 | Disposition: A | Discharge status: Home / Self Care | Payer: Self-pay | Attending: Emergency Medicine | Admitting: Emergency Medicine

## 2020-06-17 ENCOUNTER — Encounter (HOSPITAL_COMMUNITY): Payer: Self-pay | Admitting: Emergency Medicine

## 2020-06-17 ENCOUNTER — Other Ambulatory Visit: Payer: Self-pay

## 2020-06-17 DIAGNOSIS — B356 Tinea cruris: Secondary | ICD-10-CM | POA: Insufficient documentation

## 2020-06-17 DIAGNOSIS — B370 Candidal stomatitis: Secondary | ICD-10-CM | POA: Insufficient documentation

## 2020-06-17 DIAGNOSIS — F17210 Nicotine dependence, cigarettes, uncomplicated: Secondary | ICD-10-CM | POA: Insufficient documentation

## 2020-06-17 DIAGNOSIS — I2510 Atherosclerotic heart disease of native coronary artery without angina pectoris: Secondary | ICD-10-CM | POA: Insufficient documentation

## 2020-06-17 LAB — RAPID HIV SCREEN (HIV 1/2 AB+AG)
HIV 1/2 Antibodies: NONREACTIVE
HIV-1 P24 Antigen - HIV24: NONREACTIVE

## 2020-06-17 LAB — CBG MONITORING, ED: Glucose-Capillary: 108 mg/dL — ABNORMAL HIGH (ref 70–99)

## 2020-06-17 MED ORDER — FLUCONAZOLE 200 MG PO TABS
200.0000 mg | ORAL_TABLET | Freq: Once | ORAL | Status: AC
  Administered 2020-06-17: 200 mg via ORAL
  Filled 2020-06-17: qty 1

## 2020-06-17 MED ORDER — FLUCONAZOLE 200 MG PO TABS: 200.0000 mg | ORAL_TABLET | Freq: Every day | ORAL | 0 refills | Status: AC

## 2020-06-17 MED ORDER — CLOTRIMAZOLE 1 % EX CREA: TOPICAL_CREAM | CUTANEOUS | 0 refills | Status: DC

## 2020-06-17 NOTE — Discharge Instructions (Signed)
Please read the attachments on jock itch (tinea cruris) and oral thrush.  Please take your medications, as directed.  The Lotrimin cream should be applied to your genital region.  Please keep the area dry.    I would like you to follow-up with your primary care provider regarding today's encounter and for ongoing evaluation and management.  If you do not have one, please call the [facility] to get established with a PCP.  Return to the ED or seek immediate medical attention should you experience any new or worsening symptoms.

## 2020-06-17 NOTE — ED Triage Notes (Signed)
Pt reports herpes outbreak on his tongue from partner.

## 2020-06-17 NOTE — ED Provider Notes (Signed)
[facility] EMERGENCY DEPARTMENT Provider Note   CSN: [id number] Arrival date & time: 06/17/20  1231     History Chief Complaint  Patient presents with  . Exposure to STD    Sean Martinez is a 59 y.o. male with no relevant past medical history who presents the ED with complaints of herpes outbreak on his tongue.  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  He states that at that time he had vesicular lesions and they improved with the antiviral treatments.  However, approximately 1 week ago he developed red, itching, burning sensation involving his scrotum.  He denies any precipitating vesicular lesions similar to last encounter.  He also noticed that he has a white tongue.  He denies any painful lesions in his mouth, either.  I reviewed the past ED encounter and they noted a 3-week history of redness and chafing around the scrotum for approximately 3 weeks that were not responding to hydrocortisone cream.  There were also several adhesions around his mouth noted by prior provider.  Ultimately he was discharged home with Valtrex and encouraged to f/u with his PCP.  HPI     Past Medical History:  Diagnosis Date  . Coronary artery disease   . MI, old     Patient Active Problem List   Diagnosis Date Noted  . Chest wall pain 07/17/2012  . Hypotension, unspecified 05/31/2012  . Chest pain 05/30/2012  . Right groin pain 05/30/2012  . Diarrhea 05/30/2012  . Tobacco abuse 05/30/2012    Past Surgical History:  Procedure Laterality Date  . HERNIA REPAIR    . LEFT HEART CATHETERIZATION WITH CORONARY ANGIOGRAM N/A 07/17/2012   Procedure: LEFT HEART CATHETERIZATION WITH CORONARY ANGIOGRAM;  Surgeon: Pamella Pert, MD;  Location: [facility] CATH LAB;  Service: Cardiovascular;  Laterality: N/A;       Family History  Problem Relation Age of Onset  . Diabetes Mellitus II  Mother     Social History   Tobacco Use  . Smoking status: Current Every Day Smoker    Packs/day: 0.25    Types: Cigarettes  . Smokeless tobacco: Never Used  Vaping Use  . Vaping Use: Never used  Substance Use Topics  . Alcohol use: Not Currently  . Drug use: Not Currently    Home Medications Prior to Admission medications   Medication Sig Start Date End Date Taking? Authorizing Provider  clotrimazole (LOTRIMIN) 1 % cream Apply to affected area 2 times daily 06/17/20  Yes Evelena Leyden L, PA-C  fluconazole (DIFLUCAN) 200 MG tablet Take 1 tablet (200 mg total) by mouth daily for 14 days. 06/17/20 07/01/20 Yes Lorelee New, PA-C  albuterol (PROVENTIL HFA;VENTOLIN HFA) 108 (90 Base) MCG/ACT inhaler Inhale 1-2 puffs into the lungs every 6 (six) hours as needed for wheezing or shortness of breath. 07/13/15   [name], [name], PA-C  diphenhydramine-acetaminophen (TYLENOL PM) 25-500 MG TABS tablet Take 1 tablet by mouth at bedtime as needed (For pain).    [provider]  metoCLOPramide (REGLAN) 10 MG tablet Take 1 tablet (10 mg total) by mouth every 6 (six) hours. 04/12/19   [name], MD  pantoprazole (PROTONIX) 20 MG tablet Take 2 tablets (40 mg total) by mouth daily for 14 days. 04/12/19 04/26/19  [name], MD    Allergies    Penicillins  Review of Systems   Review of Systems  All other systems reviewed and are negative.   Physical Exam Updated Vital Signs BP (!) 128/94   Pulse 94   Temp 98.8 F (37.1 C) (Oral)   Resp 15   Ht 6' (1.829 m)   Wt 78 kg   SpO2 96%   BMI 23.33 kg/m   Physical Exam Vitals and nursing note reviewed. Exam conducted with a chaperone present.  Constitutional:      General: He is not in acute distress.    Appearance: Normal appearance. He is not ill-appearing.  HENT:     Head: Normocephalic and atraumatic.     Mouth/Throat:     Comments: White patches on tongue.  Patent oropharynx.   Eyes:     General: No scleral  icterus.    Conjunctiva/sclera: Conjunctivae normal.  Cardiovascular:     Rate and Rhythm: Normal rate.  Pulmonary:     Effort: Pulmonary effort is normal. No respiratory distress.  Genitourinary:    Penis: Normal.      Comments: Raw, moist, erythematous scrotum. No vesicular lesions noted. Scaling, white.  See picture.   Musculoskeletal:     Cervical back: Normal range of motion.  Skin:    General: Skin is dry.  Neurological:     Mental Status: He is alert.     GCS: GCS eye subscore is 4. GCS verbal subscore is 5. GCS motor subscore is 6.  Psychiatric:        Mood and Affect: Mood normal.        Behavior: Behavior normal.        Thought Content: Thought content normal.           ED Results / Procedures / Treatments   Labs (all labs ordered are listed, but only abnormal results are displayed) Labs Reviewed  CBG MONITORING, ED - Abnormal; Notable for the following components:      Result Value   Glucose-Capillary 108 (*)    All other components within normal limits  RAPID HIV SCREEN (HIV 1/2 AB+AG)    EKG None  Radiology No results found.  Procedures Procedures (including critical care time)  Medications Ordered in ED Medications  fluconazole (DIFLUCAN) tablet 200 mg (200 mg Oral Given 06/17/20 1719)    ED Course  I have reviewed the triage vital signs and the nursing notes.  Pertinent labs & imaging results that were available during my care of the patient were reviewed by me and considered in my medical decision making (see chart for details).    MDM Rules/Calculators/A&P                          Patient's history physical exam is suggestive of tinea cruris and oral candidiasis.  There is no obvious explanation for why he is experiencing this disease.  He does not use inhaled corticosteroids and he denies any history of DM or HIV.  Will obtain point-of-care blood glucose and HIV testing.  Lower suspicion for oral leukoplakia.  There are no vesicular  lesions otherwise concerning for herpes.  He reports that his symptoms are worse with the applied hydrocortisone cream which is consistent with fungal infection.  See pictures.  We will prescribe him Diflucan and clotrimazole cream.  Encouraging him to follow-up with his primary care provider for ongoing evaluation and management.  ED return precautions discussed.  Final Clinical Impression(s) / ED Diagnoses Final diagnoses:  Oral thrush  Tinea cruris    Rx / DC Orders  ED Discharge Orders         Ordered    fluconazole (DIFLUCAN) 200 MG tablet  Daily        06/17/20 1654    clotrimazole (LOTRIMIN) 1 % cream        06/17/20 1654           Lorelee New, PA-C 06/17/20 1731    Melene Plan, DO 06/17/20 2300

## 2020-06-27 ENCOUNTER — Encounter (HOSPITAL_COMMUNITY): Payer: Self-pay

## 2020-06-27 ENCOUNTER — Emergency Department (HOSPITAL_COMMUNITY)
Admission: EM | Admit: 2020-06-27 | Discharge: 2020-06-27 | Disposition: A | Discharge status: Home / Self Care | Payer: Self-pay | Attending: Emergency Medicine | Admitting: Emergency Medicine

## 2020-06-27 ENCOUNTER — Other Ambulatory Visit: Payer: Self-pay

## 2020-06-27 DIAGNOSIS — I2510 Atherosclerotic heart disease of native coronary artery without angina pectoris: Secondary | ICD-10-CM | POA: Insufficient documentation

## 2020-06-27 DIAGNOSIS — F17210 Nicotine dependence, cigarettes, uncomplicated: Secondary | ICD-10-CM | POA: Insufficient documentation

## 2020-06-27 DIAGNOSIS — H538 Other visual disturbances: Secondary | ICD-10-CM | POA: Insufficient documentation

## 2020-06-27 DIAGNOSIS — J45909 Unspecified asthma, uncomplicated: Secondary | ICD-10-CM | POA: Insufficient documentation

## 2020-06-27 DIAGNOSIS — Z202 Contact with and (suspected) exposure to infections with a predominantly sexual mode of transmission: Secondary | ICD-10-CM | POA: Insufficient documentation

## 2020-06-27 DIAGNOSIS — R519 Headache, unspecified: Secondary | ICD-10-CM | POA: Insufficient documentation

## 2020-06-27 HISTORY — DX: Unspecified asthma, uncomplicated: J45.909

## 2020-06-27 MED ORDER — DOXYCYCLINE HYCLATE 100 MG PO CAPS: 100.0000 mg | ORAL_CAPSULE | Freq: Two times a day (BID) | ORAL | 0 refills | Status: AC

## 2020-06-27 MED ORDER — DOXYCYCLINE HYCLATE 100 MG PO CAPS: 100.0000 mg | ORAL_CAPSULE | Freq: Two times a day (BID) | ORAL | 0 refills | Status: DC

## 2020-06-27 NOTE — Discharge Instructions (Addendum)
As discussed, you declined having a lumbar puncture today.  Please take the prescribed antibiotic as directed until finished.  You will need to follow-up with infectious disease, they will give you a call to make an appointment.  Have also provided the contact information.  Please call to schedule an appointment.  Return to the ER for any new or worsening symptoms.

## 2020-06-27 NOTE — ED Triage Notes (Signed)
Patient states he was told his last sexual partner had syphyllis. Patient states he had blisters on his penis and tongue, but they are now gone. Patient states that his tongue continues to burn. Patient also reports that his eyes stay red continuously and burn.

## 2020-06-27 NOTE — ED Provider Notes (Signed)
[facility]-EMERGENCY DEPT Provider Note   CSN: [id number] Arrival date & time: 06/27/20  3235     History Chief Complaint  Patient presents with  . STD check    Sean Martinez is a 60 y.o. male.  HPI 60 year old male with a history of asthma, CAD, tobacco use, MI presents to the ER with complaints of blurry vision, skin sensitivity, headache and burning in his throat when he eats x1 month.  Patient was seen here in the middle of November, was diagnosed with herpes.  He was then seen again on 12/28, and appeared to have some thrush and a fungal rash on his scrotum.  He was tested for HIV.  He was given antifungal medication, however the patient is not aware of this.  He states that he had some blisters and some white stuff on his tongue but this is all gotten better.  He denies any lesions on his penis.  No discharge.  No dysuria.  He endorses a cough but states this is been going on for years and she visits to a smoker cough.  He states that he wakes up every morning with a headache but this subsides throughout the day.  His male partner messaged him yesterday and told him that he tested positive for syphilis.  He is here to "find out was causing my symptoms".  Denies any fevers, chills, dysuria, penile discharge, back pain, abdominal pain, chest pain, shortness of breath    Past Medical History:  Diagnosis Date  . Asthma   . Coronary artery disease   . MI, old     Patient Active Problem List   Diagnosis Date Noted  . Chest wall pain 07/17/2012  . Hypotension, unspecified 05/31/2012  . Chest pain 05/30/2012  . Right groin pain 05/30/2012  . Diarrhea 05/30/2012  . Tobacco abuse 05/30/2012    Past Surgical History:  Procedure Laterality Date  . HERNIA REPAIR    . LEFT HEART CATHETERIZATION WITH CORONARY ANGIOGRAM N/A 07/17/2012   Procedure: LEFT HEART CATHETERIZATION WITH CORONARY ANGIOGRAM;  Surgeon: Pamella Pert, MD;  Location: [facility] CATH LAB;   Service: Cardiovascular;  Laterality: N/A;       Family History  Problem Relation Age of Onset  . Diabetes Mellitus II Mother     Social History   Tobacco Use  . Smoking status: Current Every Day Smoker    Packs/day: 0.25    Types: Cigarettes  . Smokeless tobacco: Never Used  Vaping Use  . Vaping Use: Never used  Substance Use Topics  . Alcohol use: Not Currently  . Drug use: Not Currently    Home Medications Prior to Admission medications   Medication Sig Start Date End Date Taking? Authorizing Provider  albuterol (PROVENTIL HFA;VENTOLIN HFA) 108 (90 Base) MCG/ACT inhaler Inhale 1-2 puffs into the lungs every 6 (six) hours as needed for wheezing or shortness of breath. 07/13/15   [name], Ace Gins, PA-C  clotrimazole (LOTRIMIN) 1 % cream Apply to affected area 2 times daily 06/17/20   Chilton Si, Sharion Settler, PA-C  diphenhydramine-acetaminophen (TYLENOL PM) 25-500 MG TABS tablet Take 1 tablet by mouth at bedtime as needed (For pain).    [provider]  doxycycline (VIBRAMYCIN) 100 MG capsule Take 1 capsule (100 mg total) by mouth 2 (two) times daily for 14 days. 06/27/20 07/11/20  Mare Ferrari, PA-C  fluconazole (DIFLUCAN) 200 MG tablet Take 1 tablet (200 mg total) by mouth daily for 14 days. 06/17/20 07/01/20  Corena Herter, PA-C  metoCLOPramide (REGLAN) 10 MG tablet Take 1 tablet (10 mg total) by mouth every 6 (six) hours. 04/12/19   Gareth Morgan, MD  pantoprazole (PROTONIX) 20 MG tablet Take 2 tablets (40 mg total) by mouth daily for 14 days. 04/12/19 04/26/19  Gareth Morgan, MD    Allergies    Penicillins  Review of Systems   Review of Systems  Constitutional: Negative for chills and fever.  HENT: Positive for sore throat. Negative for ear pain.   Eyes: Negative for pain and visual disturbance.  Respiratory: Positive for cough. Negative for shortness of breath.   Cardiovascular: Negative for chest pain and palpitations.  Gastrointestinal: Negative for  abdominal pain and vomiting.  Genitourinary: Negative for dysuria and hematuria.  Musculoskeletal: Negative for arthralgias and back pain.  Skin: Negative for color change and rash.  Neurological: Positive for headaches. Negative for seizures and syncope.  All other systems reviewed and are negative.   Physical Exam Updated Vital Signs BP 138/87 (BP Location: Left Arm)   Pulse 98   Temp 97.9 F (36.6 C) (Oral)   Resp 16   Ht 6' (1.829 m)   Wt 78 kg   SpO2 95%   BMI 23.33 kg/m   Physical Exam Vitals reviewed.  Constitutional:      General: He is not in acute distress.    Appearance: Normal appearance. He is not ill-appearing, toxic-appearing or diaphoretic.  HENT:     Head: Normocephalic and atraumatic.     Mouth/Throat:     Comments: Tongue with slight remnants of thrush on the lateral sides. Normal oropharynx, no erythema, uvula midline.  Eyes:     General:        Right eye: No discharge.        Left eye: No discharge.     Extraocular Movements: Extraocular movements intact.     Conjunctiva/sclera: Conjunctivae normal.  Pulmonary:     Effort: Pulmonary effort is normal.  Abdominal:     General: Abdomen is flat.  Genitourinary:    Penis: Normal.      Testes: Normal.  Musculoskeletal:        General: No swelling. Normal range of motion.     Cervical back: Normal range of motion.  Skin:    General: Skin is warm.  Neurological:     General: No focal deficit present.     Mental Status: He is alert and oriented to person, place, and time.     Sensory: No sensory deficit.     Motor: No weakness.     Comments: Mental Status:  Alert, thought content appropriate, able to give a coherent history. Speech fluent without evidence of aphasia. Able to follow 2 step commands without difficulty.  Cranial Nerves:  II: Peripheral visual fields grossly normal, pupils equal, round, reactive to light III,IV, VI: ptosis not present, extra-ocular motions intact bilaterally  V,VII:  smile symmetric, facial light touch sensation equal VIII: hearing grossly normal to voice  X: uvula elevates symmetrically  XI: bilateral shoulder shrug symmetric and strong XII: midline tongue extension without fassiculations Motor:  Normal tone. 5/5 strength of BUE and BLE major muscle groups including strong and equal grip strength and dorsiflexion/plantar flexion Sensory: light touch normal in all extremities. Cerebellar: normal finger-to-nose with bilateral upper extremities, Romberg sign absent Gait: normal gait and balance. Able to walk on toes and heels with ease.    Psychiatric:        Mood and Affect: Mood normal.  Behavior: Behavior normal.     ED Results / Procedures / Treatments   Labs (all labs ordered are listed, but only abnormal results are displayed) Labs Reviewed  RPR    EKG None  Radiology No results found.  Procedures Procedures (including critical care time)  Medications Ordered in ED Medications - No data to display  ED Course  I have reviewed the triage vital signs and the nursing notes.  Pertinent labs & imaging results that were available during my care of the patient were reviewed by me and considered in my medical decision making (see chart for details).    MDM Rules/Calculators/A&P                         60 year old male with complaints of blurry vision, skin sensitivity, burning throat, headache.  On arrival, well-appearing, vitals reassuring, afebrile.  No neurologic deficits on exam, normal penile exam.  No visible lesions.  He states that originally he had some blisters on his penis which have now subsided.    Patient with vague symptoms of headache, blurry vision, skin sensitivity.  Recently found out that his partner tested positive for syphilis. Reviewed HIV testing done here in the ER from December 28, this was negative. Offered blood testing for syphilis, we discussed possible neurosyphilis as a cause of his symptoms, offered  LP for further work-up but the patient declined.  Will send RPR,, will refer to ID.  Patient has a penicillin allergy, with reports of rash many years ago.  Will send with 7 days of doxycycline, will refer to infectious disease.  Low suspicion for intracranial bleed or stroke as a cause of his symptoms.  Stressed follow-up with infectious disease.  He voices understanding and is agreeable.\   This was a shared visit with my supervising physician Dr. Criss Alvine who independently saw and evaluated the patient & provided guidance in evaluation/management/disposition ,in agreement with care   Final Clinical Impression(s) / ED Diagnoses Final diagnoses:  Exposure to STD    Rx / DC Orders ED Discharge Orders         Ordered    doxycycline (VIBRAMYCIN) 100 MG capsule  2 times daily,   Status:  Discontinued        06/27/20 2023    Ambulatory referral to Infectious Disease        06/27/20 2024    doxycycline (VIBRAMYCIN) 100 MG capsule  2 times daily        06/27/20 2032           Leone Brand 06/27/20 2033    Pricilla Loveless, MD 06/28/20 1459

## 2020-06-28 LAB — RPR
RPR Ser Ql: REACTIVE — AB
RPR Titer: 1:128 {titer}

## 2020-06-30 LAB — T.PALLIDUM AB, TOTAL: T Pallidum Abs: REACTIVE — AB

## 2020-07-16 ENCOUNTER — Ambulatory Visit: Payer: Self-pay | Attending: Physician Assistant | Admitting: Physician Assistant

## 2020-07-16 ENCOUNTER — Encounter: Payer: Self-pay | Admitting: Physician Assistant

## 2020-07-16 ENCOUNTER — Other Ambulatory Visit: Payer: Self-pay

## 2020-07-16 ENCOUNTER — Other Ambulatory Visit: Payer: Self-pay | Admitting: Physician Assistant

## 2020-07-16 DIAGNOSIS — J9801 Acute bronchospasm: Secondary | ICD-10-CM

## 2020-07-16 DIAGNOSIS — A530 Latent syphilis, unspecified as early or late: Secondary | ICD-10-CM

## 2020-07-16 DIAGNOSIS — Z09 Encounter for follow-up examination after completed treatment for conditions other than malignant neoplasm: Secondary | ICD-10-CM

## 2020-07-16 MED ORDER — ALBUTEROL SULFATE HFA 108 (90 BASE) MCG/ACT IN AERS: 1.0000 | INHALATION_SPRAY | Freq: Four times a day (QID) | RESPIRATORY_TRACT | 2 refills | Status: DC | PRN

## 2020-07-16 MED FILL — ALBUTEROL SULFATE HFA 108 (: 108 (90 BAS | 25 days supply | Qty: 18 | Fill #0

## 2020-07-16 NOTE — Progress Notes (Signed)
Patient ID: Sean Martinez, male   DOB: 08-Oct-1960, 60 y.o.   MRN: [id number]   Virtual Visit via Telephone Note  I connected with Sean Martinez on 07/16/20 at  3:10 PM EST by telephone and verified that I am speaking with the correct person using two identifiers.  Location: Patient: home Provider: [facility] office   I discussed the limitations, risks, security and privacy concerns of performing an evaluation and management service by telephone and the availability of in person appointments. I also discussed with the patient that there may be a patient responsible charge related to this service. The patient expressed understanding and agreed to proceed.   History of Present Illness: After ED visit 06/27/2020.  Had positive reaction for RPR and trep.  Ab and health dept was notified.  He says they told him he did not need treatment of any kind.  Needs inhaler RF.  No new issues or concerns.  Sean Martinez is a 60 y.o. male.  HPI 60 year old male with a history of asthma, CAD, tobacco use, MI presents to the ER with complaints of blurry vision, skin sensitivity, headache and burning in his throat when he eats x1 month.  Patient was seen here in the middle of November, was diagnosed with herpes.  He was then seen again on 12/28, and appeared to have some thrush and a fungal rash on his scrotum.  He was tested for HIV.  He was given antifungal medication, however the patient is not aware of this.  He states that he had some blisters and some white stuff on his tongue but this is all gotten better.  He denies any lesions on his penis.  No discharge.  No dysuria.  He endorses a cough but states this is been going on for years and she visits to a smoker cough.  He states that he wakes up every morning with a headache but this subsides throughout the day.  His male partner messaged him yesterday and told him that he tested positive for syphilis.  He is here to "find out was causing my  symptoms".  Denies any fevers, chills, dysuria, penile discharge, back pain, abdominal pain, chest pain, shortness of breath   60 year old male with complaints of blurry vision, skin sensitivity, burning throat, headache.  On arrival, well-appearing, vitals reassuring, afebrile.  No neurologic deficits on exam, normal penile exam.  No visible lesions.  He states that originally he had some blisters on his penis which have now subsided.    Patient with vague symptoms of headache, blurry vision, skin sensitivity.  Recently found out that his partner tested positive for syphilis. Reviewed HIV testing done here in the ER from December 28, this was negative. Offered blood testing for syphilis, we discussed possible neurosyphilis as a cause of his symptoms, offered LP for further work-up but the patient declined.  Will send RPR,, will refer to ID.  Patient has a penicillin allergy, with reports of rash many years ago.  Will send with 7 days of doxycycline, will refer to infectious disease.  Low suspicion for intracranial bleed or stroke as a cause of his symptoms.  Stressed follow-up with infectious disease.  He voices understanding and is agreeable.\   Observations/Objective:  NAD.  A&Ox3   Assessment and Plan: 1. Bronchospasm - albuterol (VENTOLIN HFA) 108 (90 Base) MCG/ACT inhaler; Inhale 1-2 puffs into the lungs every 6 (six) hours as needed for wheezing or shortness of breath.  Dispense: 1 each; Refill: 2  2. Positive serology for syphilis Health dept indicated no treatment but will have him see ID to be sure.  He never remembers having any s/sx.   - Ambulatory referral to Infectious Disease  3. Encounter for examination following treatment at hospital Doing well    Follow Up Instructions: Assign PCP in 2-3 months   I discussed the assessment and treatment plan with the patient. The patient was provided an opportunity to ask questions and all were answered. The patient agreed with the plan  and demonstrated an understanding of the instructions.   The patient was advised to call back or seek an in-person evaluation if the symptoms worsen or if the condition fails to improve as anticipated.  I provided 15 minutes of non-face-to-face time during this encounter.   Georgian Co, PA-C

## 2020-07-21 ENCOUNTER — Telehealth: Payer: Self-pay | Admitting: *Deleted

## 2020-07-21 ENCOUNTER — Ambulatory Visit: Payer: Self-pay | Admitting: Internal Medicine

## 2020-07-21 NOTE — Telephone Encounter (Signed)
RN left message notifying patient of today's missed visit. Asked him to call back if he wanted to reschedule, or if he was having difficulty finding our office. Andree Coss, RN

## 2020-08-01 MED FILL — ALBUTEROL SULFATE HFA 108 (: 108 (90 BAS | 25 days supply | Qty: 18 | Fill #0

## 2020-08-05 MED FILL — ALBUTEROL SULFATE HFA 108 (: 108 (90 BAS | 25 days supply | Qty: 18 | Fill #1

## 2020-08-08 ENCOUNTER — Ambulatory Visit: Payer: Self-pay | Admitting: Internal Medicine

## 2020-09-16 ENCOUNTER — Ambulatory Visit: Payer: Self-pay | Admitting: *Deleted

## 2020-09-16 ENCOUNTER — Other Ambulatory Visit: Payer: Self-pay | Admitting: Physician Assistant

## 2020-09-16 ENCOUNTER — Other Ambulatory Visit: Payer: Self-pay | Admitting: Family Medicine

## 2020-09-16 DIAGNOSIS — J9801 Acute bronchospasm: Secondary | ICD-10-CM

## 2020-09-16 NOTE — Telephone Encounter (Signed)
Contacted pt regarding his request for Albuterol inhaler; his last refill was 09/04/20; the pt says he has been having shortness of breath with activitysince 09/06/20 and has been using his inhaler 5-6 times per day; the pt says he has a productive cough with white phlegm; see nurse triage note dated 09/16/20.  Requested medication (s) are due for refill today: yes  Requested medication (s) are on the active medication list: yes  Last refill:09/04/20  Future visit scheduled: no  Notes to clinic:  pharmacy note: "patient is taking more often due to pollen...please send updated rx...patient requesting refill 12 days early"    Requested Prescriptions  Pending Prescriptions Disp Refills   albuterol (VENTOLIN HFA) 108 (90 Base) MCG/ACT inhaler [Pharmacy Med Name: ALBUTEROL SULFATE HFA 108 ( 108 (90 BAS Aerosol] 18 g 2    Sig: Inhale 1-2 puffs into the lungs every 6 (six) hours as needed for wheezing or shortness of breath.      Pulmonology:  Beta Agonists Failed - 09/16/2020 10:39 AM      Failed - One inhaler should last at least one month. If the patient is requesting refills earlier, contact the patient to check for uncontrolled symptoms.      Passed - Valid encounter within last 12 months    Recent Outpatient Visits           2 months ago Bronchospasm   [facility] [place], [place], [place]

## 2020-09-16 NOTE — Telephone Encounter (Signed)
Contacted pt regarding his request for Albuterol inhaler; his last refill was 09/04/20; the pt says he has been having shortness of breath with activitysince 09/06/20 and has been using his inhaler 5-6 times per day; the pt says he has a productive cough with white phlegm; recommendations made per nurse triage protocol; he verbalized understanding and would like to be seen on 09/18/20; the pt was last seen by Denton Brick at [facility]; there is no availability; the pt can be contacted at [phone]; will route to office for final disposition; also see telephone encounter dated 09/16/20.   Reason for Disposition . [1] Longstanding difficulty breathing AND [2] not responding to usual therapy  Answer Assessment - Initial Assessment Questions 1. RESPIRATORY STATUS: "Describe your breathing?" (e.g., wheezing, shortness of breath, unable to speak, severe coughing)      Shortness of breath 2. ONSET: "When did this breathing problem begin?"     09/06/20 3. PATTERN "Does the difficult breathing come and go, or has it been constant since it started?"      intermittent 4. SEVERITY: "How bad is your breathing?" (e.g., mild, moderate, severe)    - MILD: No SOB at rest, mild SOB with walking, speaks normally in sentences, can lay down, no retractions, pulse < 100.    - MODERATE: SOB at rest, SOB with minimal exertion and prefers to sit, cannot lie down flat, speaks in phrases, mild retractions, audible wheezing, pulse 100-120.    - SEVERE: Very SOB at rest, speaks in single words, struggling to breathe, sitting hunched forward, retractions, pulse > 120     Mild to moderate 5. RECURRENT SYMPTOM: "Have you had difficulty breathing before?" If Yes, ask: "When was the last time?" and "What happened that time?"      Yes "this feels like asthma" 6. CARDIAC HISTORY: "Do you have any history of heart disease?" (e.g., heart attack, angina, bypass surgery, angioplasty)     Hx MI 7. LUNG HISTORY: "Do  you have any history of lung disease?"  (e.g., pulmonary embolus, asthma, emphysema)    asthma 8. CAUSE: "What do you think is causing the breathing problem?"      pollen 9. OTHER SYMPTOMS: "Do you have any other symptoms? (e.g., dizziness, runny nose, cough, chest pain, fever)     Cough,, runny nose from allergies 10. PREGNANCY: "Is there any chance you are pregnant?" "When was your last menstrual period?"       n/a 11. TRAVEL: "Have you traveled out of the country in the last month?" (e.g., travel history, exposures)       no  Protocols used: BREATHING DIFFICULTY-A-AH

## 2020-09-16 NOTE — Telephone Encounter (Deleted)
Contacted 5-6 times daily due to SOB since 09/06/20; it was filled 3/17/22l his sob is with activity; this refill had 100 pumps; he has a productive cough with white phlegm;

## 2020-09-17 NOTE — Telephone Encounter (Signed)
Please refer patient to mobile unit. 

## 2020-09-22 ENCOUNTER — Other Ambulatory Visit: Payer: Self-pay | Admitting: *Deleted

## 2020-09-22 DIAGNOSIS — J9801 Acute bronchospasm: Secondary | ICD-10-CM

## 2020-09-22 NOTE — Telephone Encounter (Signed)
Patient is calling to request RF on his inhaler - Albuterol that he picked up on 09/16/20- spoke to patient because he requested Rx RF too early- patient states he was babysitting his nephew and the child got hold of the inhaler and pumped it until it was out. Advised if he uses insurance- Rx may not cover- he is aware. Request sent to office.

## 2020-09-22 NOTE — Telephone Encounter (Signed)
Called Pt no answer left vm for Pt to call [phone] to get the Mobile Unit location on the day he would like to go.

## 2020-09-22 NOTE — Telephone Encounter (Signed)
Requested medication (s) are due for refill today- no  Requested medication (s) are on the active medication list -yes  Future visit scheduled -no  Last refill: 09/16/20  Notes to clinic: Patient requesting replacement inhaler- child played with it and emptied the inhaler. Patient advised would send request to provider for review   Requested Prescriptions  Pending Prescriptions Disp Refills   albuterol (VENTOLIN HFA) 108 (90 Base) MCG/ACT inhaler 18 g 0    Sig: INHALE 1-2 PUFFS INTO THE LUNGS EVERY 6 (SIX) HOURS AS NEEDED FOR WHEEZING OR SHORTNESS OF BREATH.      Pulmonology:  Beta Agonists Failed - 09/22/2020 12:41 PM      Failed - One inhaler should last at least one month. If the patient is requesting refills earlier, contact the patient to check for uncontrolled symptoms.      Passed - Valid encounter within last 12 months    Recent Outpatient Visits           2 months ago Bronchospasm   [facility] [place], [place], [place]                    Requested Prescriptions  Pending Prescriptions Disp Refills   albuterol (VENTOLIN HFA) 108 (90 Base) MCG/ACT inhaler 18 g 0    Sig: INHALE 1-2 PUFFS INTO THE LUNGS EVERY 6 (SIX) HOURS AS NEEDED FOR WHEEZING OR SHORTNESS OF BREATH.      Pulmonology:  Beta Agonists Failed - 09/22/2020 12:41 PM      Failed - One inhaler should last at least one month. If the patient is requesting refills earlier, contact the patient to check for uncontrolled symptoms.      Passed - Valid encounter within last 12 months    Recent Outpatient Visits           2 months ago Bronchospasm   [facility] [place], [place], [place]

## 2020-09-23 ENCOUNTER — Other Ambulatory Visit: Payer: Self-pay

## 2020-09-23 MED ORDER — ALBUTEROL SULFATE HFA 108 (90 BASE) MCG/ACT IN AERS
1.0000 | INHALATION_SPRAY | Freq: Four times a day (QID) | RESPIRATORY_TRACT | 0 refills | Status: DC | PRN
  Filled 2020-09-23 – 2020-10-28 (×3): qty 18, 25d supply, fill #0

## 2020-10-01 ENCOUNTER — Other Ambulatory Visit: Payer: Self-pay

## 2020-10-28 ENCOUNTER — Other Ambulatory Visit: Payer: Self-pay

## 2020-10-29 ENCOUNTER — Other Ambulatory Visit: Payer: Self-pay

## 2020-11-20 ENCOUNTER — Other Ambulatory Visit: Payer: Self-pay

## 2020-11-20 ENCOUNTER — Other Ambulatory Visit: Payer: Self-pay | Admitting: Family Medicine

## 2020-11-20 DIAGNOSIS — J9801 Acute bronchospasm: Secondary | ICD-10-CM

## 2020-11-20 NOTE — Telephone Encounter (Signed)
Requested medication (s) are due for refill today: yes  Requested medication (s) are on the active medication list: yes  Last refill:  09/23/20-09/23/21 #18g 0 refills  Future visit scheduled: no  Notes to clinic:  [facility]     Requested Prescriptions  Pending Prescriptions Disp Refills   albuterol (VENTOLIN HFA) 108 (90 Base) MCG/ACT inhaler 18 g 0    Sig: INHALE 1-2 PUFFS INTO THE LUNGS EVERY 6 (SIX) HOURS AS NEEDED FOR WHEEZING OR SHORTNESS OF BREATH.      Pulmonology:  Beta Agonists Failed - 11/20/2020 10:31 AM      Failed - One inhaler should last at least one month. If the patient is requesting refills earlier, contact the patient to check for uncontrolled symptoms.      Passed - Valid encounter within last 12 months    Recent Outpatient Visits           4 months ago Bronchospasm   [facility] [place], [place], [place]

## 2020-11-21 ENCOUNTER — Other Ambulatory Visit: Payer: Self-pay

## 2020-11-21 MED ORDER — ALBUTEROL SULFATE HFA 108 (90 BASE) MCG/ACT IN AERS
1.0000 | INHALATION_SPRAY | Freq: Four times a day (QID) | RESPIRATORY_TRACT | 0 refills | Status: DC | PRN
  Filled 2020-11-21 (×2): qty 18, 25d supply, fill #0

## 2020-11-27 ENCOUNTER — Other Ambulatory Visit: Payer: Self-pay

## 2020-12-11 ENCOUNTER — Other Ambulatory Visit: Payer: Self-pay

## 2020-12-11 ENCOUNTER — Other Ambulatory Visit: Payer: Self-pay | Admitting: Family Medicine

## 2020-12-11 DIAGNOSIS — J9801 Acute bronchospasm: Secondary | ICD-10-CM

## 2020-12-11 MED ORDER — ALBUTEROL SULFATE HFA 108 (90 BASE) MCG/ACT IN AERS
1.0000 | INHALATION_SPRAY | Freq: Four times a day (QID) | RESPIRATORY_TRACT | 0 refills | Status: DC | PRN
  Filled 2020-12-11: qty 18, 25d supply, fill #0

## 2020-12-16 ENCOUNTER — Other Ambulatory Visit: Payer: Self-pay

## 2020-12-25 ENCOUNTER — Other Ambulatory Visit: Payer: Self-pay | Admitting: Family Medicine

## 2020-12-25 ENCOUNTER — Other Ambulatory Visit: Payer: Self-pay

## 2020-12-25 DIAGNOSIS — J9801 Acute bronchospasm: Secondary | ICD-10-CM

## 2020-12-25 NOTE — Telephone Encounter (Signed)
  Notes to clinic:  patient states he is having to use more often....   Requested Prescriptions  Pending Prescriptions Disp Refills   albuterol (VENTOLIN HFA) 108 (90 Base) MCG/ACT inhaler 18 g 0    Sig: INHALE 1-2 PUFFS INTO THE LUNGS EVERY 6 (SIX) HOURS AS NEEDED FOR WHEEZING OR SHORTNESS OF BREATH.      Pulmonology:  Beta Agonists Failed - 12/25/2020 11:13 AM      Failed - One inhaler should last at least one month. If the patient is requesting refills earlier, contact the patient to check for uncontrolled symptoms.      Passed - Valid encounter within last 12 months    Recent Outpatient Visits           5 months ago Bronchospasm   [facility] [place], [place], [place]

## 2020-12-26 ENCOUNTER — Other Ambulatory Visit: Payer: Self-pay

## 2020-12-30 ENCOUNTER — Other Ambulatory Visit: Payer: Self-pay

## 2021-01-05 ENCOUNTER — Other Ambulatory Visit: Payer: Self-pay

## 2021-02-02 ENCOUNTER — Other Ambulatory Visit: Payer: Self-pay

## 2021-02-02 ENCOUNTER — Other Ambulatory Visit: Payer: Self-pay | Admitting: Family Medicine

## 2021-02-02 DIAGNOSIS — J9801 Acute bronchospasm: Secondary | ICD-10-CM

## 2021-02-02 NOTE — Telephone Encounter (Signed)
Requested medication (s) are on the active medication list:  yes   Last refill:  12/16/2020  Future visit scheduled: no  Notes to clinic:  One inhaler should last at least one month. If the patient is requesting refills earlier, contact the patient to check for uncontrolled symptoms   Requested Prescriptions  Pending Prescriptions Disp Refills   albuterol (VENTOLIN HFA) 108 (90 Base) MCG/ACT inhaler 18 g 0    Sig: INHALE 1-2 PUFFS INTO THE LUNGS EVERY 6 (SIX) HOURS AS NEEDED FOR WHEEZING OR SHORTNESS OF BREATH.     Pulmonology:  Beta Agonists Failed - 02/02/2021 11:32 AM      Failed - One inhaler should last at least one month. If the patient is requesting refills earlier, contact the patient to check for uncontrolled symptoms.      Passed - Valid encounter within last 12 months    Recent Outpatient Visits           6 months ago Bronchospasm   [facility] [place], [place], [place]

## 2021-02-03 ENCOUNTER — Other Ambulatory Visit: Payer: Self-pay

## 2021-02-04 ENCOUNTER — Other Ambulatory Visit: Payer: Self-pay

## 2021-04-07 ENCOUNTER — Ambulatory Visit (HOSPITAL_BASED_OUTPATIENT_CLINIC_OR_DEPARTMENT_OTHER): Payer: Self-pay | Admitting: Nurse Practitioner

## 2021-05-08 ENCOUNTER — Ambulatory Visit (HOSPITAL_BASED_OUTPATIENT_CLINIC_OR_DEPARTMENT_OTHER): Payer: BC Managed Care – PPO | Admitting: Nurse Practitioner

## 2021-05-08 NOTE — Progress Notes (Deleted)
Tollie Eth, DNP, AGNP-c Primary Care & Sports Medicine [address]  [address] [place], [place] [address] [phone]  New patient visit   Patient: Sean Martinez   DOB: 10-Jan-1961   60 y.o. Male  MRN: [id number] Visit Date: 05/08/2021  Patient Care Team: Patient, No Pcp Per (Inactive) as PCP - General (General Practice)  Today's healthcare provider: Tollie Eth, NP   No chief complaint on file.  Subjective    Sean Martinez is a 60 y.o. male who presents today as a new patient to establish care.  HPI  HIs last PCP was *** His last CPE was ***  Past Medical History:  Diagnosis Date   Asthma    Coronary artery disease    MI, old    Past Surgical History:  Procedure Laterality Date   HERNIA REPAIR     LEFT HEART CATHETERIZATION WITH CORONARY ANGIOGRAM N/A 07/17/2012   Procedure: LEFT HEART CATHETERIZATION WITH CORONARY ANGIOGRAM;  Surgeon: Pamella Pert, MD;  Location: [facility] CATH LAB;  Service: Cardiovascular;  Laterality: N/A;   Family Status  Relation Name Status   Mother  Deceased   Father  Alive   Family History  Problem Relation Age of Onset   Diabetes Mellitus II Mother    Social History   Socioeconomic History   Marital status: Single    Spouse name: Not on file   Number of children: Not on file   Years of education: Not on file   Highest education level: Not on file  Occupational History   Not on file  Tobacco Use   Smoking status: Every Day    Packs/day: 0.25    Types: Cigarettes   Smokeless tobacco: Never  Vaping Use   Vaping Use: Never used  Substance and Sexual Activity   Alcohol use: Not Currently   Drug use: Not Currently   Sexual activity: Not on file  Other Topics Concern   Not on file  Social History Narrative   Not on file   Social Determinants of Health   Financial Resource Strain: Not on file  Food Insecurity: Not on file  Transportation Needs: Not on file  Physical Activity: Not on  file  Stress: Not on file  Social Connections: Not on file   Outpatient Medications Prior to Visit  Medication Sig   albuterol (VENTOLIN HFA) 108 (90 Base) MCG/ACT inhaler INHALE 1-2 PUFFS INTO THE LUNGS EVERY 6 (SIX) HOURS AS NEEDED FOR WHEEZING OR SHORTNESS OF BREATH.   clotrimazole (LOTRIMIN) 1 % cream Apply to affected area 2 times daily   diphenhydramine-acetaminophen (TYLENOL PM) 25-500 MG TABS tablet Take 1 tablet by mouth at bedtime as needed (For pain).   metoCLOPramide (REGLAN) 10 MG tablet Take 1 tablet (10 mg total) by mouth every 6 (six) hours.   pantoprazole (PROTONIX) 20 MG tablet Take 2 tablets (40 mg total) by mouth daily for 14 days.   No facility-administered medications prior to visit.   Allergies  Allergen Reactions   Penicillins Hives    Has patient had a PCN reaction causing immediate rash, facial/tongue/throat swelling, SOB or lightheadedness with hypotension: Yes Has patient had a PCN reaction causing severe rash involving mucus membranes or skin necrosis: No Has patient had a PCN reaction that required hospitalization: No Has patient had a PCN reaction occurring within the last 10 years: No If all of the above answers are "NO", then may proceed with Cephalosporin use.     There  is no immunization history for the selected administration types on file for this patient.  Health Maintenance  Topic Date Due   COVID-19 Vaccine (1) Never done   Pneumococcal Vaccine 64-61 Years old (1 - PCV) Never done   Hepatitis C Screening  Never done   TETANUS/TDAP  Never done   COLONOSCOPY (Pts 45-77yrs Insurance coverage will need to be confirmed)  Never done   Zoster Vaccines- Shingrix (1 of 2) Never done   INFLUENZA VACCINE  Never done   HIV Screening  Completed   HPV VACCINES  Aged Out    Patient Care Team: Patient, No Pcp Per (Inactive) as PCP - General (General Practice)  Review of Systems All review of systems negative except what is listed in the HPI     Objective    There were no vitals taken for this visit. Physical Exam ***  Depression Screen No flowsheet data found. No results found for any visits on 05/08/21.  Assessment & Plan      Problem List Items Addressed This Visit   None    No follow-ups on file.      Eyvette Cordon, Sung Amabile, NP, DNP, AGNP-C Primary Care & Sports Medicine at [facility]

## 2021-05-25 ENCOUNTER — Encounter (HOSPITAL_BASED_OUTPATIENT_CLINIC_OR_DEPARTMENT_OTHER): Payer: Self-pay | Admitting: Nurse Practitioner

## 2021-10-05 ENCOUNTER — Ambulatory Visit (HOSPITAL_BASED_OUTPATIENT_CLINIC_OR_DEPARTMENT_OTHER): Payer: BC Managed Care – PPO | Admitting: Nurse Practitioner

## 2021-10-23 ENCOUNTER — Ambulatory Visit (HOSPITAL_BASED_OUTPATIENT_CLINIC_OR_DEPARTMENT_OTHER): Payer: BC Managed Care – PPO | Admitting: Nurse Practitioner

## 2021-11-10 ENCOUNTER — Encounter (HOSPITAL_BASED_OUTPATIENT_CLINIC_OR_DEPARTMENT_OTHER): Payer: Self-pay | Admitting: Nurse Practitioner

## 2022-03-17 ENCOUNTER — Other Ambulatory Visit: Payer: Self-pay

## 2022-05-03 ENCOUNTER — Emergency Department (HOSPITAL_COMMUNITY): Payer: Self-pay

## 2022-05-03 ENCOUNTER — Emergency Department (HOSPITAL_COMMUNITY)
Admission: EM | Admit: 2022-05-03 | Discharge: 2022-05-03 | Disposition: A | Discharge status: Home / Self Care | Payer: Self-pay | Attending: Emergency Medicine | Admitting: Emergency Medicine

## 2022-05-03 ENCOUNTER — Other Ambulatory Visit: Payer: Self-pay

## 2022-05-03 DIAGNOSIS — F17210 Nicotine dependence, cigarettes, uncomplicated: Secondary | ICD-10-CM | POA: Insufficient documentation

## 2022-05-03 DIAGNOSIS — R0602 Shortness of breath: Secondary | ICD-10-CM | POA: Insufficient documentation

## 2022-05-03 DIAGNOSIS — R0789 Other chest pain: Secondary | ICD-10-CM | POA: Insufficient documentation

## 2022-05-03 DIAGNOSIS — I10 Essential (primary) hypertension: Secondary | ICD-10-CM | POA: Insufficient documentation

## 2022-05-03 DIAGNOSIS — R052 Subacute cough: Secondary | ICD-10-CM | POA: Insufficient documentation

## 2022-05-03 LAB — CBC WITH DIFFERENTIAL/PLATELET
Abs Immature Granulocytes: 0.01 10*3/uL (ref 0.00–0.07)
Basophils Absolute: 0 10*3/uL (ref 0.0–0.1)
Basophils Relative: 0 %
Eosinophils Absolute: 0 10*3/uL (ref 0.0–0.5)
Eosinophils Relative: 1 %
HCT: 46.5 % (ref 39.0–52.0)
Hemoglobin: 16.4 g/dL (ref 13.0–17.0)
Immature Granulocytes: 0 %
Lymphocytes Relative: 58 %
Lymphs Abs: 2.7 10*3/uL (ref 0.7–4.0)
MCH: 33.1 pg (ref 26.0–34.0)
MCHC: 35.3 g/dL (ref 30.0–36.0)
MCV: 93.9 fL (ref 80.0–100.0)
Monocytes Absolute: 0.3 10*3/uL (ref 0.1–1.0)
Monocytes Relative: 7 %
Neutro Abs: 1.6 10*3/uL — ABNORMAL LOW (ref 1.7–7.7)
Neutrophils Relative %: 34 %
Platelets: 168 10*3/uL (ref 150–400)
RBC: 4.95 MIL/uL (ref 4.22–5.81)
RDW: 12.9 % (ref 11.5–15.5)
WBC: 4.7 10*3/uL (ref 4.0–10.5)
nRBC: 0 % (ref 0.0–0.2)

## 2022-05-03 LAB — BASIC METABOLIC PANEL
Anion gap: 8 (ref 5–15)
BUN: 13 mg/dL (ref 6–20)
CO2: 26 mmol/L (ref 22–32)
Calcium: 9 mg/dL (ref 8.9–10.3)
Chloride: 101 mmol/L (ref 98–111)
Creatinine, Ser: 0.79 mg/dL (ref 0.61–1.24)
GFR, Estimated: >60 mL/min (ref >60)
Glucose, Bld: 168 mg/dL — ABNORMAL HIGH (ref 70–99)
Potassium: 4.3 mmol/L (ref 3.5–5.1)
Sodium: 135 mmol/L (ref 135–145)

## 2022-05-03 LAB — TROPONIN I (HIGH SENSITIVITY)
Troponin I (High Sensitivity): 6 ng/L (ref <18)
Troponin I (High Sensitivity): 6 ng/L (ref <18)

## 2022-05-03 MED ORDER — ALBUTEROL SULFATE HFA 108 (90 BASE) MCG/ACT IN AERS
2.0000 | INHALATION_SPRAY | Freq: Four times a day (QID) | RESPIRATORY_TRACT | Status: DC
  Administered 2022-05-03: 2 via RESPIRATORY_TRACT
  Filled 2022-05-03: qty 6.7

## 2022-05-03 MED ORDER — PREDNISONE 20 MG PO TABS: 40.0000 mg | ORAL_TABLET | Freq: Every day | ORAL | 0 refills | Status: DC

## 2022-05-03 NOTE — ED Triage Notes (Signed)
EMS stated, walking dog and had chest pain that was sharp , had a "mild" heart attack 4 years ago.  Had Viagra yesterday.  18g IV left AC ASA 324

## 2022-05-03 NOTE — ED Provider Notes (Signed)
[facility] EMERGENCY DEPARTMENT Provider Note   CSN: [id number] Arrival date & time: 05/03/22  4259     History  Chief Complaint  Patient presents with   Shortness of Breath   Chest Pain    Sean Martinez is a 61 y.o. male.  HPI Patient presents with his parents who assist with the history.  Patient has multiple medical issues, including cigarette addiction, prior MI.  He notes that he has episodes of chest pain about once a week, but today, 6 hours prior to my evaluation the patient had an episode of chest pain that was slightly more severe than usual the pain was in the same area, however.  Currently he is asymptomatic.  He notes that he continues to smoke cigarettes, and ran out of his albuterol yesterday.  He has no formal diagnosis of COPD.  He has not seen a cardiologist in some time, has no primary care physician either.    Home Medications Prior to Admission medications   Medication Sig Start Date End Date Taking? Authorizing Provider  predniSONE (DELTASONE) 20 MG tablet Take 2 tablets (40 mg total) by mouth daily with breakfast. For the next four days 05/03/22  Yes Gerhard Munch, MD  albuterol (VENTOLIN HFA) 108 (90 Base) MCG/ACT inhaler INHALE 1-2 PUFFS INTO THE LUNGS EVERY 6 (SIX) HOURS AS NEEDED FOR WHEEZING OR SHORTNESS OF BREATH. 12/11/20 12/11/21  Hoy Register, MD  clotrimazole (LOTRIMIN) 1 % cream Apply to affected area 2 times daily 06/17/20   Lorelee New, PA-C  diphenhydramine-acetaminophen (TYLENOL PM) 25-500 MG TABS tablet Take 1 tablet by mouth at bedtime as needed (For pain).    [provider]  metoCLOPramide (REGLAN) 10 MG tablet Take 1 tablet (10 mg total) by mouth every 6 (six) hours. 04/12/19   [name], MD  pantoprazole (PROTONIX) 20 MG tablet Take 2 tablets (40 mg total) by mouth daily for 14 days. 04/12/19 04/26/19  [name], MD      Allergies    Penicillins    Review of Systems   Review of  Systems  Respiratory:  Positive for cough.   All other systems reviewed and are negative.   Physical Exam Updated Vital Signs BP (!) 155/105   Pulse 78   Temp 98 F (36.7 C) (Oral)   Resp (!) 21   Ht 6' (1.829 m)   Wt 81.2 kg   SpO2 95%   BMI 24.28 kg/m  Physical Exam Vitals and nursing note reviewed.  Constitutional:      General: He is not in acute distress.    Appearance: He is well-developed.  HENT:     Head: Normocephalic and atraumatic.  Eyes:     Conjunctiva/sclera: Conjunctivae normal.  Cardiovascular:     Rate and Rhythm: Normal rate and regular rhythm.  Pulmonary:     Effort: Pulmonary effort is normal. No respiratory distress.     Breath sounds: No stridor.  Abdominal:     General: There is no distension.  Skin:    General: Skin is warm and dry.  Neurological:     Mental Status: He is alert and oriented to person, place, and time.     ED Results / Procedures / Treatments   Labs (all labs ordered are listed, but only abnormal results are displayed) Labs Reviewed  CBC WITH DIFFERENTIAL/PLATELET - Abnormal; Notable for the following components:      Result Value   Neutro Abs 1.6 (*)    All  other components within normal limits  BASIC METABOLIC PANEL - Abnormal; Notable for the following components:   Glucose, Bld 168 (*)    All other components within normal limits  TROPONIN I (HIGH SENSITIVITY)  TROPONIN I (HIGH SENSITIVITY)    EKG EKG Interpretation  Date/Time:  Monday May 03 2022 09:11:38 EST Ventricular Rate:  89 PR Interval:  158 QRS Duration: 90 QT Interval:  364 QTC Calculation: 442 R Axis:   82 Text Interpretation: Normal sinus rhythm Normal ECG Confirmed by Gerhard Munch [phone]) on 05/03/2022 4:57:58 PM  Radiology DG Chest 2 View  Result Date: 05/03/2022 CLINICAL DATA:  Chest pain. EXAM: CHEST - 2 VIEW COMPARISON:  February 06, 2018. FINDINGS: The heart size and mediastinal contours are within normal limits. Both lungs are  clear. The visualized skeletal structures are unremarkable. IMPRESSION: No active cardiopulmonary disease. Electronically Signed   By: Lupita Raider M.D.   On: 05/03/2022 09:42    Procedures Procedures    Medications Ordered in ED Medications  albuterol (VENTOLIN HFA) 108 (90 Base) MCG/ACT inhaler 2 puff (2 puffs Inhalation Given 05/03/22 1628)    ED Course/ Medical Decision Making/ A&P This patient with a Hx of multiple medical issues including MI, tobacco abuse, hypertension presents to the ED for concern of chest pain, this involves an extensive number of treatment options, and is a complaint that carries with it a high risk of complications and morbidity.    The differential diagnosis includes COPD exacerbation, unstable angina, ACS, musculoskeletal   Social Determinants of Health:  Cigarette addiction  Additional history obtained:  Additional history and/or information obtained from parents at bedside, notable for details of HPI   After the initial evaluation, orders, including: Labs monitoring were initiated.   Patient placed on Cardiac and Pulse-Oximetry Monitors. The patient was maintained on a cardiac monitor.  The cardiac monitored showed an rhythm of 80 sinus normal The patient was also maintained on pulse oximetry. The readings were typically 100% room air normal   On repeat evaluation of the patient stayed the same  Lab Tests:  I personally interpreted labs.  The pertinent results include: 2 normal troponin  Imaging Studies ordered:  I independently visualized and interpreted imaging which showed unremarkable chest x-ray I agree with the radiologist interpretation   Dispostion / Final MDM:  After consideration of the diagnostic results and the patient's response to treatment, this adult male with multiple medical issues most notably hypertension, cigarette addiction and history of MI now presents with chest pain.  That today's pain was slightly more  substantial than prior and there were symptoms concern for unstable angina, the patient is awake, alert, in no distress, afebrile, only minimally hypertensive.  Patient does have elevated risk profile, with 2 normal troponin, suspicion for his absence of albuterol, and smoking addiction contributing to his symptoms, patient is comfortable with, appropriate for discharge, the hospitalization was a consideration.  Patient will follow-up with our cardiologists, will start steroids, and albuterol.  Outpatient evaluation for his episodic chest pain, smoking addiction and ongoing cough reasonable, patient amenable to plan.  Final Clinical Impression(s) / ED Diagnoses Final diagnoses:  Atypical chest pain  Subacute cough  Chest wall pain  Other chest pain    Rx / DC Orders ED Discharge Orders          Ordered    Ambulatory referral to Cardiology       Comments: If you have not heard from the Cardiology office within the next  72 hours please call [phone].   05/03/22 1704    predniSONE (DELTASONE) 20 MG tablet  Daily with breakfast        05/03/22 1705              Gerhard Munch, MD 05/03/22 [place] Fruit

## 2022-05-03 NOTE — ED Provider Triage Note (Signed)
Emergency Medicine Provider Triage Evaluation Note  Sean Martinez , a 61 y.o. male  was evaluated in triage.  Pt complains of CP while walking dogs this am. Rates a 6/10. Hx of MI 4 years ago. No meds at home. No radiation of pain. Took Viagra yesterday. No Nitro with EMS. Got ASA with EMS. No hx of CHF, PND, orthopnea, LE swelling or pain  Review of Systems  Positive: CP Negative: Le swelling, fever, cough  Physical Exam  BP (!) 145/93 (BP Location: Right Arm)   Pulse 85   Temp 98 F (36.7 C)   Resp 18   SpO2 93%  Gen:   Awake, no distress   Resp:  Normal effort  MSK:   Moves extremities without difficulty, no LE swelling Other:    Medical Decision Making  Medically screening exam initiated at 9:18 AM.  Appropriate orders placed.  Tajai Ihde was informed that the remainder of the evaluation will be completed by another provider, this initial triage assessment does not replace that evaluation, and the importance of remaining in the ED until their evaluation is complete.  CP   Luiz Trumpower A, PA-C 05/03/22 3151

## 2022-05-03 NOTE — Discharge Instructions (Signed)
As discussed, today's evaluation has been generally reassuring.  However, with your history of prior heart attack, and your ongoing smoking addiction it is important to follow-up with our cardiologists, and establish care with a primary care physician.  Please use the provided medication as discussed: Albuterol every 4 hours for the next 2 days, then as needed, and the prescribed steroids.  Return here for concerning changes in your condition.

## 2022-05-12 ENCOUNTER — Ambulatory Visit: Payer: Self-pay | Admitting: Internal Medicine

## 2022-05-18 NOTE — Progress Notes (Deleted)
Cardiology Office Note:    Date:  05/18/2022   ID:  Sean Martinez, DOB 15-Jul-1960, MRN [id number]  PCP:  Patient, No Pcp Per   [facility] Providers Cardiologist:  None { Click to update primary MD,subspecialty MD or APP then REFRESH:1}    Referring MD: Gerhard Munch, MD   No chief complaint on file. ***  History of Present Illness:    Sean Martinez is a 61 y.o. male is seen at the request of Dr Jeraldine Loots for evaluation of chest pain. He has a history of chest pain previously seen by Dr Jacinto Halim in 2014. Echo showed EF 45-50%. Cardiac cath done showing normal coronary anatomy and normal LV function.    Past Medical History:  Diagnosis Date   Asthma    Coronary artery disease    MI, old     Past Surgical History:  Procedure Laterality Date   HERNIA REPAIR     LEFT HEART CATHETERIZATION WITH CORONARY ANGIOGRAM N/A 07/17/2012   Procedure: LEFT HEART CATHETERIZATION WITH CORONARY ANGIOGRAM;  Surgeon: Pamella Pert, MD;  Location: [facility] CATH LAB;  Service: Cardiovascular;  Laterality: N/A;    Current Medications: No outpatient medications have been marked as taking for the 05/20/22 encounter (Appointment) with [place], Jamiel Goncalves M, MD.     Allergies:   Penicillins   Social History   Socioeconomic History   Marital status: Single    Spouse name: Not on file   Number of children: Not on file   Years of education: Not on file   Highest education level: Not on file  Occupational History   Not on file  Tobacco Use   Smoking status: Every Day    Packs/day: 0.25    Types: Cigarettes   Smokeless tobacco: Never  Vaping Use   Vaping Use: Never used  Substance and Sexual Activity   Alcohol use: Not Currently   Drug use: Not Currently   Sexual activity: Not on file  Other Topics Concern   Not on file  Social History Narrative   Not on file   Social Determinants of Health   Financial Resource Strain: Not on file  Food Insecurity: Not on file   Transportation Needs: Not on file  Physical Activity: Not on file  Stress: Not on file  Social Connections: Not on file     Family History: The patient's ***family history includes Diabetes Mellitus II in his mother.  ROS:   Please see the history of present illness.    *** All other systems reviewed and are negative.  EKGs/Labs/Other Studies Reviewed:    The following studies were reviewed today: Echo 05/31/12: Study Conclusions   Left ventricle: The cavity size was normal. Wall thickness  was normal. Systolic function was mildly reduced. The  estimated ejection fraction was in the range of 45% to 50%.    EKG:  EKG is *** ordered today.  The ekg ordered today demonstrates ***  Recent Labs: 05/03/2022: BUN 13; Creatinine, Ser 0.79; Hemoglobin 16.4; Platelets 168; Potassium 4.3; Sodium 135  Recent Lipid Panel No results found for: "CHOL", "TRIG", "HDL", "CHOLHDL", "VLDL", "LDLCALC", "LDLDIRECT"   Risk Assessment/Calculations:   {Does this patient have ATRIAL FIBRILLATION?[phone]}  No BP recorded.  {Refresh Note OR Click here to enter BP  :1}***         Physical Exam:    VS:  There were no vitals taken for this visit.    Wt Readings from Last 3 Encounters:  05/03/22 179 lb (  81.2 kg)  06/27/20 172 lb (78 kg)  06/17/20 172 lb (78 kg)     GEN: *** Well nourished, well developed in no acute distress HEENT: Normal NECK: No JVD; No carotid bruits LYMPHATICS: No lymphadenopathy CARDIAC: ***RRR, no murmurs, rubs, gallops RESPIRATORY:  Clear to auscultation without rales, wheezing or rhonchi  ABDOMEN: Soft, non-tender, non-distended MUSCULOSKELETAL:  No edema; No deformity  SKIN: Warm and dry NEUROLOGIC:  Alert and oriented x 3 PSYCHIATRIC:  Normal affect   ASSESSMENT:    No diagnosis found. PLAN:    In order of problems listed above:  ***      {Are you ordering a CV Procedure (e.g. stress test, cath, DCCV, TEE, etc)?   Press F2        :[id number]     Medication Adjustments/Labs and Tests Ordered: Current medicines are reviewed at length with the patient today.  Concerns regarding medicines are outlined above.  No orders of the defined types were placed in this encounter.  No orders of the defined types were placed in this encounter.   There are no Patient Instructions on file for this visit.   Signed, Makenly Larabee Martin[place], MD  05/18/2022 3:12 PM    [place]

## 2022-05-20 ENCOUNTER — Ambulatory Visit: Payer: Self-pay | Attending: Internal Medicine | Admitting: Cardiology

## 2022-06-22 NOTE — Progress Notes (Deleted)
Cardiology Office Note:    Date:  06/22/2022   ID:  Sean Martinez, DOB [birth year], MRN [id number]  PCP:  Patient, No Pcp Per   [facility] Providers Cardiologist:  None { Click to update primary MD,subspecialty MD or APP then REFRESH:1}    Referring MD: Carmin Muskrat, MD   No chief complaint on file. ***  History of Present Illness:    Sean Martinez is a 62 y.o. male is seen at the request of Dr Vanita Panda for evaluation of chest pain. He had remote cardiac evaluation in 2014 by Dr Einar Gip. Cardiac cath and Echo at that time were normal. Recently seen in ED with complaints of chest pain and dyspnea. Ecg was normal. Troponin normal x 2. Patient does have history of chronic tobacco use.   Past Medical History:  Diagnosis Date   Asthma    Coronary artery disease    MI, old     Past Surgical History:  Procedure Laterality Date   HERNIA REPAIR     LEFT HEART CATHETERIZATION WITH CORONARY ANGIOGRAM N/A 07/17/2012   Procedure: LEFT HEART CATHETERIZATION WITH CORONARY ANGIOGRAM;  Surgeon: Laverda Page, MD;  Location: [facility] CATH LAB;  Service: Cardiovascular;  Laterality: N/A;    Current Medications: No outpatient medications have been marked as taking for the 07/02/22 encounter (Appointment) with [place], Vilma Will M, MD.     Allergies:   Penicillins   Social History   Socioeconomic History   Marital status: Single    Spouse name: Not on file   Number of children: Not on file   Years of education: Not on file   Highest education level: Not on file  Occupational History   Not on file  Tobacco Use   Smoking status: Every Day    Packs/day: 0.25    Types: Cigarettes   Smokeless tobacco: Never  Vaping Use   Vaping Use: Never used  Substance and Sexual Activity   Alcohol use: Not Currently   Drug use: Not Currently   Sexual activity: Not on file  Other Topics Concern   Not on file  Social History Narrative   Not on file   Social Determinants of  Health   Financial Resource Strain: Not on file  Food Insecurity: Not on file  Transportation Needs: Not on file  Physical Activity: Not on file  Stress: Not on file  Social Connections: Not on file     Family History: The patient's ***family history includes Diabetes Mellitus II in his mother.  ROS:   Please see the history of present illness.    *** All other systems reviewed and are negative.  EKGs/Labs/Other Studies Reviewed:    The following studies were reviewed today: ***  EKG:  EKG is *** ordered today.  The ekg ordered today demonstrates ***  Recent Labs: 05/03/2022: BUN 13; Creatinine, Ser 0.79; Hemoglobin 16.4; Platelets 168; Potassium 4.3; Sodium 135  Recent Lipid Panel No results found for: "CHOL", "TRIG", "HDL", "CHOLHDL", "VLDL", "LDLCALC", "LDLDIRECT"   Risk Assessment/Calculations:   {Does this patient have ATRIAL FIBRILLATION?:[id number]}  No BP recorded.  {Refresh Note OR Click here to enter BP  :1}***         Physical Exam:    VS:  There were no vitals taken for this visit.    Wt Readings from Last 3 Encounters:  05/03/22 179 lb (81.2 kg)  06/27/20 172 lb (78 kg)  06/17/20 172 lb (78 kg)     GEN: *** Well nourished,  well developed in no acute distress HEENT: Normal NECK: No JVD; No carotid bruits LYMPHATICS: No lymphadenopathy CARDIAC: ***RRR, no murmurs, rubs, gallops RESPIRATORY:  Clear to auscultation without rales, wheezing or rhonchi  ABDOMEN: Soft, non-tender, non-distended MUSCULOSKELETAL:  No edema; No deformity  SKIN: Warm and dry NEUROLOGIC:  Alert and oriented x 3 PSYCHIATRIC:  Normal affect   ASSESSMENT:    No diagnosis found. PLAN:    In order of problems listed above:  ***      {Are you ordering a CV Procedure (e.g. stress test, cath, DCCV, TEE, etc)?   Press F2        :[id number]    Medication Adjustments/Labs and Tests Ordered: Current medicines are reviewed at length with the patient today.  Concerns  regarding medicines are outlined above.  No orders of the defined types were placed in this encounter.  No orders of the defined types were placed in this encounter.   There are no Patient Instructions on file for this visit.   Signed, Karmina Zufall Martin[place], MD  06/22/2022 1:50 PM    [facility]

## 2022-07-02 ENCOUNTER — Ambulatory Visit: Payer: Self-pay | Attending: Internal Medicine | Admitting: Cardiology

## 2022-07-17 ENCOUNTER — Ambulatory Visit (HOSPITAL_COMMUNITY)
Admission: EM | Admit: 2022-07-17 | Discharge: 2022-07-17 | Disposition: A | Discharge status: Home / Self Care | Payer: Managed Care, Other (non HMO) | Attending: Emergency Medicine | Admitting: Emergency Medicine

## 2022-07-17 DIAGNOSIS — J4521 Mild intermittent asthma with (acute) exacerbation: Secondary | ICD-10-CM | POA: Diagnosis present

## 2022-07-17 DIAGNOSIS — R0602 Shortness of breath: Secondary | ICD-10-CM | POA: Insufficient documentation

## 2022-07-17 DIAGNOSIS — R059 Cough, unspecified: Secondary | ICD-10-CM | POA: Insufficient documentation

## 2022-07-17 DIAGNOSIS — J069 Acute upper respiratory infection, unspecified: Secondary | ICD-10-CM | POA: Insufficient documentation

## 2022-07-17 DIAGNOSIS — Z1152 Encounter for screening for COVID-19: Secondary | ICD-10-CM | POA: Diagnosis not present

## 2022-07-17 MED ORDER — ALBUTEROL SULFATE HFA 108 (90 BASE) MCG/ACT IN AERS: 2.0000 | INHALATION_SPRAY | Freq: Four times a day (QID) | RESPIRATORY_TRACT | 0 refills | Status: DC | PRN

## 2022-07-17 MED ORDER — ALBUTEROL SULFATE HFA 108 (90 BASE) MCG/ACT IN AERS
INHALATION_SPRAY | RESPIRATORY_TRACT | Status: AC
  Filled 2022-07-17: qty 6.7

## 2022-07-17 MED ORDER — ALBUTEROL SULFATE HFA 108 (90 BASE) MCG/ACT IN AERS
2.0000 | INHALATION_SPRAY | Freq: Once | RESPIRATORY_TRACT | Status: AC
  Administered 2022-07-17: 2 via RESPIRATORY_TRACT

## 2022-07-17 MED ORDER — PREDNISONE 20 MG PO TABS: 40.0000 mg | ORAL_TABLET | Freq: Every day | ORAL | 0 refills | Status: AC

## 2022-07-17 MED ORDER — BENZONATATE 100 MG PO CAPS: 100.0000 mg | ORAL_CAPSULE | Freq: Three times a day (TID) | ORAL | 0 refills | Status: DC | PRN

## 2022-07-17 NOTE — ED Provider Notes (Signed)
[place]    CSN: [id number] Arrival date & time: 07/17/22  1456     History   Chief Complaint Chief Complaint  Patient presents with   Shortness of Breath   Hernia   Cough    HPI Sean Martinez is a 62 y.o. male.  Presents with 2 day history of cough, runny nose Actually states 3 year history of cough, but worsening over the last 2 days Reports shortness of breath during coughing fits, none at rest Some chest tightness with cough Denies chest pain No fevers  Has not taken any medications  Hx of asthma, ran out of inhaler He is current every day smoker  Past Medical History:  Diagnosis Date   Asthma    Coronary artery disease    MI, old     Patient Active Problem List   Diagnosis Date Noted   Chest wall pain 07/17/2012   Hypotension, unspecified 05/31/2012   Chest pain 05/30/2012   Right groin pain 05/30/2012   Diarrhea 05/30/2012   Tobacco abuse 05/30/2012    Past Surgical History:  Procedure Laterality Date   HERNIA REPAIR     LEFT HEART CATHETERIZATION WITH CORONARY ANGIOGRAM N/A 07/17/2012   Procedure: LEFT HEART CATHETERIZATION WITH CORONARY ANGIOGRAM;  Surgeon: Laverda Page, MD;  Location: [facility] CATH LAB;  Service: Cardiovascular;  Laterality: N/A;    Home Medications    Prior to Admission medications   Medication Sig Start Date End Date Taking? Authorizing Provider  albuterol (VENTOLIN HFA) 108 (90 Base) MCG/ACT inhaler Inhale 2 puffs into the lungs every 6 (six) hours as needed for wheezing or shortness of breath. 07/17/22  Yes Nanda Bittick, Wells Guiles, PA-C  benzonatate (TESSALON) 100 MG capsule Take 1 capsule (100 mg total) by mouth 3 (three) times daily as needed for cough. 07/17/22  Yes Aletha Allebach, Wells Guiles, PA-C  predniSONE (DELTASONE) 20 MG tablet Take 2 tablets (40 mg total) by mouth daily with breakfast for 5 days. 07/17/22 07/22/22 Yes Tywon Niday, Wells Guiles, PA-C    Family History Family History  Problem Relation Age of Onset   Diabetes  Mellitus II Mother     Social History Social History   Tobacco Use   Smoking status: Every Day    Packs/day: 0.25    Types: Cigarettes   Smokeless tobacco: Never  Vaping Use   Vaping Use: Never used  Substance Use Topics   Alcohol use: Not Currently   Drug use: Not Currently     Allergies   Penicillins   Review of Systems Review of Systems As per HPI  Physical Exam Triage Vital Signs ED Triage Vitals  Enc Vitals Group     BP --      Pulse Rate 07/17/22 1503 98     Resp 07/17/22 1503 18     Temp 07/17/22 1503 98.3 F (36.8 C)     Temp Source 07/17/22 1503 Oral     SpO2 07/17/22 1503 92 %     Weight --      Height --      Head Circumference --      Peak Flow --      Pain Score 07/17/22 1508 0     Pain Loc --      Pain Edu? --      Excl. in [place]? --    No data found.  Updated Vital Signs BP (!) 143/87 (BP Location: Left Arm)   Pulse 87   Temp 98.7 F (37.1 C) (Oral)  Resp 18   SpO2 95%   Physical Exam Vitals and nursing note reviewed.  Constitutional:      General: He is not in acute distress.    Comments: Speaks in full sentence. Normal work of breathing.   HENT:     Mouth/Throat:     Mouth: Mucous membranes are moist.     Pharynx: Oropharynx is clear. No posterior oropharyngeal erythema.  Eyes:     Conjunctiva/sclera: Conjunctivae normal.  Cardiovascular:     Rate and Rhythm: Normal rate and regular rhythm.     Pulses: Normal pulses.     Heart sounds: Normal heart sounds.  Pulmonary:     Effort: Pulmonary effort is normal. No tachypnea or respiratory distress.     Breath sounds: Wheezing present.     Comments: Faint expirational wheezes throughout  Skin:    General: Skin is warm and dry.  Neurological:     Mental Status: He is alert and oriented to person, place, and time.     UC Treatments / Results  Labs (all labs ordered are listed, but only abnormal results are displayed) Labs Reviewed  SARS CORONAVIRUS 2 (TAT 6-24 HRS)     EKG  Radiology No results found.  Procedures Procedures   Medications Ordered in UC Medications  albuterol (VENTOLIN HFA) 108 (90 Base) MCG/ACT inhaler 2 puff (2 puffs Inhalation Given 07/17/22 1648)    Initial Impression / Assessment and Plan / UC Course  I have reviewed the triage vital signs and the nursing notes.  Pertinent labs & imaging results that were available during my care of the patient were reviewed by me and considered in my medical decision making (see chart for details).  Well appearing without respiratory distress Sating 95-97% on RA Frequent cough in clinic, faint wheezing heard  Mild asthma exacerbation, likely caused by virus Covid test pending. Candidate for paxlovid if positive (GFR >60)  2 puffs albuterol in clinic. Recommend continue q6 hours for the next several days. Prednisone 40 mg daily x 5 days. Sent tessalon to use TID prn for cough Return precautions discussed including strict ED precautions. Patient agrees to plan  Final Clinical Impressions(s) / UC Diagnoses   Final diagnoses:  Mild intermittent asthma with acute exacerbation  Viral URI with cough     Discharge Instructions      We will call you if your covid test returns positive.   Please use the albuterol inhaler 3 times daily (every 6 hours) for the next 3 to 4 days.  Then continue as needed.  I have sent in a refill for you as well.  Take the prednisone once daily for the next 5 days.  You can use the Tessalon cough pills 3 times daily as needed.  If these make you drowsy take only at bedtime.  Please go to the emergency department if symptoms worsen.      ED Prescriptions     Medication Sig Dispense Auth. Provider   albuterol (VENTOLIN HFA) 108 (90 Base) MCG/ACT inhaler Inhale 2 puffs into the lungs every 6 (six) hours as needed for wheezing or shortness of breath. 18 g Anaisabel Pederson, PA-C   predniSONE (DELTASONE) 20 MG tablet Take 2 tablets (40 mg total) by  mouth daily with breakfast for 5 days. 10 tablet [place] Sarinana, PA-C   benzonatate (TESSALON) 100 MG capsule Take 1 capsule (100 mg total) by mouth 3 (three) times daily as needed for cough. 20 capsule Daven Pinckney, Wells Guiles, [place]  PDMP not reviewed this encounter.   Marlow Baars, [place] 07/17/22 1655

## 2022-07-17 NOTE — ED Notes (Signed)
Pt o/s went from 95% to (92% provider was notified

## 2022-07-17 NOTE — ED Triage Notes (Signed)
Pt came in for SOB, pt states he has asthma but was unable to Dr. Broadus John February 1st , runny nose, chest feels tight x few days

## 2022-07-17 NOTE — Discharge Instructions (Addendum)
We will call you if your covid test returns positive.   Please use the albuterol inhaler 3 times daily (every 6 hours) for the next 3 to 4 days.  Then continue as needed.  I have sent in a refill for you as well.  Take the prednisone once daily for the next 5 days.  You can use the Tessalon cough pills 3 times daily as needed.  If these make you drowsy take only at bedtime.  Please go to the emergency department if symptoms worsen.

## 2022-07-18 LAB — SARS CORONAVIRUS 2 (TAT 6-24 HRS): SARS Coronavirus 2: NEGATIVE

## 2022-07-22 ENCOUNTER — Ambulatory Visit: Payer: Managed Care, Other (non HMO) | Admitting: Family Medicine

## 2022-07-22 ENCOUNTER — Encounter: Payer: Self-pay | Admitting: Family Medicine

## 2022-07-22 VITALS — BP 124/80 | HR 77 | Temp 97.8°F | Ht 71.5 in | Wt 193.4 lb

## 2022-07-22 DIAGNOSIS — J449 Chronic obstructive pulmonary disease, unspecified: Secondary | ICD-10-CM | POA: Insufficient documentation

## 2022-07-22 DIAGNOSIS — E1165 Type 2 diabetes mellitus with hyperglycemia: Secondary | ICD-10-CM

## 2022-07-22 DIAGNOSIS — Z720 Tobacco use: Secondary | ICD-10-CM | POA: Diagnosis not present

## 2022-07-22 DIAGNOSIS — J441 Chronic obstructive pulmonary disease with (acute) exacerbation: Secondary | ICD-10-CM | POA: Insufficient documentation

## 2022-07-22 DIAGNOSIS — R0609 Other forms of dyspnea: Secondary | ICD-10-CM | POA: Insufficient documentation

## 2022-07-22 DIAGNOSIS — Z23 Encounter for immunization: Secondary | ICD-10-CM

## 2022-07-22 DIAGNOSIS — R053 Chronic cough: Secondary | ICD-10-CM | POA: Diagnosis not present

## 2022-07-22 MED ORDER — NICOTINE 21 MG/24HR TD PT24: 21.0000 mg | MEDICATED_PATCH | Freq: Every day | TRANSDERMAL | 0 refills | Status: DC

## 2022-07-22 MED ORDER — FLUTICASONE-SALMETEROL 100-50 MCG/ACT IN AEPB: 1.0000 | INHALATION_SPRAY | Freq: Two times a day (BID) | RESPIRATORY_TRACT | 0 refills | Status: DC

## 2022-07-22 NOTE — Assessment & Plan Note (Addendum)
Chronic cough and shortness of breath are persistent problems. Smoking history and family history suggest a likely diagnosis of COPD. Recent cardiac evaluation appeared normal, but a past history of heart disease cannot be completely ruled out.  Differential diagnosis: 1. Chronic Obstructive Pulmonary Disease (COPD): Most likely given smoking history, chronic cough, and family history of COPD. 2. Heart Failure: Less likely due to recent normal cardiac biomarkers, but shortness of breath and nocturia could be symptoms. 3. Pulmonary Embolism: Unlikely, given a past negative CT scan of the chest. 4. Bronchitis: History of recurrent bronchitis may be contributory but does not fully explain the chronicity and other symptoms like nocturia.  Plan: A referral to a pulmonologist for a pulmonary function test to diagnose or rule out COPD.  He will start [facility] for symptomatic management.  Nicotine patches to be initiated for assistance with smoking cessation. Referral to cardiology for reassessment of heart health, given the past medical history and current symptoms. Further lab work-up including BNP to re-evaluate for possible heart failure. Follow-up based on specialist recommendations.

## 2022-07-22 NOTE — Progress Notes (Signed)
Assessment/Plan:   Problem List Items Addressed This Visit       Other   Tobacco abuse   Relevant Medications   nicotine (NICODERM CQ) 21 mg/24hr patch   Chronic cough - Primary    Chronic cough and shortness of breath are persistent problems. Smoking history and family history suggest a likely diagnosis of COPD. Recent cardiac evaluation appeared normal, but a past history of heart disease cannot be completely ruled out.  Differential diagnosis: 1. Chronic Obstructive Pulmonary Disease (COPD): Most likely given smoking history, chronic cough, and family history of COPD. 2. Heart Failure: Less likely due to recent normal cardiac biomarkers, but shortness of breath and nocturia could be symptoms. 3. Pulmonary Embolism: Unlikely, given a past negative CT scan of the chest. 4. Bronchitis: History of recurrent bronchitis may be contributory but does not fully explain the chronicity and other symptoms like nocturia.  Plan: A referral to a pulmonologist for a pulmonary function test to diagnose or rule out COPD.  He will start [facility] for symptomatic management.  Nicotine patches to be initiated for assistance with smoking cessation. Referral to cardiology for reassessment of heart health, given the past medical history and current symptoms. Further lab work-up including BNP to re-evaluate for possible heart failure. Follow-up based on specialist recommendations.      Relevant Medications   nicotine (NICODERM CQ) 21 mg/24hr patch   fluticasone-salmeterol (WIXELA INHUB) 100-50 MCG/ACT AEPB   Other Relevant Orders   Ambulatory referral to Cardiology   Ambulatory referral to Pulmonology   Vitamin D 1,25 dihydroxy   Urinalysis, Routine w reflex microscopic   TSH   Microalbumin / creatinine urine ratio   Lipid panel   Hemoglobin A1c   CBC with Differential/Platelet   Comprehensive metabolic panel   Dyspnea on exertion   Relevant Medications   nicotine (NICODERM CQ) 21 mg/24hr patch    fluticasone-salmeterol (WIXELA INHUB) 100-50 MCG/ACT AEPB   Other Relevant Orders   Flu Vaccine QUAD 6+ mos PF IM (Fluarix Quad PF) (Completed)   Ambulatory referral to Cardiology   Ambulatory referral to Pulmonology   Vitamin D 1,25 dihydroxy   Urinalysis, Routine w reflex microscopic   TSH   Microalbumin / creatinine urine ratio   Lipid panel   Hemoglobin A1c   CBC with Differential/Platelet   Comprehensive metabolic panel   Other Visit Diagnoses     Needs flu shot       Relevant Orders   Flu Vaccine QUAD 6+ mos PF IM (Fluarix Quad PF) (Completed)       There are no discontinued medications.    Subjective:  HPI: Encounter date: 07/22/2022  Sean Martinez is a 62 y.o. male who has Chest pain; Right groin pain; Diarrhea; Tobacco abuse; Hypotension, unspecified; Chest wall pain; Chronic cough; and Dyspnea on exertion on their problem list..   He  has a past medical history of Asthma, Coronary artery disease, and MI, old..   CHIEF COMPLAINT: Sean Martinez, a patient presenting with a chronic cough, shortness of breath upon minimal exertion, frequent nocturnal urination, and a desire for smoking cessation assistance.  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 Originally from [place], moved to [place] in [place] and then to [place] before returning to care for his mother.  Problem 1: Sean Martinez reports chronic cough ongoing for approximately 3 years, which has persisted despite smoking cessation efforts. He also describes significant shortness of  breath when walking a short distance. In addition, he has nocturia, disrupting his sleep by necessitating bathroom visits every 2 hours.  Problem 2: He expresses a wish to stop smoking, aiming for smoking cessation.  REVIEW OF SYSTEMS: Respiratory: Positive for cough and shortness of breath. Cardiovascular: No chest pain or palpitations  reported. Genitourinary: Positive for nocturia. The remainder of systems review is negative.  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  Past Surgical History:  Procedure Laterality Date   HERNIA REPAIR     LEFT HEART CATHETERIZATION WITH CORONARY ANGIOGRAM N/A 07/17/2012   Procedure: LEFT HEART CATHETERIZATION WITH CORONARY ANGIOGRAM;  Surgeon: Laverda Page, MD;  Location: [facility] CATH LAB;  Service: Cardiovascular;  Laterality: N/A;    Outpatient Medications Prior to Visit  Medication Sig Dispense Refill   albuterol (VENTOLIN HFA) 108 (90 Base) MCG/ACT inhaler Inhale 2 puffs into the lungs every 6 (six) hours as needed for wheezing or shortness of breath. 18 g 0   benzonatate (TESSALON) 100 MG capsule Take 1 capsule (100 mg total) by mouth 3 (three) times daily as needed for cough. 20 capsule 0   predniSONE (DELTASONE) 20 MG tablet Take 2 tablets (40 mg total) by mouth daily with breakfast for 5 days. 10 tablet 0   No facility-administered medications prior to visit.    Family History  Problem Relation Age of Onset   Diabetes Mellitus II Mother     Social History   Socioeconomic History   Marital status: Single    Spouse name: Not on file   Number of children: Not on file   Years of education: Not on file   Highest education level: Not on file  Occupational History   Not on file  Tobacco Use   Smoking status: Every Day    Packs/day: 0.25    Types: Cigarettes    Passive exposure: Never   Smokeless tobacco: Never  Vaping Use   Vaping Use: Never used  Substance and Sexual Activity   Alcohol use: Not Currently   Drug use: Not Currently   Sexual activity: Not on file  Other Topics Concern   Not on file  Social History Narrative   Not on file   Social Determinants of Health    Financial Resource Strain: Not on file  Food Insecurity: Not on file  Transportation Needs: Not on file  Physical Activity: Not on file  Stress: Not on file  Social Connections: Not on file  Intimate Partner Violence: Not on file                                                                                                 Objective:  Physical Exam: BP 124/80 (BP Location: Left Arm, Patient Position: Sitting, Cuff Size: Large)   Pulse 77   Temp 97.8 F (36.6 C) (Temporal)   Ht 5' 11.5" (1.816 m)   Wt 193 lb 6.4 oz (87.7 kg)   SpO2  98%   BMI 26.60 kg/m    General: No acute distress. Awake and conversant.  Eyes: Normal conjunctiva, anicteric. Round symmetric pupils.  ENT: Hearing grossly intact. No nasal discharge.  Neck: Neck is supple. No masses or thyromegaly.  Respiratory: Respirations are non-labored. No auditory wheezing.  Skin: Warm. No rashes or ulcers.  Psych: Alert and oriented. Cooperative, Appropriate mood and affect, Normal judgment.  CV: No cyanosis or JVD MSK: Normal ambulation. No clubbing  Neuro: Sensation and CN II-XII grossly normal.   LABS: Recent labs (within a few months) noted as normal including BNP, troponin, electrolytes, GFR 50-60, CBC with no anemia, no signs of infection.  IMAGING: A recent chest X-ray done 2 months ago reported no cardiopulmonary disease.     Alesia Banda, MD, MS

## 2022-07-22 NOTE — Patient Instructions (Signed)
Please start Sean Martinez for possible COPD while awaiting assessment by pulmonology and cardiology.  We will call you about lab results.  Office will call you to schedule referral and follow-up appointments

## 2022-07-23 LAB — URINALYSIS, ROUTINE W REFLEX MICROSCOPIC
Bilirubin Urine: NEGATIVE
Hgb urine dipstick: NEGATIVE
Ketones, ur: NEGATIVE
Leukocytes,Ua: NEGATIVE
Nitrite: POSITIVE — AB
Specific Gravity, Urine: <=1.005 — AB (ref 1.000–1.030)
Total Protein, Urine: NEGATIVE
Urine Glucose: 250 — AB
Urobilinogen, UA: 0.2 (ref 0.0–1.0)
pH: 7 (ref 5.0–8.0)

## 2022-07-23 LAB — MICROALBUMIN / CREATININE URINE RATIO
Creatinine,U: 29.7 mg/dL
Microalb Creat Ratio: 7.9 mg/g (ref 0.0–30.0)
Microalb, Ur: 2.3 mg/dL — ABNORMAL HIGH (ref 0.0–1.9)

## 2022-07-23 MED ORDER — BLOOD GLUCOSE TEST VI STRP: 1.0000 | ORAL_STRIP | Freq: Three times a day (TID) | 0 refills | Status: AC

## 2022-07-23 MED ORDER — LANCET DEVICE MISC: 1.0000 | Freq: Three times a day (TID) | 0 refills | Status: AC

## 2022-07-23 MED ORDER — LANCETS MISC. MISC: 1.0000 | Freq: Three times a day (TID) | 0 refills | Status: AC

## 2022-07-23 MED ORDER — METFORMIN HCL 500 MG PO TABS: 500.0000 mg | ORAL_TABLET | Freq: Every day | ORAL | 0 refills | Status: DC

## 2022-07-23 MED ORDER — BLOOD GLUCOSE MONITORING SUPPL DEVI: 1.0000 | Freq: Three times a day (TID) | 0 refills | Status: DC

## 2022-07-23 NOTE — Addendum Note (Signed)
Addended by: Adora Fridge on: 07/23/2022 10:42 AM   Modules accepted: Orders

## 2022-07-23 NOTE — Addendum Note (Signed)
Addended by: Bonnita Hollow on: 07/23/2022 09:35 AM   Modules accepted: Orders

## 2022-07-26 LAB — COMPREHENSIVE METABOLIC PANEL
AG Ratio: 1.3 (calc) (ref 1.0–2.5)
ALT: 20 U/L (ref 9–46)
AST: 22 U/L (ref 10–35)
Albumin: 4.4 g/dL (ref 3.6–5.1)
Alkaline phosphatase (APISO): 68 U/L (ref 35–144)
BUN: 20 mg/dL (ref 7–25)
CO2: 18 mmol/L — ABNORMAL LOW (ref 20–32)
Calcium: 9.6 mg/dL (ref 8.6–10.3)
Chloride: 103 mmol/L (ref 98–110)
Creat: 0.93 mg/dL (ref 0.70–1.35)
Globulin: 3.4 g/dL (calc) (ref 1.9–3.7)
Glucose, Bld: 192 mg/dL — ABNORMAL HIGH (ref 65–99)
Potassium: 5.3 mmol/L (ref 3.5–5.3)
Sodium: 143 mmol/L (ref 135–146)
Total Bilirubin: 0.3 mg/dL (ref 0.2–1.2)
Total Protein: 7.8 g/dL (ref 6.1–8.1)

## 2022-07-26 LAB — MICROALBUMIN / CREATININE URINE RATIO

## 2022-07-26 LAB — LIPID PANEL
Cholesterol: 143 mg/dL (ref <200)
HDL: 74 mg/dL (ref >=40)
LDL Cholesterol (Calc): 54 mg/dL (calc)
Non-HDL Cholesterol (Calc): 69 mg/dL (calc) (ref <130)
Total CHOL/HDL Ratio: 1.9 (calc) (ref <5.0)
Triglycerides: 71 mg/dL (ref <150)

## 2022-07-26 LAB — CBC WITH DIFFERENTIAL/PLATELET
Absolute Monocytes: 72 cells/uL — ABNORMAL LOW (ref 200–950)
Basophils Absolute: 11 cells/uL (ref 0–200)
Basophils Relative: 0.3 %
Eosinophils Absolute: 0 cells/uL — ABNORMAL LOW (ref 15–500)
Eosinophils Relative: 0 %
HCT: 48.1 % (ref 38.5–50.0)
Hemoglobin: 17.3 g/dL — ABNORMAL HIGH (ref 13.2–17.1)
Lymphs Abs: 878 cells/uL (ref 850–3900)
MCH: 33 pg (ref 27.0–33.0)
MCHC: 36 g/dL (ref 32.0–36.0)
MCV: 91.8 fL (ref 80.0–100.0)
MPV: 11.8 fL (ref 7.5–12.5)
Monocytes Relative: 2 %
Neutro Abs: 2639 cells/uL (ref 1500–7800)
Neutrophils Relative %: 73.3 %
Platelets: 178 10*3/uL (ref 140–400)
RBC: 5.24 10*6/uL (ref 4.20–5.80)
RDW: 12.6 % (ref 11.0–15.0)
Total Lymphocyte: 24.4 %
WBC: 3.6 10*3/uL — ABNORMAL LOW (ref 3.8–10.8)

## 2022-07-26 LAB — URINALYSIS, ROUTINE W REFLEX MICROSCOPIC

## 2022-07-26 LAB — VITAMIN D 1,25 DIHYDROXY
Vitamin D 1, 25 (OH)2 Total: 73 pg/mL — ABNORMAL HIGH (ref 18–72)
Vitamin D2 1, 25 (OH)2: <8 pg/mL
Vitamin D3 1, 25 (OH)2: 73 pg/mL

## 2022-07-26 LAB — HEMOGLOBIN A1C
Hgb A1c MFr Bld: 7.3 % of total Hgb — ABNORMAL HIGH (ref <5.7)
Mean Plasma Glucose: 163 mg/dL
eAG (mmol/L): 9 mmol/L

## 2022-07-26 LAB — TSH: TSH: 0.51 mIU/L (ref 0.40–4.50)

## 2022-08-06 ENCOUNTER — Ambulatory Visit: Payer: Managed Care, Other (non HMO) | Admitting: Family Medicine

## 2022-08-10 ENCOUNTER — Encounter: Payer: Self-pay | Admitting: Family Medicine

## 2022-08-10 ENCOUNTER — Ambulatory Visit (INDEPENDENT_AMBULATORY_CARE_PROVIDER_SITE_OTHER): Payer: Managed Care, Other (non HMO) | Admitting: Family Medicine

## 2022-08-10 VITALS — BP 138/86 | HR 79 | Temp 97.8°F | Wt 191.0 lb

## 2022-08-10 DIAGNOSIS — J449 Chronic obstructive pulmonary disease, unspecified: Secondary | ICD-10-CM | POA: Diagnosis not present

## 2022-08-10 DIAGNOSIS — E1165 Type 2 diabetes mellitus with hyperglycemia: Secondary | ICD-10-CM | POA: Insufficient documentation

## 2022-08-10 DIAGNOSIS — Z720 Tobacco use: Secondary | ICD-10-CM

## 2022-08-10 HISTORY — DX: Type 2 diabetes mellitus with hyperglycemia: E11.65

## 2022-08-10 MED ORDER — LOSARTAN POTASSIUM 25 MG PO TABS: 25.0000 mg | ORAL_TABLET | Freq: Every day | ORAL | 3 refills | Status: DC

## 2022-08-10 MED ORDER — ROSUVASTATIN CALCIUM 5 MG PO TABS: 2.5000 mg | ORAL_TABLET | Freq: Every day | ORAL | 3 refills | Status: DC

## 2022-08-10 MED ORDER — BUPROPION HCL ER (XL) 150 MG PO TB24: 150.0000 mg | ORAL_TABLET | Freq: Every day | ORAL | 0 refills | Status: DC

## 2022-08-10 NOTE — Progress Notes (Signed)
Assessment/Plan:   Problem List Items Addressed This Visit       Respiratory   Chronic obstructive pulmonary disease ([place])    Sean Martinez reports improved respiratory symptoms following recent illness that seemed to exacerbate his chronic COPD.   Plan:  Continue current inhaler therapies. Encourage adherence to medication regimen and follow up on the effect of medication changes.        Endocrine   Type 2 diabetes mellitus with hyperglycemia, without long-term current use of insulin ([place])    Sean Martinez's diabetes is not optimally controlled with an A1c just above the target. Recent eye exam shows no diabetic retinopathy. Plan:  Continue metformin 500 mg daily with breakfast. Consider introduction of rosuvastatin 2.5 mg tablet and losartan 12.5 mg tablet for overall cardiovascular and renal protection as part of diabetes management. Check comprehensive metabolic panel (CMP) in 2 weeks to monitor kidney function post-initiation of losartan.      Relevant Medications   losartan (COZAAR) 25 MG tablet   rosuvastatin (CRESTOR) 5 MG tablet   Other Relevant Orders   Comp Met (CMET)     Other   Tobacco abuse - Primary    Sean Martinez is motivated to quit smoking and has attempted nicotine replacement without sustained success. Plan:  Initiate bupropion (WELLBUTRIN XL) 150 mg once a day in the morning for smoking cessation support. Refer Sean Martinez to a Smoking Cessation Program. Evaluate the effectiveness and tolerance of smoking cessation efforts on follow-up.      Relevant Medications   buPROPion (WELLBUTRIN XL) 150 MG 24 hr tablet   Other Relevant Orders   Ambulatory referral to Smoking Cessation Program    There are no discontinued medications.    Subjective:  HPI: Encounter date: 08/10/2022  Sean Martinez is a 62 y.o. male who has Chest pain; Right groin pain; Diarrhea; Tobacco abuse; Hypotension, unspecified; Chest wall pain; Chronic cough; Dyspnea on exertion; Type 2 diabetes  mellitus with hyperglycemia, without long-term current use of insulin ([place]); and Chronic obstructive pulmonary disease ([place]) on their problem list..   He  has a past medical history of Asthma, Coronary artery disease, MI, old, and Type 2 diabetes mellitus with hyperglycemia, without long-term current use of insulin ([place]) (08/10/2022)..   CHIEF COMPLAINT: Sean Martinez is a 62 year old male presenting to discuss the medical management of chronic issues, specifically diabetes mellitus type 2, COPD, and tobacco use.  HISTORY OF PRESENT ILLNESS:  Diabetes Mellitus.  Sean Martinez's recent lab work revealed hemoglobin A1c levels just above target at 7.3, with a goal to bring it under 7. He reported a positive eye exam indicating no diabetic retinopathy. He is currently on metformin 500 mg daily with breakfast.  COPD.  Over the past couple of weeks, Sean Martinez experienced symptoms suggestive of a viral infection or possible COPD exacerbation. He uses albuterol and Wixela (fluticasone-salmeterol) for symptom control and has seen improvement in his breathing.  Tobacco Use.  Sean Martinez desires to quit smoking and has a history of using nicotine replacement therapy patches. He has not had success with Chantix in the past but is interested in trying bupropion for smoking cessation.  ROS:  Cardiology: Reports a history of coronary artery disease and myocardial infarction but no mention of current cardiac symptoms.  Respiratory: Improved breathing, not currently experiencing wheezing or dyspnea.  Endocrine: No polyuria or polydipsia  ENT: No new complaints.  Neurological: No new complaints.  Skin: No new complaints.  Past Surgical History:  Procedure Laterality Date   HERNIA  REPAIR     LEFT HEART CATHETERIZATION WITH CORONARY ANGIOGRAM N/A 07/17/2012   Procedure: LEFT HEART CATHETERIZATION WITH CORONARY ANGIOGRAM;  Surgeon: Laverda Page, MD;  Location: [facility] CATH LAB;  Service: Cardiovascular;  Laterality: N/A;     Outpatient Medications Prior to Visit  Medication Sig Dispense Refill   albuterol (VENTOLIN HFA) 108 (90 Base) MCG/ACT inhaler Inhale 2 puffs into the lungs every 6 (six) hours as needed for wheezing or shortness of breath. 18 g 0   fluticasone-salmeterol (WIXELA INHUB) 100-50 MCG/ACT AEPB Inhale 1 puff into the lungs 2 (two) times daily. 60 each 0   metFORMIN (GLUCOPHAGE) 500 MG tablet Take 1 tablet (500 mg total) by mouth daily with breakfast. 30 tablet 0   benzonatate (TESSALON) 100 MG capsule Take 1 capsule (100 mg total) by mouth 3 (three) times daily as needed for cough. (Patient not taking: Reported on 08/10/2022) 20 capsule 0   Blood Glucose Monitoring Suppl DEVI 1 each by Does not apply route in the morning, at noon, and at bedtime. May substitute to any manufacturer covered by patient's insurance. (Patient not taking: Reported on 08/10/2022) 1 each 0   Glucose Blood (BLOOD GLUCOSE TEST STRIPS) STRP 1 each by In Vitro route in the morning, at noon, and at bedtime. May substitute to any manufacturer covered by patient's insurance. (Patient not taking: Reported on 08/10/2022) 100 strip 0   Lancet Device MISC 1 each by Does not apply route in the morning, at noon, and at bedtime. May substitute to any manufacturer covered by patient's insurance. (Patient not taking: Reported on 08/10/2022) 1 each 0   Lancets Misc. MISC 1 each by Does not apply route in the morning, at noon, and at bedtime. May substitute to any manufacturer covered by patient's insurance. (Patient not taking: Reported on 08/10/2022) 100 each 0   nicotine (NICODERM CQ) 21 mg/24hr patch Place 1 patch (21 mg total) onto the skin daily. (Patient not taking: Reported on 08/10/2022) 28 patch 0   No facility-administered medications prior to visit.    Family History  Problem Relation Age of Onset   Diabetes Mellitus II Mother     Social History   Socioeconomic History   Marital status: Single    Spouse name: Not on file    Number of children: Not on file   Years of education: Not on file   Highest education level: Not on file  Occupational History   Not on file  Tobacco Use   Smoking status: Every Day    Packs/day: 0.25    Types: Cigarettes    Passive exposure: Never   Smokeless tobacco: Never  Vaping Use   Vaping Use: Never used  Substance and Sexual Activity   Alcohol use: Not Currently   Drug use: Not Currently   Sexual activity: Not on file  Other Topics Concern   Not on file  Social History Narrative   Not on file   Social Determinants of Health   Financial Resource Strain: Not on file  Food Insecurity: Not on file  Transportation Needs: Not on file  Physical Activity: Not on file  Stress: Not on file  Social Connections: Not on file  Intimate Partner Violence: Not on file  Objective:  Physical Exam: BP 138/86 (BP Location: Left Arm, Patient Position: Sitting, Cuff Size: Large)   Pulse 79   Temp 97.8 F (36.6 C) (Temporal)   Wt 191 lb (86.6 kg)   SpO2 99%   BMI 26.27 kg/m    General: No acute distress. Awake and conversant.  Eyes: Normal conjunctiva, anicteric. Round symmetric pupils.  ENT: Hearing grossly intact. No nasal discharge.  Neck: Neck is supple. No masses or thyromegaly.  Respiratory: Respirations are non-labored. No auditory wheezing.  Skin: Warm. No rashes or ulcers.  Psych: Alert and oriented. Cooperative, Appropriate mood and affect, Normal judgment.  CV: No cyanosis or JVD MSK: Normal ambulation. No clubbing  Neuro: Sensation and CN II-XII grossly normal.       Alesia Banda, MD, MS

## 2022-08-10 NOTE — Assessment & Plan Note (Signed)
Sean Martinez is motivated to quit smoking and has attempted nicotine replacement without sustained success. Plan:  Initiate bupropion (WELLBUTRIN XL) 150 mg once a day in the morning for smoking cessation support. Refer Sean Martinez to a Smoking Cessation Program. Evaluate the effectiveness and tolerance of smoking cessation efforts on follow-up.

## 2022-08-10 NOTE — Patient Instructions (Addendum)
It was a pleasure to see you today! Thank you for choosing [facility] for your primary care. Sean Martinez was seen for diabetes follow up.   For diabetic complication prevention, please start rosuvastatin and losartan as discussed.  For smoking cessation, start bupropion.  Please follow-up in 2 weeks for a lab visit   And 3 months for follow-up.

## 2022-08-10 NOTE — Assessment & Plan Note (Signed)
Hewlett reports improved respiratory symptoms following recent illness that seemed to exacerbate his chronic COPD.   Plan:  Continue current inhaler therapies. Encourage adherence to medication regimen and follow up on the effect of medication changes.

## 2022-08-10 NOTE — Assessment & Plan Note (Signed)
Sean Martinez's diabetes is not optimally controlled with an A1c just above the target. Recent eye exam shows no diabetic retinopathy. Plan:  Continue metformin 500 mg daily with breakfast. Consider introduction of rosuvastatin 2.5 mg tablet and losartan 12.5 mg tablet for overall cardiovascular and renal protection as part of diabetes management. Check comprehensive metabolic panel (CMP) in 2 weeks to monitor kidney function post-initiation of losartan.

## 2022-08-11 ENCOUNTER — Institutional Professional Consult (permissible substitution): Payer: Managed Care, Other (non HMO) | Admitting: Pulmonary Disease

## 2022-08-17 ENCOUNTER — Telehealth: Payer: Self-pay | Admitting: Family Medicine

## 2022-08-17 NOTE — Telephone Encounter (Signed)
Caller Name: Sean Martinez Call back phone #: [phone]  Reason for Call: Pt wanted to give his readings from his glucose monitor 2/26 134 2/27 132 after lunch 131 Please call to discuss.

## 2022-08-23 NOTE — Telephone Encounter (Signed)
Left patient a detailed voice message advising him of annotation below.

## 2022-08-25 ENCOUNTER — Telehealth: Payer: Self-pay | Admitting: Family Medicine

## 2022-08-25 MED ORDER — ALBUTEROL SULFATE HFA 108 (90 BASE) MCG/ACT IN AERS: 2.0000 | INHALATION_SPRAY | Freq: Four times a day (QID) | RESPIRATORY_TRACT | 0 refills | Status: DC | PRN

## 2022-08-25 NOTE — Telephone Encounter (Signed)
Prescription Request  08/25/2022  LOV: 08/10/2022  What is the name of the medication or equipment? albuterol (VENTOLIN HFA) 108 (90 Base)   Have you contacted your pharmacy to request a refill? Yes   Which pharmacy would you like this sent to?    [facility], [place] [address] Phone: [phone] Fax: [phone]     Patient notified that their request is being sent to the clinical staff for review and that they should receive a response within 2 business days.   Please advise at Mobile [phone] (mobile)

## 2022-08-26 NOTE — Telephone Encounter (Signed)
error 

## 2022-09-03 ENCOUNTER — Institutional Professional Consult (permissible substitution): Payer: Managed Care, Other (non HMO) | Admitting: Pulmonary Disease

## 2022-09-15 ENCOUNTER — Telehealth: Payer: Self-pay | Admitting: Family Medicine

## 2022-09-15 MED ORDER — ALBUTEROL SULFATE HFA 108 (90 BASE) MCG/ACT IN AERS: 2.0000 | INHALATION_SPRAY | Freq: Four times a day (QID) | RESPIRATORY_TRACT | 0 refills | Status: DC | PRN

## 2022-09-15 NOTE — Telephone Encounter (Signed)
Chart supports rx. Last OV: 08/10/2022 Nxt OV: 10/20/2022

## 2022-09-15 NOTE — Telephone Encounter (Signed)
Caller Name: Shiloh Call back phone #: [phone]   MEDICATION(S):  Albuterol  Days of Med Remaining: 0  Preferred Pharmacy:  [facility]

## 2022-10-04 ENCOUNTER — Telehealth: Payer: Self-pay | Admitting: Family Medicine

## 2022-10-04 MED ORDER — ALBUTEROL SULFATE HFA 108 (90 BASE) MCG/ACT IN AERS: 2.0000 | INHALATION_SPRAY | Freq: Four times a day (QID) | RESPIRATORY_TRACT | 0 refills | Status: DC | PRN

## 2022-10-04 NOTE — Telephone Encounter (Signed)
Chart supports rx. Last OV: 08/10/2022 Next OV: 10/20/2022

## 2022-10-04 NOTE — Telephone Encounter (Signed)
Prescription Request  10/04/2022  LOV: 08/10/2022  What is the name of the medication or equipment? albuterol (VENTOLIN HFA) 108 (90 Base) MCG/ACT inhaler [[id number]]   Have you contacted your pharmacy to request a refill? No   Which pharmacy would you like this sent to?   [facility] [address] [place], [place] - [address] [place] [address] Phone: [phone] Fax: [phone]   Patient notified that their request is being sent to the clinical staff for review and that they should receive a response within 2 business days.   Please advise at Mobile [phone] (mobile)  pt misplaced his medication

## 2022-10-19 ENCOUNTER — Telehealth: Payer: Self-pay | Admitting: Family Medicine

## 2022-10-19 DIAGNOSIS — J441 Chronic obstructive pulmonary disease with (acute) exacerbation: Secondary | ICD-10-CM

## 2022-10-19 MED ORDER — ALBUTEROL SULFATE HFA 108 (90 BASE) MCG/ACT IN AERS: 2.0000 | INHALATION_SPRAY | Freq: Four times a day (QID) | RESPIRATORY_TRACT | 0 refills | Status: DC | PRN

## 2022-10-19 NOTE — Telephone Encounter (Signed)
Pt needs his albuterol (VENTOLIN HFA) 108 (90 Base) MCG/ACT inhaler [[id number]] sent in he has about 8 puffs left. Call to [facility] on [address]

## 2022-10-19 NOTE — Telephone Encounter (Signed)
Chart supports rx. Last OV: 08/10/2022 Next OV: 10/20/2022  

## 2022-10-20 ENCOUNTER — Ambulatory Visit: Payer: Managed Care, Other (non HMO) | Admitting: Family Medicine

## 2022-10-20 ENCOUNTER — Encounter: Payer: Self-pay | Admitting: Family Medicine

## 2022-10-20 VITALS — BP 126/84 | HR 77 | Temp 97.6°F | Wt 190.0 lb

## 2022-10-20 DIAGNOSIS — J441 Chronic obstructive pulmonary disease with (acute) exacerbation: Secondary | ICD-10-CM | POA: Diagnosis not present

## 2022-10-20 DIAGNOSIS — Z7984 Long term (current) use of oral hypoglycemic drugs: Secondary | ICD-10-CM

## 2022-10-20 DIAGNOSIS — E1165 Type 2 diabetes mellitus with hyperglycemia: Secondary | ICD-10-CM | POA: Diagnosis not present

## 2022-10-20 DIAGNOSIS — Z720 Tobacco use: Secondary | ICD-10-CM | POA: Diagnosis not present

## 2022-10-20 DIAGNOSIS — Z91199 Patient's noncompliance with other medical treatment and regimen due to unspecified reason: Secondary | ICD-10-CM | POA: Insufficient documentation

## 2022-10-20 LAB — POCT GLYCOSYLATED HEMOGLOBIN (HGB A1C): Hemoglobin A1C: 6.9 % — AB (ref 4.0–5.6)

## 2022-10-20 MED ORDER — PREDNISONE 50 MG PO TABS: ORAL_TABLET | ORAL | 0 refills | Status: DC

## 2022-10-20 MED ORDER — FLUTICASONE-SALMETEROL 100-50 MCG/ACT IN AEPB: 1.0000 | INHALATION_SPRAY | Freq: Two times a day (BID) | RESPIRATORY_TRACT | 0 refills | Status: DC

## 2022-10-20 MED ORDER — METFORMIN HCL 500 MG PO TABS: 500.0000 mg | ORAL_TABLET | Freq: Every day | ORAL | 0 refills | Status: DC

## 2022-10-20 MED ORDER — BENZONATATE 100 MG PO CAPS: 100.0000 mg | ORAL_CAPSULE | Freq: Three times a day (TID) | ORAL | 0 refills | Status: DC | PRN

## 2022-10-20 MED ORDER — ALBUTEROL SULFATE HFA 108 (90 BASE) MCG/ACT IN AERS: 2.0000 | INHALATION_SPRAY | Freq: Four times a day (QID) | RESPIRATORY_TRACT | 0 refills | Status: DC | PRN

## 2022-10-20 NOTE — Progress Notes (Signed)
Assessment/Plan:   Problem List Items Addressed This Visit       Respiratory   Chronic obstructive pulmonary disease [facility])    Sean Martinez increased use of albuterol suggests a possible exacerbation of COPD. He is running low on his fluticasone-salmeterol inhaler, which could have contributed to his increased symptoms.  Plan: Reorder fluticasone-salmeterol  inhaler for maintenance therapy. Prescribe a short course of prednisone to address the exacerbation of COPD symptoms. Monitor usage of albuterol and aim to reduce reliance on the rescue inhaler by improving control with maintenance therapy. Follow-up in 3 months or sooner if COPD symptoms do not improve.      Relevant Medications   albuterol (VENTOLIN HFA) 108 (90 Base) MCG/ACT inhaler   fluticasone-salmeterol (WIXELA INHUB) 100-50 MCG/ACT AEPB   benzonatate (TESSALON) 100 MG capsule   predniSONE (DELTASONE) 50 MG tablet     Endocrine   Type 2 diabetes mellitus with hyperglycemia, without long-term current use of insulin (HCC) - Primary    Sean Martinez has achieved a reduction in his A1C through lifestyle modification without the use of medication   Plan: Encourage continued adherence to a healthy diet and regular physical activity. Monitor A1C levels to ensure continued control of diabetes. Provide a prescription for metformin 500 mg as a proactive measure in case of an increase in blood sugar levels, especially during steroid treatment for COPD. Educate on monitoring blood glucose levels at home while on steroids and to contact the clinic if readings are consistently over 300 mg/dL or if symptoms of hyperglycemia develop.      Relevant Medications   metFORMIN (GLUCOPHAGE) 500 MG tablet   Other Relevant Orders   POCT glycosylated hemoglobin (Hb A1C) (Completed)     Other   Tobacco abuse    Sean Martinez is working on smoking cessation  Plan: Encourage the use of Wellbutrin to assist with smoking cessation. Explore  additional support, such as counseling or a support group. Discuss the benefits of stopping smoking for both pulmonary and cardiovascular health.      Nonadherence to medical treatment    Self discontinued losartan and rosuvastatin due to life style management. Encourage patient to continue these therapies.        Medications Discontinued During This Encounter  Medication Reason   metFORMIN (GLUCOPHAGE) 500 MG tablet    rosuvastatin (CRESTOR) 5 MG tablet    losartan (COZAAR) 25 MG tablet    buPROPion (WELLBUTRIN XL) 150 MG 24 hr tablet    fluticasone-salmeterol (WIXELA INHUB) 100-50 MCG/ACT AEPB    benzonatate (TESSALON) 100 MG capsule Reorder   albuterol (VENTOLIN HFA) 108 (90 Base) MCG/ACT inhaler Reorder    Return or sooner if COPD flair not improved., for DM.    Subjective:   Encounter date: 10/20/2022  Sean Martinez is a 62 y.o. male who has Chest pain; Right groin pain; Diarrhea; Tobacco abuse; Hypotension, unspecified; Chest wall pain; Chronic cough; Dyspnea on exertion; Type 2 diabetes mellitus with hyperglycemia, without long-term current use of insulin (HCC); Chronic obstructive pulmonary disease (HCC); and Nonadherence to medical treatment on their problem list..   He  has a past medical history of Asthma, Coronary artery disease, MI, old, and Type 2 diabetes mellitus with hyperglycemia, without long-term current use of insulin (HCC) (08/10/2022).Marland Kitchen   He presents with chief complaint of Medical Management of Chronic Issues (3 month follow up DM, smoking. Cut out sweets. Rx refill  wellbutrin. Albuterol inhaler sent in 10/19/2022.) .   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 He is currently not taking metformin or any other diabetes medications and manages his condition through  diet and exercise.  COPD and Smoking: The patient reports that he is working on smoking cessation and is currently on a medication regimen that includes an albuterol inhaler, which he is now using more frequently than before. He has run out of his maintenance inhaler (fluticasone-salmeterol) and needs a refill. His COPD symptoms, including wheezing and shortness of breath, are persistent triggers for concern.  Wellbutrin and Smoking Cessation: Sean Martinez has a prescription for Wellbutrin to assist with smoking cessation but has not yet initiated treatment.  Other: Sean Martinez is currently on vacation and is taking a 'staycation' at home.   Review of Systems  Constitutional:  Negative for fever.  Respiratory:  Positive for cough and shortness of breath. Negative for wheezing.   Cardiovascular:  Negative for chest pain.  All other systems reviewed and are negative.   Past Surgical History:  Procedure Laterality Date   HERNIA REPAIR     LEFT HEART CATHETERIZATION WITH CORONARY ANGIOGRAM N/A 07/17/2012   Procedure: LEFT HEART CATHETERIZATION WITH CORONARY ANGIOGRAM;  Surgeon: Pamella Pert, MD;  Location: [facility] CATH LAB;  Service: Cardiovascular;  Laterality: N/A;    Outpatient Medications Prior to Visit  Medication Sig Dispense Refill   albuterol (VENTOLIN HFA) 108 (90 Base) MCG/ACT inhaler Inhale 2 puffs into the lungs every 6 (six) hours as needed for wheezing or shortness of breath. 18 g 0   buPROPion (WELLBUTRIN XL) 150 MG 24 hr tablet Take 1 tablet (150 mg total) by mouth daily. 90 tablet 0   losartan (COZAAR) 25 MG tablet Take 1 tablet (25 mg total) by mouth daily. 90 tablet 3   Blood Glucose Monitoring Suppl DEVI 1 each by Does not apply route in the morning, at noon, and at bedtime. May substitute to any manufacturer covered by patient's insurance. (Patient not taking: Reported on 08/10/2022) 1 each 0   nicotine (NICODERM CQ) 21 mg/24hr patch Place 1 patch (21 mg total) onto the skin  daily. (Patient not taking: Reported on 08/10/2022) 28 patch 0   benzonatate (TESSALON) 100 MG capsule Take 1 capsule (100 mg total) by mouth 3 (three) times daily as needed for cough. (Patient not taking: Reported on 08/10/2022) 20 capsule 0   fluticasone-salmeterol (WIXELA INHUB) 100-50 MCG/ACT AEPB Inhale 1 puff into the lungs 2 (two) times daily. 60 each 0   metFORMIN (GLUCOPHAGE) 500 MG tablet Take 1 tablet (500 mg total) by mouth daily with breakfast. 30 tablet 0   rosuvastatin (CRESTOR) 5 MG tablet Take 0.5 tablets (2.5 mg total) by mouth daily. (Patient not taking: Reported on 10/20/2022) 45 tablet 3   No facility-administered medications prior to visit.    Family History  Problem Relation Age of Onset   Diabetes Mellitus II Mother     Social History   Socioeconomic History   Marital status: Single    Spouse name: Not on file   Number of children: Not on file   Years of education: Not on file   Highest education level: Not on file  Occupational History   Not on file  Tobacco Use   Smoking status: Every Day    Packs/day: .25    Types: Cigarettes  Passive exposure: Never   Smokeless tobacco: Never  Vaping Use   Vaping Use: Never used  Substance and Sexual Activity   Alcohol use: Not Currently   Drug use: Not Currently   Sexual activity: Not on file  Other Topics Concern   Not on file  Social History Narrative   Not on file   Social Determinants of Health   Financial Resource Strain: Not on file  Food Insecurity: Not on file  Transportation Needs: Not on file  Physical Activity: Not on file  Stress: Not on file  Social Connections: Not on file  Intimate Partner Violence: Not on file                                                                                                  Objective:  Physical Exam: BP 126/84 (BP Location: Left Arm, Patient Position: Sitting, Cuff Size: Large)   Pulse 77   Temp 97.6 F (36.4 C) (Temporal)   Wt 190 lb (86.2 kg)    SpO2 99%   BMI 26.13 kg/m   Wt Readings from Last 3 Encounters:  10/20/22 190 lb (86.2 kg)  08/10/22 191 lb (86.6 kg)  07/22/22 193 lb 6.4 oz (87.7 kg)     Physical Exam Constitutional:      Appearance: Normal appearance.  HENT:     Head: Normocephalic and atraumatic.     Right Ear: Hearing normal.     Left Ear: Hearing normal.     Nose: Nose normal.  Eyes:     General: No scleral icterus.       Right eye: No discharge.        Left eye: No discharge.     Extraocular Movements: Extraocular movements intact.  Cardiovascular:     Rate and Rhythm: Normal rate and regular rhythm.     Heart sounds: Normal heart sounds.  Pulmonary:     Effort: Pulmonary effort is normal.     Breath sounds: Normal breath sounds.  Abdominal:     Palpations: Abdomen is soft.     Tenderness: There is no abdominal tenderness.  Skin:    General: Skin is warm.     Findings: No rash.  Neurological:     General: No focal deficit present.     Mental Status: He is alert.     Cranial Nerves: No cranial nerve deficit.  Psychiatric:        Mood and Affect: Mood normal.        Behavior: Behavior normal.        Thought Content: Thought content normal.        Judgment: Judgment normal.     No results found.  Recent Results (from the past 2160 hour(s))  POCT glycosylated hemoglobin (Hb A1C)     Status: Abnormal   Collection Time: 10/20/22  2:00 PM  Result Value Ref Range   Hemoglobin A1C 6.9 (A) 4.0 - 5.6 %   HbA1c POC (<> result, manual entry)     HbA1c, POC (prediabetic range)     HbA1c, POC (controlled diabetic range)  Alesia Banda, MD, MS

## 2022-10-20 NOTE — Assessment & Plan Note (Addendum)
Self discontinued losartan and rosuvastatin due to life style management. Encourage patient to continue these therapies.

## 2022-10-22 NOTE — Telephone Encounter (Signed)
Pt says the script he got for his albuterol has cost $22 in the past. This one he tried to pick up was for $94. He can not afford this, he is wanting what was ordered before for $22. Please advise pt at [phone].

## 2022-10-25 NOTE — Assessment & Plan Note (Signed)
Sean Martinez increased use of albuterol suggests a possible exacerbation of COPD. He is running low on his fluticasone-salmeterol inhaler, which could have contributed to his increased symptoms.  Plan: Reorder fluticasone-salmeterol  inhaler for maintenance therapy. Prescribe a short course of prednisone to address the exacerbation of COPD symptoms. Monitor usage of albuterol and aim to reduce reliance on the rescue inhaler by improving control with maintenance therapy. Follow-up in 3 months or sooner if COPD symptoms do not improve.

## 2022-10-25 NOTE — Assessment & Plan Note (Signed)
Sean Martinez has achieved a reduction in his A1C through lifestyle modification without the use of medication   Plan: Encourage continued adherence to a healthy diet and regular physical activity. Monitor A1C levels to ensure continued control of diabetes. Provide a prescription for metformin 500 mg as a proactive measure in case of an increase in blood sugar levels, especially during steroid treatment for COPD. Educate on monitoring blood glucose levels at home while on steroids and to contact the clinic if readings are consistently over 300 mg/dL or if symptoms of hyperglycemia develop.

## 2022-10-25 NOTE — Assessment & Plan Note (Signed)
Mr. Stauff is working on smoking cessation  Plan: Encourage the use of Wellbutrin to assist with smoking cessation. Explore additional support, such as counseling or a support group. Discuss the benefits of stopping smoking for both pulmonary and cardiovascular health.

## 2022-10-26 NOTE — Telephone Encounter (Signed)
Spoke with pharmacist, [place], and he stated that $94 may be the cheapest option for a maintenance inhaler. He said we can try to send in an Advair inhaler to see if it would be a little cheaper.

## 2022-11-01 NOTE — Telephone Encounter (Signed)
Spoke with pharmacist and they state that albuterol is $22 and the wixhela is $94 with goodrx. He stated there are no cheaper options for the wixhela. Patient stated in previous note that he could only afford $22.00 albuterol inhaler.

## 2022-11-08 ENCOUNTER — Ambulatory Visit: Payer: Managed Care, Other (non HMO) | Admitting: Family Medicine

## 2022-11-08 ENCOUNTER — Telehealth: Payer: Self-pay | Admitting: Family Medicine

## 2022-11-10 MED ORDER — UMECLIDINIUM-VILANTEROL 62.5-25 MCG/ACT IN AEPB: 1.0000 | INHALATION_SPRAY | Freq: Every day | RESPIRATORY_TRACT | 0 refills | Status: AC

## 2022-11-10 MED ORDER — ALBUTEROL SULFATE HFA 108 (90 BASE) MCG/ACT IN AERS: 2.0000 | INHALATION_SPRAY | Freq: Four times a day (QID) | RESPIRATORY_TRACT | 0 refills | Status: DC | PRN

## 2022-11-10 NOTE — Addendum Note (Signed)
Addended by: Garnette Gunner on: 11/10/2022 06:23 PM   Modules accepted: Orders

## 2022-11-11 NOTE — Telephone Encounter (Signed)
Contacted pharmacy and was advised that the Anoro is coming up to $497.00 with good rx. She stated they don't have insurance listed for patient and that's why the inhalers are running high. She stated that if patient can bring in his rx cign[employer] card she can run it again. I contacted the patient and advised him of annotation above and he stated he will visit the pharmacy with cigna rx card.

## 2022-11-12 NOTE — Telephone Encounter (Signed)
error 

## 2022-11-18 ENCOUNTER — Other Ambulatory Visit: Payer: Self-pay | Admitting: Family Medicine

## 2022-11-22 ENCOUNTER — Ambulatory Visit (HOSPITAL_COMMUNITY)
Admission: EM | Admit: 2022-11-22 | Discharge: 2022-11-22 | Disposition: A | Discharge status: Home / Self Care | Payer: Managed Care, Other (non HMO) | Attending: Internal Medicine | Admitting: Internal Medicine

## 2022-11-22 ENCOUNTER — Encounter (HOSPITAL_COMMUNITY): Payer: Self-pay | Admitting: Emergency Medicine

## 2022-11-22 DIAGNOSIS — J441 Chronic obstructive pulmonary disease with (acute) exacerbation: Secondary | ICD-10-CM | POA: Insufficient documentation

## 2022-11-22 DIAGNOSIS — J029 Acute pharyngitis, unspecified: Secondary | ICD-10-CM | POA: Diagnosis present

## 2022-11-22 DIAGNOSIS — R1012 Left upper quadrant pain: Secondary | ICD-10-CM | POA: Insufficient documentation

## 2022-11-22 DIAGNOSIS — U071 COVID-19: Secondary | ICD-10-CM | POA: Diagnosis not present

## 2022-11-22 DIAGNOSIS — Z1152 Encounter for screening for COVID-19: Secondary | ICD-10-CM | POA: Diagnosis not present

## 2022-11-22 MED ORDER — BENZONATATE 200 MG PO CAPS: 200.0000 mg | ORAL_CAPSULE | Freq: Three times a day (TID) | ORAL | 0 refills | Status: DC | PRN

## 2022-11-22 MED ORDER — PREDNISONE 20 MG PO TABS: 40.0000 mg | ORAL_TABLET | Freq: Every day | ORAL | 0 refills | Status: AC

## 2022-11-22 MED ORDER — AZITHROMYCIN 250 MG PO TABS: ORAL_TABLET | ORAL | 0 refills | Status: DC

## 2022-11-22 MED ORDER — GUAIFENESIN ER 600 MG PO TB12: 600.0000 mg | ORAL_TABLET | Freq: Two times a day (BID) | ORAL | 0 refills | Status: AC

## 2022-11-22 NOTE — ED Provider Notes (Signed)
[facility]    CSN: [id number] Arrival date & time: 11/22/22  1028      History   Chief Complaint Chief Complaint  Patient presents with   Abdominal Pain    HPI Sean Martinez is a 62 y.o. male comes to the urgent care with 2-day history of sore throat, nonproductive cough, chest tightness, wheezing and left upper quadrant abdominal pain.  Patient's symptoms started insidiously and has been persistent.  Left upper quadrant abdominal pain is sharp and associated with coughing.  No fever or chills.  Patient is pertinent has been diagnosed with COVID-19 infection.  Patient denies any nausea, vomiting or diarrhea.  No abdominal bloating or distention.  Patient denies any dizziness.  Pain is not radiating.  He has not tried any over-the-counter medication to alleviate the pain.  Patient has a history of asthma and has been using his albuterol inhaler more frequently since the symptoms started.  He denies any significant improvement in symptoms after albuterol inhaler use.  HPI  Past Medical History:  Diagnosis Date   Asthma    Coronary artery disease    MI, old    Type 2 diabetes mellitus with hyperglycemia, without long-term current use of insulin (HCC) 08/10/2022    Patient Active Problem List   Diagnosis Date Noted   Nonadherence to medical treatment 10/20/2022   Type 2 diabetes mellitus with hyperglycemia, without long-term current use of insulin (HCC) 08/10/2022   Chronic obstructive pulmonary disease (HCC) 08/10/2022   Chronic cough 07/22/2022   Dyspnea on exertion 07/22/2022   Chest wall pain 07/17/2012   Hypotension, unspecified 05/31/2012   Chest pain 05/30/2012   Right groin pain 05/30/2012   Diarrhea 05/30/2012   Tobacco abuse 05/30/2012    Past Surgical History:  Procedure Laterality Date   HERNIA REPAIR     LEFT HEART CATHETERIZATION WITH CORONARY ANGIOGRAM N/A 07/17/2012   Procedure: LEFT HEART CATHETERIZATION WITH CORONARY ANGIOGRAM;  Surgeon:  Pamella Pert, MD;  Location: [facility] CATH LAB;  Service: Cardiovascular;  Laterality: N/A;       Home Medications    Prior to Admission medications   Medication Sig Start Date End Date Taking? Authorizing Provider  azithromycin (ZITHROMAX Z-PAK) 250 MG tablet Please take 2 tablets on day 1 then continue with 1 tablet orally daily from day 2-5. 11/22/22  Yes Homero Hyson, Britta Mccreedy, MD  benzonatate (TESSALON) 200 MG capsule Take 1 capsule (200 mg total) by mouth 3 (three) times daily as needed for cough. 11/22/22  Yes Regenia Erck, Britta Mccreedy, MD  guaiFENesin (MUCINEX) 600 MG 12 hr tablet Take 1 tablet (600 mg total) by mouth 2 (two) times daily for 7 days. 11/22/22 11/29/22 Yes Berneda Piccininni, Britta Mccreedy, MD  predniSONE (DELTASONE) 20 MG tablet Take 2 tablets (40 mg total) by mouth daily for 5 days. 11/22/22 11/27/22 Yes Norton Bivins, Britta Mccreedy, MD  albuterol (VENTOLIN HFA) 108 (90 Base) MCG/ACT inhaler Inhale 2 puffs into the lungs every 6 (six) hours as needed for wheezing or shortness of breath. 10/20/22   Garnette Gunner, MD  albuterol (VENTOLIN HFA) 108 (90 Base) MCG/ACT inhaler Inhale 2 puffs into the lungs every 6 (six) hours as needed for wheezing or shortness of breath. 11/10/22   Garnette Gunner, MD  Blood Glucose Monitoring Suppl DEVI 1 each by Does not apply route in the morning, at noon, and at bedtime. May substitute to any manufacturer covered by patient's insurance. Patient not taking: Reported on 08/10/2022 07/23/22   Janee Morn,  Ebbie Ridge, MD  metFORMIN (GLUCOPHAGE) 500 MG tablet Take 1 tablet (500 mg total) by mouth daily with breakfast. 10/20/22 11/19/22  Garnette Gunner, MD  nicotine (NICODERM CQ) 21 mg/24hr patch Place 1 patch (21 mg total) onto the skin daily. Patient not taking: Reported on 08/10/2022 07/22/22   Garnette Gunner, MD  umeclidinium-vilanterol [facility] ELLIPTA) 62.5-25 MCG/ACT AEPB Inhale 1 puff into the lungs daily at 6 (six) AM. 11/10/22 01/09/23  Garnette Gunner, MD    Family History Family History   Problem Relation Age of Onset   Diabetes Mellitus II Mother     Social History Social History   Tobacco Use   Smoking status: Every Day    Packs/day: .25    Types: Cigarettes    Passive exposure: Never   Smokeless tobacco: Never  Vaping Use   Vaping Use: Never used  Substance Use Topics   Alcohol use: Not Currently   Drug use: Not Currently     Allergies   Penicillins   Review of Systems Review of Systems As per HPI  Physical Exam Triage Vital Signs ED Triage Vitals [11/22/22 1210]  Enc Vitals Group     BP (!) 140/90     Pulse Rate 88     Resp 18     Temp 98.4 F (36.9 C)     Temp Source Oral     SpO2 94 %     Weight      Height      Head Circumference      Peak Flow      Pain Score 8     Pain Loc      Pain Edu?      Excl. in GC?    No data found.  Updated Vital Signs BP (!) 140/90 (BP Location: Left Arm)   Pulse 88   Temp 98.4 F (36.9 C) (Oral)   Resp 18   SpO2 94%   Visual Acuity Right Eye Distance:   Left Eye Distance:   Bilateral Distance:    Right Eye Near:   Left Eye Near:    Bilateral Near:     Physical Exam Vitals and nursing note reviewed.  Constitutional:      General: He is not in acute distress.    Appearance: He is not ill-appearing.  Cardiovascular:     Rate and Rhythm: Normal rate and regular rhythm.  Pulmonary:     Effort: Pulmonary effort is normal.     Breath sounds: Wheezing present.  Abdominal:     General: Bowel sounds are normal.     Palpations: Abdomen is soft. There is no shifting dullness, fluid wave, hepatomegaly or splenomegaly.     Tenderness: There is no abdominal tenderness. There is no guarding or rebound.  Neurological:     Mental Status: He is alert.      UC Treatments / Results  Labs (all labs ordered are listed, but only abnormal results are displayed) Labs Reviewed  SARS CORONAVIRUS 2 (TAT 6-24 HRS)    EKG   Radiology No results found.  Procedures Procedures (including critical  care time)  Medications Ordered in UC Medications - No data to display  Initial Impression / Assessment and Plan / UC Course  I have reviewed the triage vital signs and the nursing notes.  Pertinent labs & imaging results that were available during my care of the patient were reviewed by me and considered in my medical decision making (see chart for details).  1.  COPD acute exacerbation: Continue albuterol inhaler use Prednisone 40 mg orally daily for 5 days Azithromycin Ibuprofen as needed for chest pain Mucinex twice daily as needed for thick sputum.  2.  Acute viral pharyngitis: COVID-19 PCR test has been sent If COVID-19 is positive patient will be a candidate for Paxlovid.  GFR is greater than 60 Final Clinical Impressions(s) / UC Diagnoses   Final diagnoses:  Acute viral pharyngitis  COPD with acute exacerbation [facility])     Discharge Instructions      Take medications as prescribed Increase oral fluid intake Will call you with recommendations if labs are abnormal Return to urgent care if you have worsening symptoms.    ED Prescriptions     Medication Sig Dispense Auth. Provider   predniSONE (DELTASONE) 20 MG tablet Take 2 tablets (40 mg total) by mouth daily for 5 days. 10 tablet Shelbey Spindler, Britta Mccreedy, MD   guaiFENesin (MUCINEX) 600 MG 12 hr tablet Take 1 tablet (600 mg total) by mouth 2 (two) times daily for 7 days. 14 tablet Larie Mathes, Britta Mccreedy, MD   benzonatate (TESSALON) 200 MG capsule Take 1 capsule (200 mg total) by mouth 3 (three) times daily as needed for cough. 21 capsule Camillia Marcy, Britta Mccreedy, MD   azithromycin (ZITHROMAX Z-PAK) 250 MG tablet Please take 2 tablets on day 1 then continue with 1 tablet orally daily from day 2-5. 6 tablet Teniola Tseng, Britta Mccreedy, MD      PDMP not reviewed this encounter.   Merrilee Jansky, MD 11/22/22 [phone]

## 2022-11-22 NOTE — Discharge Instructions (Addendum)
Take medications as prescribed Increase oral fluid intake Will call you with recommendations if labs are abnormal Return to urgent care if you have worsening symptoms.

## 2022-11-22 NOTE — ED Triage Notes (Addendum)
Pt repots having left abd pain Saturday when coughed. Pt hurting really bad today when coughing. Takign Tylenol extra strength that helped at first but not today.  Reports cough is non productive.  Reports that "better half is Covid positive". Pt's home covid test yesterday was negative.

## 2022-11-23 ENCOUNTER — Telehealth: Payer: Self-pay | Admitting: Emergency Medicine

## 2022-11-23 ENCOUNTER — Ambulatory Visit: Payer: Managed Care, Other (non HMO) | Admitting: Family Medicine

## 2022-11-23 LAB — SARS CORONAVIRUS 2 (TAT 6-24 HRS): SARS Coronavirus 2: POSITIVE — AB

## 2022-11-23 MED ORDER — NIRMATRELVIR/RITONAVIR (PAXLOVID)TABLET: 3.0000 | ORAL_TABLET | Freq: Two times a day (BID) | ORAL | 0 refills | Status: AC

## 2022-11-29 ENCOUNTER — Telehealth: Payer: Self-pay | Admitting: Family Medicine

## 2022-11-29 DIAGNOSIS — J441 Chronic obstructive pulmonary disease with (acute) exacerbation: Secondary | ICD-10-CM

## 2022-11-29 MED ORDER — ALBUTEROL SULFATE HFA 108 (90 BASE) MCG/ACT IN AERS
2.0000 | INHALATION_SPRAY | Freq: Four times a day (QID) | RESPIRATORY_TRACT | 0 refills | Status: DC | PRN
Start: 2022-11-29 — End: 2022-12-20

## 2022-11-29 NOTE — Telephone Encounter (Signed)
Advised patient that rx has been sent into pharmacy

## 2022-11-29 NOTE — Telephone Encounter (Signed)
Chart supports rx. Last OV: 10/20/2022

## 2022-11-29 NOTE — Telephone Encounter (Signed)
albuterol (VENTOLIN HFA) 108 (90 Base) MCG/ACT inhaler [[id number]]  Pt needs this refilled at [facility] on [address]

## 2022-11-29 NOTE — Telephone Encounter (Signed)
Prescription Request  11/29/2022  LOV: 10/20/2022  What is the name of the medication or equipment? albuterol (VENTOLIN HFA) 108 (90 Base) MCG/ACT inhaler [[id number]]   Have you contacted your pharmacy to request a refill? No   Which pharmacy would you like this sent to?  [facility] [address] [place], [place] - [address] [place] [address] Phone: [phone] Fax: [phone]  [facility] - [facility] [address], [address] [place] [address] Phone: [phone] Fax: [phone]    Patient notified that their request is being sent to the clinical staff for review and that they should receive a response within 2 business days.   Please advise at Mobile [phone] (mobile) pt said sent it to the pharmacy on [address] where you normally send it because it closer to his home

## 2022-12-14 ENCOUNTER — Emergency Department (HOSPITAL_COMMUNITY)
Admission: EM | Admit: 2022-12-14 | Discharge: 2022-12-14 | Disposition: A | Discharge status: Home / Self Care | Payer: Managed Care, Other (non HMO) | Attending: Emergency Medicine | Admitting: Emergency Medicine

## 2022-12-14 ENCOUNTER — Other Ambulatory Visit: Payer: Self-pay

## 2022-12-14 ENCOUNTER — Emergency Department (HOSPITAL_COMMUNITY): Payer: Managed Care, Other (non HMO)

## 2022-12-14 DIAGNOSIS — Z7984 Long term (current) use of oral hypoglycemic drugs: Secondary | ICD-10-CM | POA: Diagnosis not present

## 2022-12-14 DIAGNOSIS — I2510 Atherosclerotic heart disease of native coronary artery without angina pectoris: Secondary | ICD-10-CM | POA: Insufficient documentation

## 2022-12-14 DIAGNOSIS — E119 Type 2 diabetes mellitus without complications: Secondary | ICD-10-CM | POA: Diagnosis not present

## 2022-12-14 DIAGNOSIS — J441 Chronic obstructive pulmonary disease with (acute) exacerbation: Secondary | ICD-10-CM | POA: Insufficient documentation

## 2022-12-14 DIAGNOSIS — R0602 Shortness of breath: Secondary | ICD-10-CM | POA: Diagnosis present

## 2022-12-14 MED ORDER — PREDNISONE 20 MG PO TABS: 40.0000 mg | ORAL_TABLET | Freq: Every day | ORAL | 0 refills | Status: AC

## 2022-12-14 MED ORDER — ALBUTEROL SULFATE HFA 108 (90 BASE) MCG/ACT IN AERS: 2.0000 | INHALATION_SPRAY | RESPIRATORY_TRACT | Status: DC | PRN

## 2022-12-14 NOTE — ED Provider Notes (Signed)
[place] EMERGENCY DEPARTMENT AT [facility] Provider Note   CSN: [id number] Arrival date & time: 12/14/22  1055     History  Chief Complaint  Patient presents with   Shortness of Breath    Sean Martinez is a 62 y.o. male with COPD, T2DM, h/o tobacco abuse who presents with SOB.  Presents w/ gradual onset 2-3 weeks of dyspnea on exertion worsening to this mornign when he was walking to work had to stop every 30-40 feet and catch his breath, +wheezing, then when got to work was SOB at rest. No CP, f/c, recent illnesses. Coughs up clear liquid all the time, for months, not worse right now than usual. No abd pain, N/V/D/C, leg swelling. Received 125 mg IV solumedrol and 2 duonebs w/ EMS and feels much better now.     Shortness of Breath      Home Medications Prior to Admission medications   Medication Sig Start Date End Date Taking? Authorizing Provider  predniSONE (DELTASONE) 20 MG tablet Take 2 tablets (40 mg total) by mouth daily for 5 days. 12/14/22 12/19/22 Yes Loetta Rough, MD  albuterol (VENTOLIN HFA) 108 (90 Base) MCG/ACT inhaler Inhale 2 puffs into the lungs every 6 (six) hours as needed for wheezing or shortness of breath. 11/10/22   Garnette Gunner, MD  albuterol (VENTOLIN HFA) 108 (90 Base) MCG/ACT inhaler Inhale 2 puffs into the lungs every 6 (six) hours as needed for wheezing or shortness of breath. 11/29/22   Garnette Gunner, MD  azithromycin (ZITHROMAX Z-PAK) 250 MG tablet Please take 2 tablets on day 1 then continue with 1 tablet orally daily from day 2-5. 11/22/22   Lamptey, Britta Mccreedy, MD  benzonatate (TESSALON) 200 MG capsule Take 1 capsule (200 mg total) by mouth 3 (three) times daily as needed for cough. 11/22/22   LampteyBritta Mccreedy, MD  Blood Glucose Monitoring Suppl DEVI 1 each by Does not apply route in the morning, at noon, and at bedtime. May substitute to any manufacturer covered by patient's insurance. Patient not taking: Reported on  08/10/2022 07/23/22   Garnette Gunner, MD  metFORMIN (GLUCOPHAGE) 500 MG tablet Take 1 tablet (500 mg total) by mouth daily with breakfast. 10/20/22 11/19/22  Garnette Gunner, MD  nicotine (NICODERM CQ) 21 mg/24hr patch Place 1 patch (21 mg total) onto the skin daily. Patient not taking: Reported on 08/10/2022 07/22/22   Garnette Gunner, MD  umeclidinium-vilanterol [facility] ELLIPTA) 62.5-25 MCG/ACT AEPB Inhale 1 puff into the lungs daily at 6 (six) AM. 11/10/22 01/09/23  Garnette Gunner, MD      Allergies    Penicillins    Review of Systems   Review of Systems  Respiratory:  Positive for shortness of breath.    A 10 point review of systems was performed and is negative unless otherwise reported in HPI.  Physical Exam Updated Vital Signs BP (!) 126/95   Pulse 94   Temp 98.2 F (36.8 C) (Oral)   Resp 18   Ht 5\' 11"  (1.803 m)   Wt 82.6 kg   SpO2 94%   BMI 25.38 kg/m  Physical Exam General: Normal appearing male, lying in bed.  HEENT: Sclera anicteric, MMM, trachea midline.  Cardiology: RRR, no murmurs/rubs/gallops. BL radial and DP pulses equal bilaterally.  Resp: Normal respiratory rate and effort. Mild expiratory wheezing bilaterally. No rhonchi, crackles.  Abd: Soft, non-tender, non-distended. No rebound tenderness or guarding.  GU: Deferred. MSK: No peripheral edema  or signs of trauma. Extremities without deformity or TTP. No cyanosis or clubbing. Skin: warm, dry.  Neuro: A&Ox4, CNs II-XII grossly intact. MAEs. Sensation grossly intact.  Psych: Normal mood and affect.   ED Results / Procedures / Treatments   Labs (all labs ordered are listed, but only abnormal results are displayed) Labs Reviewed - No data to display  EKG None  Radiology DG Chest 2 View  Result Date: 12/14/2022 CLINICAL DATA:  Shortness of breath EXAM: CHEST - 2 VIEW COMPARISON:  05/03/2022 FINDINGS: Heart and mediastinal contours are within normal limits. No focal opacities or effusions. No acute bony  abnormality. IMPRESSION: No active cardiopulmonary disease. Electronically Signed   By: Charlett Nose M.D.   On: 12/14/2022 11:38    Procedures Procedures    Medications Ordered in ED Medications - No data to display  ED Course/ Medical Decision Making/ A&P                          Medical Decision Making Amount and/or Complexity of Data Reviewed Radiology: ordered.  Risk Prescription drug management.    This patient presents to the ED for concern of SOB/DOE w/ wheezing, this involves an extensive number of treatment options, and is a complaint that carries with it a high risk of complications and morbidity.  I considered the following differential and admission for this acute, potentially life threatening condition. Pt is HDS, well-appearing on arrival, feels improved after EMS treatment.  MDM:    DDX for dyspnea includes but is not limited to:  Patient presents with presentation consistent with a COPD exacerbation with symptoms improved with DuoNebs and Solu-Medrol by EMS.  Patient now with only mild expiratory wheezing and no hypoxia, even with ambulation.  He is breathing comfortably with no tachypnea or increased work of breathing.  He has no chest pain to suggest ACS/PE, no crackles or pulmonary edema on chest x-ray to suggest CHF, no symptoms of pneumonia or pneumothorax.  Patient already has inhaler at home.  Will give prescription for prednisone, instructed to schedule his inhaler every 4 hours for the next 2 to 3 days, and instructed follow-up with PCP.  Patient is instructed that he may need further treatment as an outpatient and daily basis for his COPD.  Also will need to see a pulmonologist for PFTs for formal diagnosis and treatment.  Patient reports understanding.  Discharged with discharge instructions and return precautions, all questions answered to patient satisfaction.      Imaging Studies ordered: I ordered imaging studies including CXR I independently  visualized and interpreted imaging. I agree with the radiologist interpretation  Additional history obtained from chart review.    Cardiac Monitoring: The patient was maintained on a cardiac monitor.  I personally viewed and interpreted the cardiac monitored which showed an underlying rhythm of: NSR  Reevaluation: After the interventions noted above, I reevaluated the patient and found that they have :improved  Social Determinants of Health: Patient lives independently  Disposition:  DC w/ discharge instructions/return precautions. All questions answered to patient's satisfaction.    Co morbidities that complicate the patient evaluation  Past Medical History:  Diagnosis Date   Asthma    Coronary artery disease    MI, old    Type 2 diabetes mellitus with hyperglycemia, without long-term current use of insulin (HCC) 08/10/2022     Medicines Meds ordered this encounter  Medications   DISCONTD: albuterol (VENTOLIN HFA) 108 (90 Base) MCG/ACT inhaler  2 puff   predniSONE (DELTASONE) 20 MG tablet    Sig: Take 2 tablets (40 mg total) by mouth daily for 5 days.    Dispense:  10 tablet    Refill:  0    I have reviewed the patients home medicines and have made adjustments as needed  Problem List / ED Course: Problem List Items Addressed This Visit   None Visit Diagnoses     COPD exacerbation (HCC)    -  Primary   Relevant Medications   predniSONE (DELTASONE) 20 MG tablet                   This note was created using dictation software, which may contain spelling or grammatical errors.    Loetta Rough, MD 12/14/22 1345

## 2022-12-14 NOTE — ED Triage Notes (Signed)
Pt BIBA from work. Pt c/o sudden onset of SOB. Pt satting in low 90s on EMS arrival. Diminished lung sounds w/ inspiratory wheezing.  Given 125 mg Solu-medrol, and 2x duo-nebs.  Hx asthma  AOx4

## 2022-12-14 NOTE — Discharge Instructions (Addendum)
Thank you for coming to [facility] Emergency Department. You were seen for shortness of breath. We did an exam, and imaging, and these showed likely a COPD exacerbation. We will prescribe you prednisone to take once per day for 5 days. Please schedule your inhaler every 4 hours for the next 2-3 days and then after that use it as prescribed.  You may need additional daily treatments/inhalers for your COPD. Please follow up with your primary care provider within 1 week.   Do not hesitate to return to the ED or call 911 if you experience: -Worsening symptoms -Lightheadedness, passing out -Fevers/chills -Anything else that concerns you

## 2022-12-14 NOTE — ED Notes (Signed)
Tech was able to ambulate the patient. She reported he tolerated ambulation well and O2 sat remained around 90-92 the majority of the walk.

## 2022-12-20 ENCOUNTER — Other Ambulatory Visit: Payer: Self-pay | Admitting: Family Medicine

## 2022-12-20 DIAGNOSIS — J441 Chronic obstructive pulmonary disease with (acute) exacerbation: Secondary | ICD-10-CM

## 2022-12-20 NOTE — Telephone Encounter (Signed)
Chart supports rx. Last OV: 10/20/2022 Next OV: 12/21/2022

## 2022-12-21 ENCOUNTER — Telehealth: Payer: Self-pay | Admitting: Family Medicine

## 2022-12-21 ENCOUNTER — Ambulatory Visit: Payer: Managed Care, Other (non HMO) | Admitting: Family Medicine

## 2022-12-21 NOTE — Telephone Encounter (Signed)
Pt NS on 7/2 said his power went out. Letter sent

## 2022-12-31 NOTE — Telephone Encounter (Signed)
same day cancel, power went out at pt home, fee waived, letter sent

## 2023-01-13 ENCOUNTER — Encounter (HOSPITAL_COMMUNITY): Payer: Self-pay | Admitting: Emergency Medicine

## 2023-01-13 ENCOUNTER — Ambulatory Visit (HOSPITAL_COMMUNITY)
Admission: EM | Admit: 2023-01-13 | Discharge: 2023-01-13 | Disposition: A | Discharge status: Home / Self Care | Payer: Managed Care, Other (non HMO) | Attending: Internal Medicine | Admitting: Internal Medicine

## 2023-01-13 ENCOUNTER — Telehealth: Payer: Self-pay | Admitting: Family Medicine

## 2023-01-13 DIAGNOSIS — K4090 Unilateral inguinal hernia, without obstruction or gangrene, not specified as recurrent: Secondary | ICD-10-CM | POA: Diagnosis not present

## 2023-01-13 MED ORDER — TRAMADOL HCL 50 MG PO TABS: 50.0000 mg | ORAL_TABLET | Freq: Four times a day (QID) | ORAL | 0 refills | Status: DC | PRN

## 2023-01-13 NOTE — ED Triage Notes (Signed)
Hx hernia for years. When going back to bed during night/early am having pain in right groin that wont stay in when patient now pushing back in. Reports pain.

## 2023-01-13 NOTE — Discharge Instructions (Addendum)
Please increase oral fluid intake Please increase fiber and vegetable intake Make an appointment with the general surgeon to have the hernia repaired If you have worsening pain, persistent swelling that is not reducible-please go to the emergency room to be evaluated.

## 2023-01-13 NOTE — Telephone Encounter (Signed)
Pt would like for you to give him a call back and the pt did not mention about what

## 2023-01-13 NOTE — ED Provider Notes (Signed)
[facility]    CSN: [id number] Arrival date & time: 01/13/23  1831      History   Chief Complaint No chief complaint on file.   HPI Sean Martinez is a 62 y.o. male with a history of inguinal hernia comes to urgent care with worsening right groin pain.  Patient describes sudden onset pain in the right groin with swelling.  Patient was able to reduce the hernia last night.  Today the patient went to work and stayed on his feet the whole day.  Right groin swelling worsened at the end of the day.  The pain is constant, throbbing and currently of moderate severity.  Patient is unable to reduce the inguinal swelling.  He came to the urgent care to be evaluated.  No abdominal distention.  No nausea or vomiting.  Patient denies any history of constipation.  Appetite is fair. HPI  Past Medical History:  Diagnosis Date   Asthma    Coronary artery disease    MI, old    Type 2 diabetes mellitus with hyperglycemia, without long-term current use of insulin (HCC) 08/10/2022    Patient Active Problem List   Diagnosis Date Noted   Nonadherence to medical treatment 10/20/2022   Type 2 diabetes mellitus with hyperglycemia, without long-term current use of insulin (HCC) 08/10/2022   Chronic obstructive pulmonary disease (HCC) 08/10/2022   Chronic cough 07/22/2022   Dyspnea on exertion 07/22/2022   Chest wall pain 07/17/2012   Hypotension, unspecified 05/31/2012   Chest pain 05/30/2012   Right groin pain 05/30/2012   Diarrhea 05/30/2012   Tobacco abuse 05/30/2012    Past Surgical History:  Procedure Laterality Date   HERNIA REPAIR     LEFT HEART CATHETERIZATION WITH CORONARY ANGIOGRAM N/A 07/17/2012   Procedure: LEFT HEART CATHETERIZATION WITH CORONARY ANGIOGRAM;  Surgeon: Sean Pert, MD;  Location: [facility] CATH LAB;  Service: Cardiovascular;  Laterality: N/A;       Home Medications    Prior to Admission medications   Medication Sig Start Date End Date Taking?  Authorizing Provider  traMADol (ULTRAM) 50 MG tablet Take 1 tablet (50 mg total) by mouth every 6 (six) hours as needed. 01/13/23  Yes Sean Martinez, Sean Mccreedy, MD  albuterol (VENTOLIN HFA) 108 (90 Base) MCG/ACT inhaler Inhale 2 puffs into the lungs every 6 (six) hours as needed for wheezing or shortness of breath. 11/10/22   Sean Gunner, MD  Blood Glucose Monitoring Suppl DEVI 1 each by Does not apply route in the morning, at noon, and at bedtime. May substitute to any manufacturer covered by patient's insurance. Patient not taking: Reported on 08/10/2022 07/23/22   Sean Gunner, MD  nicotine (NICODERM CQ) 21 mg/24hr patch Place 1 patch (21 mg total) onto the skin daily. Patient not taking: Reported on 08/10/2022 07/22/22   Sean Gunner, MD    Family History Family History  Problem Relation Age of Onset   Diabetes Mellitus II Mother     Social History Social History   Tobacco Use   Smoking status: Every Day    Current packs/day: 0.25    Types: Cigarettes    Passive exposure: Never   Smokeless tobacco: Never  Vaping Use   Vaping status: Never Used  Substance Use Topics   Alcohol use: Not Currently   Drug use: Not Currently     Allergies   Penicillins   Review of Systems Review of Systems As per HPI  Physical Exam Triage Vital Signs  ED Triage Vitals  Encounter Vitals Group     BP 01/13/23 1849 122/85     Systolic BP Percentile --      Diastolic BP Percentile --      Pulse Rate 01/13/23 1849 (!) 102     Resp 01/13/23 1849 17     Temp 01/13/23 1849 98.6 F (37 C)     Temp Source 01/13/23 1849 Oral     SpO2 01/13/23 1849 94 %     Weight --      Height --      Head Circumference --      Peak Flow --      Pain Score 01/13/23 1848 8     Pain Loc --      Pain Education --      Exclude from Growth Chart --    No data found.  Updated Vital Signs BP 122/85 (BP Location: Right Arm)   Pulse (!) 102   Temp 98.6 F (37 C) (Oral)   Resp 17   SpO2 94%   Visual  Acuity Right Eye Distance:   Left Eye Distance:   Bilateral Distance:    Right Eye Near:   Left Eye Near:    Bilateral Near:     Physical Exam Vitals and nursing note reviewed.  Constitutional:      General: He is not in acute distress.    Appearance: He is not ill-appearing.  Cardiovascular:     Rate and Rhythm: Normal rate and regular rhythm.  Abdominal:     General: Bowel sounds are normal.     Palpations: Abdomen is soft.  Musculoskeletal:     Comments: Right indirect inguinal hernia.  Hernia was reducible.  Skin:    General: Skin is warm.  Neurological:     Mental Status: He is alert.      UC Treatments / Results  Labs (all labs ordered are listed, but only abnormal results are displayed) Labs Reviewed - No data to display  EKG   Radiology No results found.  Procedures Procedures (including critical care time)  Medications Ordered in UC Medications - No data to display  Initial Impression / Assessment and Plan / UC Course  I have reviewed the triage vital signs and the nursing notes.  Pertinent labs & imaging results that were available during my care of the patient were reviewed by me and considered in my medical decision making (see chart for details).     1.  Reducible right inguinal hernia: Tramadol as needed for pain Patient is advised to make an appointment with general surgery for herniorrhaphy Increase fruit and vegetable intake Return precautions given. Final Clinical Impressions(s) / UC Diagnoses   Final diagnoses:  Reducible right inguinal hernia     Discharge Instructions      Please increase oral fluid intake Please increase fiber and vegetable intake Make an appointment with the general surgeon to have the hernia repaired If you have worsening pain, persistent swelling that is not reducible-please go to the emergency room to be evaluated.   ED Prescriptions     Medication Sig Dispense Auth. Provider   traMADol (ULTRAM) 50  MG tablet Take 1 tablet (50 mg total) by mouth every 6 (six) hours as needed. 10 tablet Sean Martinez, Sean Mccreedy, MD      I have reviewed the PDMP during this encounter.   Sean Jansky, MD 01/13/23 [phone]

## 2023-01-14 ENCOUNTER — Telehealth: Payer: Self-pay | Admitting: Family Medicine

## 2023-01-14 ENCOUNTER — Telehealth: Payer: Self-pay

## 2023-01-14 NOTE — Telephone Encounter (Signed)
Left patient a detailed voice message to return call to office.   

## 2023-01-14 NOTE — Telephone Encounter (Signed)
Pt wanted to let you know he has an appt for the surgery for his hernia.

## 2023-01-14 NOTE — Telephone Encounter (Signed)
-----   Message from Terrill B sent at 01/13/2023  7:46 AM EDT ----- Access nurse

## 2023-01-15 ENCOUNTER — Other Ambulatory Visit: Payer: Self-pay | Admitting: Family Medicine

## 2023-01-19 ENCOUNTER — Encounter (INDEPENDENT_AMBULATORY_CARE_PROVIDER_SITE_OTHER): Payer: Self-pay

## 2023-02-02 ENCOUNTER — Ambulatory Visit: Payer: Self-pay | Admitting: Surgery

## 2023-02-09 ENCOUNTER — Encounter: Payer: Self-pay | Admitting: Family Medicine

## 2023-02-09 ENCOUNTER — Ambulatory Visit: Payer: Managed Care, Other (non HMO) | Admitting: Family Medicine

## 2023-02-09 VITALS — BP 128/84 | HR 77 | Temp 97.7°F | Wt 188.8 lb

## 2023-02-09 DIAGNOSIS — Z01818 Encounter for other preprocedural examination: Secondary | ICD-10-CM | POA: Diagnosis not present

## 2023-02-09 DIAGNOSIS — Z7984 Long term (current) use of oral hypoglycemic drugs: Secondary | ICD-10-CM | POA: Diagnosis not present

## 2023-02-09 DIAGNOSIS — E1165 Type 2 diabetes mellitus with hyperglycemia: Secondary | ICD-10-CM

## 2023-02-09 DIAGNOSIS — I252 Old myocardial infarction: Secondary | ICD-10-CM

## 2023-02-09 DIAGNOSIS — K4090 Unilateral inguinal hernia, without obstruction or gangrene, not specified as recurrent: Secondary | ICD-10-CM

## 2023-02-09 DIAGNOSIS — J42 Unspecified chronic bronchitis: Secondary | ICD-10-CM

## 2023-02-09 DIAGNOSIS — Z720 Tobacco use: Secondary | ICD-10-CM

## 2023-02-09 LAB — POCT GLYCOSYLATED HEMOGLOBIN (HGB A1C): Hemoglobin A1C: 10.8 % — AB (ref 4.0–5.6)

## 2023-02-09 MED ORDER — METFORMIN HCL 500 MG PO TABS
1000.0000 mg | ORAL_TABLET | Freq: Two times a day (BID) | ORAL | 3 refills | Status: DC
Start: 2023-02-09 — End: 2023-07-25

## 2023-02-09 MED ORDER — NICOTINE 21 MG/24HR TD PT24
21.0000 mg | MEDICATED_PATCH | Freq: Every day | TRANSDERMAL | 2 refills | Status: DC
Start: 2023-02-09 — End: 2023-02-25

## 2023-02-09 MED ORDER — ALBUTEROL SULFATE HFA 108 (90 BASE) MCG/ACT IN AERS
2.0000 | INHALATION_SPRAY | RESPIRATORY_TRACT | 3 refills | Status: DC | PRN
Start: 2023-02-09 — End: 2023-06-06

## 2023-02-09 MED ORDER — ROSUVASTATIN CALCIUM 5 MG PO TABS
5.0000 mg | ORAL_TABLET | Freq: Every day | ORAL | 3 refills | Status: DC
Start: 2023-02-09 — End: 2024-01-25

## 2023-02-09 NOTE — Progress Notes (Unsigned)
Assessment/Plan:   Problem List Items Addressed This Visit       Respiratory   Chronic obstructive pulmonary disease (HCC)    Exacerbated COPD symptoms due to continued smoking. Ongoing cough, occasional dizziness, using albuterol inhaler  Plan: Other controller inhalers open cough for the past. Refill albuterol inhaler 2 puffs every 4 hours as needed Recommend pulmonologist consultation for advanced management Discuss smoking cessation; prescribed nicotine patches      Relevant Medications   albuterol (VENTOLIN HFA) 108 (90 Base) MCG/ACT inhaler   nicotine (NICODERM CQ) 21 mg/24hr patch   Other Relevant Orders   Ambulatory referral to Pulmonology     Endocrine   Type 2 diabetes mellitus with hyperglycemia, without long-term current use of insulin (HCC)    Poorly controlled with recent A1C of 10.8%  Plan: Start Metformin 1000 mg twice daily Dietary adjustments to limit high carbohydrate intake Schedule follow-up in 1 month to monitor glucose levels      Relevant Medications   metFORMIN (GLUCOPHAGE) 500 MG tablet   rosuvastatin (CRESTOR) 5 MG tablet   Other Relevant Orders   POCT glycosylated hemoglobin (Hb A1C) (Completed)     Other   Tobacco abuse    No new chest pain, stable cardiovascular status  Plan: Prescribe Rosuvastatin 5 mg daily for primary cardiovascular protection Recommend cardiologist consultation for surgical clearance      Relevant Medications   nicotine (NICODERM CQ) 21 mg/24hr patch   History of MI (myocardial infarction)   Relevant Medications   rosuvastatin (CRESTOR) 5 MG tablet   Other Relevant Orders   Ambulatory referral to Cardiology   Right inguinal hernia   Pre-op examination - Primary   Relevant Orders   Ambulatory referral to Pulmonology   Ambulatory referral to Cardiology    Medications Discontinued During This Encounter  Medication Reason   nicotine (NICODERM CQ) 21 mg/24hr patch Reorder   albuterol (VENTOLIN HFA) 108  (90 Base) MCG/ACT inhaler Reorder    Return in about 4 weeks (around 03/09/2023) for DM, smoking.    Subjective:   Encounter date: 02/09/2023  Sean Martinez is a 62 y.o. male who has Chest pain; Right groin pain; Diarrhea; Tobacco abuse; Hypotension, unspecified; Chest wall pain; Chronic cough; Dyspnea on exertion; Type 2 diabetes mellitus with hyperglycemia, without long-term current use of insulin (HCC); Chronic obstructive pulmonary disease (HCC); Nonadherence to medical treatment; History of MI (myocardial infarction); Right inguinal hernia; and Pre-op examination on their problem list..   He  has a past medical history of Asthma, Coronary artery disease, MI, old, and Type 2 diabetes mellitus with hyperglycemia, without long-term current use of insulin (HCC) (08/10/2022)..   Chief Complaint: Patient requires clearance for hernia surgery.  History of Present Illness:  Hernia Surgery Clearance. Patient needs clearance for a right inguinal hernia surgery scheduled with Dr. Michaell Cowing. The hernia has been problematic but not worsened. Focus is on ensuring heart and lung health to clear for surgery.  Cardiovascular Health. Patient has a history of a heart attack more than 10 years ago. No new episodes reported.  Respiratory Health. The patient reports a history of COPD, with recent exacerbations at the end of June. Uses albuterol inhaler but mentions occasional dizziness with coughing. Continues to smoke around a quarter of a pack a day since age 83 (approximately 45 years).  Diabetes Management. Patient has a significant increase in A1C from 6.9 to 10.8 over the past three months, attributed possibly to increased consumption of pastries.  Review of  Systems  Constitutional:  Negative for chills, diaphoresis, fever, malaise/fatigue and weight loss.  HENT:  Negative for congestion, ear discharge, ear pain and hearing loss.   Eyes:  Negative for blurred vision, double vision, photophobia,  pain, discharge and redness.  Respiratory:  Positive for cough and shortness of breath. Negative for sputum production and wheezing.   Cardiovascular:  Negative for chest pain and palpitations.  Gastrointestinal:  Negative for abdominal pain, blood in stool, constipation, diarrhea, heartburn, melena, nausea and vomiting.  Genitourinary:  Negative for dysuria, flank pain, frequency, hematuria and urgency.  Musculoskeletal:  Negative for myalgias.  Skin:  Negative for itching and rash.  Neurological:  Positive for dizziness. Negative for tingling, tremors, speech change, seizures, loss of consciousness, weakness and headaches.  Endo/Heme/Allergies:  Negative for polydipsia.  Psychiatric/Behavioral:  Negative for depression, hallucinations, memory loss, substance abuse and suicidal ideas. The patient does not have insomnia.   All other systems reviewed and are negative.   Past Surgical History:  Procedure Laterality Date   HERNIA REPAIR     LEFT HEART CATHETERIZATION WITH CORONARY ANGIOGRAM N/A 07/17/2012   Procedure: LEFT HEART CATHETERIZATION WITH CORONARY ANGIOGRAM;  Surgeon: Pamella Pert, MD;  Location: [facility] CATH LAB;  Service: Cardiovascular;  Laterality: N/A;    Outpatient Medications Prior to Visit  Medication Sig Dispense Refill   albuterol (VENTOLIN HFA) 108 (90 Base) MCG/ACT inhaler INHALE 2 PUFFS BY MOUTH EVERY 6 HOURS AS NEEDED FOR WHEEZING FOR SHORTNESS OF BREATH 18 g 0   Blood Glucose Monitoring Suppl DEVI 1 each by Does not apply route in the morning, at noon, and at bedtime. May substitute to any manufacturer covered by patient's insurance. (Patient not taking: Reported on 08/10/2022) 1 each 0   traMADol (ULTRAM) 50 MG tablet Take 1 tablet (50 mg total) by mouth every 6 (six) hours as needed. (Patient not taking: Reported on 02/09/2023) 10 tablet 0   nicotine (NICODERM CQ) 21 mg/24hr patch Place 1 patch (21 mg total) onto the skin daily. (Patient not taking: Reported on  08/10/2022) 28 patch 0   No facility-administered medications prior to visit.    Family History  Problem Relation Age of Onset   Diabetes Mellitus II Mother     Social History   Socioeconomic History   Marital status: Single    Spouse name: Not on file   Number of children: Not on file   Years of education: Not on file   Highest education level: Not on file  Occupational History   Not on file  Tobacco Use   Smoking status: Every Day    Average packs/day: 0.3 packs/day for 46.6 years (11.7 ttl pk-yrs)    Types: Cigarettes    Start date: 65    Passive exposure: Never   Smokeless tobacco: Never  Vaping Use   Vaping status: Never Used  Substance and Sexual Activity   Alcohol use: Not Currently   Drug use: Not Currently   Sexual activity: Not on file  Other Topics Concern   Not on file  Social History Narrative   Not on file   Social Determinants of Health   Financial Resource Strain: Not on file  Food Insecurity: Not on file  Transportation Needs: Not on file  Physical Activity: Not on file  Stress: Not on file  Social Connections: Not on file  Intimate Partner Violence: Not on file  Objective:  Physical Exam: BP 128/84 (BP Location: Left Arm, Patient Position: Sitting, Cuff Size: Large)   Pulse 77   Temp 97.7 F (36.5 C) (Temporal)   Wt 188 lb 12.8 oz (85.6 kg)   SpO2 99%   BMI 26.33 kg/m     Physical Exam Constitutional:      Appearance: Normal appearance.  HENT:     Head: Normocephalic and atraumatic.     Right Ear: Hearing normal.     Left Ear: Hearing normal.     Nose: Nose normal.  Eyes:     General: No scleral icterus.       Right eye: No discharge.        Left eye: No discharge.     Extraocular Movements: Extraocular movements intact.  Cardiovascular:     Rate and Rhythm: Normal rate and regular rhythm.     Heart sounds: Normal heart  sounds.  Pulmonary:     Effort: Pulmonary effort is normal.     Breath sounds: Normal breath sounds.  Abdominal:     Palpations: Abdomen is soft.     Tenderness: There is no abdominal tenderness.  Skin:    General: Skin is warm.     Findings: No rash.  Neurological:     General: No focal deficit present.     Mental Status: He is alert.     Cranial Nerves: No cranial nerve deficit.  Psychiatric:        Mood and Affect: Mood normal.        Behavior: Behavior normal.        Thought Content: Thought content normal.        Judgment: Judgment normal.     DG Chest 2 View  Result Date: 12/14/2022 CLINICAL DATA:  Shortness of breath EXAM: CHEST - 2 VIEW COMPARISON:  05/03/2022 FINDINGS: Heart and mediastinal contours are within normal limits. No focal opacities or effusions. No acute bony abnormality. IMPRESSION: No active cardiopulmonary disease. Electronically Signed   By: Charlett Nose M.D.   On: 12/14/2022 11:38    Recent Results (from the past 2160 hour(s))  SARS CORONAVIRUS 2 (TAT 6-24 HRS) Anterior Nasal Swab     Status: Abnormal   Collection Time: 11/22/22 12:41 PM   Specimen: Anterior Nasal Swab  Result Value Ref Range   SARS Coronavirus 2 POSITIVE (A) NEGATIVE    Comment: (NOTE) SARS-CoV-2 target nucleic acids are DETECTED.  The SARS-CoV-2 RNA is generally detectable in upper and lower respiratory specimens during the acute phase of infection. Positive results are indicative of the presence of SARS-CoV-2 RNA. Clinical correlation with patient history and other diagnostic information is  necessary to determine patient infection status. Positive results do not rule out bacterial infection or co-infection with other viruses.  The expected result is Negative.  Fact Sheet for Patients: HairSlick.no  Fact Sheet for Healthcare Providers: quierodirigir.com  This test is not yet approved or cleared by the [place] FDA  and  has been authorized for detection and/or diagnosis of SARS-CoV-2 by FDA under an Emergency Use Authorization (EUA). This EUA will remain  in effect (meaning this test can be used) for the duration of the COVID-19 declaration under Section 564(b)(1) of the Act, 21 U. S.C. section 360bbb-3(b)(1), unless the authorization is terminated or revoked sooner.   Performed at [facility], [address] [place], [place] [address]   POCT glycosylated hemoglobin (Hb A1C)     Status: Abnormal   Collection Time: 02/09/23  10:53 AM  Result Value Ref Range   Hemoglobin A1C 10.8 (A) 4.0 - 5.6 %   HbA1c POC (<> result, manual entry)     HbA1c, POC (prediabetic range)     HbA1c, POC (controlled diabetic range)          Garner Nash, MD, MS

## 2023-02-09 NOTE — Patient Instructions (Signed)
Take Metformin 1000mg  twice a day to help manage your diabetes. Monitor your blood sugar levels a few times a week using your glucometer, especially in the morning. Begin using the 21mg  nicotine patches to help reduce and eventually stop smoking. Refill and use the albuterol inhaler as needed, up to two puffs every four hours. Schedule follow-up appointments with a cardiologist and pulmonologist to get clearance for your hernia surgery.

## 2023-02-10 NOTE — Assessment & Plan Note (Signed)
No new chest pain, stable cardiovascular status  Plan: Prescribe Rosuvastatin 5 mg daily for primary cardiovascular protection Recommend cardiologist consultation for surgical clearance

## 2023-02-10 NOTE — Assessment & Plan Note (Signed)
Poorly controlled with recent A1C of 10.8%  Plan: Start Metformin 1000 mg twice daily Dietary adjustments to limit high carbohydrate intake Schedule follow-up in 1 month to monitor glucose levels

## 2023-02-10 NOTE — Assessment & Plan Note (Signed)
Exacerbated COPD symptoms due to continued smoking. Ongoing cough, occasional dizziness, using albuterol inhaler  Plan: Other controller inhalers open cough for the past. Refill albuterol inhaler 2 puffs every 4 hours as needed Recommend pulmonologist consultation for advanced management Discuss smoking cessation; prescribed nicotine patches

## 2023-02-16 ENCOUNTER — Telehealth: Payer: Self-pay | Admitting: *Deleted

## 2023-02-16 NOTE — Telephone Encounter (Signed)
   Pre-operative Risk Assessment    Patient Name: Sean Martinez  DOB: 1961-01-28 MRN: [id number]      Request for Surgical Clearance    Procedure:   Bilateral Inguinal Hernia Repair with Mesh.  Date of Surgery:  Clearance TBD                                 Surgeon:  Dr. Karie Soda Surgeon's Group or Practice Name:  [facility] Phone number:  [phone] Fax number:  [phone]   Type of Clearance Requested:   - Medical    Type of Anesthesia:  General    Additional requests/questions:  Pt has upcoming appt with Dr. Tresa Endo on 04/14/2023.  Pre op added to appt notes.   Signed, Emmit Pomfret   02/16/2023, 10:41 AM

## 2023-02-16 NOTE — Telephone Encounter (Signed)
   Name: Sean Martinez  DOB: [birth year]  MRN: [id number]  Primary Cardiologist: None  Chart reviewed as part of pre-operative protocol coverage. Because of Sean Martinez's past medical history and time since last visit, he will require a follow-up in-office visit in order to better assess preoperative cardiovascular risk.  Patient has an office visit scheduled with Dr. Wyline Mood on 04/14/2023. Appointment notes have been updated to reflect need for pre-op evaluation.   Pre-op covering staff:  - Please contact requesting surgeon's office via preferred method (i.e, phone, fax) to inform them of need for appointment prior to surgery.   Carlos Levering, NP  02/16/2023, 3:54 PM

## 2023-02-25 ENCOUNTER — Telehealth: Payer: Self-pay | Admitting: Family Medicine

## 2023-02-25 DIAGNOSIS — Z720 Tobacco use: Secondary | ICD-10-CM

## 2023-02-25 MED ORDER — NICOTINE 21 MG/24HR TD PT24
21.0000 mg | MEDICATED_PATCH | Freq: Every day | TRANSDERMAL | 2 refills | Status: DC
Start: 2023-02-25 — End: 2023-03-14

## 2023-02-25 NOTE — Telephone Encounter (Signed)
Chart supports rx. Last OV: 02/09/2023 Next OV: 03/14/2023

## 2023-02-25 NOTE — Telephone Encounter (Signed)
Pt should have 2 refills remaining, but rx was printed at last visit. Tried to contact pharmacy to confirm if his nicoderm CQ has any refills there, but they were Not Available - Out for lunch. Will call back after 2pm.

## 2023-02-25 NOTE — Addendum Note (Signed)
Addended by: Trudee Kuster on: 02/25/2023 02:28 PM   Modules accepted: Orders

## 2023-02-25 NOTE — Telephone Encounter (Signed)
nicotine (NICODERM CQ) 21 mg/24hr patch [[id number]]  Please refill to [facility] on [facility].

## 2023-03-04 ENCOUNTER — Telehealth: Payer: Self-pay | Admitting: Family Medicine

## 2023-03-04 NOTE — Telephone Encounter (Signed)
Pt has 3 refills remaining at pharmacy.   Patient is aware and verbalized understanding.

## 2023-03-04 NOTE — Telephone Encounter (Signed)
Prescription Request  03/04/2023  LOV: 02/09/2023  What is the name of the medication or equipment? albuterol (VENTOLIN HFA) 108 (90 Base) MCG/ACT inhaler [[id number]]   Have you contacted your pharmacy to request a refill? No   Which pharmacy would you like this sent to?  [facility] [address] [place], [place] - [address] [place] [address] Phone: [phone] Fax: [phone]   Patient notified that their request is being sent to the clinical staff for review and that they should receive a response within 2 business days.   Please advise at Mobile [phone] (mobile)   Pt called and said he misplaced his

## 2023-03-14 ENCOUNTER — Encounter: Payer: Self-pay | Admitting: Family Medicine

## 2023-03-14 ENCOUNTER — Ambulatory Visit (INDEPENDENT_AMBULATORY_CARE_PROVIDER_SITE_OTHER): Payer: Managed Care, Other (non HMO) | Admitting: Family Medicine

## 2023-03-14 VITALS — BP 124/84 | HR 79 | Temp 97.9°F | Wt 190.0 lb

## 2023-03-14 DIAGNOSIS — J410 Simple chronic bronchitis: Secondary | ICD-10-CM

## 2023-03-14 DIAGNOSIS — E1165 Type 2 diabetes mellitus with hyperglycemia: Secondary | ICD-10-CM

## 2023-03-14 DIAGNOSIS — Z720 Tobacco use: Secondary | ICD-10-CM

## 2023-03-14 DIAGNOSIS — Z7984 Long term (current) use of oral hypoglycemic drugs: Secondary | ICD-10-CM | POA: Diagnosis not present

## 2023-03-14 DIAGNOSIS — Z23 Encounter for immunization: Secondary | ICD-10-CM

## 2023-03-14 MED ORDER — IPRATROPIUM-ALBUTEROL 0.5-2.5 (3) MG/3ML IN SOLN: 3.0000 mL | Freq: Four times a day (QID) | RESPIRATORY_TRACT | 0 refills | Status: DC | PRN

## 2023-03-14 MED ORDER — UMECLIDINIUM-VILANTEROL 62.5-25 MCG/ACT IN AEPB
1.0000 | INHALATION_SPRAY | Freq: Every day | RESPIRATORY_TRACT | 0 refills | Status: DC
Start: 2023-03-14 — End: 2023-05-12

## 2023-03-14 MED ORDER — NICOTINE 10 MG/ML NA SOLN: NASAL | 1 refills | Status: DC

## 2023-03-14 NOTE — Progress Notes (Unsigned)
Assessment/Plan:   Problem List Items Addressed This Visit   None Visit Diagnoses     Immunization due    -  Primary   Relevant Orders   Flu vaccine trivalent PF, 6mos and older(Flulaval,Afluria,Fluarix,Fluzone)       There are no discontinued medications.  No follow-ups on file.    Subjective:   Encounter date: 03/14/2023  Sean Martinez is a 62 y.o. male who has Chest pain; Right groin pain; Diarrhea; Tobacco abuse; Hypotension, unspecified; Chest wall pain; Chronic cough; Dyspnea on exertion; Type 2 diabetes mellitus with hyperglycemia, without long-term current use of insulin (HCC); Chronic obstructive pulmonary disease (HCC); Nonadherence to medical treatment; History of MI (myocardial infarction); Right inguinal hernia; and Pre-op examination on their problem list..   He  has a past medical history of Asthma, Coronary artery disease, MI, old, and Type 2 diabetes mellitus with hyperglycemia, without long-term current use of insulin (HCC) (08/10/2022).Marland Kitchen   He presents with chief complaint of Medical Management of Chronic Issues (4 weeks (around 03/09/2023) for DM, smoking. Patches didn't work . Called patient. Patient would like to discuss colonoscopy options ) .   HPI:   ROS  Past Surgical History:  Procedure Laterality Date   HERNIA REPAIR     LEFT HEART CATHETERIZATION WITH CORONARY ANGIOGRAM N/A 07/17/2012   Procedure: LEFT HEART CATHETERIZATION WITH CORONARY ANGIOGRAM;  Surgeon: Pamella Pert, MD;  Location: [facility] CATH LAB;  Service: Cardiovascular;  Laterality: N/A;    Outpatient Medications Prior to Visit  Medication Sig Dispense Refill   albuterol (VENTOLIN HFA) 108 (90 Base) MCG/ACT inhaler Inhale 2 puffs into the lungs every 4 (four) hours as needed for wheezing or shortness of breath. 18 g 3   metFORMIN (GLUCOPHAGE) 500 MG tablet Take 2 tablets (1,000 mg total) by mouth 2 (two) times daily with a meal. 180 tablet 3   rosuvastatin (CRESTOR) 5 MG tablet  Take 1 tablet (5 mg total) by mouth daily. 90 tablet 3   Blood Glucose Monitoring Suppl DEVI 1 each by Does not apply route in the morning, at noon, and at bedtime. May substitute to any manufacturer covered by patient's insurance. (Patient not taking: Reported on 08/10/2022) 1 each 0   nicotine (NICODERM CQ) 21 mg/24hr patch Place 1 patch (21 mg total) onto the skin daily. (Patient not taking: Reported on 03/14/2023) 30 patch 2   traMADol (ULTRAM) 50 MG tablet Take 1 tablet (50 mg total) by mouth every 6 (six) hours as needed. (Patient not taking: Reported on 02/09/2023) 10 tablet 0   No facility-administered medications prior to visit.    Family History  Problem Relation Age of Onset   Diabetes Mellitus II Mother     Social History   Socioeconomic History   Marital status: Single    Spouse name: Not on file   Number of children: Not on file   Years of education: Not on file   Highest education level: Not on file  Occupational History   Not on file  Tobacco Use   Smoking status: Every Day    Average packs/day: 0.3 packs/day for 46.6 years (11.7 ttl pk-yrs)    Types: Cigarettes    Start date: 88    Passive exposure: Never   Smokeless tobacco: Never  Vaping Use   Vaping status: Never Used  Substance and Sexual Activity   Alcohol use: Not Currently   Drug use: Not Currently   Sexual activity: Not on file  Other Topics Concern  Not on file  Social History Narrative   Not on file   Social Determinants of Health   Financial Resource Strain: Not on file  Food Insecurity: Not on file  Transportation Needs: Not on file  Physical Activity: Not on file  Stress: Not on file  Social Connections: Not on file  Intimate Partner Violence: Not on file                                                                                                  Objective:  Physical Exam: BP 124/84 (BP Location: Left Arm, Patient Position: Sitting, Cuff Size: Large)   Pulse 79   Temp 97.9  F (36.6 C) (Temporal)   Wt 190 lb (86.2 kg)   SpO2 100%   BMI 26.50 kg/m     Physical Exam  No results found.  Recent Results (from the past 2160 hour(s))  POCT glycosylated hemoglobin (Hb A1C)     Status: Abnormal   Collection Time: 02/09/23 10:53 AM  Result Value Ref Range   Hemoglobin A1C 10.8 (A) 4.0 - 5.6 %   HbA1c POC (<> result, manual entry)     HbA1c, POC (prediabetic range)     HbA1c, POC (controlled diabetic range)          Garner Nash, MD, MS

## 2023-03-16 NOTE — Assessment & Plan Note (Signed)
Persistent, without improvement of nicotine patches   plan: Start nicotine nasal spray, 1-2 sprays per hour, up to 8 doses/day. Continue evaluating other cessation aids as needed.

## 2023-03-16 NOTE — Assessment & Plan Note (Signed)
Poorly controlled, likely recently worse due to steroid exposure.  Plan: Continue metformin 1000 mg daily. Monitor blood glucose levels at home. Scheduled follow-up HbA1c in 3 months.

## 2023-03-16 NOTE — Assessment & Plan Note (Signed)
Chronic with periodic exacerbations.  Plan: Continue albuterol inhaler as needed. Restart Anoro Ellipta once daily. Prescribe DuoNeb for nebulizer use at home. Follow-up with pulmonologist for further management.

## 2023-03-17 ENCOUNTER — Telehealth: Payer: Self-pay | Admitting: Family Medicine

## 2023-03-17 NOTE — Telephone Encounter (Signed)
[facility] called pt insurance not in network and birthdate is also wrong on card . Pt has to call [employer]ace to update his birth date ( [facility]  [phone] )

## 2023-03-17 NOTE — Telephone Encounter (Signed)
Reached out to pt and advised him I would send the DME order for nebulizer to [facility] at [address] [place], [place] [address]. I faxed DME order from OV with Dr. Janee Morn on 03/14/2023 to [phone].

## 2023-03-17 NOTE — Telephone Encounter (Signed)
[facility] called to say that a script was sent in for medicine for a nebulizer but not the machine to use. [facility] told him they do not carry those. He is asking for help what needs to happen now?  Please call [phone]

## 2023-03-24 ENCOUNTER — Telehealth: Payer: Self-pay | Admitting: Family Medicine

## 2023-03-24 NOTE — Telephone Encounter (Signed)
Patient needs an appointment

## 2023-03-24 NOTE — Telephone Encounter (Signed)
Called pt and he will call to schedule when he gets his schedule tomorrow.

## 2023-03-24 NOTE — Telephone Encounter (Signed)
Forgot to route

## 2023-03-24 NOTE — Telephone Encounter (Signed)
Pt expressed that his hernia is so painful he is having to take a day off work every week.  He would like a call back to discuss.

## 2023-03-30 ENCOUNTER — Encounter: Payer: Self-pay | Admitting: Family Medicine

## 2023-03-30 ENCOUNTER — Ambulatory Visit (INDEPENDENT_AMBULATORY_CARE_PROVIDER_SITE_OTHER): Payer: Managed Care, Other (non HMO) | Admitting: Family Medicine

## 2023-03-30 VITALS — BP 124/82 | HR 98 | Temp 98.4°F | Ht 71.0 in | Wt 187.2 lb

## 2023-03-30 DIAGNOSIS — K4090 Unilateral inguinal hernia, without obstruction or gangrene, not specified as recurrent: Secondary | ICD-10-CM | POA: Diagnosis not present

## 2023-03-30 NOTE — Progress Notes (Signed)
[facility] [address] [place] [address] Dept: [phone] Dept Fax: [phone]  Office Visit  Subjective:    Patient ID: Jomarie Longs, male    DOB: [birth year], 62 y.o..   MRN: [id number]  Chief Complaint  Patient presents with   Hernia    C/o having hernia pain.  Has been missing 1 day a week.  Very painful.    History of Present Illness:  Patient is in today requesting assistance to get his hernia surgery completed more quickly. Mr. Manrique was seen on 01/13/2023 at Urgent care with a right inguinal hernia. He was seen on 8/14 by Dr. Michaell Cowing with [facility]. He saw Dr. Janee Morn on 8/21 for a preoperative assessment. He was referred to pulmonology and cardiology for surgical clearance as well. He has a pending appointment with cardiology on 10/24.   The hernia continues to bulge at times. he feels it takes longer to reduce this, but that it does fully reduce. He is using a hernia belt for support. He has increased his fiber to keep his bowel movements soft. he is avoiding heavy lifting.  Past Medical History: Patient Active Problem List   Diagnosis Date Noted   History of MI (myocardial infarction) 02/09/2023   Nonadherence to medical treatment 10/20/2022   Type 2 diabetes mellitus with hyperglycemia, without long-term current use of insulin (HCC) 08/10/2022   Chronic cough 07/22/2022   Chronic obstructive pulmonary disease (HCC) 07/22/2022   Tobacco abuse 05/30/2012   Right inguinal hernia 05/30/2012   Past Surgical History:  Procedure Laterality Date   HERNIA REPAIR     LEFT HEART CATHETERIZATION WITH CORONARY ANGIOGRAM N/A 07/17/2012   Procedure: LEFT HEART CATHETERIZATION WITH CORONARY ANGIOGRAM;  Surgeon: Pamella Pert, MD;  Location: [facility] CATH LAB;  Service: Cardiovascular;  Laterality: N/A;   Family History  Problem Relation Age of Onset   Diabetes Mellitus II Mother    Outpatient  Medications Prior to Visit  Medication Sig Dispense Refill   albuterol (VENTOLIN HFA) 108 (90 Base) MCG/ACT inhaler Inhale 2 puffs into the lungs every 4 (four) hours as needed for wheezing or shortness of breath. 18 g 3   Blood Glucose Monitoring Suppl DEVI 1 each by Does not apply route in the morning, at noon, and at bedtime. May substitute to any manufacturer covered by patient's insurance. 1 each 0   ipratropium-albuterol (DUONEB) 0.5-2.5 (3) MG/3ML SOLN Take 3 mLs by nebulization every 6 (six) hours as needed (shortness of breath, wheezing, or cough). 360 mL 0   metFORMIN (GLUCOPHAGE) 500 MG tablet Take 2 tablets (1,000 mg total) by mouth 2 (two) times daily with a meal. (Patient taking differently: Take 1,000 mg by mouth 2 (two) times daily with a meal. 1 tablet in AM and 1 Tablet in pm) 180 tablet 3   Nicotine 10 MG/ML SOLN Use 1 spray in each nostril every 1-2 hours as needed 10 mL 1   rosuvastatin (CRESTOR) 5 MG tablet Take 1 tablet (5 mg total) by mouth daily. 90 tablet 3   traMADol (ULTRAM) 50 MG tablet Take 1 tablet (50 mg total) by mouth every 6 (six) hours as needed. 10 tablet 0   umeclidinium-vilanterol (ANORO ELLIPTA) 62.5-25 MCG/ACT AEPB Inhale 1 puff into the lungs daily. 90 each 0   No facility-administered medications prior to visit.   Allergies  Allergen Reactions   Penicillins Hives    Has patient had a PCN reaction causing immediate rash,  facial/tongue/throat swelling, SOB or lightheadedness with hypotension: Yes Has patient had a PCN reaction causing severe rash involving mucus membranes or skin necrosis: No Has patient had a PCN reaction that required hospitalization: No Has patient had a PCN reaction occurring within the last 10 years: No If all of the above answers are "NO", then may proceed with Cephalosporin use.      Objective:   Today's Vitals   03/30/23 1332  BP: 124/82  Pulse: 98  Temp: 98.4 F (36.9 C)  TempSrc: Temporal  SpO2: 96%  Weight: 187 lb  3.2 oz (84.9 kg)  Height: 5\' 11"  (1.803 m)   Body mass index is 26.11 kg/m.   General: Well developed, well nourished. No acute distress. Psych: Alert and oriented. Normal mood and affect.  Health Maintenance Due  Topic Date Due   FOOT EXAM  Never done   Hepatitis C Screening  Never done   DTaP/Tdap/Td (1 - Tdap) Never done   Colonoscopy  Never done   Zoster Vaccines- Shingrix (1 of 2) Never done   INFLUENZA VACCINE  01/20/2023     Assessment & Plan:   Problem List Items Addressed This Visit       Other   Right inguinal hernia - Primary    I do not have a way of speeding up the process for Mr. Rabine to have his surgery. I explained the importance of him having cardiac clearance before proceeding. He is following appropriate approaches to reduce worsening of his hernia. We did review indications to seek emergent evaluation that could indicate an incarcerated hernia. I recommend he use Tylenol 500 mg plus ibuprofen 200-400 mg as needed for pain.       Return if symptoms worsen or fail to improve.   Loyola Mast, MD

## 2023-03-30 NOTE — Assessment & Plan Note (Addendum)
I do not have a way of speeding up the process for Sean Martinez to have his surgery. I explained the importance of him having cardiac clearance before proceeding. He is following appropriate approaches to reduce worsening of his hernia. We did review indications to seek emergent evaluation that could indicate an incarcerated hernia. I recommend he use Tylenol 500 mg plus ibuprofen 200-400 mg as needed for pain.

## 2023-04-14 ENCOUNTER — Encounter: Payer: Self-pay | Admitting: Cardiovascular Disease

## 2023-04-14 ENCOUNTER — Ambulatory Visit: Payer: Managed Care, Other (non HMO) | Attending: Cardiovascular Disease | Admitting: Cardiovascular Disease

## 2023-04-14 DIAGNOSIS — E785 Hyperlipidemia, unspecified: Secondary | ICD-10-CM

## 2023-04-14 DIAGNOSIS — Z01810 Encounter for preprocedural cardiovascular examination: Secondary | ICD-10-CM

## 2023-04-14 DIAGNOSIS — E1165 Type 2 diabetes mellitus with hyperglycemia: Secondary | ICD-10-CM

## 2023-04-14 DIAGNOSIS — J42 Unspecified chronic bronchitis: Secondary | ICD-10-CM | POA: Diagnosis not present

## 2023-04-14 DIAGNOSIS — K4090 Unilateral inguinal hernia, without obstruction or gangrene, not specified as recurrent: Secondary | ICD-10-CM

## 2023-04-14 DIAGNOSIS — Z720 Tobacco use: Secondary | ICD-10-CM

## 2023-04-14 DIAGNOSIS — I252 Old myocardial infarction: Secondary | ICD-10-CM | POA: Diagnosis not present

## 2023-04-14 NOTE — Progress Notes (Signed)
Cardiology Office Note    Date:  04/14/2023   ID:  Sean Martinez, DOB [birth year], MRN [id number]  PCP:  Garnette Gunner, MD  Cardiologist:  Nicki Guadalajara, MD   New cardiology pre-operative evaluation referred by Dr. Karie Soda prior to undergoing inguinal hernia surgery.   History of Present Illness:  Sean Martinez is a 62 y.o. male who has a longstanding tobacco history and continues to smoke for over 40 years.  He followed by Dr. Fanny Bien for primary care and has COPD with periodic exacerbations.  He has a history of type 2 diabetes mellitus without insulin and had recently stopped his metformin but more recently was restarted back on therapy.  Moderately, on May 31, 2012, he had undergone a 2D echo Doppler study which showed an EF of 45 to 50%.  Valves were essentially normal.  Patient states that approximately 10 years ago he was told of having a heart attack.  He developed an episode of chest burning leading to an ER evaluation and again had chest pain 6 weeks later prompting cardiac catheterization which was performed by Dr. Yates Decamp in [place].  I was able to obtain the catheterization report from July 17, 2012 and this revealed normal coronary arteries arguing against a prior MI.  Currently he remains fairly active walking in his [employer] and at times walking to work.  He denies any exertional precipitation of chest pain.  Recently found to have a large right inguinal hernia and apparently was evaluated by Dr. Star Age.  He is now referred for preoperative cardiology evaluation prior to scheduling his hernia repair surgery.  Remotely, the patient states that at age 62 he had undergone a prior hernia repair.  He has a history of hyperlipidemia and has been on rosuvastatin 5 mg.  Laboratory in February 2024 showed total cholesterol 143, HDL 74, triglycerides of 71, and LDL 54.  When he had stopped his metformin, hemoglobin A1c had increased to 10.8 and  he is now back on therapy.  He presents for preoperative cardiology evaluation.  Past Medical History:  Diagnosis Date   Asthma    Coronary artery disease    MI, old    Type 2 diabetes mellitus with hyperglycemia, without long-term current use of insulin (HCC) 08/10/2022    Past Surgical History:  Procedure Laterality Date   HERNIA REPAIR     LEFT HEART CATHETERIZATION WITH CORONARY ANGIOGRAM N/A 07/17/2012   Procedure: LEFT HEART CATHETERIZATION WITH CORONARY ANGIOGRAM;  Surgeon: Pamella Pert, MD;  Location: [facility] CATH LAB;  Service: Cardiovascular;  Laterality: N/A;    Current Medications: Outpatient Medications Prior to Visit  Medication Sig Dispense Refill   albuterol (VENTOLIN HFA) 108 (90 Base) MCG/ACT inhaler Inhale 2 puffs into the lungs every 4 (four) hours as needed for wheezing or shortness of breath. 18 g 3   Blood Glucose Monitoring Suppl DEVI 1 each by Does not apply route in the morning, at noon, and at bedtime. May substitute to any manufacturer covered by patient's insurance. 1 each 0   ipratropium-albuterol (DUONEB) 0.5-2.5 (3) MG/3ML SOLN Take 3 mLs by nebulization every 6 (six) hours as needed (shortness of breath, wheezing, or cough). 360 mL 0   metFORMIN (GLUCOPHAGE) 500 MG tablet Take 2 tablets (1,000 mg total) by mouth 2 (two) times daily with a meal. (Patient taking differently: Take 1,000 mg by mouth 2 (two) times daily with a meal. 1 tablet in AM and 1  Tablet in pm) 180 tablet 3   Nicotine 10 MG/ML SOLN Use 1 spray in each nostril every 1-2 hours as needed 10 mL 1   rosuvastatin (CRESTOR) 5 MG tablet Take 1 tablet (5 mg total) by mouth daily. 90 tablet 3   traMADol (ULTRAM) 50 MG tablet Take 1 tablet (50 mg total) by mouth every 6 (six) hours as needed. 10 tablet 0   umeclidinium-vilanterol (ANORO ELLIPTA) 62.5-25 MCG/ACT AEPB Inhale 1 puff into the lungs daily. 90 each 0   No facility-administered medications prior to visit.     Allergies:   Penicillins    Social History   Socioeconomic History   Marital status: Single    Spouse name: Not on file   Number of children: Not on file   Years of education: Not on file   Highest education level: Not on file  Occupational History   Not on file  Tobacco Use   Smoking status: Every Day    Average packs/day: 0.3 packs/day for 46.6 years (11.7 ttl pk-yrs)    Types: Cigarettes    Start date: 54    Passive exposure: Never   Smokeless tobacco: Never  Vaping Use   Vaping status: Never Used  Substance and Sexual Activity   Alcohol use: Not Currently   Drug use: Not Currently   Sexual activity: Not on file  Other Topics Concern   Not on file  Social History Narrative   Not on file   Social Determinants of Health   Financial Resource Strain: Not on file  Food Insecurity: Not on file  Transportation Needs: Not on file  Physical Activity: Not on file  Stress: Not on file  Social Connections: Not on file    Socially he is single.  No children.  He is a Research officer, trade [employer].  He currently has been smoking for 40 years.  He does not drink alcohol.  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  He does not routinely exercise    Family History:  The patient's family history includes Diabetes Mellitus II in his mother.   ROS General: Negative; No fevers, chills, or night sweats;  HEENT: Negative; No changes in vision or hearing, sinus congestion, difficulty swallowing Pulmonary: Negative; No cough, wheezing, shortness of breath, hemoptysis Cardiovascular: Negative; No chest pain, presyncope, syncope, palpitations GI: Negative; No nausea, vomiting, diarrhea, or abdominal pain GU: Negative; No dysuria, hematuria, or difficulty voiding Musculoskeletal: Negative; no myalgias, joint pain, or weakness Hematologic/Oncology: Negative; no easy bruising, bleeding Endocrine: Negative; no heat/cold intolerance; no diabetes Neuro: Negative; no changes in balance, headaches Skin:  Negative; No rashes or skin lesions Psychiatric: Negative; No behavioral problems, depression Sleep: Negative; No snoring, daytime sleepiness, hypersomnolence, bruxism, restless legs, hypnogognic hallucinations, no cataplexy Other comprehensive 14 point system review is negative.   PHYSICAL EXAM:   VS:  BP (!) 120/100   Pulse 96   Ht 6' (1.829 m)   Wt 193 lb (87.5 kg)   SpO2 94%   BMI 26.18 kg/m     Blood pressure by me 122/86.  Wt Readings from Last 3 Encounters:  04/14/23 193 lb (87.5 kg)  03/30/23 187 lb 3.2 oz (84.9 kg)  03/14/23 190 lb (86.2 kg)    General: Alert, oriented, no distress.  Skin: normal turgor, no rashes, warm and dry HEENT: Normocephalic, atraumatic. Pupils equal round and reactive to light; sclera anicteric; extraocular muscles intact;  Nose without nasal septal hypertrophy Mouth/Parynx benign; Mallinpatti scale 3/4  Neck: No JVD, no carotid bruits; normal carotid upstroke Lungs: clear to ausculatation and percussion; no wheezing or rales Chest wall: without tenderness to palpitation Heart: PMI not displaced, RRR, s1 s2 normal, faint 1/6 systolic murmur, no diastolic murmur, no rubs, gallops, thrills, or heaves Abdomen: soft, nontender; no hepatosplenomehaly, BS+; abdominal aorta nontender and not dilated by palpation. Back: no CVA tenderness Pulses 2+ Musculoskeletal: full range of motion, normal strength, no joint deformities Extremities: no clubbing cyanosis or edema, Homan's sign negative  Neurologic: grossly nonfocal; Cranial nerves grossly wnl Psychologic: Normal mood and affect   Studies/Labs Reviewed:   EKG Interpretation Date/Time:  Thursday April 14 2023 15:55:54 EDT Ventricular Rate:  96 PR Interval:  160 QRS Duration:  94 QT Interval:  338 QTC Calculation: 427 R Axis:   72  Text Interpretation: Normal sinus rhythm Normal ECG When compared with ECG of 03-May-2022 09:11, Nonspecific T wave abnormality no longer evident in Lateral  leads Confirmed by Nicki Guadalajara ([id number]) on 04/14/2023 4:52:35 PM    Recent Labs:    Latest Ref Rng & Units 07/22/2022    4:28 PM 05/03/2022    9:21 AM 04/12/2019    1:49 PM  BMP  Glucose 65 - 99 mg/dL 604  540  981   BUN 7 - 25 mg/dL 20  13  21    Creatinine 0.70 - 1.35 mg/dL 1.91  4.78  2.95   BUN/Creat Ratio 6 - 22 (calc) SEE NOTE:     Sodium 135 - 146 mmol/L 143  135  137   Potassium 3.5 - 5.3 mmol/L 5.3  4.3  4.3   Chloride 98 - 110 mmol/L 103  101  98   CO2 20 - 32 mmol/L 18  26  25    Calcium 8.6 - 10.3 mg/dL 9.6  9.0  62.1         Latest Ref Rng & Units 07/22/2022    4:28 PM 04/12/2019    7:12 PM 02/06/2018    9:26 AM  Hepatic Function  Total Protein 6.1 - 8.1 g/dL 7.8  7.7  7.0   Albumin 3.5 - 5.0 g/dL  4.3  3.8   AST 10 - 35 U/L 22  26  22    ALT 9 - 46 U/L 20  21  19    Alk Phosphatase 38 - 126 U/L  58  70   Total Bilirubin 0.2 - 1.2 mg/dL 0.3  1.1  0.7   Bilirubin, Direct 0.0 - 0.2 mg/dL  0.2         Latest Ref Rng & Units 07/22/2022    4:28 PM 05/03/2022    9:21 AM 04/12/2019    1:49 PM  CBC  WBC 3.8 - 10.8 Thousand/uL 3.6  4.7  4.1   Hemoglobin 13.2 - 17.1 g/dL 30.8  65.7  84.6   Hematocrit 38.5 - 50.0 % 48.1  46.5  51.6   Platelets 140 - 400 Thousand/uL 178  168  293    Lab Results  Component Value Date   MCV 91.8 07/22/2022   MCV 93.9 05/03/2022   MCV 95.6 04/12/2019   Lab Results  Component Value Date   TSH 0.51 07/22/2022   Lab Results  Component Value Date   HGBA1C 10.8 (A) 02/09/2023     BNP No results found for: "BNP"  ProBNP    Component Value Date/Time   PROBNP 62.9 05/26/2013 0130     Lipid Panel     Component Value Date/Time   CHOL  143 07/22/2022 1628   TRIG 71 07/22/2022 1628   HDL 74 07/22/2022 1628   CHOLHDL 1.9 07/22/2022 1628   LDLCALC 54 07/22/2022 1628     RADIOLOGY: No results found.   Additional studies/ records that were reviewed today include:  Records of Dr. Fanny Bien were reviewed   ASSESSMENT:     1. Preop cardiovascular exam   2. Questionable history of MI (myocardial infarction)   3. Chronic bronchitis, unspecified chronic bronchitis type (HCC)   4. Right inguinal hernia   5. Tobacco abuse   6. Hyperlipidemia with target LDL less than 70   7. Type 2 diabetes mellitus with hyperglycemia, without long-term current use of insulin [facility])     PLAN:  Sean Martinez is a 62 year old African-American male who has a longstanding history of tobacco use and has smoked for at least 40 years.  Has a history of COPD, type 2 diabetes mellitus, as well as hyperlipidemia.  He reported to his primary physician that he told of having a prior heart attack approximately 10 years ago.  The patient has developed large inguinal hernia that is in need for surgery and was referred to Dr. Karie Soda for surgery.  I was able to obtain some records from 10 years ago.  On May 31, 2022, he apparently had an echo Doppler study which showed an EF of 45 to 50%.  Following to presentations to the emergency room for chest pain in 2014 he ultimately underwent definitive cardiac catheterization which was done by Dr. Jacinto Halim when he was in the [place] area.  I obtained his cath report which showed normal coronary arteries argue against prior myocardial infarction.  However, with the patient's history of longstanding tobacco use, hyperlipidemia, and his need for upcoming surgery I have recommended he undergo a follow-up echo Doppler study for reassessment of LV systolic and diastolic function.  I am also scheduling him for an exercise treadmill nuclear Myoview study to assess for scar and possible ischemia.  His recent lipid studies have been stable with an LDL of 54 on low-dose rosuvastatin at 5 mg.  Apparently he had stopped his metformin but this was recently restarted when he saw Dr. Janee Morn.  We will need to monitor his blood pressure.  We will contact him regarding the results of the above studies and if stable  clearance will be given for his planned elective hernia repair.  I discussed the importance of smoking cessation but I do not believe he has any interesting quitting tobacco.   Medication Adjustments/Labs and Tests Ordered: Current medicines are reviewed at length with the patient today.  Concerns regarding medicines are outlined above.  Medication changes, Labs and Tests ordered today are listed in the Patient Instructions below. Patient Instructions   Medication Instructions:  No medication changes *If you need a refill on your cardiac medications before your next appointment, please call your pharmacy*   Lab Work: No labs If you have labs (blood work) drawn today and your tests are completely normal, you will receive your results only by: MyChart Message (if you have MyChart) OR A paper copy in the mail If you have any lab test that is abnormal or we need to change your treatment, we will call you to review the results.   Testing/Procedures: Your physician has requested that you have an echocardiogram. Echocardiography is a painless test that uses sound waves to create images of your heart. It provides your doctor with information about the size and shape  of your heart and how well your heart's chambers and valves are working. This procedure takes approximately one hour. There are no restrictions for this procedure. Please do NOT wear cologne, perfume, aftershave, or lotions (deodorant is allowed). Please arrive 15 minutes prior to your appointment time.     Your physician has requested that you have en exercise stress myoview.    Follow-Up: At [facility], you and your health needs are our priority.  As part of our continuing mission to provide you with exceptional heart care, we have created designated Provider Care Teams.  These Care Teams include your primary Cardiologist (physician) and Advanced Practice Providers (APPs -  Physician Assistants and Nurse Practitioners)  who all work together to provide you with the care you need, when you need it.  We recommend signing up for the patient portal called "MyChart".  Sign up information is provided on this After Visit Summary.  MyChart is used to connect with patients for Virtual Visits (Telemedicine).  Patients are able to view lab/test results, encounter notes, upcoming appointments, etc.  Non-urgent messages can be sent to your provider as well.   To learn more about what you can do with MyChart, go to [email or web address].    Your next appointment:  pending results     Provider:   Dr. Nicki Guadalajara       Signed, Nicki Guadalajara, MD  04/14/2023 6:27 PM    [facility] [address], [place], [place]  [address] Phone: [phone]

## 2023-04-14 NOTE — Patient Instructions (Addendum)
  Medication Instructions:  No medication changes *If you need a refill on your cardiac medications before your next appointment, please call your pharmacy*   Lab Work: No labs If you have labs (blood work) drawn today and your tests are completely normal, you will receive your results only by: MyChart Message (if you have MyChart) OR A paper copy in the mail If you have any lab test that is abnormal or we need to change your treatment, we will call you to review the results.   Testing/Procedures: Your physician has requested that you have an echocardiogram. Echocardiography is a painless test that uses sound waves to create images of your heart. It provides your doctor with information about the size and shape of your heart and how well your heart's chambers and valves are working. This procedure takes approximately one hour. There are no restrictions for this procedure. Please do NOT wear cologne, perfume, aftershave, or lotions (deodorant is allowed). Please arrive 15 minutes prior to your appointment time.     Your physician has requested that you have en exercise stress myoview.    Follow-Up: At [facility], you and your health needs are our priority.  As part of our continuing mission to provide you with exceptional heart care, we have created designated Provider Care Teams.  These Care Teams include your primary Cardiologist (physician) and Advanced Practice Providers (APPs -  Physician Assistants and Nurse Practitioners) who all work together to provide you with the care you need, when you need it.  We recommend signing up for the patient portal called "MyChart".  Sign up information is provided on this After Visit Summary.  MyChart is used to connect with patients for Virtual Visits (Telemedicine).  Patients are able to view lab/test results, encounter notes, upcoming appointments, etc.  Non-urgent messages can be sent to your provider as well.   To learn more about what  you can do with MyChart, go to [email or web address].    Your next appointment:  pending results     Provider:   Dr. Nicki Guadalajara

## 2023-05-11 ENCOUNTER — Telehealth (HOSPITAL_COMMUNITY): Payer: Self-pay | Admitting: *Deleted

## 2023-05-11 NOTE — Telephone Encounter (Signed)
Left message on voicemail per DPR in reference to upcoming appointment scheduled on 05/16/2023 at 7:15 with detailed instructions given per Myocardial Perfusion Study Information Sheet for the test. LM to arrive 15 minutes early, and that it is imperative to arrive on time for appointment to keep from having the test rescheduled. If you need to cancel or reschedule your appointment, please call the office within 24 hours of your appointment. Failure to do so may result in a cancellation of your appointment, and a $50 no show fee. Phone number given for call back for any questions.

## 2023-05-12 ENCOUNTER — Other Ambulatory Visit: Payer: Self-pay | Admitting: Family Medicine

## 2023-05-12 DIAGNOSIS — J410 Simple chronic bronchitis: Secondary | ICD-10-CM

## 2023-05-16 ENCOUNTER — Encounter (HOSPITAL_COMMUNITY): Payer: Self-pay | Admitting: Cardiovascular Disease

## 2023-05-16 ENCOUNTER — Ambulatory Visit (HOSPITAL_COMMUNITY): Payer: Managed Care, Other (non HMO) | Attending: Family Medicine

## 2023-06-06 ENCOUNTER — Other Ambulatory Visit: Payer: Self-pay | Admitting: Family Medicine

## 2023-06-06 DIAGNOSIS — J42 Unspecified chronic bronchitis: Secondary | ICD-10-CM

## 2023-06-09 ENCOUNTER — Telehealth (HOSPITAL_COMMUNITY): Payer: Self-pay | Admitting: *Deleted

## 2023-06-09 ENCOUNTER — Encounter (HOSPITAL_COMMUNITY): Payer: Self-pay

## 2023-06-09 NOTE — Telephone Encounter (Signed)
Spoke to pt who was at work and stated that he would call tomorrow 06/10/23 To go over stress test instructions.

## 2023-06-13 ENCOUNTER — Ambulatory Visit: Payer: Managed Care, Other (non HMO) | Admitting: Family Medicine

## 2023-06-15 ENCOUNTER — Emergency Department (HOSPITAL_COMMUNITY)
Admission: EM | Admit: 2023-06-15 | Discharge: 2023-06-15 | Disposition: A | Discharge status: Home / Self Care | Payer: Managed Care, Other (non HMO) | Attending: Emergency Medicine | Admitting: Emergency Medicine

## 2023-06-15 DIAGNOSIS — K4090 Unilateral inguinal hernia, without obstruction or gangrene, not specified as recurrent: Secondary | ICD-10-CM | POA: Diagnosis not present

## 2023-06-15 DIAGNOSIS — J45909 Unspecified asthma, uncomplicated: Secondary | ICD-10-CM | POA: Diagnosis not present

## 2023-06-15 DIAGNOSIS — K469 Unspecified abdominal hernia without obstruction or gangrene: Secondary | ICD-10-CM | POA: Diagnosis present

## 2023-06-15 DIAGNOSIS — Z7982 Long term (current) use of aspirin: Secondary | ICD-10-CM | POA: Insufficient documentation

## 2023-06-15 LAB — BASIC METABOLIC PANEL
Anion gap: 8 (ref 5–15)
BUN: 21 mg/dL (ref 8–23)
CO2: 23 mmol/L (ref 22–32)
Calcium: 9.1 mg/dL (ref 8.9–10.3)
Chloride: 108 mmol/L (ref 98–111)
Creatinine, Ser: 0.55 mg/dL — ABNORMAL LOW (ref 0.61–1.24)
GFR, Estimated: >60 mL/min (ref >60)
Glucose, Bld: 136 mg/dL — ABNORMAL HIGH (ref 70–99)
Potassium: 4.4 mmol/L (ref 3.5–5.1)
Sodium: 139 mmol/L (ref 135–145)

## 2023-06-15 LAB — CBC
HCT: 47.6 % (ref 39.0–52.0)
Hemoglobin: 16.1 g/dL (ref 13.0–17.0)
MCH: 32.5 pg (ref 26.0–34.0)
MCHC: 33.8 g/dL (ref 30.0–36.0)
MCV: 96.2 fL (ref 80.0–100.0)
Platelets: 166 10*3/uL (ref 150–400)
RBC: 4.95 MIL/uL (ref 4.22–5.81)
RDW: 13.6 % (ref 11.5–15.5)
WBC: 4.8 10*3/uL (ref 4.0–10.5)
nRBC: 0 % (ref 0.0–0.2)

## 2023-06-15 MED ORDER — ONDANSETRON HCL 4 MG/2ML IJ SOLN
4.0000 mg | Freq: Once | INTRAMUSCULAR | Status: AC
  Administered 2023-06-15: 4 mg via INTRAVENOUS
  Filled 2023-06-15: qty 2

## 2023-06-15 MED ORDER — ACETAMINOPHEN 325 MG PO TABS
650.0000 mg | ORAL_TABLET | Freq: Once | ORAL | Status: AC
  Administered 2023-06-15: 650 mg via ORAL
  Filled 2023-06-15: qty 2

## 2023-06-15 MED ORDER — MORPHINE SULFATE (PF) 4 MG/ML IV SOLN
4.0000 mg | Freq: Once | INTRAVENOUS | Status: AC
  Administered 2023-06-15: 4 mg via INTRAVENOUS
  Filled 2023-06-15: qty 1

## 2023-06-15 NOTE — ED Triage Notes (Addendum)
Arrives POV with right side groin pain- pt has a known hernia, but states it is out more now, and he is unable to reduce it.

## 2023-06-15 NOTE — ED Provider Notes (Signed)
EMERGENCY DEPARTMENT AT [facility] Provider Note   CSN: [id number] Arrival date & time: 06/15/23  1615     History  Chief Complaint  Patient presents with   Hernia    Sean Martinez is a 62 y.o. male.  HPI   Patient has a history of coronary artery disease asthma diabetes myocardial infarction who presents to the ED for evaluation of hernia pain.  Patient states he has had a right inguinal hernia for a while.  He is usually able to reduce it without difficulty.  Patient states he has tried reducing the hernia without success.  Home Medications Prior to Admission medications   Medication Sig Start Date End Date Taking? Authorizing Provider  albuterol (VENTOLIN HFA) 108 (90 Base) MCG/ACT inhaler INHALE 2 PUFFS BY MOUTH EVERY 4 HOURS AS NEEDED FOR WHEEZING FOR SHORTNESS OF BREATH Patient taking differently: Inhale 2 puffs into the lungs every 2 (two) hours as needed for shortness of breath or wheezing. 06/06/23  Yes Garnette Gunner, MD  ANORO ELLIPTA 62.5-25 MCG/ACT AEPB INHALE 1 PUFF BY MOUTH ONCE DAILY AT 6 AM Patient taking differently: Inhale 1 puff into the lungs in the morning. 05/12/23  Yes Garnette Gunner, MD  aspirin EC 325 MG tablet Take 325 mg by mouth 2 (two) times daily as needed (for pain).   Yes [provider]  Ibuprofen 200 MG CAPS Take 1,000 mg by mouth 2 (two) times daily as needed (for pain).   Yes [provider]  metFORMIN (GLUCOPHAGE) 500 MG tablet Take 2 tablets (1,000 mg total) by mouth 2 (two) times daily with a meal. Patient taking differently: Take 500 mg by mouth daily with breakfast. 02/09/23  Yes Garnette Gunner, MD  rosuvastatin (CRESTOR) 5 MG tablet Take 1 tablet (5 mg total) by mouth daily. 02/09/23  Yes Garnette Gunner, MD  Blood Glucose Monitoring Suppl DEVI 1 each by Does not apply route in the morning, at noon, and at bedtime. May substitute to any manufacturer covered by patient's insurance.  07/23/22   Garnette Gunner, MD  ipratropium-albuterol (DUONEB) 0.5-2.5 (3) MG/3ML SOLN Take 3 mLs by nebulization every 6 (six) hours as needed (shortness of breath, wheezing, or cough). Patient not taking: Reported on 06/15/2023 03/14/23 06/15/23  Garnette Gunner, MD  Nicotine 10 MG/ML SOLN Use 1 spray in each nostril every 1-2 hours as needed Patient not taking: Reported on 06/15/2023 03/14/23   Garnette Gunner, MD  traMADol (ULTRAM) 50 MG tablet Take 1 tablet (50 mg total) by mouth every 6 (six) hours as needed. Patient not taking: Reported on 06/15/2023 01/13/23   Merrilee Jansky, MD      Allergies    Penicillins    Review of Systems   Review of Systems  Physical Exam Updated Vital Signs BP (!) 156/100   Pulse 81   Temp 98.1 F (36.7 C)   Resp 17   SpO2 93%  Physical Exam Vitals and nursing note reviewed.  Constitutional:      General: He is not in acute distress.    Appearance: He is well-developed.  HENT:     Head: Normocephalic and atraumatic.     Right Ear: External ear normal.     Left Ear: External ear normal.  Eyes:     General: No scleral icterus.       Right eye: No discharge.        Left eye: No discharge.  Conjunctiva/sclera: Conjunctivae normal.  Neck:     Trachea: No tracheal deviation.  Cardiovascular:     Rate and Rhythm: Normal rate and regular rhythm.  Pulmonary:     Effort: Pulmonary effort is normal. No respiratory distress.     Breath sounds: Normal breath sounds. No stridor. No wheezing or rales.  Abdominal:     General: Bowel sounds are normal. There is no distension.     Palpations: Abdomen is soft.     Tenderness: There is abdominal tenderness. There is no guarding or rebound.     Hernia: A hernia is present. Hernia is present in the right inguinal area.     Comments: Mild ttp lower abdomen, firm, tender right inguinal hernia  Musculoskeletal:        General: No tenderness or deformity.     Cervical back: Neck supple.  Skin:     General: Skin is warm and dry.     Findings: No rash.  Neurological:     General: No focal deficit present.     Mental Status: He is alert.     Cranial Nerves: No cranial nerve deficit, dysarthria or facial asymmetry.     Sensory: No sensory deficit.     Motor: No abnormal muscle tone or seizure activity.     Coordination: Coordination normal.  Psychiatric:        Mood and Affect: Mood normal.     ED Results / Procedures / Treatments   Labs (all labs ordered are listed, but only abnormal results are displayed) Labs Reviewed  BASIC METABOLIC PANEL - Abnormal; Notable for the following components:      Result Value   Glucose, Bld 136 (*)    Creatinine, Ser 0.55 (*)    All other components within normal limits  CBC    EKG None  Radiology No results found.  Procedures Hernia reduction  Date/Time: 06/15/2023 5:35 PM  Performed by: Linwood Dibbles, MD Authorized by: Linwood Dibbles, MD  Consent: Verbal consent obtained. Consent given by: patient Local anesthesia used: no  Anesthesia: Local anesthesia used: no  Sedation: Patient sedated: no  Patient tolerance: patient tolerated the procedure well with no immediate complications Comments: Successful reduction with gentle steady manual compression of the hernia       Medications Ordered in ED Medications  acetaminophen (TYLENOL) tablet 650 mg (has no administration in time range)  morphine (PF) 4 MG/ML injection 4 mg (4 mg Intravenous Given 06/15/23 1701)  ondansetron (ZOFRAN) injection 4 mg (4 mg Intravenous Given 06/15/23 1702)    ED Course/ Medical Decision Making/ A&P Clinical Course as of 06/15/23 1933  Wed Jun 15, 2023  1930 CBC and metabolic panel normal [JK]  1930 Patient still feeling well.  He was able to ambulate without recurrence of his hernia and discomfort [JK]    Clinical Course User Index [JK] Linwood Dibbles, MD                                 Medical Decision Making Problems Addressed: Right  inguinal hernia: acute illness or injury that poses a threat to life or bodily functions  Amount and/or Complexity of Data Reviewed Labs: ordered. Decision-making details documented in ED Course.  Risk OTC drugs. Prescription drug management.   Presented to the ED for evaluation of an inguinal hernia.  Patient has history of this.  He has seen general surgery and is waiting for cardiovascular clearance  before proceeding with any surgical intervention.  Patient was unable to reduce his hernia at home and presented with a recurrent right inguinal hernia.  Patient was given IV pain medications and eventually was able to reduce the hernia without difficulty.  Patient does not have any vomiting.  No fevers.  No signs to suggest strangulation or bowel ischemia.  Will have patient follow-up with his cardiologist for preoperative clearance and general surgeon as planned.        Final Clinical Impression(s) / ED Diagnoses Final diagnoses:  Right inguinal hernia    Rx / DC Orders ED Discharge Orders     None         Linwood Dibbles, MD 06/15/23 [phone]

## 2023-06-15 NOTE — Discharge Instructions (Signed)
Continue to follow-up with your cardiologist for preoperative clearance for your hernia.  Follow-up with your general surgeon as planned.  Return  for recurrent episodes where you are unable to reduce your hernia

## 2023-06-16 ENCOUNTER — Ambulatory Visit (HOSPITAL_BASED_OUTPATIENT_CLINIC_OR_DEPARTMENT_OTHER): Payer: Managed Care, Other (non HMO)

## 2023-06-16 ENCOUNTER — Ambulatory Visit (HOSPITAL_COMMUNITY): Payer: Managed Care, Other (non HMO) | Attending: Cardiovascular Disease

## 2023-06-16 DIAGNOSIS — Z01810 Encounter for preprocedural cardiovascular examination: Secondary | ICD-10-CM | POA: Diagnosis not present

## 2023-06-16 LAB — ECHOCARDIOGRAM COMPLETE
Area-P 1/2: 3.68 cm2
S' Lateral: 3.3 cm

## 2023-06-16 LAB — MYOCARDIAL PERFUSION IMAGING
Base ST Depression (mm): 0 mm
LV dias vol: 89 mL (ref 62–150)
LV sys vol: 35 mL
Nuc Stress EF: 60 %
Peak HR: 106 {beats}/min
Rest HR: 86 {beats}/min
Rest Nuclear Isotope Dose: 10.9 mCi
SDS: 4
SRS: 0
SSS: 4
ST Depression (mm): 0 mm
Stress Nuclear Isotope Dose: 30.7 mCi
TID: 0.99

## 2023-06-16 MED ORDER — TECHNETIUM TC 99M TETROFOSMIN IV KIT
10.9000 | PACK | Freq: Once | INTRAVENOUS | Status: AC | PRN
  Administered 2023-06-16: 10.9 via INTRAVENOUS

## 2023-06-16 MED ORDER — REGADENOSON 0.4 MG/5ML IV SOLN
0.4000 mg | Freq: Once | INTRAVENOUS | Status: AC
  Administered 2023-06-16: 0.4 mg via INTRAVENOUS

## 2023-06-16 MED ORDER — TECHNETIUM TC 99M TETROFOSMIN IV KIT
30.7000 | PACK | Freq: Once | INTRAVENOUS | Status: AC | PRN
Start: 2023-06-16 — End: 2023-06-16
  Administered 2023-06-16: 30.7 via INTRAVENOUS

## 2023-06-17 ENCOUNTER — Other Ambulatory Visit: Payer: Self-pay

## 2023-06-17 ENCOUNTER — Emergency Department (HOSPITAL_COMMUNITY)
Admission: EM | Admit: 2023-06-17 | Discharge: 2023-06-17 | Disposition: A | Discharge status: Home / Self Care | Payer: Managed Care, Other (non HMO) | Attending: Emergency Medicine | Admitting: Emergency Medicine

## 2023-06-17 ENCOUNTER — Emergency Department (HOSPITAL_COMMUNITY): Payer: Managed Care, Other (non HMO)

## 2023-06-17 ENCOUNTER — Encounter (HOSPITAL_COMMUNITY): Payer: Self-pay

## 2023-06-17 ENCOUNTER — Ambulatory Visit: Payer: Self-pay | Admitting: Family Medicine

## 2023-06-17 DIAGNOSIS — R1084 Generalized abdominal pain: Secondary | ICD-10-CM | POA: Diagnosis present

## 2023-06-17 DIAGNOSIS — K4090 Unilateral inguinal hernia, without obstruction or gangrene, not specified as recurrent: Secondary | ICD-10-CM | POA: Insufficient documentation

## 2023-06-17 LAB — CBC WITH DIFFERENTIAL/PLATELET
Abs Immature Granulocytes: 0.01 10*3/uL (ref 0.00–0.07)
Basophils Absolute: 0 10*3/uL (ref 0.0–0.1)
Basophils Relative: 1 %
Eosinophils Absolute: 0 10*3/uL (ref 0.0–0.5)
Eosinophils Relative: 1 %
HCT: 45.3 % (ref 39.0–52.0)
Hemoglobin: 15.8 g/dL (ref 13.0–17.0)
Immature Granulocytes: 0 %
Lymphocytes Relative: 42 %
Lymphs Abs: 1.8 10*3/uL (ref 0.7–4.0)
MCH: 33.2 pg (ref 26.0–34.0)
MCHC: 34.9 g/dL (ref 30.0–36.0)
MCV: 95.2 fL (ref 80.0–100.0)
Monocytes Absolute: 0.4 10*3/uL (ref 0.1–1.0)
Monocytes Relative: 8 %
Neutro Abs: 2.1 10*3/uL (ref 1.7–7.7)
Neutrophils Relative %: 48 %
Platelets: 180 10*3/uL (ref 150–400)
RBC: 4.76 MIL/uL (ref 4.22–5.81)
RDW: 13.3 % (ref 11.5–15.5)
WBC: 4.3 10*3/uL (ref 4.0–10.5)
nRBC: 0 % (ref 0.0–0.2)

## 2023-06-17 LAB — BASIC METABOLIC PANEL
Anion gap: 7 (ref 5–15)
BUN: 20 mg/dL (ref 8–23)
CO2: 28 mmol/L (ref 22–32)
Calcium: 9.1 mg/dL (ref 8.9–10.3)
Chloride: 102 mmol/L (ref 98–111)
Creatinine, Ser: 0.74 mg/dL (ref 0.61–1.24)
GFR, Estimated: >60 mL/min (ref >60)
Glucose, Bld: 168 mg/dL — ABNORMAL HIGH (ref 70–99)
Potassium: 4.2 mmol/L (ref 3.5–5.1)
Sodium: 137 mmol/L (ref 135–145)

## 2023-06-17 MED ORDER — HYDROMORPHONE HCL 1 MG/ML IJ SOLN
1.0000 mg | Freq: Once | INTRAMUSCULAR | Status: AC
  Administered 2023-06-17: 1 mg via INTRAVENOUS
  Filled 2023-06-17: qty 1

## 2023-06-17 MED ORDER — IOHEXOL 300 MG/ML  SOLN
100.0000 mL | Freq: Once | INTRAMUSCULAR | Status: AC | PRN
  Administered 2023-06-17: 100 mL via INTRAVENOUS

## 2023-06-17 NOTE — Telephone Encounter (Signed)
  Chief Complaint: groin pain Symptoms: R groin hernia Frequency: x2 days Pertinent Negatives: Patient denies fever, discoloration of area, vomiting Disposition: [x] ED /[] Urgent Care (no appt availability in office) / [] Appointment(In office/virtual)/ []  [facility]/ [] Home Care/ [] Refused Recommended Disposition /[] [place] [facility]/ []  Follow-up with PCP Additional Notes: Pt calls stating he has a hernia in his R groin that has been present and normally he can reduce it on his own. States on 06/15/23 he was not able to so was seen in ED, provider reduced and it stayed for approx 1 hour. States at this time he has not been able to reduce on his own since ED visit on 12/25. States pain is 10/10. Pt reports he is in the process of obtaining medical clearance for cardiology for surgery. Per protocol, pt to go to ED now. Pt agreeable to plan. Care advice reviewed, pt verbalized understanding. Alerting PCP for review.   Copied from CRM [id number]. Topic: Clinical - Red Word Triage >> Jun 17, 2023 10:47 AM Almira Coaster wrote: Red Word that prompted transfer to Nurse Triage: Hernia on the side of groin, Pain level is at 11. Currently at work. Reason for Disposition  Hernia is painful or tender to touch  Answer Assessment - Initial Assessment Questions 1. ONSET:  "When did this first appear?"     06/15/23 2. APPEARANCE: "What does it look like?"     Large, unable to push in 3. SIZE: "How big is it?" (inches, cm or compare to coins, fruit)     Size of a tennis ball 4. LOCATION: "Where exactly is the hernia located?"     R groin 5. PATTERN: "Does the swelling come and go, or has it been constant since it started?"     Intermittent, usually can put back in on his own but has not been able to. 6. PAIN: "Is there any pain?" If Yes, ask: "How bad is it?"  (Scale 1-10; or mild, moderate, severe)    - MILD (1-3): Doesn't interfere with normal activities, abdomen soft and not tender to touch.      - MODERATE (4-7): Interferes with normal activities or awakens from sleep, abdomen tender to touch.     - SEVERE (8-10): Excruciating pain, doubled over, unable to do any normal activities.       10/10 7. DIAGNOSIS: "Have you been seen by a doctor (or NP/PA) for this?" "Did the doctor diagnose you as having a hernia?"     Yes, was seen in ER 12/25, was sent to surgeon, then cardiology- is in process of being medically cleared for procedure. 8. OTHER SYMPTOMS: "Do you have any other symptoms?" (e.g., fever, abdomen pain, vomiting)     Abdominal pain  Protocols used: Hernia-A-AH

## 2023-06-17 NOTE — ED Provider Notes (Signed)
[place] EMERGENCY DEPARTMENT AT [facility] Provider Note   CSN: [id number] Arrival date & time: 06/17/23  1135     History Chief Complaint  Patient presents with   Hernia    HPI Sean Martinez is a 62 y.o. male presenting for chief complaint of right inguinal hernia.  Seen 2 days ago for similar able to be reduced after IV pain medication.  However he states that by the time he got home and had already come back out.  Because it was Christmas Eve he wanted to wait before returning for now the hernia has been out for approximately 72 hours. No bowel movement in 48 hours.  Swelling and becoming progressively more painful.  Denies fevers chills nausea vomiting syncope or shortness of breath at this time..   Patient's recorded medical, surgical, social, medication list and allergies were reviewed in the Snapshot window as part of the initial history.   Review of Systems   Review of Systems  Constitutional:  Negative for chills and fever.  HENT:  Negative for ear pain and sore throat.   Eyes:  Negative for pain and visual disturbance.  Respiratory:  Negative for cough and shortness of breath.   Cardiovascular:  Negative for chest pain and palpitations.  Gastrointestinal:  Positive for abdominal pain and constipation. Negative for vomiting.  Genitourinary:  Negative for dysuria and hematuria.  Musculoskeletal:  Negative for arthralgias and back pain.  Skin:  Negative for color change and rash.  Neurological:  Negative for seizures and syncope.  All other systems reviewed and are negative.   Physical Exam Updated Vital Signs BP (!) 158/108 (BP Location: Right Arm)   Pulse 96   Temp 97.9 F (36.6 C) (Oral)   Resp 17   Ht 6' (1.829 m)   Wt 84.8 kg   SpO2 94%   BMI 25.36 kg/m  Physical Exam Vitals and nursing note reviewed.  Constitutional:      General: He is not in acute distress.    Appearance: He is well-developed.  HENT:     Head: Normocephalic  and atraumatic.  Eyes:     Conjunctiva/sclera: Conjunctivae normal.  Cardiovascular:     Rate and Rhythm: Normal rate and regular rhythm.     Heart sounds: No murmur heard. Pulmonary:     Effort: Pulmonary effort is normal. No respiratory distress.     Breath sounds: Normal breath sounds.  Abdominal:     Palpations: Abdomen is soft.     Tenderness: There is abdominal tenderness.     Hernia: A hernia is present.  Musculoskeletal:        General: No swelling.     Cervical back: Neck supple.  Skin:    General: Skin is warm and dry.     Capillary Refill: Capillary refill takes less than 2 seconds.  Neurological:     Mental Status: He is alert.  Psychiatric:        Mood and Affect: Mood normal.      ED Course/ Medical Decision Making/ A&P    Procedures Hernia reduction  Date/Time: 06/17/2023 2:57 PM  Performed by: Glyn Ade, MD Authorized by: Glyn Ade, MD  Consent: Verbal consent obtained. Risks and benefits: risks, benefits and alternatives were discussed Consent given by: patient Patient identity confirmed: verbally with patient Patient tolerance: patient tolerated the procedure well with no immediate complications      Medications Ordered in ED Medications  HYDROmorphone (DILAUDID) injection 1 mg (1 mg Intravenous  Given 06/17/23 1225)  iohexol (OMNIPAQUE) 300 MG/ML solution 100 mL (100 mLs Intravenous Contrast Given 06/17/23 1300)   Medical Decision Making:   Sean Martinez is a 62 y.o. male who presented to the ED today with abdominal pain, detailed above.    Complete initial physical exam performed, notably the patient  was HDS in NAD.     Reviewed and confirmed nursing documentation for past medical history, family history, social history.    Initial Assessment:   With the patient's presentation of abdominal pain, most likely diagnosis is nonspecific etiology. Other diagnoses were considered including (but not limited to)  gastroenteritis, colitis, small bowel obstruction, appendicitis, cholecystitis, pancreatitis, nephrolithiasis, UTI, pyleonephritis. These are considered less likely due to history of present illness and physical exam findings.   This is most consistent with an acute life/limb threatening illness complicated by underlying chronic conditions.   Initial Plan:  CBC/CMP to evaluate for underlying infectious/metabolic etiology for patient's abdominal pain  Lipase to evaluate for pancreatitis  EKG to evaluate for cardiac source of pain  CTAB/Pelvis with contrast to evaluate for structural/surgical etiology of patients' severe abdominal pain.  Empiric management of symptoms with escalating pain control and antiemetics as needed.   Initial Study Results:   Laboratory  All laboratory results reviewed without evidence of clinically relevant pathology.    EKG EKG was reviewed independently. Rate, rhythm, axis, intervals all examined and without medically relevant abnormality. ST segments without concerns for elevations.    Radiology All images reviewed independently. Agree with radiology report at this time.   CT ABDOMEN PELVIS W CONTRAST Result Date: 06/17/2023 CLINICAL DATA:  Abdominal pain. EXAM: CT ABDOMEN AND PELVIS WITH CONTRAST TECHNIQUE: Multidetector CT imaging of the abdomen and pelvis was performed using the standard protocol following bolus administration of intravenous contrast. RADIATION DOSE REDUCTION: This exam was performed according to the departmental dose-optimization program which includes automated exposure control, adjustment of the mA and/or kV according to patient size and/or use of iterative reconstruction technique. CONTRAST:  OMNIPAQUE IOHEXOL 300 MG/ML  SOLN COMPARISON:  CT scan abdomen and pelvis from 05/31/2012. FINDINGS: Lower chest: The lung bases are clear. No pleural effusion. The heart is normal in size. No pericardial effusion. Hepatobiliary: The liver is normal in  size. Non-cirrhotic configuration. No suspicious mass. These is mild diffuse hepatic steatosis. No intrahepatic or extrahepatic bile duct dilation. No calcified gallstones. Normal gallbladder wall thickness. No pericholecystic inflammatory changes. Pancreas: Unremarkable. No pancreatic ductal dilatation or surrounding inflammatory changes. Spleen: Within normal limits. No focal lesion. Adrenals/Urinary Tract: Adrenal glands are unremarkable. No suspicious renal mass. No hydronephrosis. No renal or ureteric calculi. Urinary bladder is partially distended precluding optimal assessment however, bladder wall trabeculations and several diverticula noted, suggesting sequela of chronic urinary outflow obstruction. Stomach/Bowel: No disproportionate dilation of the small or large bowel loops. No evidence of abnormal bowel wall thickening or inflammatory changes. The appendix is unremarkable. There are multiple diverticula throughout the colon, without imaging signs of diverticulitis. Vascular/Lymphatic: No ascites or pneumoperitoneum. No abdominal or pelvic lymphadenopathy, by size criteria. No aneurysmal dilation of the major abdominal arteries. There are mild peripheral atherosclerotic vascular calcifications of the aorta and its major branches. Reproductive: Mildly enlarged prostate gland with median lobe projecting into the bladder base. Other: There is a small-to-moderate right inguinal hernia containing portions of cecum, terminal ileum and appendix. No proximal bowel dilation. No abnormal wall thickening, fat stranding and fluid in the hernia sac. There are also a tiny fat containing  umbilical hernia and fat containing left inguinal hernia. The soft tissues and abdominal wall are otherwise unremarkable. Musculoskeletal: No suspicious osseous lesions. There are mild multilevel degenerative changes in the visualized spine. IMPRESSION: *Uncomplicated right inguinal hernia containing portion of cecum, terminal ileum and  appendix. *Multiple other nonacute observations, as described above. Electronically Signed   By: Jules Schick M.D.   On: 06/17/2023 14:20   ECHOCARDIOGRAM COMPLETE Result Date: 06/16/2023    ECHOCARDIOGRAM REPORT   Patient Name:   JERMAIL ROMRELL Date of Exam: 06/16/2023 Medical Rec #:  [id number]           Height:       72.0 in Accession #:    [id number]          Weight:       193.0 lb Date of Birth:  May 27, 1961          BSA:          2.099 m Patient Age:    62 years            BP:           124/80 mmHg Patient Gender: M                   HR:           85 bpm. Exam Location:  [address] Procedure: 2D Echo, 3D Echo, Cardiac Doppler, Color Doppler and Strain Analysis Indications:    Z01.810 Pre-Operative Evaluation  History:        Patient has prior history of Echocardiogram examinations. CAD                 and Previous Myocardial Infarction, COPD; Risk Factors:Current                 Smoker, Diabetes and Dyslipidemia. History of Substance Abuse,                 Pre-Operative Evaluations.  Sonographer:    Chanetta Marshall BA, RDCS Referring Phys: [phone] THOMAS A KELLY IMPRESSIONS  1. Left ventricular ejection fraction, by estimation, is 55 to 60%. The left ventricle has normal function. The left ventricle has no regional wall motion abnormalities. Left ventricular diastolic parameters were normal. The average left ventricular global longitudinal strain is -17.9 %. The global longitudinal strain is normal.  2. Right ventricular systolic function is normal. The right ventricular size is normal. Tricuspid regurgitation signal is inadequate for assessing PA pressure.  3. The mitral valve is normal in structure. No evidence of mitral valve regurgitation. No evidence of mitral stenosis.  4. The aortic valve is tricuspid. Aortic valve regurgitation is not visualized. No aortic stenosis is present.  5. The inferior vena cava is normal in size with greater than 50% respiratory variability, suggesting right atrial  pressure of 3 mmHg. Comparison(s): Changes from prior study are noted. EF normalized compared to study from 2013. Conclusion(s)/Recommendation(s): Normal biventricular function without evidence of hemodynamically significant valvular heart disease. FINDINGS  Left Ventricle: Left ventricular ejection fraction, by estimation, is 55 to 60%. The left ventricle has normal function. The left ventricle has no regional wall motion abnormalities. The average left ventricular global longitudinal strain is -17.9 %. The global longitudinal strain is normal. The left ventricular internal cavity size was normal in size. There is no left ventricular hypertrophy. Left ventricular diastolic parameters were normal. Right Ventricle: The right ventricular size is normal. No increase in right ventricular wall thickness. Right ventricular systolic function is  normal. Tricuspid regurgitation signal is inadequate for assessing PA pressure. Left Atrium: Left atrial size was normal in size. Right Atrium: Right atrial size was normal in size. Pericardium: There is no evidence of pericardial effusion. Mitral Valve: The mitral valve is normal in structure. No evidence of mitral valve regurgitation. No evidence of mitral valve stenosis. Tricuspid Valve: The tricuspid valve is normal in structure. Tricuspid valve regurgitation is not demonstrated. No evidence of tricuspid stenosis. Aortic Valve: The aortic valve is tricuspid. Aortic valve regurgitation is not visualized. No aortic stenosis is present. Pulmonic Valve: The pulmonic valve was not well visualized. Pulmonic valve regurgitation is not visualized. No evidence of pulmonic stenosis. Aorta: The aortic root, ascending aorta, aortic arch and descending aorta are all structurally normal, with no evidence of dilitation or obstruction. Venous: The inferior vena cava is normal in size with greater than 50% respiratory variability, suggesting right atrial pressure of 3 mmHg. IAS/Shunts: The  atrial septum is grossly normal.  LEFT VENTRICLE PLAX 2D LVIDd:         4.60 cm   Diastology LVIDs:         3.30 cm   LV e' medial:    8.27 cm/s LV PW:         0.80 cm   LV E/e' medial:  10.6 LV IVS:        0.80 cm   LV e' lateral:   11.20 cm/s LVOT diam:     2.30 cm   LV E/e' lateral: 7.8 LV SV:         65 LV SV Index:   31        2D Longitudinal Strain LVOT Area:     4.15 cm  2D Strain GLS (A2C):   -18.0 %                          2D Strain GLS (A3C):   -19.6 %                          2D Strain GLS (A4C):   -16.0 %                          2D Strain GLS Avg:     -17.9 %                           3D Volume EF:                          3D EF:        63 %                          LV EDV:       130 ml                          LV ESV:       48 ml                          LV SV:        81 ml RIGHT VENTRICLE             IVC RV Basal diam:  3.13 cm  IVC diam: 2.00 cm RV S prime:     12.80 cm/s TAPSE (M-mode): 2.4 cm LEFT ATRIUM             Index        RIGHT ATRIUM           Index LA diam:        3.30 cm 1.57 cm/m   RA Pressure: 3.00 mmHg LA Vol (A2C):   26.0 ml 12.39 ml/m  RA Area:     12.53 cm LA Vol (A4C):   20.0 ml 9.53 ml/m   RA Volume:   31.65 ml  15.08 ml/m LA Biplane Vol: 23.7 ml 11.29 ml/m  AORTIC VALVE LVOT Vmax:   88.30 cm/s LVOT Vmean:  55.850 cm/s LVOT VTI:    0.158 m  AORTA Ao Root diam: 3.40 cm Ao Asc diam:  3.50 cm MITRAL VALVE               TRICUSPID VALVE MV Area (PHT): cm         Estimated RAP:  3.00 mmHg MV Decel Time: 206 msec MV E velocity: 87.40 cm/s  SHUNTS MV A velocity: 96.90 cm/s  Systemic VTI:  0.16 m MV E/A ratio:  0.90        Systemic Diam: 2.30 cm Jodelle Red MD Electronically signed by Jodelle Red MD Signature Date/Time: 06/16/2023/5:29:37 PM    Final    MYOCARDIAL PERFUSION IMAGING Result Date: 06/16/2023   Lexiscan stress without EKG changes   Myoview scan with normal perfuison and mid thinning in the dital inferior wall consistent with probable soft  tissue attenuation (diaphragm)   LVEF calculated at 60% with normal wall motion   OVerall low risk study   Prior study not available for comparison.   Final Reassessment and Plan:   Once his CT showed no small bowel obstruction, hernia was successfully reduced with pain medication as above. Discussed supportive care patient feels comfortable outpatient care management.  Disposition:  I have considered need for hospitalization, however, considering all of the above, I believe this patient is stable for discharge at this time.  Patient/family educated about specific return precautions for given chief complaint and symptoms.  Patient/family educated about follow-up with PCP.     Patient/family expressed understanding of return precautions and need for follow-up. Patient spoken to regarding all imaging and laboratory results and appropriate follow up for these results. All education provided in verbal form with additional information in written form. Time was allowed for answering of patient questions. Patient discharged.    Emergency Department Medication Summary:   Medications  HYDROmorphone (DILAUDID) injection 1 mg (1 mg Intravenous Given 06/17/23 1225)  iohexol (OMNIPAQUE) 300 MG/ML solution 100 mL (100 mLs Intravenous Contrast Given 06/17/23 1300)          Clinical Impression:  1. Generalized abdominal pain      Discharge   Final Clinical Impression(s) / ED Diagnoses Final diagnoses:  Generalized abdominal pain    Rx / DC Orders ED Discharge Orders     None         Glyn Ade, MD 06/17/23 1459

## 2023-06-17 NOTE — ED Triage Notes (Signed)
Pt arrives via POV. Pt was seen on the 25th due to a right inguinal hernia. Pt reports the provider was able to reduce it, however it has protruded once again. Pt AxOx4

## 2023-06-20 ENCOUNTER — Other Ambulatory Visit: Payer: Self-pay | Admitting: Family Medicine

## 2023-06-20 ENCOUNTER — Telehealth: Payer: Self-pay

## 2023-06-20 ENCOUNTER — Telehealth: Payer: Self-pay | Admitting: Cardiovascular Disease

## 2023-06-20 DIAGNOSIS — J410 Simple chronic bronchitis: Secondary | ICD-10-CM

## 2023-06-20 NOTE — Telephone Encounter (Signed)
Patient is returning call in regards to his Lexiscan results.

## 2023-06-20 NOTE — Transitions of Care (Post Inpatient/ED Visit) (Signed)
   06/20/2023  Name: Torion Shaak MRN: [id number] DOB: [birth year]  Today's TOC FU Call Status: Today's TOC FU Call Status:: Successful TOC FU Call Completed TOC FU Call Complete Date: 06/20/23 Patient's Name and Date of Birth confirmed.  Transition Care Management Follow-up Telephone Call Discharge Facility: [facility]) Type of Discharge: Emergency Department How have you been since you were released from the hospital?: Better Any questions or concerns?: No  Items Reviewed: Did you receive and understand the discharge instructions provided?: Yes Medications obtained,verified, and reconciled?: Yes (Medications Reviewed) Any new allergies since your discharge?: No Dietary orders reviewed?: No Do you have support at home?: Yes  Medications Reviewed Today: Medications Reviewed Today   Medications were not reviewed in this encounter     Home Care and Equipment/Supplies: Were Home Health Services Ordered?: NA Any new equipment or medical supplies ordered?: NA  Functional Questionnaire: Do you need assistance with bathing/showering or dressing?: No Do you need assistance with meal preparation?: No Do you need assistance with eating?: No Do you have difficulty maintaining continence: No Do you need assistance with getting out of bed/getting out of a chair/moving?: No Do you have difficulty managing or taking your medications?: No  Follow up appointments reviewed: PCP Follow-up appointment confirmed?: NA Specialist Hospital Follow-up appointment confirmed?: NA Do you need transportation to your follow-up appointment?: No Do you understand care options if your condition(s) worsen?: Yes-patient verbalized understanding    SIGNATURE Arvil Persons, BSN, RN

## 2023-06-20 NOTE — Telephone Encounter (Signed)
Called and spoke to patient. Verified name and DOB. Below results relayed per Dr Tresa Endo. No questions at this time.    Lennette Bihari, MD 06/19/2023 11:33 AM EST   Relatively normal Lexiscan stress study without ECG changes.  EF 60% with normal wall motion.  Low risk study.

## 2023-06-28 ENCOUNTER — Ambulatory Visit: Payer: Managed Care, Other (non HMO) | Admitting: Family Medicine

## 2023-06-28 ENCOUNTER — Telehealth: Payer: Self-pay

## 2023-06-28 ENCOUNTER — Ambulatory Visit (INDEPENDENT_AMBULATORY_CARE_PROVIDER_SITE_OTHER): Payer: Managed Care, Other (non HMO) | Admitting: Family Medicine

## 2023-06-28 ENCOUNTER — Encounter: Payer: Self-pay | Admitting: Family Medicine

## 2023-06-28 VITALS — BP 138/88 | HR 79 | Temp 97.8°F | Wt 196.0 lb

## 2023-06-28 DIAGNOSIS — M10061 Idiopathic gout, right knee: Secondary | ICD-10-CM | POA: Diagnosis not present

## 2023-06-28 DIAGNOSIS — M25461 Effusion, right knee: Secondary | ICD-10-CM | POA: Diagnosis not present

## 2023-06-28 DIAGNOSIS — M25561 Pain in right knee: Secondary | ICD-10-CM

## 2023-06-28 MED ORDER — COLCHICINE 0.6 MG PO CAPS: ORAL_CAPSULE | ORAL | 0 refills | Status: AC

## 2023-06-28 MED ORDER — KETOROLAC TROMETHAMINE 60 MG/2ML IM SOLN
60.0000 mg | Freq: Once | INTRAMUSCULAR | Status: AC
  Administered 2023-06-28: 60 mg via INTRAMUSCULAR

## 2023-06-28 NOTE — Telephone Encounter (Signed)
 Please advise, see below.  Is there an alternative?

## 2023-06-28 NOTE — Patient Instructions (Signed)
 Take colchicine as prescribed.   For knee xray, go to:    [place] [facility] [address] [place], [place], [place] [address] Phone: [phone]

## 2023-06-28 NOTE — Progress Notes (Signed)
 Assessment/Plan:   Problem List Items Addressed This Visit       Musculoskeletal and Integument   Acute idiopathic gout of right knee   Patient presents with a 3-week history of right knee pain and swelling without any known injury or trauma. The right knee is swollen, warm, and tender to palpation. Pain worsens after prolonged sitting at a desk, causing stiffness that necessitates frequent movement to alleviate discomfort. Denies fever, chills, or rashes.  Differential Diagnosis:  Gout: Likely, given acute onset without trauma, presence of warmth, swelling, and tenderness. Osteoarthritis: Possible but less likely due to the sudden onset. Septic Arthritis: Unlikely as the patient denies systemic symptoms such as fever.  Plan: Administered Toradol  60 mg intramuscular injection in clinic to reduce pain and inflammation. Prescribed colchicine : Take 1.2 mg (two tablets) now. Take 0.6 mg (one tablet) one hour later. Then, take 0.6 mg (one tablet) once daily for up to 7 days. Ordered X-ray of the right knee to evaluate for arthritis, gouty arthropathy, or other pathology. Follow up in one week or sooner if symptoms do not improve. Return promptly if experiencing fever, increased redness, or significant worsening of pain.      Relevant Medications   Colchicine  0.6 MG CAPS     Other   Pain and swelling of knee, right - Primary   Relevant Medications   Colchicine  0.6 MG CAPS   Other Relevant Orders   DG Knee Complete 4 Views Right    There are no discontinued medications.  Return if symptoms worsen or fail to improve.    Subjective:   Encounter date: 06/28/2023  Sean Martinez is a 63 y.o. male who has Tobacco abuse; Chronic cough; Type 2 diabetes mellitus with hyperglycemia, without long-term current use of insulin  (HCC); Chronic obstructive pulmonary disease (HCC); Nonadherence to medical treatment; History of MI (myocardial infarction); Right inguinal hernia; Acute  idiopathic gout of right knee; and Pain and swelling of knee, right on their problem list..   He  has a past medical history of Asthma, Coronary artery disease, MI, old, and Type 2 diabetes mellitus with hyperglycemia, without long-term current use of insulin  (HCC) (08/10/2022)..   Chief Complaint: Right knee pain and swelling.  History of Present Illness:  Patient reports a 3-week history of right knee pain and swelling. Denies any injury or trauma to the knee. Pain is significant, requiring Tylenol  500 mg and ibuprofen  for relief; medications make walking bearable but do not provide significant improvement. Pain and stiffness worsen after sitting at a desk while playing computer games, necessitating frequent movement to alleviate stiffness. Denies fevers, chills, or rashes.  Review of Systems:  General: Denies fever or chills. Musculoskeletal: Positive for right knee pain, swelling, and stiffness after prolonged sitting. Cardiovascular: Denies chest pain or palpitations. Neurological: Denies numbness, tingling, or weakness. Gastrointestinal: Denies abdominal pain or discomfort. All other systems: Negative.  Past Surgical History:  Procedure Laterality Date   HERNIA REPAIR     LEFT HEART CATHETERIZATION WITH CORONARY ANGIOGRAM N/A 07/17/2012   Procedure: LEFT HEART CATHETERIZATION WITH CORONARY ANGIOGRAM;  Surgeon: Erick JONELLE Bergamo, MD;  Location: [facility] CATH LAB;  Service: Cardiovascular;  Laterality: N/A;    Outpatient Medications Prior to Visit  Medication Sig Dispense Refill   albuterol  (VENTOLIN  HFA) 108 (90 Base) MCG/ACT inhaler INHALE 2 PUFFS BY MOUTH EVERY 4 HOURS AS NEEDED FOR WHEEZING FOR SHORTNESS OF BREATH (Patient taking differently: Inhale 2 puffs into the lungs every 2 (two) hours as needed for  shortness of breath or wheezing.) 18 g 0   ANORO ELLIPTA  62.5-25 MCG/ACT AEPB INHALE 1 PUFF BY MOUTH ONCE DAILY AT  6:00 AM 60 each 0   aspirin  EC 325 MG tablet Take 325 mg by mouth 2  (two) times daily as needed (for pain).     Blood Glucose Monitoring Suppl DEVI 1 each by Does not apply route in the morning, at noon, and at bedtime. May substitute to any manufacturer covered by patient's insurance. 1 each 0   metFORMIN  (GLUCOPHAGE ) 500 MG tablet Take 2 tablets (1,000 mg total) by mouth 2 (two) times daily with a meal. (Patient taking differently: Take 500 mg by mouth daily with breakfast.) 180 tablet 3   rosuvastatin  (CRESTOR ) 5 MG tablet Take 1 tablet (5 mg total) by mouth daily. 90 tablet 3   Ibuprofen  200 MG CAPS Take 1,000 mg by mouth 2 (two) times daily as needed (for pain).     ipratropium-albuterol  (DUONEB) 0.5-2.5 (3) MG/3ML SOLN Take 3 mLs by nebulization every 6 (six) hours as needed (shortness of breath, wheezing, or cough). (Patient not taking: Reported on 06/15/2023) 360 mL 0   Nicotine  10 MG/ML SOLN Use 1 spray in each nostril every 1-2 hours as needed (Patient not taking: Reported on 06/15/2023) 10 mL 1   traMADol  (ULTRAM ) 50 MG tablet Take 1 tablet (50 mg total) by mouth every 6 (six) hours as needed. (Patient not taking: Reported on 06/15/2023) 10 tablet 0   No facility-administered medications prior to visit.    Family History  Problem Relation Age of Onset   Diabetes Mellitus II Mother     Social History   Socioeconomic History   Marital status: Single    Spouse name: Not on file   Number of children: Not on file   Years of education: Not on file   Highest education level: Not on file  Occupational History   Not on file  Tobacco Use   Smoking status: Every Day    Average packs/day: 0.3 packs/day for 46.6 years (11.7 ttl pk-yrs)    Types: Cigarettes    Start date: 75    Passive exposure: Never   Smokeless tobacco: Never  Vaping Use   Vaping status: Never Used  Substance and Sexual Activity   Alcohol use: Not Currently   Drug use: Not Currently   Sexual activity: Not on file  Other Topics Concern   Not on file  Social History Narrative    Not on file   Social Drivers of Health   Financial Resource Strain: Not on file  Food Insecurity: Not on file  Transportation Needs: Not on file  Physical Activity: Not on file  Stress: Not on file  Social Connections: Not on file  Intimate Partner Violence: Not on file                                                                                                  Objective:  Physical Exam: BP 138/88   Pulse 79   Temp 97.8 F (36.6 C) (Temporal)   Wt 196 lb (88.9 kg)  SpO2 99%   BMI 26.58 kg/m     Physical Exam Constitutional:      Appearance: Normal appearance.  HENT:     Head: Normocephalic and atraumatic.     Right Ear: Hearing normal.     Left Ear: Hearing normal.     Nose: Nose normal.  Eyes:     General: No scleral icterus.       Right eye: No discharge.        Left eye: No discharge.     Extraocular Movements: Extraocular movements intact.  Cardiovascular:     Rate and Rhythm: Normal rate and regular rhythm.     Heart sounds: Normal heart sounds.  Pulmonary:     Effort: Pulmonary effort is normal.     Breath sounds: Normal breath sounds.  Abdominal:     Palpations: Abdomen is soft.     Tenderness: There is no abdominal tenderness.  Musculoskeletal:     Right knee: Swelling present. Decreased range of motion. Tenderness present.     Right lower leg: 1+ Pitting Edema present.     Left lower leg: 1+ Pitting Edema present.  Skin:    General: Skin is warm.     Findings: No rash.  Neurological:     General: No focal deficit present.     Mental Status: He is alert.     Cranial Nerves: No cranial nerve deficit.  Psychiatric:        Mood and Affect: Mood normal.        Behavior: Behavior normal.        Thought Content: Thought content normal.        Judgment: Judgment normal.     CT ABDOMEN PELVIS W CONTRAST Result Date: 06/17/2023 CLINICAL DATA:  Abdominal pain. EXAM: CT ABDOMEN AND PELVIS WITH CONTRAST TECHNIQUE: Multidetector CT imaging of  the abdomen and pelvis was performed using the standard protocol following bolus administration of intravenous contrast. RADIATION DOSE REDUCTION: This exam was performed according to the departmental dose-optimization program which includes automated exposure control, adjustment of the mA and/or kV according to patient size and/or use of iterative reconstruction technique. CONTRAST:  OMNIPAQUE  IOHEXOL  300 MG/ML  SOLN COMPARISON:  CT scan abdomen and pelvis from 05/31/2012. FINDINGS: Lower chest: The lung bases are clear. No pleural effusion. The heart is normal in size. No pericardial effusion. Hepatobiliary: The liver is normal in size. Non-cirrhotic configuration. No suspicious mass. These is mild diffuse hepatic steatosis. No intrahepatic or extrahepatic bile duct dilation. No calcified gallstones. Normal gallbladder wall thickness. No pericholecystic inflammatory changes. Pancreas: Unremarkable. No pancreatic ductal dilatation or surrounding inflammatory changes. Spleen: Within normal limits. No focal lesion. Adrenals/Urinary Tract: Adrenal glands are unremarkable. No suspicious renal mass. No hydronephrosis. No renal or ureteric calculi. Urinary bladder is partially distended precluding optimal assessment however, bladder wall trabeculations and several diverticula noted, suggesting sequela of chronic urinary outflow obstruction. Stomach/Bowel: No disproportionate dilation of the small or large bowel loops. No evidence of abnormal bowel wall thickening or inflammatory changes. The appendix is unremarkable. There are multiple diverticula throughout the colon, without imaging signs of diverticulitis. Vascular/Lymphatic: No ascites or pneumoperitoneum. No abdominal or pelvic lymphadenopathy, by size criteria. No aneurysmal dilation of the major abdominal arteries. There are mild peripheral atherosclerotic vascular calcifications of the aorta and its major branches. Reproductive: Mildly enlarged prostate  gland with median lobe projecting into the bladder base. Other: There is a small-to-moderate right inguinal hernia containing portions of cecum, terminal ileum and  appendix. No proximal bowel dilation. No abnormal wall thickening, fat stranding and fluid in the hernia sac. There are also a tiny fat containing umbilical hernia and fat containing left inguinal hernia. The soft tissues and abdominal wall are otherwise unremarkable. Musculoskeletal: No suspicious osseous lesions. There are mild multilevel degenerative changes in the visualized spine. IMPRESSION: *Uncomplicated right inguinal hernia containing portion of cecum, terminal ileum and appendix. *Multiple other nonacute observations, as described above. Electronically Signed   By: Ree Molt M.D.   On: 06/17/2023 14:20   ECHOCARDIOGRAM COMPLETE Result Date: 06/16/2023    ECHOCARDIOGRAM REPORT   Patient Name:   JOVAHN BREIT Date of Exam: 06/16/2023 Medical Rec #:  [id number]           Height:       72.0 in Accession #:    [id number]          Weight:       193.0 lb Date of Birth:  [birth year]          BSA:          2.099 m Patient Age:    62 years            BP:           124/80 mmHg Patient Gender: M                   HR:           85 bpm. Exam Location:  [address] Procedure: 2D Echo, 3D Echo, Cardiac Doppler, Color Doppler and Strain Analysis Indications:    Z01.810 Pre-Operative Evaluation  History:        Patient has prior history of Echocardiogram examinations. CAD                 and Previous Myocardial Infarction, COPD; Risk Factors:Current                 Smoker, Diabetes and Dyslipidemia. History of Substance Abuse,                 Pre-Operative Evaluations.  Sonographer:    Nolon Berg BA, RDCS Referring Phys: [id number] THOMAS A KELLY IMPRESSIONS  1. Left ventricular ejection fraction, by estimation, is 55 to 60%. The left ventricle has normal function. The left ventricle has no regional wall motion abnormalities. Left ventricular  diastolic parameters were normal. The average left ventricular global longitudinal strain is -17.9 %. The global longitudinal strain is normal.  2. Right ventricular systolic function is normal. The right ventricular size is normal. Tricuspid regurgitation signal is inadequate for assessing PA pressure.  3. The mitral valve is normal in structure. No evidence of mitral valve regurgitation. No evidence of mitral stenosis.  4. The aortic valve is tricuspid. Aortic valve regurgitation is not visualized. No aortic stenosis is present.  5. The inferior vena cava is normal in size with greater than 50% respiratory variability, suggesting right atrial pressure of 3 mmHg. Comparison(s): Changes from prior study are noted. EF normalized compared to study from 2013. Conclusion(s)/Recommendation(s): Normal biventricular function without evidence of hemodynamically significant valvular heart disease. FINDINGS  Left Ventricle: Left ventricular ejection fraction, by estimation, is 55 to 60%. The left ventricle has normal function. The left ventricle has no regional wall motion abnormalities. The average left ventricular global longitudinal strain is -17.9 %. The global longitudinal strain is normal. The left ventricular internal cavity size was normal in size. There is no left ventricular hypertrophy. Left ventricular diastolic  parameters were normal. Right Ventricle: The right ventricular size is normal. No increase in right ventricular wall thickness. Right ventricular systolic function is normal. Tricuspid regurgitation signal is inadequate for assessing PA pressure. Left Atrium: Left atrial size was normal in size. Right Atrium: Right atrial size was normal in size. Pericardium: There is no evidence of pericardial effusion. Mitral Valve: The mitral valve is normal in structure. No evidence of mitral valve regurgitation. No evidence of mitral valve stenosis. Tricuspid Valve: The tricuspid valve is normal in structure.  Tricuspid valve regurgitation is not demonstrated. No evidence of tricuspid stenosis. Aortic Valve: The aortic valve is tricuspid. Aortic valve regurgitation is not visualized. No aortic stenosis is present. Pulmonic Valve: The pulmonic valve was not well visualized. Pulmonic valve regurgitation is not visualized. No evidence of pulmonic stenosis. Aorta: The aortic root, ascending aorta, aortic arch and descending aorta are all structurally normal, with no evidence of dilitation or obstruction. Venous: The inferior vena cava is normal in size with greater than 50% respiratory variability, suggesting right atrial pressure of 3 mmHg. IAS/Shunts: The atrial septum is grossly normal.  LEFT VENTRICLE PLAX 2D LVIDd:         4.60 cm   Diastology LVIDs:         3.30 cm   LV e' medial:    8.27 cm/s LV PW:         0.80 cm   LV E/e' medial:  10.6 LV IVS:        0.80 cm   LV e' lateral:   11.20 cm/s LVOT diam:     2.30 cm   LV E/e' lateral: 7.8 LV SV:         65 LV SV Index:   31        2D Longitudinal Strain LVOT Area:     4.15 cm  2D Strain GLS (A2C):   -18.0 %                          2D Strain GLS (A3C):   -19.6 %                          2D Strain GLS (A4C):   -16.0 %                          2D Strain GLS Avg:     -17.9 %                           3D Volume EF:                          3D EF:        63 %                          LV EDV:       130 ml                          LV ESV:       48 ml                          LV SV:        81 ml RIGHT  VENTRICLE             IVC RV Basal diam:  3.13 cm     IVC diam: 2.00 cm RV S prime:     12.80 cm/s TAPSE (M-mode): 2.4 cm LEFT ATRIUM             Index        RIGHT ATRIUM           Index LA diam:        3.30 cm 1.57 cm/m   RA Pressure: 3.00 mmHg LA Vol (A2C):   26.0 ml 12.39 ml/m  RA Area:     12.53 cm LA Vol (A4C):   20.0 ml 9.53 ml/m   RA Volume:   31.65 ml  15.08 ml/m LA Biplane Vol: 23.7 ml 11.29 ml/m  AORTIC VALVE LVOT Vmax:   88.30 cm/s LVOT Vmean:  55.850 cm/s LVOT  VTI:    0.158 m  AORTA Ao Root diam: 3.40 cm Ao Asc diam:  3.50 cm MITRAL VALVE               TRICUSPID VALVE MV Area (PHT): cm         Estimated RAP:  3.00 mmHg MV Decel Time: 206 msec MV E velocity: 87.40 cm/s  SHUNTS MV A velocity: 96.90 cm/s  Systemic VTI:  0.16 m MV E/A ratio:  0.90        Systemic Diam: 2.30 cm Shelda Bruckner MD Electronically signed by Shelda Bruckner MD Signature Date/Time: 06/16/2023/5:29:37 PM    Final    MYOCARDIAL PERFUSION IMAGING Result Date: 06/16/2023   Lexiscan  stress without EKG changes   Myoview  scan with normal perfuison and mid thinning in the dital inferior wall consistent with probable soft tissue attenuation (diaphragm)   LVEF calculated at 60% with normal wall motion   OVerall low risk study   Prior study not available for comparison.    Recent Results (from the past 2160 hours)  CBC     Status: None   Collection Time: 06/15/23  6:41 PM  Result Value Ref Range   WBC 4.8 4.0 - 10.5 K/uL   RBC 4.95 4.22 - 5.81 MIL/uL   Hemoglobin 16.1 13.0 - 17.0 g/dL   HCT 52.3 60.9 - 47.9 %   MCV 96.2 80.0 - 100.0 fL   MCH 32.5 26.0 - 34.0 pg   MCHC 33.8 30.0 - 36.0 g/dL   RDW 86.3 88.4 - 84.4 %   Platelets 166 150 - 400 K/uL   nRBC 0.0 0.0 - 0.2 %    Comment: Performed at [facility], [address], [place], [place] [address]  Basic metabolic panel     Status: Abnormal   Collection Time: 06/15/23  6:41 PM  Result Value Ref Range   Sodium 139 135 - 145 mmol/L   Potassium 4.4 3.5 - 5.1 mmol/L   Chloride 108 98 - 111 mmol/L   CO2 23 22 - 32 mmol/L   Glucose, Bld 136 (H) 70 - 99 mg/dL    Comment: Glucose reference range applies only to samples taken after fasting for at least 8 hours.   BUN 21 8 - 23 mg/dL   Creatinine, Ser 9.44 (L) 0.61 - 1.24 mg/dL   Calcium  9.1 8.9 - 10.3 mg/dL   GFR, Estimated >39 >39 mL/min    Comment: (NOTE) Calculated using the CKD-EPI Creatinine Equation (2021)    Anion gap 8 5 - 15    Comment:  Performed  at [facility], [address], [place], [place] [address]  ECHOCARDIOGRAM COMPLETE     Status: None   Collection Time: 06/16/23  2:30 PM  Result Value Ref Range   Area-P 1/2 3.68 cm2   S' Lateral 3.30 cm   Est EF 55 - 60%   MYOCARDIAL PERFUSION IMAGING     Status: None   Collection Time: 06/16/23  3:43 PM  Result Value Ref Range   Rest Nuclear Isotope Dose 10.9 mCi   Stress Nuclear Isotope Dose 30.7 mCi   Rest HR 86.0 bpm   Rest BP 134/94 mmHg   Peak HR 106 bpm   Peak BP 152/78 mmHg   SSS 4.0    SRS 0.0    SDS 4.0    TID 0.99    LV sys vol 35.0 mL   LV dias vol 89.0 62 - 150 mL   Nuc Stress EF 60 %   Base ST Depression (mm) 0 mm   ST Depression (mm) 0 mm  CBC with Differential     Status: None   Collection Time: 06/17/23 12:14 PM  Result Value Ref Range   WBC 4.3 4.0 - 10.5 K/uL   RBC 4.76 4.22 - 5.81 MIL/uL   Hemoglobin 15.8 13.0 - 17.0 g/dL   HCT 54.6 60.9 - 47.9 %   MCV 95.2 80.0 - 100.0 fL   MCH 33.2 26.0 - 34.0 pg   MCHC 34.9 30.0 - 36.0 g/dL   RDW 86.6 88.4 - 84.4 %   Platelets 180 150 - 400 K/uL   nRBC 0.0 0.0 - 0.2 %   Neutrophils Relative % 48 %   Neutro Abs 2.1 1.7 - 7.7 K/uL   Lymphocytes Relative 42 %   Lymphs Abs 1.8 0.7 - 4.0 K/uL   Monocytes Relative 8 %   Monocytes Absolute 0.4 0.1 - 1.0 K/uL   Eosinophils Relative 1 %   Eosinophils Absolute 0.0 0.0 - 0.5 K/uL   Basophils Relative 1 %   Basophils Absolute 0.0 0.0 - 0.1 K/uL   Immature Granulocytes 0 %   Abs Immature Granulocytes 0.01 0.00 - 0.07 K/uL    Comment: Performed at [facility], [address], [place], [place] [address]  Basic metabolic panel     Status: Abnormal   Collection Time: 06/17/23 12:14 PM  Result Value Ref Range   Sodium 137 135 - 145 mmol/L   Potassium 4.2 3.5 - 5.1 mmol/L   Chloride 102 98 - 111 mmol/L   CO2 28 22 - 32 mmol/L   Glucose, Bld 168 (H) 70 - 99 mg/dL    Comment: Glucose reference range applies only to  samples taken after fasting for at least 8 hours.   BUN 20 8 - 23 mg/dL   Creatinine, Ser 9.25 0.61 - 1.24 mg/dL   Calcium  9.1 8.9 - 10.3 mg/dL   GFR, Estimated >39 >39 mL/min    Comment: (NOTE) Calculated using the CKD-EPI Creatinine Equation (2021)    Anion gap 7 5 - 15    Comment: Performed at [facility], [address] [place], [place] [address]        Beverley Adine Hummer, MD, MS

## 2023-06-28 NOTE — Telephone Encounter (Signed)
 Copied from CRM [id number]. Topic: Clinical - Prescription Issue >> Jun 28, 2023  4:35 PM Corin V wrote: Reason for CRM: Patient is calling in regarding the Colchicine  0.6 MG CAPS Rx that was sent in today. It is too expensive for him to afford and he didn't know if there is a generic version that can be sent in, a cost savings card he can be sent, or an alternate medication that can be prescribed instead. Please call patient back with possible solutions.

## 2023-06-29 ENCOUNTER — Telehealth: Payer: Self-pay | Admitting: Cardiovascular Disease

## 2023-06-29 MED ORDER — INDOMETHACIN 50 MG PO CAPS: 50.0000 mg | ORAL_CAPSULE | Freq: Three times a day (TID) | ORAL | 0 refills | Status: DC | PRN

## 2023-06-29 NOTE — Addendum Note (Signed)
 Addended by: Fanny Bien B on: 06/29/2023 11:56 AM   Modules accepted: Orders

## 2023-06-29 NOTE — Telephone Encounter (Signed)
 Russell from [facility] is requesting a callback to check on the status of pt's cardiac clearance they sent over back in August on the 28th. Pt has since had his stress test and Echo done so now they'd like to know when and if pt has been cleared. Please advise

## 2023-06-29 NOTE — Telephone Encounter (Signed)
 Patient aware of annotation and stated indomethacin was $11 compared to colchicine $120.

## 2023-06-30 NOTE — Assessment & Plan Note (Signed)
 Patient presents with a 3-week history of right knee pain and swelling without any known injury or trauma. The right knee is swollen, warm, and tender to palpation. Pain worsens after prolonged sitting at a desk, causing stiffness that necessitates frequent movement to alleviate discomfort. Denies fever, chills, or rashes.  Differential Diagnosis:  Gout: Likely, given acute onset without trauma, presence of warmth, swelling, and tenderness. Osteoarthritis: Possible but less likely due to the sudden onset. Septic Arthritis: Unlikely as the patient denies systemic symptoms such as fever.  Plan: Administered Toradol  60 mg intramuscular injection in clinic to reduce pain and inflammation. Prescribed colchicine : Take 1.2 mg (two tablets) now. Take 0.6 mg (one tablet) one hour later. Then, take 0.6 mg (one tablet) once daily for up to 7 days. Ordered X-ray of the right knee to evaluate for arthritis, gouty arthropathy, or other pathology. Follow up in one week or sooner if symptoms do not improve. Return promptly if experiencing fever, increased redness, or significant worsening of pain.

## 2023-07-03 NOTE — Telephone Encounter (Signed)
 On December 26 patient underwent his echo Doppler study and nuclear perfusion scan.  Results reviewed and essentially normal.  Clearance given for his planned surgery.

## 2023-07-04 NOTE — Telephone Encounter (Signed)
 [facility] and spoke to Corning in triage. Below message relayed per Dr Burnard. No further questions at this time.   Burnard Debby LABOR, MD  Physician Specialty: Cardiology  On December 26 patient underwent his echo Doppler study and nuclear perfusion scan.  Results reviewed and essentially normal.  Clearance given for his planned surgery.

## 2023-07-06 ENCOUNTER — Other Ambulatory Visit: Payer: Self-pay

## 2023-07-06 ENCOUNTER — Telehealth: Payer: Self-pay

## 2023-07-06 ENCOUNTER — Emergency Department (HOSPITAL_COMMUNITY)
Admission: EM | Admit: 2023-07-06 | Discharge: 2023-07-06 | Discharge status: Against Medical Advice | Payer: Managed Care, Other (non HMO) | Source: Home / Self Care

## 2023-07-06 ENCOUNTER — Encounter (HOSPITAL_COMMUNITY): Payer: Self-pay

## 2023-07-06 DIAGNOSIS — K4090 Unilateral inguinal hernia, without obstruction or gangrene, not specified as recurrent: Secondary | ICD-10-CM | POA: Insufficient documentation

## 2023-07-06 DIAGNOSIS — K56609 Unspecified intestinal obstruction, unspecified as to partial versus complete obstruction: Secondary | ICD-10-CM | POA: Diagnosis not present

## 2023-07-06 DIAGNOSIS — K4031 Unilateral inguinal hernia, with obstruction, without gangrene, recurrent: Secondary | ICD-10-CM | POA: Diagnosis not present

## 2023-07-06 DIAGNOSIS — Z5321 Procedure and treatment not carried out due to patient leaving prior to being seen by health care provider: Secondary | ICD-10-CM | POA: Insufficient documentation

## 2023-07-06 LAB — COMPREHENSIVE METABOLIC PANEL
ALT: 22 U/L (ref 0–44)
AST: 17 U/L (ref 15–41)
Albumin: 3.9 g/dL (ref 3.5–5.0)
Alkaline Phosphatase: 89 U/L (ref 38–126)
Anion gap: 8 (ref 5–15)
BUN: 30 mg/dL — ABNORMAL HIGH (ref 8–23)
CO2: 26 mmol/L (ref 22–32)
Calcium: 9.4 mg/dL (ref 8.9–10.3)
Chloride: 101 mmol/L (ref 98–111)
Creatinine, Ser: 0.88 mg/dL (ref 0.61–1.24)
GFR, Estimated: >60 mL/min (ref >60)
Glucose, Bld: 128 mg/dL — ABNORMAL HIGH (ref 70–99)
Potassium: 4.5 mmol/L (ref 3.5–5.1)
Sodium: 135 mmol/L (ref 135–145)
Total Bilirubin: 0.3 mg/dL (ref 0.0–1.2)
Total Protein: 7.9 g/dL (ref 6.5–8.1)

## 2023-07-06 LAB — CBC WITH DIFFERENTIAL/PLATELET
Abs Immature Granulocytes: 0.04 10*3/uL (ref 0.00–0.07)
Basophils Absolute: 0 10*3/uL (ref 0.0–0.1)
Basophils Relative: 0 %
Eosinophils Absolute: 0.1 10*3/uL (ref 0.0–0.5)
Eosinophils Relative: 1 %
HCT: 46.1 % (ref 39.0–52.0)
Hemoglobin: 15.7 g/dL (ref 13.0–17.0)
Immature Granulocytes: 0 %
Lymphocytes Relative: 18 %
Lymphs Abs: 1.8 10*3/uL (ref 0.7–4.0)
MCH: 32.1 pg (ref 26.0–34.0)
MCHC: 34.1 g/dL (ref 30.0–36.0)
MCV: 94.3 fL (ref 80.0–100.0)
Monocytes Absolute: 0.9 10*3/uL (ref 0.1–1.0)
Monocytes Relative: 9 %
Neutro Abs: 7.4 10*3/uL (ref 1.7–7.7)
Neutrophils Relative %: 72 %
Platelets: 188 10*3/uL (ref 150–400)
RBC: 4.89 MIL/uL (ref 4.22–5.81)
RDW: 13.2 % (ref 11.5–15.5)
WBC: 10.2 10*3/uL (ref 4.0–10.5)
nRBC: 0 % (ref 0.0–0.2)

## 2023-07-06 LAB — LIPASE, BLOOD: Lipase: 35 U/L (ref 11–51)

## 2023-07-06 NOTE — ED Triage Notes (Addendum)
 C/o right inguinal hernia pain with protrusion x1 week.  Pt reports suppose to have surgery.

## 2023-07-06 NOTE — Telephone Encounter (Signed)
 Copied from CRM [id number]. Topic: General - Other >> Jul 06, 2023  4:17 PM Howard Macho wrote: Reason for CRM: Pt called stating he would like and update on his hernia surgery because he keeps pushing it back in

## 2023-07-06 NOTE — Telephone Encounter (Signed)
 Reviewed patient's chart and reviewed note from [facility] and read annotation to patient. "Lmom for pt to call back. I was calling to let him know that we received the cardiac clearance and I have turned sx orders into scheduling to work on getting pt set up for a sx date. Electronically signed by Hulda Mage, CMA at 07/04/2023 11:22 AM EST ". Provided patient with phone number to their office [phone]. He verbalized understanding.

## 2023-07-06 NOTE — ED Notes (Signed)
 Pt left AMA

## 2023-07-08 ENCOUNTER — Telehealth: Payer: Self-pay

## 2023-07-08 ENCOUNTER — Encounter (HOSPITAL_COMMUNITY): Payer: Self-pay | Admitting: *Deleted

## 2023-07-08 ENCOUNTER — Other Ambulatory Visit: Payer: Self-pay

## 2023-07-08 ENCOUNTER — Inpatient Hospital Stay (HOSPITAL_COMMUNITY)
Admission: EM | Admit: 2023-07-08 | Discharge: 2023-07-13 | DRG: 351 | Disposition: A | Discharge status: Home / Self Care | Payer: Managed Care, Other (non HMO) | Attending: Internal Medicine | Admitting: Internal Medicine

## 2023-07-08 DIAGNOSIS — K4031 Unilateral inguinal hernia, with obstruction, without gangrene, recurrent: Principal | ICD-10-CM | POA: Diagnosis present

## 2023-07-08 DIAGNOSIS — I252 Old myocardial infarction: Secondary | ICD-10-CM

## 2023-07-08 DIAGNOSIS — F17210 Nicotine dependence, cigarettes, uncomplicated: Secondary | ICD-10-CM | POA: Diagnosis present

## 2023-07-08 DIAGNOSIS — J441 Chronic obstructive pulmonary disease with (acute) exacerbation: Secondary | ICD-10-CM | POA: Diagnosis present

## 2023-07-08 DIAGNOSIS — E785 Hyperlipidemia, unspecified: Secondary | ICD-10-CM | POA: Diagnosis present

## 2023-07-08 DIAGNOSIS — E1159 Type 2 diabetes mellitus with other circulatory complications: Secondary | ICD-10-CM

## 2023-07-08 DIAGNOSIS — D72829 Elevated white blood cell count, unspecified: Secondary | ICD-10-CM | POA: Diagnosis present

## 2023-07-08 DIAGNOSIS — E119 Type 2 diabetes mellitus without complications: Secondary | ICD-10-CM | POA: Diagnosis present

## 2023-07-08 DIAGNOSIS — E861 Hypovolemia: Secondary | ICD-10-CM | POA: Diagnosis present

## 2023-07-08 DIAGNOSIS — Z7951 Long term (current) use of inhaled steroids: Secondary | ICD-10-CM

## 2023-07-08 DIAGNOSIS — E871 Hypo-osmolality and hyponatremia: Secondary | ICD-10-CM | POA: Diagnosis present

## 2023-07-08 DIAGNOSIS — I2510 Atherosclerotic heart disease of native coronary artery without angina pectoris: Secondary | ICD-10-CM | POA: Diagnosis present

## 2023-07-08 DIAGNOSIS — I10 Essential (primary) hypertension: Secondary | ICD-10-CM | POA: Diagnosis present

## 2023-07-08 DIAGNOSIS — K4090 Unilateral inguinal hernia, without obstruction or gangrene, not specified as recurrent: Secondary | ICD-10-CM

## 2023-07-08 DIAGNOSIS — Z79899 Other long term (current) drug therapy: Secondary | ICD-10-CM

## 2023-07-08 DIAGNOSIS — Z7984 Long term (current) use of oral hypoglycemic drugs: Secondary | ICD-10-CM

## 2023-07-08 DIAGNOSIS — E872 Acidosis, unspecified: Secondary | ICD-10-CM | POA: Diagnosis present

## 2023-07-08 DIAGNOSIS — E1129 Type 2 diabetes mellitus with other diabetic kidney complication: Secondary | ICD-10-CM

## 2023-07-08 DIAGNOSIS — Z880 Allergy status to penicillin: Secondary | ICD-10-CM

## 2023-07-08 DIAGNOSIS — J4489 Other specified chronic obstructive pulmonary disease: Secondary | ICD-10-CM | POA: Diagnosis present

## 2023-07-08 DIAGNOSIS — J449 Chronic obstructive pulmonary disease, unspecified: Secondary | ICD-10-CM | POA: Diagnosis present

## 2023-07-08 DIAGNOSIS — Z833 Family history of diabetes mellitus: Secondary | ICD-10-CM

## 2023-07-08 DIAGNOSIS — K56609 Unspecified intestinal obstruction, unspecified as to partial versus complete obstruction: Principal | ICD-10-CM

## 2023-07-08 MED ORDER — HYDROMORPHONE HCL 1 MG/ML IJ SOLN
1.0000 mg | Freq: Once | INTRAMUSCULAR | Status: AC
  Administered 2023-07-08: 1 mg via INTRAVENOUS
  Filled 2023-07-08: qty 1

## 2023-07-08 MED ORDER — ONDANSETRON HCL 4 MG/2ML IJ SOLN
4.0000 mg | Freq: Once | INTRAMUSCULAR | Status: AC
  Administered 2023-07-08: 4 mg via INTRAVENOUS
  Filled 2023-07-08: qty 2

## 2023-07-08 NOTE — ED Provider Notes (Incomplete)
[place] EMERGENCY DEPARTMENT AT [facility] Provider Note   CSN: [id number] Arrival date & time: 07/08/23  1732     History {Add pertinent medical, surgical, social history, OB history to HPI:1} Chief Complaint  Patient presents with   Inguinal Hernia    Sean Martinez is a 63 y.o. male.  HPI     Home Medications Prior to Admission medications   Medication Sig Start Date End Date Taking? Authorizing Provider  albuterol (VENTOLIN HFA) 108 (90 Base) MCG/ACT inhaler INHALE 2 PUFFS BY MOUTH EVERY 4 HOURS AS NEEDED FOR WHEEZING FOR SHORTNESS OF BREATH Patient taking differently: Inhale 2 puffs into the lungs every 2 (two) hours as needed for shortness of breath or wheezing. 06/06/23   Garnette Gunner, MD  ANORO ELLIPTA 62.5-25 MCG/ACT AEPB INHALE 1 PUFF BY MOUTH ONCE DAILY AT  6:00 AM 06/20/23   Worthy Rancher B, FNP  aspirin EC 325 MG tablet Take 325 mg by mouth 2 (two) times daily as needed (for pain).    [provider]  Blood Glucose Monitoring Suppl DEVI 1 each by Does not apply route in the morning, at noon, and at bedtime. May substitute to any manufacturer covered by patient's insurance. 07/23/22   Garnette Gunner, MD  Colchicine 0.6 MG CAPS At onset of flair, Take 1.2 mg (two tablets). Take 0.6 mg (one tablet) one hour later. Then, take 0.6 mg (one tablet) once daily for up to 7 days as needed for swelling and pain. 06/28/23   Garnette Gunner, MD  indomethacin (INDOCIN) 50 MG capsule Take 1 capsule (50 mg total) by mouth 3 (three) times daily as needed for moderate pain (pain score 4-6) (severe pain (8-10)). 06/29/23   Garnette Gunner, MD  ipratropium-albuterol (DUONEB) 0.5-2.5 (3) MG/3ML SOLN Take 3 mLs by nebulization every 6 (six) hours as needed (shortness of breath, wheezing, or cough). Patient not taking: Reported on 06/15/2023 03/14/23 06/15/23  Garnette Gunner, MD  metFORMIN (GLUCOPHAGE) 500 MG tablet Take 2 tablets (1,000 mg total) by mouth  2 (two) times daily with a meal. Patient taking differently: Take 500 mg by mouth daily with breakfast. 02/09/23   Garnette Gunner, MD  Nicotine 10 MG/ML SOLN Use 1 spray in each nostril every 1-2 hours as needed Patient not taking: Reported on 06/15/2023 03/14/23   Garnette Gunner, MD  rosuvastatin (CRESTOR) 5 MG tablet Take 1 tablet (5 mg total) by mouth daily. 02/09/23   Garnette Gunner, MD  traMADol (ULTRAM) 50 MG tablet Take 1 tablet (50 mg total) by mouth every 6 (six) hours as needed. Patient not taking: Reported on 06/15/2023 01/13/23   Merrilee Jansky, MD      Allergies    Penicillins    Review of Systems   Review of Systems  Physical Exam Updated Vital Signs BP (!) 164/104   Pulse (!) 106   Temp 97.7 F (36.5 C)   Resp 20   Wt 89.8 kg   SpO2 97%   BMI 26.85 kg/m  Physical Exam  ED Results / Procedures / Treatments   Labs (all labs ordered are listed, but only abnormal results are displayed) Labs Reviewed  CBC WITH DIFFERENTIAL/PLATELET  BASIC METABOLIC PANEL    EKG None  Radiology No results found.  Procedures Procedures  {Document cardiac monitor, telemetry assessment procedure when appropriate:1}  Medications Ordered in ED Medications  HYDROmorphone (DILAUDID) injection 1 mg (has no administration in time range)  ondansetron (  ZOFRAN) injection 4 mg (has no administration in time range)    ED Course/ Medical Decision Making/ A&P   {   Click here for ABCD2, HEART and other calculatorsREFRESH Note before signing :1}                              Medical Decision Making Amount and/or Complexity of Data Reviewed Labs: ordered. Radiology: ordered.  Risk Prescription drug management.   ***  {Document critical care time when appropriate:1} {Document review of labs and clinical decision tools ie heart score, Chads2Vasc2 etc:1}  {Document your independent review of radiology images, and any outside records:1} {Document your discussion with  family members, caretakers, and with consultants:1} {Document social determinants of health affecting pt's care:1} {Document your decision making why or why not admission, treatments were needed:1} Final Clinical Impression(s) / ED Diagnoses Final diagnoses:  None    Rx / DC Orders ED Discharge Orders     None

## 2023-07-08 NOTE — ED Provider Triage Note (Signed)
Emergency Medicine Provider Triage Evaluation Note  Sean Martinez , a 63 y.o. male  was evaluated in triage.  Pt complains of nonreducible R inguinal hernia starting 06/30/23. 7/10 pain. 3x vomiting this morning. Nauseous. Liquid BM this morning.  Last Tylenol at 1430 today.  Waiting for surgery to be scheduled.  Review of Systems  Positive: nonreducible R inguinal hernia Negative: fever  Physical Exam  BP (!) 153/91   Pulse (!) 103   Temp 98.1 F (36.7 C) (Oral)   Resp 18   Wt 89.8 kg   SpO2 96%   BMI 26.85 kg/m  Gen:   Awake, no distress   Resp:  Normal effort  MSK:   Moves extremities without difficulty  Other:  Nonreducible R inguinal hernia. No overlying skin changes.  Medical Decision Making  Medically screening exam initiated at 7:27 PM.  Appropriate orders placed.  [name] was informed that the remainder of the evaluation will be completed by another provider, this initial triage assessment does not replace that evaluation, and the importance of remaining in the ED until their evaluation is complete.    Judithann Sheen, PA 07/08/23 1930

## 2023-07-08 NOTE — Transitions of Care (Post Inpatient/ED Visit) (Signed)
   07/08/2023  Name: Sean Martinez MRN: [id number] DOB: [birth year]  Today's TOC FU Call Status: Today's TOC FU Call Status:: Successful TOC FU Call Completed TOC FU Call Complete Date: 07/08/23 Patient's Name and Date of Birth confirmed.  Transition Care Management Follow-up Telephone Call Date of Discharge: 07/06/23 Discharge Facility: [facility]) Type of Discharge: Emergency Department (Pt left before being seen) How have you been since you were released from the hospital?: Same Any questions or concerns?: Yes Patient Questions/Concerns:: Pt waiting on cb to schedule his hernia surgery  Items Reviewed:    Medications Reviewed Today: Medications Reviewed Today   Medications were not reviewed in this encounter     Home Care and Equipment/Supplies:    Functional Questionnaire:    Follow up appointments reviewed:      SIGNATURE Arvil Persons, BSN, RN

## 2023-07-08 NOTE — ED Triage Notes (Signed)
BIB friend from home for recurrent R inguinal hernia pain, this episode issues started last Thursday, 8d ago. Waiting for srgery to be scheduled. No longer able to reduce hernia. Mentions NV. V x3 in last 24 hrs. Denies blood/ bleeding, or fever. Has seen Dr. Tresa Endo for the same.

## 2023-07-09 ENCOUNTER — Inpatient Hospital Stay (HOSPITAL_COMMUNITY): Payer: Managed Care, Other (non HMO) | Admitting: Anesthesiology

## 2023-07-09 ENCOUNTER — Encounter (HOSPITAL_COMMUNITY): Payer: Self-pay | Admitting: Internal Medicine

## 2023-07-09 ENCOUNTER — Emergency Department (HOSPITAL_COMMUNITY): Payer: Managed Care, Other (non HMO)

## 2023-07-09 ENCOUNTER — Encounter (HOSPITAL_COMMUNITY)
Admission: EM | Disposition: A | Discharge status: Home / Self Care | Payer: Self-pay | Source: Home / Self Care | Attending: Internal Medicine

## 2023-07-09 ENCOUNTER — Other Ambulatory Visit: Payer: Self-pay

## 2023-07-09 ENCOUNTER — Inpatient Hospital Stay (HOSPITAL_COMMUNITY): Payer: Managed Care, Other (non HMO)

## 2023-07-09 DIAGNOSIS — E861 Hypovolemia: Secondary | ICD-10-CM | POA: Diagnosis present

## 2023-07-09 DIAGNOSIS — E785 Hyperlipidemia, unspecified: Secondary | ICD-10-CM | POA: Insufficient documentation

## 2023-07-09 DIAGNOSIS — K4031 Unilateral inguinal hernia, with obstruction, without gangrene, recurrent: Secondary | ICD-10-CM | POA: Diagnosis present

## 2023-07-09 DIAGNOSIS — F17210 Nicotine dependence, cigarettes, uncomplicated: Secondary | ICD-10-CM | POA: Diagnosis present

## 2023-07-09 DIAGNOSIS — I252 Old myocardial infarction: Secondary | ICD-10-CM | POA: Diagnosis not present

## 2023-07-09 DIAGNOSIS — K56609 Unspecified intestinal obstruction, unspecified as to partial versus complete obstruction: Secondary | ICD-10-CM | POA: Diagnosis present

## 2023-07-09 DIAGNOSIS — E1159 Type 2 diabetes mellitus with other circulatory complications: Secondary | ICD-10-CM

## 2023-07-09 DIAGNOSIS — K4030 Unilateral inguinal hernia, with obstruction, without gangrene, not specified as recurrent: Secondary | ICD-10-CM | POA: Diagnosis not present

## 2023-07-09 DIAGNOSIS — I10 Essential (primary) hypertension: Secondary | ICD-10-CM

## 2023-07-09 DIAGNOSIS — E871 Hypo-osmolality and hyponatremia: Secondary | ICD-10-CM | POA: Diagnosis present

## 2023-07-09 DIAGNOSIS — Z833 Family history of diabetes mellitus: Secondary | ICD-10-CM | POA: Diagnosis not present

## 2023-07-09 DIAGNOSIS — Z79899 Other long term (current) drug therapy: Secondary | ICD-10-CM | POA: Diagnosis not present

## 2023-07-09 DIAGNOSIS — D72829 Elevated white blood cell count, unspecified: Secondary | ICD-10-CM | POA: Diagnosis present

## 2023-07-09 DIAGNOSIS — E119 Type 2 diabetes mellitus without complications: Secondary | ICD-10-CM | POA: Diagnosis present

## 2023-07-09 DIAGNOSIS — Z7984 Long term (current) use of oral hypoglycemic drugs: Secondary | ICD-10-CM | POA: Diagnosis not present

## 2023-07-09 DIAGNOSIS — J4489 Other specified chronic obstructive pulmonary disease: Secondary | ICD-10-CM | POA: Diagnosis present

## 2023-07-09 DIAGNOSIS — Z7951 Long term (current) use of inhaled steroids: Secondary | ICD-10-CM | POA: Diagnosis not present

## 2023-07-09 DIAGNOSIS — E872 Acidosis, unspecified: Secondary | ICD-10-CM | POA: Diagnosis present

## 2023-07-09 DIAGNOSIS — E1129 Type 2 diabetes mellitus with other diabetic kidney complication: Secondary | ICD-10-CM

## 2023-07-09 DIAGNOSIS — I2510 Atherosclerotic heart disease of native coronary artery without angina pectoris: Secondary | ICD-10-CM

## 2023-07-09 DIAGNOSIS — Z880 Allergy status to penicillin: Secondary | ICD-10-CM | POA: Diagnosis not present

## 2023-07-09 HISTORY — PX: VENTRAL HERNIA REPAIR: SHX424

## 2023-07-09 LAB — GLUCOSE, CAPILLARY
Glucose-Capillary: 126 mg/dL — ABNORMAL HIGH (ref 70–99)
Glucose-Capillary: 136 mg/dL — ABNORMAL HIGH (ref 70–99)
Glucose-Capillary: 138 mg/dL — ABNORMAL HIGH (ref 70–99)

## 2023-07-09 LAB — CBC
HCT: 40.9 % (ref 39.0–52.0)
Hemoglobin: 14.2 g/dL (ref 13.0–17.0)
MCH: 32.3 pg (ref 26.0–34.0)
MCHC: 34.7 g/dL (ref 30.0–36.0)
MCV: 93 fL (ref 80.0–100.0)
Platelets: 189 10*3/uL (ref 150–400)
RBC: 4.4 MIL/uL (ref 4.22–5.81)
RDW: 13.2 % (ref 11.5–15.5)
WBC: 10 10*3/uL (ref 4.0–10.5)
nRBC: 0 % (ref 0.0–0.2)

## 2023-07-09 LAB — BASIC METABOLIC PANEL
Anion gap: 10 (ref 5–15)
BUN: 26 mg/dL — ABNORMAL HIGH (ref 8–23)
CO2: 21 mmol/L — ABNORMAL LOW (ref 22–32)
Calcium: 8.9 mg/dL (ref 8.9–10.3)
Chloride: 101 mmol/L (ref 98–111)
Creatinine, Ser: 0.6 mg/dL — ABNORMAL LOW (ref 0.61–1.24)
GFR, Estimated: >60 mL/min (ref >60)
Glucose, Bld: 158 mg/dL — ABNORMAL HIGH (ref 70–99)
Potassium: 4.6 mmol/L (ref 3.5–5.1)
Sodium: 132 mmol/L — ABNORMAL LOW (ref 135–145)

## 2023-07-09 LAB — COMPREHENSIVE METABOLIC PANEL
ALT: 14 U/L (ref 0–44)
AST: 14 U/L — ABNORMAL LOW (ref 15–41)
Albumin: 3.1 g/dL — ABNORMAL LOW (ref 3.5–5.0)
Alkaline Phosphatase: 89 U/L (ref 38–126)
Anion gap: 6 (ref 5–15)
BUN: 24 mg/dL — ABNORMAL HIGH (ref 8–23)
CO2: 25 mmol/L (ref 22–32)
Calcium: 8.7 mg/dL — ABNORMAL LOW (ref 8.9–10.3)
Chloride: 100 mmol/L (ref 98–111)
Creatinine, Ser: 0.71 mg/dL (ref 0.61–1.24)
GFR, Estimated: >60 mL/min (ref >60)
Glucose, Bld: 165 mg/dL — ABNORMAL HIGH (ref 70–99)
Potassium: 4.4 mmol/L (ref 3.5–5.1)
Sodium: 131 mmol/L — ABNORMAL LOW (ref 135–145)
Total Bilirubin: 0.5 mg/dL (ref 0.0–1.2)
Total Protein: 6.8 g/dL (ref 6.5–8.1)

## 2023-07-09 LAB — CBC WITH DIFFERENTIAL/PLATELET
Abs Immature Granulocytes: 0 10*3/uL (ref 0.00–0.07)
Basophils Absolute: 0.2 10*3/uL — ABNORMAL HIGH (ref 0.0–0.1)
Basophils Relative: 2 %
Eosinophils Absolute: 0 10*3/uL (ref 0.0–0.5)
Eosinophils Relative: 0 %
HCT: 42.9 % (ref 39.0–52.0)
Hemoglobin: 15 g/dL (ref 13.0–17.0)
Lymphocytes Relative: 26 %
Lymphs Abs: 3.1 10*3/uL (ref 0.7–4.0)
MCH: 32.8 pg (ref 26.0–34.0)
MCHC: 35 g/dL (ref 30.0–36.0)
MCV: 93.7 fL (ref 80.0–100.0)
Monocytes Absolute: 1.8 10*3/uL — ABNORMAL HIGH (ref 0.1–1.0)
Monocytes Relative: 15 %
Neutro Abs: 6.9 10*3/uL (ref 1.7–7.7)
Neutrophils Relative %: 57 %
Platelets: 180 10*3/uL (ref 150–400)
RBC: 4.58 MIL/uL (ref 4.22–5.81)
RDW: 13.2 % (ref 11.5–15.5)
WBC: 12.1 10*3/uL — ABNORMAL HIGH (ref 4.0–10.5)
nRBC: 0 % (ref 0.0–0.2)

## 2023-07-09 LAB — APTT: aPTT: 35 s (ref 24–36)

## 2023-07-09 LAB — HIV ANTIBODY (ROUTINE TESTING W REFLEX): HIV Screen 4th Generation wRfx: NONREACTIVE

## 2023-07-09 LAB — PROTIME-INR
INR: 1.2 (ref 0.8–1.2)
Prothrombin Time: 14.9 s (ref 11.4–15.2)

## 2023-07-09 SURGERY — REPAIR, HERNIA, VENTRAL
Anesthesia: General

## 2023-07-09 MED ORDER — BUPIVACAINE-EPINEPHRINE (PF) 0.25% -1:200000 IJ SOLN
INTRAMUSCULAR | Status: DC | PRN
  Administered 2023-07-09: 50 mL via PERINEURAL

## 2023-07-09 MED ORDER — LABETALOL HCL 5 MG/ML IV SOLN: 10.0000 mg | INTRAVENOUS | Status: DC | PRN

## 2023-07-09 MED ORDER — CEFAZOLIN SODIUM-DEXTROSE 2-4 GM/100ML-% IV SOLN
2.0000 g | Freq: Once | INTRAVENOUS | Status: AC
  Administered 2023-07-09: 2 g via INTRAVENOUS

## 2023-07-09 MED ORDER — LACTATED RINGERS IV SOLN: INTRAVENOUS | Status: AC

## 2023-07-09 MED ORDER — SUCCINYLCHOLINE CHLORIDE 200 MG/10ML IV SOSY
PREFILLED_SYRINGE | INTRAVENOUS | Status: DC | PRN
  Administered 2023-07-09: 120 mg via INTRAVENOUS

## 2023-07-09 MED ORDER — PROPOFOL 10 MG/ML IV BOLUS
INTRAVENOUS | Status: AC
  Filled 2023-07-09: qty 20

## 2023-07-09 MED ORDER — IPRATROPIUM-ALBUTEROL 0.5-2.5 (3) MG/3ML IN SOLN: 3.0000 mL | Freq: Four times a day (QID) | RESPIRATORY_TRACT | Status: DC | PRN

## 2023-07-09 MED ORDER — OXYCODONE HCL 5 MG/5ML PO SOLN: 5.0000 mg | Freq: Once | ORAL | Status: DC | PRN

## 2023-07-09 MED ORDER — FENTANYL CITRATE (PF) 250 MCG/5ML IJ SOLN
INTRAMUSCULAR | Status: DC | PRN
  Administered 2023-07-09: 100 ug via INTRAVENOUS
  Administered 2023-07-09: 50 ug via INTRAVENOUS
  Administered 2023-07-09 (×2): 100 ug via INTRAVENOUS

## 2023-07-09 MED ORDER — LIDOCAINE 2% (20 MG/ML) 5 ML SYRINGE
INTRAMUSCULAR | Status: DC | PRN
  Administered 2023-07-09: 60 mg via INTRAVENOUS

## 2023-07-09 MED ORDER — LIDOCAINE HCL (PF) 2 % IJ SOLN
INTRAMUSCULAR | Status: AC
  Filled 2023-07-09: qty 5

## 2023-07-09 MED ORDER — 0.9 % SODIUM CHLORIDE (POUR BTL) OPTIME
TOPICAL | Status: DC | PRN
  Administered 2023-07-09: 1000 mL

## 2023-07-09 MED ORDER — ROCURONIUM BROMIDE 10 MG/ML (PF) SYRINGE
PREFILLED_SYRINGE | INTRAVENOUS | Status: DC | PRN
  Administered 2023-07-09: 50 mg via INTRAVENOUS
  Administered 2023-07-09 (×2): 10 mg via INTRAVENOUS
  Administered 2023-07-09: 20 mg via INTRAVENOUS

## 2023-07-09 MED ORDER — ALBUMIN HUMAN 5 % IV SOLN: INTRAVENOUS | Status: DC | PRN

## 2023-07-09 MED ORDER — SODIUM CHLORIDE 0.9% FLUSH
3.0000 mL | Freq: Two times a day (BID) | INTRAVENOUS | Status: DC
  Administered 2023-07-09 – 2023-07-13 (×5): 3 mL via INTRAVENOUS

## 2023-07-09 MED ORDER — HEPARIN SODIUM (PORCINE) 5000 UNIT/ML IJ SOLN
5000.0000 [IU] | Freq: Three times a day (TID) | INTRAMUSCULAR | Status: DC
  Administered 2023-07-09 – 2023-07-13 (×12): 5000 [IU] via SUBCUTANEOUS
  Filled 2023-07-09 (×12): qty 1

## 2023-07-09 MED ORDER — ONDANSETRON HCL 4 MG/2ML IJ SOLN: 4.0000 mg | Freq: Four times a day (QID) | INTRAMUSCULAR | Status: DC | PRN

## 2023-07-09 MED ORDER — IOHEXOL 300 MG/ML  SOLN
100.0000 mL | Freq: Once | INTRAMUSCULAR | Status: AC | PRN
  Administered 2023-07-09: 100 mL via INTRAVENOUS

## 2023-07-09 MED ORDER — CEFAZOLIN SODIUM-DEXTROSE 2-4 GM/100ML-% IV SOLN
INTRAVENOUS | Status: AC
  Filled 2023-07-09: qty 100

## 2023-07-09 MED ORDER — HYDROMORPHONE HCL 1 MG/ML IJ SOLN: 0.2500 mg | INTRAMUSCULAR | Status: DC | PRN

## 2023-07-09 MED ORDER — SUGAMMADEX SODIUM 200 MG/2ML IV SOLN
INTRAVENOUS | Status: DC | PRN
  Administered 2023-07-09: 200 mg via INTRAVENOUS

## 2023-07-09 MED ORDER — BUPIVACAINE-EPINEPHRINE 0.25% -1:200000 IJ SOLN
INTRAMUSCULAR | Status: AC
  Filled 2023-07-09: qty 1

## 2023-07-09 MED ORDER — ONDANSETRON HCL 4 MG/2ML IJ SOLN
INTRAMUSCULAR | Status: DC | PRN
  Administered 2023-07-09: 4 mg via INTRAVENOUS

## 2023-07-09 MED ORDER — MORPHINE SULFATE (PF) 2 MG/ML IV SOLN
1.0000 mg | INTRAVENOUS | Status: DC | PRN
  Administered 2023-07-09 (×3): 1 mg via INTRAVENOUS
  Filled 2023-07-09 (×3): qty 1

## 2023-07-09 MED ORDER — MIDAZOLAM HCL 2 MG/2ML IJ SOLN
INTRAMUSCULAR | Status: AC
  Filled 2023-07-09: qty 2

## 2023-07-09 MED ORDER — UMECLIDINIUM-VILANTEROL 62.5-25 MCG/ACT IN AEPB
1.0000 | INHALATION_SPRAY | Freq: Every day | RESPIRATORY_TRACT | Status: DC
  Administered 2023-07-09 – 2023-07-11 (×3): 1 via RESPIRATORY_TRACT
  Filled 2023-07-09: qty 14

## 2023-07-09 MED ORDER — ROCURONIUM BROMIDE 10 MG/ML (PF) SYRINGE
PREFILLED_SYRINGE | INTRAVENOUS | Status: AC
  Filled 2023-07-09: qty 10

## 2023-07-09 MED ORDER — FENTANYL CITRATE (PF) 100 MCG/2ML IJ SOLN
INTRAMUSCULAR | Status: AC
  Filled 2023-07-09: qty 2

## 2023-07-09 MED ORDER — PROPOFOL 10 MG/ML IV BOLUS
INTRAVENOUS | Status: DC | PRN
  Administered 2023-07-09: 150 mg via INTRAVENOUS

## 2023-07-09 MED ORDER — AMISULPRIDE (ANTIEMETIC) 5 MG/2ML IV SOLN: 10.0000 mg | Freq: Once | INTRAVENOUS | Status: DC | PRN

## 2023-07-09 MED ORDER — ONDANSETRON HCL 4 MG PO TABS: 4.0000 mg | ORAL_TABLET | Freq: Four times a day (QID) | ORAL | Status: DC | PRN

## 2023-07-09 MED ORDER — HYDRALAZINE HCL 20 MG/ML IJ SOLN: 10.0000 mg | Freq: Four times a day (QID) | INTRAMUSCULAR | Status: DC | PRN

## 2023-07-09 MED ORDER — SODIUM CHLORIDE 0.9% FLUSH: 3.0000 mL | INTRAVENOUS | Status: DC | PRN

## 2023-07-09 MED ORDER — MIDAZOLAM HCL 2 MG/2ML IJ SOLN
INTRAMUSCULAR | Status: DC | PRN
  Administered 2023-07-09: 2 mg via INTRAVENOUS

## 2023-07-09 MED ORDER — ALBUMIN HUMAN 5 % IV SOLN
INTRAVENOUS | Status: AC
  Filled 2023-07-09: qty 250

## 2023-07-09 MED ORDER — INSULIN ASPART 100 UNIT/ML IJ SOLN
0.0000 [IU] | INTRAMUSCULAR | Status: DC
  Administered 2023-07-09: 1 [IU] via SUBCUTANEOUS
  Administered 2023-07-10: 2 [IU] via SUBCUTANEOUS
  Administered 2023-07-10 (×2): 3 [IU] via SUBCUTANEOUS
  Administered 2023-07-10 (×2): 1 [IU] via SUBCUTANEOUS
  Administered 2023-07-10 – 2023-07-11 (×3): 3 [IU] via SUBCUTANEOUS
  Administered 2023-07-11: 2 [IU] via SUBCUTANEOUS
  Administered 2023-07-11: 1 [IU] via SUBCUTANEOUS
  Administered 2023-07-11: 2 [IU] via SUBCUTANEOUS
  Administered 2023-07-11 – 2023-07-12 (×3): 1 [IU] via SUBCUTANEOUS
  Administered 2023-07-12: 2 [IU] via SUBCUTANEOUS
  Filled 2023-07-09: qty 0.09

## 2023-07-09 MED ORDER — SODIUM CHLORIDE 0.9 % IV SOLN: 250.0000 mL | INTRAVENOUS | Status: AC | PRN

## 2023-07-09 MED ORDER — SUCCINYLCHOLINE CHLORIDE 200 MG/10ML IV SOSY
PREFILLED_SYRINGE | INTRAVENOUS | Status: AC
Start: 2023-07-09 — End: ?
  Filled 2023-07-09: qty 10

## 2023-07-09 MED ORDER — FENTANYL CITRATE (PF) 250 MCG/5ML IJ SOLN
INTRAMUSCULAR | Status: AC
  Filled 2023-07-09: qty 5

## 2023-07-09 MED ORDER — ONDANSETRON HCL 4 MG/2ML IJ SOLN
INTRAMUSCULAR | Status: AC
  Filled 2023-07-09: qty 2

## 2023-07-09 MED ORDER — MEPERIDINE HCL 50 MG/ML IJ SOLN: 6.2500 mg | INTRAMUSCULAR | Status: DC | PRN

## 2023-07-09 MED ORDER — HYDRALAZINE HCL 20 MG/ML IJ SOLN: 10.0000 mg | INTRAMUSCULAR | Status: DC | PRN

## 2023-07-09 MED ORDER — MORPHINE SULFATE (PF) 2 MG/ML IV SOLN
2.0000 mg | INTRAVENOUS | Status: DC | PRN
  Administered 2023-07-09 – 2023-07-10 (×7): 2 mg via INTRAVENOUS
  Filled 2023-07-09 (×7): qty 1

## 2023-07-09 MED ORDER — KETOROLAC TROMETHAMINE 30 MG/ML IJ SOLN
30.0000 mg | Freq: Once | INTRAMUSCULAR | Status: AC
  Administered 2023-07-09: 30 mg via INTRAVENOUS
  Filled 2023-07-09: qty 1

## 2023-07-09 MED ORDER — NICOTINE 14 MG/24HR TD PT24
14.0000 mg | MEDICATED_PATCH | Freq: Every day | TRANSDERMAL | Status: DC
  Administered 2023-07-09 – 2023-07-13 (×5): 14 mg via TRANSDERMAL
  Filled 2023-07-09 (×5): qty 1

## 2023-07-09 MED ORDER — OXYCODONE HCL 5 MG PO TABS: 5.0000 mg | ORAL_TABLET | Freq: Once | ORAL | Status: DC | PRN

## 2023-07-09 MED ORDER — HYDROMORPHONE HCL 1 MG/ML IJ SOLN
INTRAMUSCULAR | Status: AC
  Filled 2023-07-09: qty 2

## 2023-07-09 MED ORDER — SODIUM CHLORIDE 0.9 % IV SOLN: 12.5000 mg | INTRAVENOUS | Status: DC | PRN

## 2023-07-09 SURGICAL SUPPLY — 21 items
CNTNR URN SCR LID CUP LEK RST (MISCELLANEOUS) IMPLANT
DERMABOND ADVANCED .7 DNX12 (GAUZE/BANDAGES/DRESSINGS) IMPLANT
DRAIN PENROSE 0.5X18 (DRAIN) IMPLANT
ELECT PENCIL ROCKER SW 15FT (MISCELLANEOUS) IMPLANT
GOWN STRL REUS W/TWL XL LVL3 (GOWN DISPOSABLE) IMPLANT
MESH BARD SOFT 3X6IN (Mesh General) IMPLANT
NDL INSUFFLATION 14GA 120MM (NEEDLE) IMPLANT
NEEDLE INSUFFLATION 14GA 120MM (NEEDLE) ×1 IMPLANT
PACK ABDOMINAL GYN (CUSTOM PROCEDURE TRAY) IMPLANT
SET TUBE SMOKE EVAC HIGH FLOW (TUBING) IMPLANT
SLEEVE Z-THREAD 5X100MM (TROCAR) IMPLANT
SPONGE T-LAP 18X18 ~~LOC~~+RFID (SPONGE) IMPLANT
SUT MNCRL AB 4-0 PS2 18 (SUTURE) IMPLANT
SUT PDS AB 2-0 CT2 27 (SUTURE) IMPLANT
SUT PROLENE 0 SH 30 (SUTURE) IMPLANT
SUT PROLENE 2 0 SH DA (SUTURE) IMPLANT
SUT VIC AB 3-0 SH 18 (SUTURE) IMPLANT
SUT VIC AB 3-0 SH 27XBRD (SUTURE) IMPLANT
SYR 30ML LL (SYRINGE) IMPLANT
TROCAR Z-THREAD OPTICAL 5X100M (TROCAR) IMPLANT
YANKAUER SUCT BULB TIP NO VENT (SUCTIONS) IMPLANT

## 2023-07-09 NOTE — Assessment & Plan Note (Signed)
-   due to incarcerated right inguinal hernia -Confirmed on CT abdomen/pelvis on admission as well - General surgery following, appreciate assistance - underwent open right inguinal hernia repair with mesh and diagnostic laparoscopy on 07/09/2023 - NG tube removed per surgery on 07/10/2023 -Has been tolerating diet advancement by surgery.  Advanced to regular diet on 1/21 - Endorses a very small bowel movement overnight.  Still having flatus - continue ambulating - suspect d/c Wednesday if still progressing well

## 2023-07-09 NOTE — Transfer of Care (Signed)
Immediate Anesthesia Transfer of Care Note  Patient: Sean Martinez  Procedure(s) Performed: OPEN RIGHT INGUINAL HERNIA REPAIR; DIAGNOSTIC LAPAROSCOPY  Patient Location: PACU  Anesthesia Type:General  Level of Consciousness: awake, alert , and oriented  Airway & Oxygen Therapy: Patient Spontanous Breathing and Patient connected to face mask oxygen  Post-op Assessment: Report given to RN and Post -op Vital signs reviewed and stable  Post vital signs: Reviewed and stable  Last Vitals:  Vitals Value Taken Time  BP 153/92 07/09/23 1637  Temp    Pulse 118 07/09/23 1640  Resp 25 07/09/23 1640  SpO2 97 % 07/09/23 1640  Vitals shown include unfiled device data.  Last Pain:  Vitals:   07/09/23 1257  TempSrc:   PainSc: 8          Complications: No notable events documented.

## 2023-07-09 NOTE — Assessment & Plan Note (Signed)
-   above goal currently - PRN meds for now

## 2023-07-09 NOTE — Op Note (Signed)
Post-Op Note/Post-Procedure Note  Patient: Sean Martinez MRN: [id number] DOB: 09-24-1960 Sex: male Operation/Procedure Date: 07/08/2023 - 07/09/2023 Surgeons and Role:    * Hillery Hunter Lucilla Edin, MD - Primary  Pre-operative Diagnoses: incarcerated right inguinal hernia Postoperative Diagnoses: incarcerated right inguinal hernia  Procedure: Open right inguinal hernia repair with mesh Diagnostic laparoscopy  Side: Right Anesthesia: General endotracheal anesthesia  Indications: Sean Martinez is a 63 y.o. year old male who presented to the ED with 1 week of swelling in the right groin and scrotum. He underwent a CT that showed a hernia containing the cecum with small bowel obstruction.  I recommended that we proceed to the OR for operative repair.   Preoperatively, I discussed in detail the risks, benefits, alternatives, and potential complications. The patient understands and requests to proceed.  Operative Findings: Large right inguinal hernia with extension into the scrotum. Diagnostic laparoscopy performed and showed injected cecum without evidence of damage/perforation.    Technique:   The patient was positively identified, was taken to the operating room and placed supine on the operating table. A foley catheter was placed. The arms were carefully left out and both were padded. After establishment of general endotracheal anesthesia by the anesthesia team, the lower abdomen, scrotum, and genitalia along with the foley were prepped and draped in the usual sterile surgical fashion. A time-out was performed confirming correct patient and procedure.   I identified the left anterosuperior iliac spine and the left pubic tubercle. I then made a 10- cm oblique incision just above the left pubic tubercle. This was done with #15 blade and the subcutaneous tissues were divided. The expected vein in the subcutaneous space laterally was controlled using the Bovie. I then incised the Scarpa's  fascia and soon encountered the external oblique aponeurosis. There was no femoral hernia. I made a nick in the external oblique with a #15 blade and it was incised with Metzenbaum scissors, obliterating the external ring.  The division was then taken cephalad above the internal ring. the A large indirect sac was mobilized from the subcutaneous tissues and from within the scrotum.  I was able to bluntly dissect near the pubic tubercle until I was able to isolate the cord and the hernia sac from surrounding structures and encircle them with a penrose drain. The sac extended well into the scrotum, but I was able to slowly reduce this pack toward the inguinal canal.  The hernia contents eventually reduced back into the abdomen before I could incise the sac and visualize them.  After the hernia was reduced, I worked to isolate the sac from the cord and important structures. Due to the large sac with chronically incarcerated but viable contents, the sac and cord dissection were made much more difficult than usual greatly increasing the complexity of the operation. This increased the operative time by 60 minutes.    The genital branch of the genitofemoral nerve was resected along with the cremasteric vessels, exposing the floor. I then explored the cord by dividing the cremaster muscles. I identified the testicular vessels and the vas. The indirect sac was dissected free from the cord structures. The large sac was separated from its fusion to the conjoined tendon superiorly and the inguinal floor laterally, completely mobilizing the sac within the preperitoneal space.  The indirect sac was The redundant sac was excised and closed using running 2-0 PDS suture.  In exploring the cord and its contents I discovered a hydrocele, which I left in place.  There  was a large gap at the internal ring with fat protruding. I narrowed this using a running 0 prolene, making sure not to damage any of the underlying structures.  I then  brought Bard mesh 15cm x 10cm and placed this as an onlay in a modified Lichtenstein fashion using running 2-0 Prolene suture inferiorly along the shelving edge of the inguinal ligament.  Superiorly, I place interrupted 2-0 PDS suture fixating the mesh to the conjoint tendon taking care not to injure or entrap nervous structures. I made a new internal ring with 3-0 PDS suture and made sure this was not too tight around the cord structures. The tails were approximated using a single interrupted 3-0 Vicryl suture. The entire mesh and tails were tucked up under the external oblique aponeurosis. I closed the external oblique aponeurosis with running 3-0 Vicryl suture. In doing this, I created a new external ring. The subcutaneous tissues were infiltrated with the 0.25% Marcaine plain. Scarpa's layer was closed using running 3-0 Vicryl suture. In addition, we closed the deep dermal layers using interrupted 3-0 Vicryl suture. A 4-0 Monocryl running subcuticular suture was used to close the skin. Final dressing of Dermabond was placed.  Since I was unable to examine the contents of the hernia prior to reduction back into the abdomen, I felt that it was safest to perform diagnostic laparoscopy to evaluate the bowel. I placed a veress needle in the LUQ at Palmer's point. Following inspiration of air and a positive saline drop test, the abdomen was insufflated to a pressure of .  I placed a 5mm trocar in the LLQ using an optiview technique. There was no sign of injury from entry. I then placed another 5mm port just superior to the umbilicus.  I examined the abdomen and found that the cecum was injected but there was no evidence of necrosis and/or perforation. There was no evidence of contamination throughout the abdomen.  I then concluded the laparoscopy portion of the case.    All sponge, needle, and instrument counts were reported correct at the end of the case.  Estimated Blood Loss:  Minimal.  Specimens: 1. Hernia sac  Implants:  Implant Name Type Inv. Item Serial No. Manufacturer Lot No. LRB No. Used Action  MESH BARD SOFT 3X6IN - [id number] Mesh General MESH BARD SOFT 3X6IN  [employer] BARD ACCESS [id number] Right 1 Implanted   Drains: None. Complications: * No complications entered in OR log *  Condition of the patient: Good, extubated Disposition: PACU Moise Boring Date: 07/09/2023 Time: 4:26 PM

## 2023-07-09 NOTE — Consult Note (Signed)
Reason for Consult: Incarcerated right inguinal hernia Referring Physician: Dr. Ronnald Ramp Sean Martinez is an 63 y.o. male.  HPI: Patient is a 63 year old male who is known to [place].  Patient had previously been seen in clinic in August of last year for a right inguinal hernia. Patient comes in today secondary to pain and discomfort to the right inguinal hernia.  States with this has been going on for several days.  He states he has been having some pain discomfort, nausea vomiting.  He states he has had the hernia that been bulged out over the last several days.  He is unable to reduce it.  Patient underwent CT scan.  This was significant for incarcerated right inguinal hernia, fecalization of the small bowel, and SBO.  General surgery was consulted for further evaluation and management.  He has only had a previous left inguinal hernia in the past.  Past Medical History:  Diagnosis Date   Asthma    Coronary artery disease    MI, old    Type 2 diabetes mellitus with hyperglycemia, without long-term current use of insulin (HCC) 08/10/2022    Past Surgical History:  Procedure Laterality Date   HERNIA REPAIR     LEFT HEART CATHETERIZATION WITH CORONARY ANGIOGRAM N/A 07/17/2012   Procedure: LEFT HEART CATHETERIZATION WITH CORONARY ANGIOGRAM;  Surgeon: Pamella Pert, MD;  Location: [facility] CATH LAB;  Service: Cardiovascular;  Laterality: N/A;    Family History  Problem Relation Age of Onset   Diabetes Mellitus II Mother     Social History:  reports that he has been smoking cigarettes. He started smoking about 47 years ago. He has a 11.7 pack-year smoking history. He has never been exposed to tobacco smoke. He has never used smokeless tobacco. He reports that he does not currently use alcohol. He reports that he does not currently use drugs.  Allergies:  Allergies  Allergen Reactions   Penicillins Hives    Has patient had a PCN reaction causing immediate rash, facial/tongue/throat  swelling, SOB or lightheadedness with hypotension: Yes Has patient had a PCN reaction causing severe rash involving mucus membranes or skin necrosis: No Has patient had a PCN reaction that required hospitalization: No Has patient had a PCN reaction occurring within the last 10 years: No If all of the above answers are "NO", then may proceed with Cephalosporin use.     Medications: I have reviewed the patient's current medications.  Results for orders placed or performed during the hospital encounter of 07/08/23 (from the past 48 hours)  CBC with Differential     Status: Abnormal   Collection Time: 07/08/23 11:54 PM  Result Value Ref Range   WBC 12.1 (H) 4.0 - 10.5 K/uL   RBC 4.58 4.22 - 5.81 MIL/uL   Hemoglobin 15.0 13.0 - 17.0 g/dL   HCT 45.4 09.8 - 11.9 %   MCV 93.7 80.0 - 100.0 fL   MCH 32.8 26.0 - 34.0 pg   MCHC 35.0 30.0 - 36.0 g/dL   RDW 14.7 82.9 - 56.2 %   Platelets 180 150 - 400 K/uL   nRBC 0.0 0.0 - 0.2 %   Neutrophils Relative % 57 %   Neutro Abs 6.9 1.7 - 7.7 K/uL   Lymphocytes Relative 26 %   Lymphs Abs 3.1 0.7 - 4.0 K/uL   Monocytes Relative 15 %   Monocytes Absolute 1.8 (H) 0.1 - 1.0 K/uL   Eosinophils Relative 0 %   Eosinophils Absolute 0.0 0.0 -  0.5 K/uL   Basophils Relative 2 %   Basophils Absolute 0.2 (H) 0.0 - 0.1 K/uL   WBC Morphology TOXIC GRANULATION    Abs Immature Granulocytes 0.00 0.00 - 0.07 K/uL   Reactive, Benign Lymphocytes PRESENT     Comment: Performed at [facility], [address], [place], [place] [address]  Basic metabolic panel     Status: Abnormal   Collection Time: 07/08/23 11:54 PM  Result Value Ref Range   Sodium 132 (L) 135 - 145 mmol/L   Potassium 4.6 3.5 - 5.1 mmol/L   Chloride 101 98 - 111 mmol/L   CO2 21 (L) 22 - 32 mmol/L   Glucose, Bld 158 (H) 70 - 99 mg/dL    Comment: Glucose reference range applies only to samples taken after fasting for at least 8 hours.   BUN 26 (H) 8 - 23 mg/dL   Creatinine, Ser  6.21 (L) 0.61 - 1.24 mg/dL   Calcium 8.9 8.9 - 30.8 mg/dL   GFR, Estimated >65 >78 mL/min    Comment: (NOTE) Calculated using the CKD-EPI Creatinine Equation (2021)    Anion gap 10 5 - 15    Comment: Performed at [facility], [address], [place], [place] [address]  CBC     Status: None   Collection Time: 07/09/23  4:15 AM  Result Value Ref Range   WBC 10.0 4.0 - 10.5 K/uL   RBC 4.40 4.22 - 5.81 MIL/uL   Hemoglobin 14.2 13.0 - 17.0 g/dL   HCT 95.2 84.1 - 32.4 %   MCV 93.0 80.0 - 100.0 fL   MCH 32.3 26.0 - 34.0 pg   MCHC 34.7 30.0 - 36.0 g/dL   RDW 40.1 02.7 - 25.3 %   Platelets 189 150 - 400 K/uL   nRBC 0.0 0.0 - 0.2 %    Comment: Performed at [facility], [address] [place], [place] [address]  Protime-INR     Status: None   Collection Time: 07/09/23  4:15 AM  Result Value Ref Range   Prothrombin Time 14.9 11.4 - 15.2 seconds   INR 1.2 0.8 - 1.2    Comment: (NOTE) INR goal varies based on device and disease states. Performed at [facility], [address] [place], [place] [address]   APTT     Status: None   Collection Time: 07/09/23  4:15 AM  Result Value Ref Range   aPTT 35 24 - 36 seconds    Comment: Performed at [facility], [address] [place], [place] [address]  Comprehensive metabolic panel     Status: Abnormal   Collection Time: 07/09/23  4:15 AM  Result Value Ref Range   Sodium 131 (L) 135 - 145 mmol/L   Potassium 4.4 3.5 - 5.1 mmol/L   Chloride 100 98 - 111 mmol/L   CO2 25 22 - 32 mmol/L   Glucose, Bld 165 (H) 70 - 99 mg/dL    Comment: Glucose reference range applies only to samples taken after fasting for at least 8 hours.   BUN 24 (H) 8 - 23 mg/dL   Creatinine, Ser 8.75 0.61 - 1.24 mg/dL   Calcium 8.7 (L) 8.9 - 10.3 mg/dL   Total Protein 6.8 6.5 - 8.1 g/dL   Albumin 3.1 (L) 3.5 - 5.0 g/dL   AST 14 (L) 15 - 41 U/L   ALT 14 0 - 44 U/L   Alkaline Phosphatase 89 38  -  126 U/L   Total Bilirubin 0.5 0.0 - 1.2 mg/dL   GFR, Estimated >21 >30 mL/min    Comment: (NOTE) Calculated using the CKD-EPI Creatinine Equation (2021)    Anion gap 6 5 - 15    Comment: Performed at [facility], [address] [place], [place] [address]    DG Abd Portable 1 View Result Date: 07/09/2023 CLINICAL DATA:  NG tube placement EXAM: PORTABLE ABDOMEN - 1 VIEW COMPARISON:  CT abdomen pelvis 07/09/2023. FINDINGS: Enteric tube courses below the hemidiaphragm with tip and side port overlying the expected region the gastric lumen. The bowel gas pattern is normal. No radio-opaque calculi or other significant radiographic abnormality are seen. IMPRESSION: Enteric tube in good position. Electronically Signed   By: Tish Frederickson M.D.   On: 07/09/2023 05:26   CT ABDOMEN PELVIS W CONTRAST Result Date: 07/09/2023 CLINICAL DATA:  Bowel obstruction EXAM: CT ABDOMEN AND PELVIS WITH CONTRAST TECHNIQUE: Multidetector CT imaging of the abdomen and pelvis was performed using the standard protocol following bolus administration of intravenous contrast. RADIATION DOSE REDUCTION: This exam was performed according to the departmental dose-optimization program which includes automated exposure control, adjustment of the mA and/or kV according to patient size and/or use of iterative reconstruction technique. CONTRAST:  OMNIPAQUE IOHEXOL 300 MG/ML  SOLN COMPARISON:  None Available. FINDINGS: Lower chest: No acute abnormality. Hepatobiliary: Mild hepatic steatosis. No enhancing intrahepatic mass. No intra or extrahepatic biliary ductal dilation. Gallbladder unremarkable. Pancreas: Unremarkable Spleen: Unremarkable Adrenals/Urinary Tract: Adrenal glands are unremarkable. Kidneys are normal, without renal calculi, focal lesion, or hydronephrosis. Bladder is unremarkable. Stomach/Bowel: Indirect inguinal hernia is present with the cecum, ileocecal junction, and base of the appendix within the  inguinal canal. There is resultant distal small-bowel obstruction with fecalized intraluminal contents within the terminal ileum. The colon is decompressed. No free intraperitoneal gas or fluid. Normal enhancement of the herniated bowel within the inguinal canal. Small right hydrocele is present. Moderate pancolonic diverticulosis without superimposed focal inflammatory change. The stomach, small bowel, large bowel, and appendix are otherwise unremarkable. Vascular/Lymphatic: No significant vascular findings are present. No enlarged abdominal or pelvic lymph nodes. Reproductive: Mild central prostatic hypertrophy. Other: Moderate right inguinal hernia, as above, containing a cecum and ileocecal junction. Musculoskeletal: No acute or significant osseous findings. IMPRESSION: 1. Moderate right inguinal hernia containing the cecum, ileocecal junction, and base of the appendix. Resultant distal small-bowel obstruction. No free intraperitoneal gas or fluid. 2. Mild hepatic steatosis. 3. Moderate pancolonic diverticulosis without superimposed focal inflammatory change. Electronically Signed   By: Helyn Numbers M.D.   On: 07/09/2023 02:18    Review of Systems  Constitutional:  Negative for chills and fever.  HENT:  Negative for ear discharge, hearing loss and sore throat.   Eyes:  Negative for discharge.  Respiratory:  Negative for cough and shortness of breath.   Cardiovascular:  Negative for chest pain and leg swelling.  Gastrointestinal:  Negative for abdominal pain, constipation, diarrhea, nausea and vomiting.  Musculoskeletal:  Negative for myalgias and neck pain.  Skin:  Negative for rash.  Allergic/Immunologic: Negative for environmental allergies.  Neurological:  Negative for dizziness and seizures.  Hematological:  Does not bruise/bleed easily.  Psychiatric/Behavioral:  Negative for suicidal ideas.   All other systems reviewed and are negative.  Blood pressure (!) 151/111, pulse (!) 102,  temperature 97.8 F (36.6 C), temperature source Oral, resp. rate 17, weight 89.8 kg, SpO2 96%. Physical Exam Constitutional:      Appearance: He  is well-developed.     Comments: Conversant No acute distress  HENT:     Head: Normocephalic and atraumatic.  Eyes:     General: Lids are normal. No scleral icterus.    Pupils: Pupils are equal, round, and reactive to light.     Comments: Pupils are equal round and reactive No lid lag Moist conjunctiva  Neck:     Thyroid: No thyromegaly.     Trachea: No tracheal tenderness.     Comments: No cervical lymphadenopathy Cardiovascular:     Rate and Rhythm: Normal rate and regular rhythm.     Heart sounds: No murmur heard. Pulmonary:     Effort: Pulmonary effort is normal.     Breath sounds: Normal breath sounds. No wheezing or rales.  Abdominal:     Tenderness: There is no abdominal tenderness.     Hernia: A hernia is present. Hernia is present in the right inguinal area.  Musculoskeletal:     Cervical back: Normal range of motion and neck supple.  Skin:    General: Skin is warm.     Findings: No rash.     Nails: There is no clubbing.     Comments: Normal skin turgor  Neurological:     Mental Status: He is alert and oriented to person, place, and time.     Comments: Normal gait and station  Psychiatric:        Mood and Affect: Mood normal.        Thought Content: Thought content normal.        Judgment: Judgment normal.     Comments: Appropriate affect     Assessment/Plan: 63 year old male with a right incarcerated inguinal hernia. History of gout. 1.  Patient will likely require open right inguinal hernia repair +/- mesh depending on viability of the bowel. 2.  Discussed with him the risk benefits of the procedure.  Patient agrees to continue with surgery.    Axel Filler 07/09/2023, 6:57 AM

## 2023-07-09 NOTE — ED Notes (Signed)
Patient transported to CT 

## 2023-07-09 NOTE — Anesthesia Preprocedure Evaluation (Addendum)
Anesthesia Evaluation  Patient identified by MRN, date of birth, ID band Patient awake    Reviewed: Allergy & Precautions, H&P , NPO status , Patient's Chart, lab work & pertinent test results  Airway Mallampati: II  TM Distance: >3 FB Neck ROM: Full    Dental  (+) Edentulous Upper, Edentulous Lower   Pulmonary asthma , COPD, Current Smoker and Patient abstained from smoking.   Pulmonary exam normal breath sounds clear to auscultation       Cardiovascular hypertension, + CAD and + Past MI  Normal cardiovascular exam Rhythm:Regular Rate:Normal     Neuro/Psych negative neurological ROS  negative psych ROS   GI/Hepatic negative GI ROS, Neg liver ROS,,,  Endo/Other  diabetes, Type 2    Renal/GU negative Renal ROS  negative genitourinary   Musculoskeletal  (+) Arthritis , Osteoarthritis,    Abdominal   Peds negative pediatric ROS (+)  Hematology negative hematology ROS (+)   Anesthesia Other Findings   Reproductive/Obstetrics negative OB ROS                             Anesthesia Physical Anesthesia Plan  ASA: 3 and emergent  Anesthesia Plan: General   Post-op Pain Management: Dilaudid IV   Induction: Intravenous, Rapid sequence and Cricoid pressure planned  PONV Risk Score and Plan: 1 and Ondansetron and Treatment may vary due to age or medical condition  Airway Management Planned: Oral ETT  Additional Equipment:   Intra-op Plan:   Post-operative Plan: Extubation in OR  Informed Consent: I have reviewed the patients History and Physical, chart, labs and discussed the procedure including the risks, benefits and alternatives for the proposed anesthesia with the patient or authorized representative who has indicated his/her understanding and acceptance.     Dental advisory given  Plan Discussed with: CRNA  Anesthesia Plan Comments:        Anesthesia Quick Evaluation

## 2023-07-09 NOTE — ED Notes (Signed)
Provider at bedside

## 2023-07-09 NOTE — Assessment & Plan Note (Signed)
-   SSI and CBG - q4h while on FLD still; can change once diet further advanced to solids

## 2023-07-09 NOTE — Anesthesia Procedure Notes (Signed)
Procedure Name: Intubation Date/Time: 07/09/2023 1:44 PM  Performed by: Elyn Peers, CRNAPre-anesthesia Checklist: Patient identified, Emergency Drugs available, Suction available, Patient being monitored and Timeout performed Patient Re-evaluated:Patient Re-evaluated prior to induction Oxygen Delivery Method: Circle system utilized Preoxygenation: Pre-oxygenation with 100% oxygen Induction Type: IV induction, Rapid sequence and Cricoid Pressure applied Laryngoscope Size: Miller and 3 Grade View: Grade I Tube type: Oral Tube size: 8.0 mm Number of attempts: 1 Airway Equipment and Method: Stylet Placement Confirmation: ETT inserted through vocal cords under direct vision, positive ETCO2 and breath sounds checked- equal and bilateral Secured at: 23 cm Tube secured with: Tape Dental Injury: Teeth and Oropharynx as per pre-operative assessment

## 2023-07-09 NOTE — Assessment & Plan Note (Signed)
-   mild, asymptomatic - s/p IVF

## 2023-07-09 NOTE — Progress Notes (Signed)
   07/09/23 1739  MEWS Score  Resp 16  Pulse Rate (!) 112  BP (!) 138/92  Temp 98.4 F (36.9 C)  MEWS RR 0  MEWS Pulse 2  MEWS Systolic 0  MEWS LOC 0  MEWS Temp 0  MEWS Score 2  MEWS Score Color Yellow  MEWS ECG HR/ Pulse 112

## 2023-07-09 NOTE — Assessment & Plan Note (Signed)
-   see SBO

## 2023-07-09 NOTE — Assessment & Plan Note (Signed)
-   no s/s exac

## 2023-07-09 NOTE — Progress Notes (Signed)
Progress Note    Sean Martinez   ZOX:[id number]  DOB: 25-Sep-1960  DOA: 07/08/2023     0 PCP: Garnette Gunner, MD  Initial CC: R inguinal hernia, abd pain  Hospital Course: Sean Martinez is a 63 yo male with PMH left inguinal hernia (repaired in teenage years), CAD, DMII, asthma who presented with ongoing right inguinal hernia.  Typically he is able to reduce it but states it became unreducible and exquisitely painful.  He also developed nausea and vomiting. CT abdomen/pelvis showed incarcerated right inguinal hernia with distal small bowel obstruction.  He underwent NG tube placement and was admitted for surgical evaluation.  Interval History:  Seen in the ER this morning.  Still with ongoing pain from incarcerated right inguinal hernia.  He has been evaluated by surgery with recommendations for repair.  NG tube in place and nausea/vomiting has improved after placement.  Assessment and Plan: * SBO (small bowel obstruction) (HCC) - due to incarcerated right inguinal hernia -Confirmed on CT abdomen/pelvis on admission as well - Continue n.p.o. and NG tube - General surgery following, appreciate assistance.  Tentative plan for repair during hospitalization - Continue nausea and pain control  Inguinal hernia - see SBO  Hyponatremia - mild, asymptomatic  - continue IVF  Essential hypertension - above goal currently - PRN meds for now  Non-insulin dependent type 2 diabetes mellitus (HCC) - SSI and CBG - q4h while NPO  Hyperlipidemia - Crestor on hold for now  Continuous dependence on cigarette smoking - nicotine patch as needed  COPD (chronic obstructive pulmonary disease) (HCC) - no s/s exac   Old records reviewed in assessment of this patient  Antimicrobials:   DVT prophylaxis:  heparin injection 5,000 Units Start: 07/09/23 0600 SCDs Start: 07/09/23 0315 Place TED hose Start: 07/09/23 0315   Code Status:   Code Status: Full Code  Mobility Assessment  (Last 72 Hours)     Mobility Assessment   No documentation.           Barriers to discharge: none Disposition Plan:  Home HH orders placed: n/a Status is: Inpt  Objective: Blood pressure (!) 145/102, pulse (!) 111, temperature 99.2 F (37.3 C), temperature source Oral, resp. rate 18, weight 89.8 kg, SpO2 93%.  Examination:  Physical Exam Constitutional:      General: He is not in acute distress.    Appearance: Normal appearance.  HENT:     Head: Normocephalic and atraumatic.     Mouth/Throat:     Mouth: Mucous membranes are moist.  Eyes:     Extraocular Movements: Extraocular movements intact.  Cardiovascular:     Rate and Rhythm: Normal rate and regular rhythm.  Pulmonary:     Effort: Pulmonary effort is normal. No respiratory distress.     Breath sounds: Normal breath sounds. No wheezing.  Abdominal:     General: Bowel sounds are normal. There is no distension.     Palpations: Abdomen is soft.     Tenderness: There is no abdominal tenderness.     Hernia: A hernia is present. Hernia is present in the right inguinal area (nonreducible and TTP).  Musculoskeletal:        General: Normal range of motion.     Cervical back: Normal range of motion and neck supple.  Skin:    General: Skin is warm and dry.  Neurological:     General: No focal deficit present.     Mental Status: He is alert.  Psychiatric:  Mood and Affect: Mood normal.        Behavior: Behavior normal.      Consultants:  General surgery  Procedures:    Data Reviewed: Results for orders placed or performed during the hospital encounter of 07/08/23 (from the past 24 hours)  CBC with Differential     Status: Abnormal   Collection Time: 07/08/23 11:54 PM  Result Value Ref Range   WBC 12.1 (H) 4.0 - 10.5 K/uL   RBC 4.58 4.22 - 5.81 MIL/uL   Hemoglobin 15.0 13.0 - 17.0 g/dL   HCT 16.1 09.6 - 04.5 %   MCV 93.7 80.0 - 100.0 fL   MCH 32.8 26.0 - 34.0 pg   MCHC 35.0 30.0 - 36.0 g/dL   RDW  40.9 81.1 - 91.4 %   Platelets 180 150 - 400 K/uL   nRBC 0.0 0.0 - 0.2 %   Neutrophils Relative % 57 %   Neutro Abs 6.9 1.7 - 7.7 K/uL   Lymphocytes Relative 26 %   Lymphs Abs 3.1 0.7 - 4.0 K/uL   Monocytes Relative 15 %   Monocytes Absolute 1.8 (H) 0.1 - 1.0 K/uL   Eosinophils Relative 0 %   Eosinophils Absolute 0.0 0.0 - 0.5 K/uL   Basophils Relative 2 %   Basophils Absolute 0.2 (H) 0.0 - 0.1 K/uL   WBC Morphology TOXIC GRANULATION    Abs Immature Granulocytes 0.00 0.00 - 0.07 K/uL   Reactive, Benign Lymphocytes PRESENT   Basic metabolic panel     Status: Abnormal   Collection Time: 07/08/23 11:54 PM  Result Value Ref Range   Sodium 132 (L) 135 - 145 mmol/L   Potassium 4.6 3.5 - 5.1 mmol/L   Chloride 101 98 - 111 mmol/L   CO2 21 (L) 22 - 32 mmol/L   Glucose, Bld 158 (H) 70 - 99 mg/dL   BUN 26 (H) 8 - 23 mg/dL   Creatinine, Ser 7.82 (L) 0.61 - 1.24 mg/dL   Calcium 8.9 8.9 - 95.6 mg/dL   GFR, Estimated >21 >30 mL/min   Anion gap 10 5 - 15  HIV Antibody (routine testing w rflx)     Status: None   Collection Time: 07/09/23  4:15 AM  Result Value Ref Range   HIV Screen 4th Generation wRfx Non Reactive Non Reactive  CBC     Status: None   Collection Time: 07/09/23  4:15 AM  Result Value Ref Range   WBC 10.0 4.0 - 10.5 K/uL   RBC 4.40 4.22 - 5.81 MIL/uL   Hemoglobin 14.2 13.0 - 17.0 g/dL   HCT 86.5 78.4 - 69.6 %   MCV 93.0 80.0 - 100.0 fL   MCH 32.3 26.0 - 34.0 pg   MCHC 34.7 30.0 - 36.0 g/dL   RDW 29.5 28.4 - 13.2 %   Platelets 189 150 - 400 K/uL   nRBC 0.0 0.0 - 0.2 %  Protime-INR     Status: None   Collection Time: 07/09/23  4:15 AM  Result Value Ref Range   Prothrombin Time 14.9 11.4 - 15.2 seconds   INR 1.2 0.8 - 1.2  APTT     Status: None   Collection Time: 07/09/23  4:15 AM  Result Value Ref Range   aPTT 35 24 - 36 seconds  Comprehensive metabolic panel     Status: Abnormal   Collection Time: 07/09/23  4:15 AM  Result Value Ref Range   Sodium 131 (L) 135 -  145 mmol/L  Potassium 4.4 3.5 - 5.1 mmol/L   Chloride 100 98 - 111 mmol/L   CO2 25 22 - 32 mmol/L   Glucose, Bld 165 (H) 70 - 99 mg/dL   BUN 24 (H) 8 - 23 mg/dL   Creatinine, Ser 9.14 0.61 - 1.24 mg/dL   Calcium 8.7 (L) 8.9 - 10.3 mg/dL   Total Protein 6.8 6.5 - 8.1 g/dL   Albumin 3.1 (L) 3.5 - 5.0 g/dL   AST 14 (L) 15 - 41 U/L   ALT 14 0 - 44 U/L   Alkaline Phosphatase 89 38 - 126 U/L   Total Bilirubin 0.5 0.0 - 1.2 mg/dL   GFR, Estimated >78 >29 mL/min   Anion gap 6 5 - 15    I have reviewed pertinent nursing notes, vitals, labs, and images as necessary. I have ordered labwork to follow up on as indicated.  I have reviewed the last notes from staff over past 24 hours. I have discussed patient's care plan and test results with nursing staff, CM/SW, and other staff as appropriate.  Time spent: Greater than 50% of the 55 minute visit was spent in counseling/coordination of care for the patient as laid out in the A&P.   LOS: 0 days   Lewie Chamber, MD [facility] 07/09/2023, 12:21 PM

## 2023-07-09 NOTE — Anesthesia Postprocedure Evaluation (Signed)
Anesthesia Post Note  Patient: Nijay Lansing  Procedure(s) Performed: OPEN RIGHT INGUINAL HERNIA REPAIR; DIAGNOSTIC LAPAROSCOPY     Patient location during evaluation: PACU Anesthesia Type: General Level of consciousness: awake and alert Pain management: pain level controlled Vital Signs Assessment: post-procedure vital signs reviewed and stable Respiratory status: spontaneous breathing, nonlabored ventilation and respiratory function stable Cardiovascular status: blood pressure returned to baseline and stable Postop Assessment: no apparent nausea or vomiting Anesthetic complications: no   No notable events documented.  Last Vitals:  Vitals:   07/09/23 1715 07/09/23 1739  BP: (!) 142/85 (!) 138/92  Pulse: (!) 112 (!) 112  Resp: 18 16  Temp: 37.2 C 36.9 C  SpO2: 95% 93%    Last Pain:  Vitals:   07/09/23 1739  TempSrc: Oral  PainSc: 9                  Lowella Curb

## 2023-07-09 NOTE — Assessment & Plan Note (Signed)
-   Crestor on hold for now

## 2023-07-09 NOTE — H&P (Signed)
History and Physical    Sean Martinez YNW:[id number] DOB: Jul 20, 1960 DOA: 07/08/2023  PCP: Garnette Gunner, MD   Patient coming from: Home   Chief Complaint:  Chief Complaint  Patient presents with   Inguinal Hernia   ED TRIAGE note:  BIB friend from home for recurrent R inguinal hernia pain, this episode issues started last Thursday, 8d ago. Waiting for srgery to be scheduled. No longer able to reduce hernia. Mentions NV. V x3 in last 24 hrs. Denies blood/ bleeding, or fever. Has seen Dr. Tresa Endo for the same.       HPI:  Sean Martinez is a 63 y.o. male with medical history significant of COPD, chronic smoker, essential hypertension, hyperlipidemia, non-insulin-dependent DM type II and history of inguinal hernia presented emergency department with complaining of worsening right-sided inguinal hernia pain that started on 1 week ago.  Patient reported that he has been waiting for his scheduled surgery.  He is unable to reduce his hernia.  Patient also had nausea vomiting 3 times in last 24 hours.  Patient reported last bowel movement 1 day ago and he is able to pass flatus.  Patient denies any history of abdominal surgery in the past.  Denies any fever and chill.  No other complaint at this time.   ED Course:  At presentation to ED patient is hypertensive and tachycardic. BMP showing low sodium 132, low bicarb 21, elevated BUN 26. CBC showing leukocytosis 12.1.   CT abdomen pelvis: IMPRESSION: 1. Moderate right inguinal hernia containing the cecum, ileocecal junction, and base of the appendix. Resultant distal small-bowel obstruction. No free intraperitoneal gas or fluid. 2. Mild hepatic steatosis. 3. Moderate pancolonic diverticulosis without superimposed focal inflammatory change.  In the ED patient has been treated with Toradol, Dilaudid and NG tube has been placed. ED physician spoke with on-call general surgery Dr. Derrell Lolling who recommended to place an NG tube  and general surgery will evaluate patient in the a.m.  Hospitalist has been contacted for further evaluation management of small bowel obstruction due to inguinal hernia.  Significant labs in the ED: Lab Orders         CBC with Differential         Basic metabolic panel         HIV Antibody (routine testing w rflx)         CBC         Protime-INR         APTT         Comprehensive metabolic panel       Review of Systems:  Review of Systems  Constitutional:  Negative for chills, fever and malaise/fatigue.  Respiratory:  Negative for cough and shortness of breath.   Cardiovascular:  Negative for chest pain and palpitations.  Gastrointestinal:  Positive for abdominal pain and nausea. Negative for constipation, diarrhea, heartburn and vomiting.  Neurological:  Negative for dizziness and headaches.  Psychiatric/Behavioral:  The patient is not nervous/anxious.   All other systems reviewed and are negative.   Past Medical History:  Diagnosis Date   Asthma    Coronary artery disease    MI, old    Type 2 diabetes mellitus with hyperglycemia, without long-term current use of insulin (HCC) 08/10/2022    Past Surgical History:  Procedure Laterality Date   HERNIA REPAIR     LEFT HEART CATHETERIZATION WITH CORONARY ANGIOGRAM N/A 07/17/2012   Procedure: LEFT HEART CATHETERIZATION WITH CORONARY ANGIOGRAM;  Surgeon: Pamella Pert, MD;  Location: [facility]ATH [facility];  Service: Cardiovascular;  Laterality: N/A;     reports that he has been smoking cigarettes. He started smoking about 47 years ago. He has a 11.7 pack-year smoking history. He has never been exposed to tobacco smoke. He has never used smokeless tobacco. He reports that he does not currently use alcohol. He reports that he does not currently use drugs.  Allergies  Allergen Reactions   Penicillins Hives    Has patient had a PCN reaction causing immediate rash, facial/tongue/throat swelling, SOB or lightheadedness with hypotension:  Yes Has patient had a PCN reaction causing severe rash involving mucus membranes or skin necrosis: No Has patient had a PCN reaction that required hospitalization: No Has patient had a PCN reaction occurring within the last 10 years: No If all of the above answers are "NO", then may proceed with Cephalosporin use.     Family History  Problem Relation Age of Onset   Diabetes Mellitus II Mother     Prior to Admission medications   Medication Sig Start Date End Date Taking? Authorizing Provider  acetaminophen (TYLENOL) 500 MG tablet Take 500 mg by mouth every 6 (six) hours as needed for moderate pain (pain score 4-6).   Yes [provider]  albuterol (VENTOLIN HFA) 108 (90 Base) MCG/ACT inhaler INHALE 2 PUFFS BY MOUTH EVERY 4 HOURS AS NEEDED FOR WHEEZING FOR SHORTNESS OF BREATH Patient taking differently: Inhale 2 puffs into the lungs every 2 (two) hours as needed for shortness of breath or wheezing. 06/06/23  Yes Garnette Gunner, MD  ANORO ELLIPTA 62.5-25 MCG/ACT AEPB INHALE 1 PUFF BY MOUTH ONCE DAILY AT  6:00 AM 06/20/23  Yes Worthy Rancher B, FNP  Colchicine 0.6 MG CAPS At onset of flair, Take 1.2 mg (two tablets). Take 0.6 mg (one tablet) one hour later. Then, take 0.6 mg (one tablet) once daily for up to 7 days as needed for swelling and pain. 06/28/23  Yes Garnette Gunner, MD  metFORMIN (GLUCOPHAGE) 500 MG tablet Take 2 tablets (1,000 mg total) by mouth 2 (two) times daily with a meal. Patient taking differently: Take 500 mg by mouth 2 (two) times daily with a meal. 02/09/23  Yes Garnette Gunner, MD  rosuvastatin (CRESTOR) 5 MG tablet Take 1 tablet (5 mg total) by mouth daily. 02/09/23  Yes Garnette Gunner, MD  Blood Glucose Monitoring Suppl DEVI 1 each by Does not apply route in the morning, at noon, and at bedtime. May substitute to any manufacturer covered by patient's insurance. 07/23/22   Garnette Gunner, MD  indomethacin (INDOCIN) 50 MG capsule Take 1 capsule (50 mg  total) by mouth 3 (three) times daily as needed for moderate pain (pain score 4-6) (severe pain (8-10)). 06/29/23   Garnette Gunner, MD  ipratropium-albuterol (DUONEB) 0.5-2.5 (3) MG/3ML SOLN Take 3 mLs by nebulization every 6 (six) hours as needed (shortness of breath, wheezing, or cough). Patient not taking: Reported on 06/15/2023 03/14/23 06/15/23  Garnette Gunner, MD  Nicotine 10 MG/ML SOLN Use 1 spray in each nostril every 1-2 hours as needed Patient not taking: Reported on 06/15/2023 03/14/23   Garnette Gunner, MD  traMADol (ULTRAM) 50 MG tablet Take 1 tablet (50 mg total) by mouth every 6 (six) hours as needed. Patient not taking: Reported on 06/15/2023 01/13/23   Merrilee Jansky, MD     Physical Exam: Vitals:   07/09/23 0030 07/09/23 0142 07/09/23 0230 07/09/23 0257  BP: (!) 156/106   Marland Kitchen)  139/93  Pulse: (!) 104 (!) 108 (!) 101 (!) 104  Resp:    17  Temp:   98.2 F (36.8 C) 98.2 F (36.8 C)  TempSrc:   Oral Oral  SpO2: 92% 93% 90% 92%  Weight:        Physical Exam Vitals and nursing note reviewed.  Constitutional:      General: He is not in acute distress.    Appearance: He is ill-appearing.  HENT:     Mouth/Throat:     Mouth: Mucous membranes are moist.  Eyes:     Conjunctiva/sclera: Conjunctivae normal.  Cardiovascular:     Rate and Rhythm: Regular rhythm. Tachycardia present.     Pulses: Normal pulses.     Heart sounds: Normal heart sounds.  Pulmonary:     Effort: Pulmonary effort is normal.     Breath sounds: Normal breath sounds.  Abdominal:     General: Bowel sounds are normal. There is no distension.     Palpations: Abdomen is soft. There is no mass.     Tenderness: There is no abdominal tenderness. There is no guarding or rebound.     Hernia: A hernia is present.     Comments: Right-sided inguinal area nonreducible hernia  Musculoskeletal:        General: No swelling.     Cervical back: Normal range of motion and neck supple.     Right lower leg: No  edema.     Left lower leg: No edema.  Skin:    Capillary Refill: Capillary refill takes less than 2 seconds.  Neurological:     Mental Status: He is alert and oriented to person, place, and time.  Psychiatric:        Mood and Affect: Mood normal.      Labs on Admission: I have personally reviewed following labs and imaging studies  CBC: Recent Labs  Lab 07/06/23 1954 07/08/23 2354  WBC 10.2 12.1*  NEUTROABS 7.4 6.9  HGB 15.7 15.0  HCT 46.1 42.9  MCV 94.3 93.7  PLT 188 180   Basic Metabolic Panel: Recent Labs  Lab 07/06/23 1954 07/08/23 2354  NA 135 132*  K 4.5 4.6  CL 101 101  CO2 26 21*  GLUCOSE 128* 158*  BUN 30* 26*  CREATININE 0.88 0.60*  CALCIUM 9.4 8.9   GFR: Estimated Creatinine Clearance: 105.1 mL/min (A) (by C-G formula based on SCr of 0.6 mg/dL (L)). Liver Function Tests: Recent Labs  Lab 07/06/23 1954  AST 17  ALT 22  ALKPHOS 89  BILITOT 0.3  PROT 7.9  ALBUMIN 3.9   Recent Labs  Lab 07/06/23 1954  LIPASE 35   No results for input(s): "AMMONIA" in the last 168 hours. Coagulation Profile: No results for input(s): "INR", "PROTIME" in the last 168 hours. Cardiac Enzymes: No results for input(s): "CKTOTAL", "CKMB", "CKMBINDEX", "TROPONINI", "TROPONINIHS" in the last 168 hours. BNP (last 3 results) No results for input(s): "BNP" in the last 8760 hours. HbA1C: No results for input(s): "HGBA1C" in the last 72 hours. CBG: No results for input(s): "GLUCAP" in the last 168 hours. Lipid Profile: No results for input(s): "CHOL", "HDL", "LDLCALC", "TRIG", "CHOLHDL", "LDLDIRECT" in the last 72 hours. Thyroid Function Tests: No results for input(s): "TSH", "T4TOTAL", "FREET4", "T3FREE", "THYROIDAB" in the last 72 hours. Anemia Panel: No results for input(s): "VITAMINB12", "FOLATE", "FERRITIN", "TIBC", "IRON", "RETICCTPCT" in the last 72 hours. Urine analysis:    Component Value Date/Time   COLORURINE YELLOW 07/23/2022 1042  APPEARANCEUR CLEAR  07/23/2022 1042   LABSPEC <=1.005 (A) 07/23/2022 1042   PHURINE 7.0 07/23/2022 1042   GLUCOSEU 250 (A) 07/23/2022 1042   HGBUR NEGATIVE 07/23/2022 1042   BILIRUBINUR NEGATIVE 07/23/2022 1042   KETONESUR NEGATIVE 07/23/2022 1042   PROTEINUR NEGATIVE 02/06/2018 0933   UROBILINOGEN 0.2 07/23/2022 1042   NITRITE POSITIVE (A) 07/23/2022 1042   LEUKOCYTESUR NEGATIVE 07/23/2022 1042    Radiological Exams on Admission: I have personally reviewed images CT ABDOMEN PELVIS W CONTRAST Result Date: 07/09/2023 CLINICAL DATA:  Bowel obstruction EXAM: CT ABDOMEN AND PELVIS WITH CONTRAST TECHNIQUE: Multidetector CT imaging of the abdomen and pelvis was performed using the standard protocol following bolus administration of intravenous contrast. RADIATION DOSE REDUCTION: This exam was performed according to the departmental dose-optimization program which includes automated exposure control, adjustment of the mA and/or kV according to patient size and/or use of iterative reconstruction technique. CONTRAST:  OMNIPAQUE IOHEXOL 300 MG/ML  SOLN COMPARISON:  None Available. FINDINGS: Lower chest: No acute abnormality. Hepatobiliary: Mild hepatic steatosis. No enhancing intrahepatic mass. No intra or extrahepatic biliary ductal dilation. Gallbladder unremarkable. Pancreas: Unremarkable Spleen: Unremarkable Adrenals/Urinary Tract: Adrenal glands are unremarkable. Kidneys are normal, without renal calculi, focal lesion, or hydronephrosis. Bladder is unremarkable. Stomach/Bowel: Indirect inguinal hernia is present with the cecum, ileocecal junction, and base of the appendix within the inguinal canal. There is resultant distal small-bowel obstruction with fecalized intraluminal contents within the terminal ileum. The colon is decompressed. No free intraperitoneal gas or fluid. Normal enhancement of the herniated bowel within the inguinal canal. Small right hydrocele is present. Moderate pancolonic diverticulosis without  superimposed focal inflammatory change. The stomach, small bowel, large bowel, and appendix are otherwise unremarkable. Vascular/Lymphatic: No significant vascular findings are present. No enlarged abdominal or pelvic lymph nodes. Reproductive: Mild central prostatic hypertrophy. Other: Moderate right inguinal hernia, as above, containing a cecum and ileocecal junction. Musculoskeletal: No acute or significant osseous findings. IMPRESSION: 1. Moderate right inguinal hernia containing the cecum, ileocecal junction, and base of the appendix. Resultant distal small-bowel obstruction. No free intraperitoneal gas or fluid. 2. Mild hepatic steatosis. 3. Moderate pancolonic diverticulosis without superimposed focal inflammatory change. Electronically Signed   By: Helyn Numbers M.D.   On: 07/09/2023 02:18     EKG: Pending EKG    Assessment/Plan: Principal Problem:   SBO (small bowel obstruction) (HCC) Active Problems:   Inguinal hernia   COPD (chronic obstructive pulmonary disease) (HCC)   Continuous dependence on cigarette smoking   Essential hypertension   Hyperlipidemia   Non-insulin dependent type 2 diabetes mellitus (HCC)   Small bowel obstruction (HCC)   Hyponatremia    Assessment and Plan: Small bowel obstruction-secondary to inguinal hernia Right-sided inguinal hernia -Patient presenting with complaining of worsening right-sided abdominal pain.  He has history of right-sided inguinal hernia and supposed to have a electric surgery outpatient.  Coming back to ED with standing abdominal pain and vomiting. - CT abdomen pelvis showed:Moderate right inguinal hernia containing the cecum, ileocecal junction, and base of the appendix. Resultant distal small-bowel obstruction. No free intraperitoneal gas or fluid. -Based on the above finding of small bowel obstruction ED provider spoke with general surgery Dr. Derrell Lolling recommended NG tube placement and general surgery will evaluate patient in the  a.m. - Admitting patient to [facility]. -Continue pain management with morphine 1 mg every 2 hour as needed, continue LR 125 cc/h for 24 hours. -Keeping patient n.p.o. - NG tube has been placed in the ED.  Maintain NG tube with low intermittent suction. -Continue Zofran as needed -Will follow-up with general surgery for further recommendation   Hyponatremia -Slightly low sodium 132.  Hyponatremia secondary to pain response.  Continue to monitor serum sodium level.  Nonanion gap metabolic acidosis Low bicarb 21.  Metabolic acidosis secondary to vomiting.  Continue to monitor.  History of COPD -Stable - Continue Anoro Ellipta once daily and DuoNeb as needed  Essential hypertension -At home patient is not any blood pressure regimen - Patient's blood pressure is elevated while in the ED like he secondary in the setting of pain response. - Continue hydralazine as needed.  Hyperlipidemia -NPO.  Holding Lipitor.  Non-insulin-dependent DM type II -Current patient is NPO.  Holding metformin.  History of smoking - Patient currently smoking half a pack cigarette in a day.  Starting nicotine patch 40 mg daily. - Patient has been counseled at the bedside for smoking cessation.  DVT prophylaxis:  SQ Heparin Code Status:  Full Code Diet: Currently NPO. Family Communication: None present Disposition Plan: Continue NG tube intermittent suction.  Will follow-up with general surgery for further recommendation for management for SBO and inguinal hernia. Consults: General Surgery Admission status:   Inpatient, Telemetry bed-at [facility]  Severity of Illness: The appropriate patient status for this patient is INPATIENT. Inpatient status is judged to be reasonable and necessary in order to provide the required intensity of service to ensure the patient's safety. The patient's presenting symptoms, physical exam findings, and initial radiographic and laboratory data in the  context of their chronic comorbidities is felt to place them at high risk for further clinical deterioration. Furthermore, it is not anticipated that the patient will be medically stable for discharge from the hospital within 2 midnights of admission.   * I certify that at the point of admission it is my clinical judgment that the patient will require inpatient hospital care spanning beyond 2 midnights from the point of admission due to high intensity of service, high risk for further deterioration and high frequency of surveillance required.Marland Kitchen    Tereasa Coop, MD [facility]  How to contact the [facility] Attending or Consulting provider 7A - 7P or covering provider during after hours 7P -7A, for this patient.  Check the care team in [facility] and look for a) attending/consulting [facility] provider listed and b) the [facility] team listed Log into [email or web address] and use [place]'s universal password to access. If you do not have the password, please contact the hospital operator. Locate the [facility] provider you are looking for under [facility] and page to a number that you can be directly reached. If you still have difficulty reaching the provider, please page the [facility] (Director on Call) for the Hospitalists listed on amion for assistance.  07/09/2023, 4:37 AM

## 2023-07-09 NOTE — Hospital Course (Signed)
Mr. Schlesser is a 63 yo male with PMH left inguinal hernia (repaired in teenage years), CAD, DMII, asthma who presented with ongoing right inguinal hernia.  Typically he is able to reduce it but states it became unreducible and exquisitely painful.  He also developed nausea and vomiting. CT abdomen/pelvis showed incarcerated right inguinal hernia with distal small bowel obstruction.  He underwent NG tube placement and was admitted for surgical evaluation.

## 2023-07-09 NOTE — Assessment & Plan Note (Signed)
-   nicotine patch as needed 

## 2023-07-09 NOTE — ED Provider Notes (Signed)
[facility]-EMERGENCY DEPT [facility] Emergency Department Provider Note MRN:  [id number]  Arrival date & time: 07/09/23     Chief Complaint   Inguinal Hernia   History of Present Illness   Sean Martinez is a 63 y.o. year-old male presents to the ED with chief complaint of abdominal pain.  Reports associated vomiting.  States that he has a known inguinal hernia that he has not been able to reduce for the past week or so.  He states that it is becoming more and more uncomfortable.  He is working to schedule surgery.  He states that he could not tolerate the pain, so he came to the emergency department for evaluation.Marland Kitchen  History provided by patient.   Review of Systems  Pertinent positive and negative review of systems noted in HPI.    Physical Exam   Vitals:   07/09/23 0459 07/09/23 0500  BP:  132/84  Pulse: (!) 102 (!) 104  Resp: 16 17  Temp:  98 F (36.7 C)  SpO2: 93% 93%    CONSTITUTIONAL:  uncomfortable-appearing, NAD NEURO:  Alert and oriented x 3, CN 3-12 grossly intact EYES:  eyes equal and reactive ENT/NECK:  Supple, no stridor  CARDIO:  tachycardic, regular rhythm, appears well-perfused  PULM:  No respiratory distress, CTAB GI/GU:  non-distended, large right inguinal hernia, unable to reduce MSK/SPINE:  No gross deformities, no edema, moves all extremities  SKIN:  no rash, atraumatic   *Additional and/or pertinent findings included in MDM below  Diagnostic and Interventional Summary    EKG Interpretation Date/Time:    Ventricular Rate:    PR Interval:    QRS Duration:    QT Interval:    QTC Calculation:   R Axis:      Text Interpretation:         Labs Reviewed  CBC WITH DIFFERENTIAL/PLATELET - Abnormal; Notable for the following components:      Result Value   WBC 12.1 (*)    Monocytes Absolute 1.8 (*)    Basophils Absolute 0.2 (*)    All other components within normal limits  BASIC METABOLIC PANEL - Abnormal; Notable for the following  components:   Sodium 132 (*)    CO2 21 (*)    Glucose, Bld 158 (*)    BUN 26 (*)    Creatinine, Ser 0.60 (*)    All other components within normal limits  COMPREHENSIVE METABOLIC PANEL - Abnormal; Notable for the following components:   Sodium 131 (*)    Glucose, Bld 165 (*)    BUN 24 (*)    Calcium 8.7 (*)    Albumin 3.1 (*)    AST 14 (*)    All other components within normal limits  CBC  PROTIME-INR  APTT  HIV ANTIBODY (ROUTINE TESTING W REFLEX)    DG Abd Portable 1 View  Final Result    CT ABDOMEN PELVIS W CONTRAST  Final Result      Medications  umeclidinium-vilanterol (ANORO ELLIPTA) 62.5-25 MCG/ACT 1 puff (has no administration in time range)  ipratropium-albuterol (DUONEB) 0.5-2.5 (3) MG/3ML nebulizer solution 3 mL (has no administration in time range)  heparin injection 5,000 Units (has no administration in time range)  lactated ringers infusion ( Intravenous New Bag/Given 07/09/23 0434)  sodium chloride flush (NS) 0.9 % injection 3 mL (3 mLs Intravenous Given 07/09/23 0438)  sodium chloride flush (NS) 0.9 % injection 3 mL (has no administration in time range)  0.9 %  sodium chloride infusion (  has no administration in time range)  ondansetron (ZOFRAN) tablet 4 mg (has no administration in time range)    Or  ondansetron (ZOFRAN) injection 4 mg (has no administration in time range)  hydrALAZINE (APRESOLINE) injection 10 mg (has no administration in time range)  morphine (PF) 2 MG/ML injection 1 mg (1 mg Intravenous Given 07/09/23 0413)  nicotine (NICODERM CQ - dosed in mg/24 hours) patch 14 mg (has no administration in time range)  HYDROmorphone (DILAUDID) injection 1 mg (1 mg Intravenous Given 07/08/23 2353)  ondansetron (ZOFRAN) injection 4 mg (4 mg Intravenous Given 07/08/23 2351)  iohexol (OMNIPAQUE) 300 MG/ML solution 100 mL (100 mLs Intravenous Contrast Given 07/09/23 0100)  ketorolac (TORADOL) 30 MG/ML injection 30 mg (30 mg Intravenous Given 07/09/23 0256)      Procedures  /  Critical Care Procedures  ED Course and Medical Decision Making  I have reviewed the triage vital signs, the nursing notes, and pertinent available records from the EMR.  Social Determinants Affecting Complexity of Care: Patient has no clinically significant social determinants affecting this chief complaint..   ED Course:    Medical Decision Making Patient here with right inguinal hernia that has been increasing in size and in pain.  Will check labs and imaging.  He is also had vomiting this morning.  CT is notable for large right inguinal hernia that appears to be causing distal small bowel obstruction.  I believe this explains the patient's pain and his vomiting from earlier.  I consulted with general surgery as below.  Will admit to medicine.  Amount and/or Complexity of Data Reviewed Labs: ordered.    Details: Mild leukocytosis, no anemia Radiology: ordered and independent interpretation performed.    Details: Large right inguinal hernia  Risk Prescription drug management. Decision regarding hospitalization.         Consultants: I consulted with Hospitalist, Dr. Janalyn Shy, who is appreciated for admitting. I consulted with Dr. Derrell Lolling, who recommends NG tub and will have patient seen in the AM.  Recommends medicine admission.  Treatment and Plan: Patient's exam and diagnostic results are concerning for SBO 2/2 inguinal hernia.  Feel that patient will need admission to the hospital for further treatment and evaluation.    Final Clinical Impressions(s) / ED Diagnoses     ICD-10-CM   1. SBO (small bowel obstruction) (HCC)  K56.609     2. Right inguinal hernia  K40.90       ED Discharge Orders     None         Discharge Instructions Discussed with and Provided to Patient:   Discharge Instructions   None      Roxy Horseman, PA-C 07/09/23 0557    Palumbo, April, MD 07/09/23 [phone]

## 2023-07-10 DIAGNOSIS — K4031 Unilateral inguinal hernia, with obstruction, without gangrene, recurrent: Secondary | ICD-10-CM | POA: Diagnosis not present

## 2023-07-10 DIAGNOSIS — K56609 Unspecified intestinal obstruction, unspecified as to partial versus complete obstruction: Secondary | ICD-10-CM | POA: Diagnosis not present

## 2023-07-10 DIAGNOSIS — I10 Essential (primary) hypertension: Secondary | ICD-10-CM | POA: Diagnosis not present

## 2023-07-10 DIAGNOSIS — E871 Hypo-osmolality and hyponatremia: Secondary | ICD-10-CM | POA: Diagnosis not present

## 2023-07-10 LAB — CBC WITH DIFFERENTIAL/PLATELET
Abs Immature Granulocytes: 0.05 10*3/uL (ref 0.00–0.07)
Basophils Absolute: 0 10*3/uL (ref 0.0–0.1)
Basophils Relative: 1 %
Eosinophils Absolute: 0 10*3/uL (ref 0.0–0.5)
Eosinophils Relative: 0 %
HCT: 42 % (ref 39.0–52.0)
Hemoglobin: 14.1 g/dL (ref 13.0–17.0)
Immature Granulocytes: 1 %
Lymphocytes Relative: 20 %
Lymphs Abs: 1.7 10*3/uL (ref 0.7–4.0)
MCH: 32.4 pg (ref 26.0–34.0)
MCHC: 33.6 g/dL (ref 30.0–36.0)
MCV: 96.6 fL (ref 80.0–100.0)
Monocytes Absolute: 1.2 10*3/uL — ABNORMAL HIGH (ref 0.1–1.0)
Monocytes Relative: 14 %
Neutro Abs: 5.7 10*3/uL (ref 1.7–7.7)
Neutrophils Relative %: 64 %
Platelets: 187 10*3/uL (ref 150–400)
RBC: 4.35 MIL/uL (ref 4.22–5.81)
RDW: 13.7 % (ref 11.5–15.5)
WBC: 8.7 10*3/uL (ref 4.0–10.5)
nRBC: 0 % (ref 0.0–0.2)

## 2023-07-10 LAB — GLUCOSE, CAPILLARY
Glucose-Capillary: 135 mg/dL — ABNORMAL HIGH (ref 70–99)
Glucose-Capillary: 138 mg/dL — ABNORMAL HIGH (ref 70–99)
Glucose-Capillary: 160 mg/dL — ABNORMAL HIGH (ref 70–99)
Glucose-Capillary: 160 mg/dL — ABNORMAL HIGH (ref 70–99)
Glucose-Capillary: 202 mg/dL — ABNORMAL HIGH (ref 70–99)
Glucose-Capillary: 211 mg/dL — ABNORMAL HIGH (ref 70–99)
Glucose-Capillary: 223 mg/dL — ABNORMAL HIGH (ref 70–99)

## 2023-07-10 LAB — BASIC METABOLIC PANEL
Anion gap: 12 (ref 5–15)
BUN: 16 mg/dL (ref 8–23)
CO2: 21 mmol/L — ABNORMAL LOW (ref 22–32)
Calcium: 8.1 mg/dL — ABNORMAL LOW (ref 8.9–10.3)
Chloride: 99 mmol/L (ref 98–111)
Creatinine, Ser: 0.76 mg/dL (ref 0.61–1.24)
GFR, Estimated: >60 mL/min (ref >60)
Glucose, Bld: 126 mg/dL — ABNORMAL HIGH (ref 70–99)
Potassium: 3.9 mmol/L (ref 3.5–5.1)
Sodium: 132 mmol/L — ABNORMAL LOW (ref 135–145)

## 2023-07-10 LAB — MAGNESIUM: Magnesium: 2 mg/dL (ref 1.7–2.4)

## 2023-07-10 MED ORDER — LACTATED RINGERS IV SOLN: INTRAVENOUS | Status: AC

## 2023-07-10 MED ORDER — MORPHINE SULFATE (PF) 2 MG/ML IV SOLN
2.0000 mg | INTRAVENOUS | Status: DC | PRN
  Administered 2023-07-10 – 2023-07-12 (×6): 2 mg via INTRAVENOUS
  Filled 2023-07-10 (×6): qty 1

## 2023-07-10 MED ORDER — OXYCODONE HCL 5 MG PO TABS
5.0000 mg | ORAL_TABLET | ORAL | Status: DC | PRN
  Administered 2023-07-10 – 2023-07-13 (×13): 5 mg via ORAL
  Filled 2023-07-10 (×13): qty 1

## 2023-07-10 NOTE — Progress Notes (Signed)
1 Day Post-Op  Subjective: Overall doing well. + Flatus. Pain relatively well controlled. Denies new complaints.   NG>752mL overnight but he reports drinking multiple cups of ice/water and the output appears thin.   ROS: See above, otherwise other systems negative  Objective: Vital signs in last 24 hours: Temp:  [98.4 F (36.9 C)-99.8 F (37.7 C)] 99.1 F (37.3 C) (01/19 0510) Pulse Rate:  [103-122] 109 (01/19 0510) Resp:  [16-23] 17 (01/19 0510) BP: (132-153)/(75-102) 132/75 (01/19 0510) SpO2:  [92 %-97 %] 93 % (01/19 0758) Last BM Date : 07/08/23  Intake/Output from previous day: 01/18 0701 - 01/19 0700 In: 1405 [P.O.:90; I.V.:965; IV Piggyback:350] Out: 1335 [Urine:610; Emesis/NG output:700; Blood:25] Intake/Output this shift: No intake/output data recorded.  PE: Gen: male, NAD Abd: soft, non-distended, port sites clean and dry Groin: incision clean and dry with dermabond intact, + scrotal swelling  Lab Results:  Recent Labs    07/09/23 0415 07/10/23 0525  WBC 10.0 8.7  HGB 14.2 14.1  HCT 40.9 42.0  PLT 189 187   BMET Recent Labs    07/09/23 0415 07/10/23 0525  NA 131* 132*  K 4.4 3.9  CL 100 99  CO2 25 21*  GLUCOSE 165* 126*  BUN 24* 16  CREATININE 0.71 0.76  CALCIUM 8.7* 8.1*   PT/INR Recent Labs    07/09/23 0415  LABPROT 14.9  INR 1.2   CMP     Component Value Date/Time   NA 132 (L) 07/10/2023 0525   K 3.9 07/10/2023 0525   CL 99 07/10/2023 0525   CO2 21 (L) 07/10/2023 0525   GLUCOSE 126 (H) 07/10/2023 0525   BUN 16 07/10/2023 0525   CREATININE 0.76 07/10/2023 0525   CREATININE 0.93 07/22/2022 1628   CALCIUM 8.1 (L) 07/10/2023 0525   PROT 6.8 07/09/2023 0415   ALBUMIN 3.1 (L) 07/09/2023 0415   AST 14 (L) 07/09/2023 0415   ALT 14 07/09/2023 0415   ALKPHOS 89 07/09/2023 0415   BILITOT 0.5 07/09/2023 0415   GFRNONAA >60 07/10/2023 0525   GFRAA >60 04/12/2019 1349   Lipase     Component Value Date/Time   LIPASE 35  07/06/2023 1954    Studies/Results: DG Abd Portable 1 View Result Date: 07/09/2023 CLINICAL DATA:  NG tube placement EXAM: PORTABLE ABDOMEN - 1 VIEW COMPARISON:  CT abdomen pelvis 07/09/2023. FINDINGS: Enteric tube courses below the hemidiaphragm with tip and side port overlying the expected region the gastric lumen. The bowel gas pattern is normal. No radio-opaque calculi or other significant radiographic abnormality are seen. IMPRESSION: Enteric tube in good position. Electronically Signed   By: Tish Frederickson M.D.   On: 07/09/2023 05:26   CT ABDOMEN PELVIS W CONTRAST Result Date: 07/09/2023 CLINICAL DATA:  Bowel obstruction EXAM: CT ABDOMEN AND PELVIS WITH CONTRAST TECHNIQUE: Multidetector CT imaging of the abdomen and pelvis was performed using the standard protocol following bolus administration of intravenous contrast. RADIATION DOSE REDUCTION: This exam was performed according to the departmental dose-optimization program which includes automated exposure control, adjustment of the mA and/or kV according to patient size and/or use of iterative reconstruction technique. CONTRAST:  OMNIPAQUE IOHEXOL 300 MG/ML  SOLN COMPARISON:  None Available. FINDINGS: Lower chest: No acute abnormality. Hepatobiliary: Mild hepatic steatosis. No enhancing intrahepatic mass. No intra or extrahepatic biliary ductal dilation. Gallbladder unremarkable. Pancreas: Unremarkable Spleen: Unremarkable Adrenals/Urinary Tract: Adrenal glands are unremarkable. Kidneys are normal, without renal calculi, focal lesion, or hydronephrosis. Bladder is unremarkable. Stomach/Bowel: Indirect  inguinal hernia is present with the cecum, ileocecal junction, and base of the appendix within the inguinal canal. There is resultant distal small-bowel obstruction with fecalized intraluminal contents within the terminal ileum. The colon is decompressed. No free intraperitoneal gas or fluid. Normal enhancement of the herniated bowel within the  inguinal canal. Small right hydrocele is present. Moderate pancolonic diverticulosis without superimposed focal inflammatory change. The stomach, small bowel, large bowel, and appendix are otherwise unremarkable. Vascular/Lymphatic: No significant vascular findings are present. No enlarged abdominal or pelvic lymph nodes. Reproductive: Mild central prostatic hypertrophy. Other: Moderate right inguinal hernia, as above, containing a cecum and ileocecal junction. Musculoskeletal: No acute or significant osseous findings. IMPRESSION: 1. Moderate right inguinal hernia containing the cecum, ileocecal junction, and base of the appendix. Resultant distal small-bowel obstruction. No free intraperitoneal gas or fluid. 2. Mild hepatic steatosis. 3. Moderate pancolonic diverticulosis without superimposed focal inflammatory change. Electronically Signed   By: Helyn Numbers M.D.   On: 07/09/2023 02:18    Anti-infectives: Anti-infectives (From admission, onward)    Start     Dose/Rate Route Frequency Ordered Stop   07/09/23 1345  ceFAZolin (ANCEF) IVPB 2g/100 mL premix        2 g 200 mL/hr over 30 Minutes Intravenous  Once 07/09/23 1331 07/09/23 1400   07/09/23 1326  ceFAZolin (ANCEF) 2-4 GM/100ML-% IVPB       Note to Pharmacy: Raelyn Number J: cabinet override      07/09/23 1326 07/09/23 1355       Assessment/Plan 63 y/o M who presented with an incarcerated right inguinal hernia and is now s/p open right inguinal hernia repair with mesh and diagnostic laparoscopy (1/18, Krystal Delduca)  - NGT removed at bedside - CLD. ADAT  - OOB w/ ambulation  - Home when tolerating PO and having bowel function    LOS: 1 day   [facility] 07/10/2023, 7:59 AM Please see Amion for pager number during day hours 7:00am-4:30pm or 7:00am -11:30am on weekends

## 2023-07-10 NOTE — Plan of Care (Signed)
  Problem: Clinical Measurements: Goal: Respiratory complications will improve Outcome: Progressing   Problem: Activity: Goal: Risk for activity intolerance will decrease Outcome: Progressing   Problem: Nutrition: Goal: Adequate nutrition will be maintained Outcome: Progressing   Problem: Elimination: Goal: Will not experience complications related to bowel motility Outcome: Progressing   Problem: Pain Managment: Goal: General experience of comfort will improve and/or be controlled 07/10/2023 2020 by Josph Macho, RN Outcome: Progressing 07/10/2023 2006 by Josph Macho, RN Outcome: Progressing   Problem: Safety: Goal: Ability to remain free from injury will improve 07/10/2023 2020 by Josph Macho, RN Outcome: Progressing 07/10/2023 2006 by Josph Macho, RN Outcome: Progressing

## 2023-07-10 NOTE — Progress Notes (Signed)
   07/10/23 1336  Assess: MEWS Score  Temp 99.6 F (37.6 C)  BP 134/81  MAP (mmHg) 96  Pulse Rate (!) 113  Resp 20  SpO2 96 %  O2 Device Nasal Cannula  O2 Flow Rate (L/min) 2 L/min  Assess: MEWS Score  MEWS Temp 0  MEWS Systolic 0  MEWS Pulse 2  MEWS RR 0  MEWS LOC 0  MEWS Score 2  MEWS Score Color Yellow  Assess: if the MEWS score is Yellow or Red  Were vital signs accurate and taken at a resting state? Yes  Does the patient meet 2 or more of the SIRS criteria? No  Does the patient have a confirmed or suspected source of infection? No  MEWS guidelines implemented  No, previously yellow, continue vital signs every 4 hours  Assess: SIRS CRITERIA  SIRS Temperature  0  SIRS Respirations  0  SIRS Pulse 1  SIRS WBC 0  SIRS Score Sum  1

## 2023-07-10 NOTE — Plan of Care (Signed)
  Problem: Clinical Measurements: Goal: Respiratory complications will improve Outcome: Progressing   Problem: Activity: Goal: Risk for activity intolerance will decrease Outcome: Progressing   Problem: Nutrition: Goal: Adequate nutrition will be maintained Outcome: Progressing   Problem: Elimination: Goal: Will not experience complications related to bowel motility Outcome: Progressing   

## 2023-07-10 NOTE — Progress Notes (Signed)
Progress Note    Sean Martinez   [id number]  DOB: January 22, 1961  DOA: 07/08/2023     1 PCP: Garnette Gunner, MD  Initial CC: R inguinal hernia, abd pain  Hospital Course: Mr. Sean Martinez is a 63 yo male with PMH left inguinal hernia (repaired in teenage years), CAD, DMII, asthma who presented with ongoing right inguinal hernia.  Typically he is able to reduce it but states it became unreducible and exquisitely painful.  He also developed nausea and vomiting. CT abdomen/pelvis showed incarcerated right inguinal hernia with distal small bowel obstruction.  He underwent NG tube placement and was admitted for surgical evaluation.  Interval History:  Tolerated surgery well yesterday.  Feeling okay this morning especially after NG tube removed.  No nausea since.  Denies having had any bowel movement but does state he had flatus since surgery.  Abdomen still a little sore but otherwise feeling okay.  Assessment and Plan: * SBO (small bowel obstruction) (HCC) - due to incarcerated right inguinal hernia -Confirmed on CT abdomen/pelvis on admission as well - General surgery following, appreciate assistance - underwent open right inguinal hernia repair with mesh and diagnostic laparoscopy on 07/09/2023 - NG tube removed per surgery on 07/10/2023 - Clear liquid diet started.  Monitor for tolerance and return of bowel function - Ambulate as able  Inguinal hernia-resolved as of 07/10/2023 - see SBO  Hyponatremia - mild, asymptomatic  - continue IVF  Essential hypertension - BP improved this morning - PRN meds for now  Non-insulin dependent type 2 diabetes mellitus (HCC) - SSI and CBG - q4h while on CLD still; can change once diet further advanced   Hyperlipidemia - Crestor on hold for now  Continuous dependence on cigarette smoking - nicotine patch as needed  COPD (chronic obstructive pulmonary disease) (HCC) - no s/s exac   Old records reviewed in assessment of this  patient  Antimicrobials:   DVT prophylaxis:  heparin injection 5,000 Units Start: 07/09/23 0600 SCDs Start: 07/09/23 0315 Place TED hose Start: 07/09/23 0315   Code Status:   Code Status: Full Code  Mobility Assessment (Last 72 Hours)     Mobility Assessment     Row Name 07/10/23 0716 07/09/23 1945         Does patient have an order for bedrest or is patient medically unstable No - Continue assessment No - Continue assessment      What is the highest level of mobility based on the progressive mobility assessment? Level 3 (Stands with assist) - Balance while standing  and cannot march in place Level 2 (Chairfast) - Balance while sitting on edge of bed and cannot stand      Is the above level different from baseline mobility prior to current illness? Yes - Recommend PT order Yes - Recommend PT order               Barriers to discharge: none Disposition Plan:  Home HH orders placed: n/a Status is: Inpt  Objective: Blood pressure 119/76, pulse (!) 110, temperature 99.2 F (37.3 C), temperature source Oral, resp. rate 18, weight 89.8 kg, SpO2 93%.  Examination:  Physical Exam Constitutional:      General: He is not in acute distress.    Appearance: Normal appearance.  HENT:     Head: Normocephalic and atraumatic.     Mouth/Throat:     Mouth: Mucous membranes are moist.  Eyes:     Extraocular Movements: Extraocular movements intact.  Cardiovascular:  Rate and Rhythm: Normal rate and regular rhythm.  Pulmonary:     Effort: Pulmonary effort is normal. No respiratory distress.     Breath sounds: Normal breath sounds. No wheezing.  Abdominal:     General: Bowel sounds are normal. There is no distension.     Palpations: Abdomen is soft.     Tenderness: There is no abdominal tenderness.     Hernia: A hernia is present. Hernia is present in the right inguinal area (nonreducible and TTP).  Musculoskeletal:        General: Normal range of motion.     Cervical back:  Normal range of motion and neck supple.  Skin:    General: Skin is warm and dry.  Neurological:     General: No focal deficit present.     Mental Status: He is alert.  Psychiatric:        Mood and Affect: Mood normal.        Behavior: Behavior normal.      Consultants:  General surgery  Procedures:  07/09/2023: Procedure: Open right inguinal hernia repair with mesh Diagnostic laparoscopy  Data Reviewed: Results for orders placed or performed during the hospital encounter of 07/08/23 (from the past 24 hours)  Glucose, capillary     Status: Abnormal   Collection Time: 07/09/23  4:42 PM  Result Value Ref Range   Glucose-Capillary 136 (H) 70 - 99 mg/dL  Glucose, capillary     Status: Abnormal   Collection Time: 07/09/23  8:11 PM  Result Value Ref Range   Glucose-Capillary 138 (H) 70 - 99 mg/dL  Glucose, capillary     Status: Abnormal   Collection Time: 07/10/23  1:13 AM  Result Value Ref Range   Glucose-Capillary 135 (H) 70 - 99 mg/dL  Glucose, capillary     Status: Abnormal   Collection Time: 07/10/23  4:57 AM  Result Value Ref Range   Glucose-Capillary 160 (H) 70 - 99 mg/dL  Basic metabolic panel     Status: Abnormal   Collection Time: 07/10/23  5:25 AM  Result Value Ref Range   Sodium 132 (L) 135 - 145 mmol/L   Potassium 3.9 3.5 - 5.1 mmol/L   Chloride 99 98 - 111 mmol/L   CO2 21 (L) 22 - 32 mmol/L   Glucose, Bld 126 (H) 70 - 99 mg/dL   BUN 16 8 - 23 mg/dL   Creatinine, Ser 1.61 0.61 - 1.24 mg/dL   Calcium 8.1 (L) 8.9 - 10.3 mg/dL   GFR, Estimated >09 >60 mL/min   Anion gap 12 5 - 15  CBC with Differential/Platelet     Status: Abnormal   Collection Time: 07/10/23  5:25 AM  Result Value Ref Range   WBC 8.7 4.0 - 10.5 K/uL   RBC 4.35 4.22 - 5.81 MIL/uL   Hemoglobin 14.1 13.0 - 17.0 g/dL   HCT 45.4 09.8 - 11.9 %   MCV 96.6 80.0 - 100.0 fL   MCH 32.4 26.0 - 34.0 pg   MCHC 33.6 30.0 - 36.0 g/dL   RDW 14.7 82.9 - 56.2 %   Platelets 187 150 - 400 K/uL   nRBC 0.0  0.0 - 0.2 %   Neutrophils Relative % 64 %   Neutro Abs 5.7 1.7 - 7.7 K/uL   Lymphocytes Relative 20 %   Lymphs Abs 1.7 0.7 - 4.0 K/uL   Monocytes Relative 14 %   Monocytes Absolute 1.2 (H) 0.1 - 1.0 K/uL   Eosinophils Relative 0 %  Eosinophils Absolute 0.0 0.0 - 0.5 K/uL   Basophils Relative 1 %   Basophils Absolute 0.0 0.0 - 0.1 K/uL   Immature Granulocytes 1 %   Abs Immature Granulocytes 0.05 0.00 - 0.07 K/uL  Magnesium     Status: None   Collection Time: 07/10/23  5:25 AM  Result Value Ref Range   Magnesium 2.0 1.7 - 2.4 mg/dL  Glucose, capillary     Status: Abnormal   Collection Time: 07/10/23  8:25 AM  Result Value Ref Range   Glucose-Capillary 138 (H) 70 - 99 mg/dL  Glucose, capillary     Status: Abnormal   Collection Time: 07/10/23 11:25 AM  Result Value Ref Range   Glucose-Capillary 223 (H) 70 - 99 mg/dL    I have reviewed pertinent nursing notes, vitals, labs, and images as necessary. I have ordered labwork to follow up on as indicated.  I have reviewed the last notes from staff over past 24 hours. I have discussed patient's care plan and test results with nursing staff, CM/SW, and other staff as appropriate.  Time spent: Greater than 50% of the 55 minute visit was spent in counseling/coordination of care for the patient as laid out in the A&P.   LOS: 1 day   Lewie Chamber, MD [facility] 07/10/2023, 1:16 PM

## 2023-07-11 ENCOUNTER — Encounter (HOSPITAL_COMMUNITY): Payer: Self-pay | Admitting: General Surgery

## 2023-07-11 DIAGNOSIS — K56609 Unspecified intestinal obstruction, unspecified as to partial versus complete obstruction: Secondary | ICD-10-CM | POA: Diagnosis not present

## 2023-07-11 DIAGNOSIS — K4031 Unilateral inguinal hernia, with obstruction, without gangrene, recurrent: Secondary | ICD-10-CM | POA: Diagnosis not present

## 2023-07-11 LAB — CBC WITH DIFFERENTIAL/PLATELET
Abs Immature Granulocytes: 0.03 10*3/uL (ref 0.00–0.07)
Basophils Absolute: 0 10*3/uL (ref 0.0–0.1)
Basophils Relative: 0 %
Eosinophils Absolute: 0 10*3/uL (ref 0.0–0.5)
Eosinophils Relative: 0 %
HCT: 38.6 % — ABNORMAL LOW (ref 39.0–52.0)
Hemoglobin: 13.4 g/dL (ref 13.0–17.0)
Immature Granulocytes: 0 %
Lymphocytes Relative: 21 %
Lymphs Abs: 1.6 10*3/uL (ref 0.7–4.0)
MCH: 32.8 pg (ref 26.0–34.0)
MCHC: 34.7 g/dL (ref 30.0–36.0)
MCV: 94.6 fL (ref 80.0–100.0)
Monocytes Absolute: 1.1 10*3/uL — ABNORMAL HIGH (ref 0.1–1.0)
Monocytes Relative: 15 %
Neutro Abs: 4.6 10*3/uL (ref 1.7–7.7)
Neutrophils Relative %: 64 %
Platelets: 182 10*3/uL (ref 150–400)
RBC: 4.08 MIL/uL — ABNORMAL LOW (ref 4.22–5.81)
RDW: 13.5 % (ref 11.5–15.5)
WBC: 7.4 10*3/uL (ref 4.0–10.5)
nRBC: 0 % (ref 0.0–0.2)

## 2023-07-11 LAB — BASIC METABOLIC PANEL
Anion gap: 8 (ref 5–15)
BUN: 11 mg/dL (ref 8–23)
CO2: 26 mmol/L (ref 22–32)
Calcium: 7.8 mg/dL — ABNORMAL LOW (ref 8.9–10.3)
Chloride: 98 mmol/L (ref 98–111)
Creatinine, Ser: 0.59 mg/dL — ABNORMAL LOW (ref 0.61–1.24)
GFR, Estimated: >60 mL/min (ref >60)
Glucose, Bld: 144 mg/dL — ABNORMAL HIGH (ref 70–99)
Potassium: 3.6 mmol/L (ref 3.5–5.1)
Sodium: 132 mmol/L — ABNORMAL LOW (ref 135–145)

## 2023-07-11 LAB — GLUCOSE, CAPILLARY
Glucose-Capillary: 151 mg/dL — ABNORMAL HIGH (ref 70–99)
Glucose-Capillary: 135 mg/dL — ABNORMAL HIGH (ref 70–99)
Glucose-Capillary: 128 mg/dL — ABNORMAL HIGH (ref 70–99)
Glucose-Capillary: 195 mg/dL — ABNORMAL HIGH (ref 70–99)
Glucose-Capillary: 213 mg/dL — ABNORMAL HIGH (ref 70–99)

## 2023-07-11 LAB — MAGNESIUM: Magnesium: 2 mg/dL (ref 1.7–2.4)

## 2023-07-11 MED ORDER — DOCUSATE SODIUM 100 MG PO CAPS
100.0000 mg | ORAL_CAPSULE | Freq: Two times a day (BID) | ORAL | Status: DC
  Administered 2023-07-11 – 2023-07-13 (×5): 100 mg via ORAL
  Filled 2023-07-11 (×5): qty 1

## 2023-07-11 NOTE — Progress Notes (Signed)
Progress Note    Sean Martinez   [id number]  DOB: 1960-08-14  DOA: 07/08/2023     2 PCP: Garnette Gunner, MD  Initial CC: R inguinal hernia, abd pain  Hospital Course: Sean Martinez is a 63 yo male with PMH left inguinal hernia (repaired in teenage years), CAD, DMII, asthma who presented with ongoing right inguinal hernia.  Typically he is able to reduce it but states it became unreducible and exquisitely painful.  He also developed nausea and vomiting. CT abdomen/pelvis showed incarcerated right inguinal hernia with distal small bowel obstruction.  He underwent NG tube placement and was admitted for surgical evaluation.  Interval History:  No events overnight.  Tolerating clear liquids and still having flatus but no bowel movement yet..  Denies any nausea. Diet being further advanced today per surgery.  Encouraged him to continue ambulating.  Assessment and Plan: * SBO (small bowel obstruction) (HCC) - due to incarcerated right inguinal hernia -Confirmed on CT abdomen/pelvis on admission as well - General surgery following, appreciate assistance - underwent open right inguinal hernia repair with mesh and diagnostic laparoscopy on 07/09/2023 - NG tube removed per surgery on 07/10/2023 - Tolerated clears.  Advance to full's today per surgery.  Still having flatus but no bowel movement yet -Encouraged to continue ambulating  Inguinal hernia-resolved as of 07/10/2023 - see SBO  Hyponatremia - mild, asymptomatic  - s/p IVF  Essential hypertension - BP improved this morning - PRN meds for now  Non-insulin dependent type 2 diabetes mellitus (HCC) - SSI and CBG - q4h while on FLD still; can change once diet further advanced to solids  Hyperlipidemia - Crestor on hold for now  Continuous dependence on cigarette smoking - nicotine patch as needed  COPD (chronic obstructive pulmonary disease) (HCC) - no s/s exac   Old records reviewed in assessment of this  patient  Antimicrobials:   DVT prophylaxis:  heparin injection 5,000 Units Start: 07/09/23 0600 SCDs Start: 07/09/23 0315 Place TED hose Start: 07/09/23 0315   Code Status:   Code Status: Full Code  Mobility Assessment (Last 72 Hours)     Mobility Assessment     Row Name 07/11/23 0900 07/10/23 2000 07/10/23 0716 07/09/23 1945     Does patient have an order for bedrest or is patient medically unstable No - Continue assessment No - Continue assessment No - Continue assessment No - Continue assessment    What is the highest level of mobility based on the progressive mobility assessment? Level 5 (Walks with assist in room/hall) - Balance while stepping forward/back and can walk in room with assist - Complete Level 3 (Stands with assist) - Balance while standing  and cannot march in place Level 3 (Stands with assist) - Balance while standing  and cannot march in place Level 2 (Chairfast) - Balance while sitting on edge of bed and cannot stand    Is the above level different from baseline mobility prior to current illness? -- Yes - Recommend PT order Yes - Recommend PT order Yes - Recommend PT order             Barriers to discharge: none Disposition Plan:  Home HH orders placed: n/a Status is: Inpt  Objective: Blood pressure (!) 130/91, pulse (!) 102, temperature 98.8 F (37.1 C), temperature source Oral, resp. rate 18, weight 89.8 kg, SpO2 92%.  Examination:  Physical Exam Constitutional:      General: He is not in acute distress.    Appearance:  Normal appearance.  HENT:     Head: Normocephalic and atraumatic.     Mouth/Throat:     Mouth: Mucous membranes are moist.  Eyes:     Extraocular Movements: Extraocular movements intact.  Cardiovascular:     Rate and Rhythm: Normal rate and regular rhythm.  Pulmonary:     Effort: Pulmonary effort is normal. No respiratory distress.     Breath sounds: Normal breath sounds. No wheezing.  Abdominal:     General: Bowel sounds are  normal. There is no distension.     Palpations: Abdomen is soft.     Tenderness: There is no abdominal tenderness.     Hernia: A hernia is present. Hernia is present in the right inguinal area (nonreducible and TTP).  Musculoskeletal:        General: Normal range of motion.     Cervical back: Normal range of motion and neck supple.  Skin:    General: Skin is warm and dry.  Neurological:     General: No focal deficit present.     Mental Status: He is alert.  Psychiatric:        Mood and Affect: Mood normal.        Behavior: Behavior normal.      Consultants:  General surgery  Procedures:  07/09/2023: Procedure: Open right inguinal hernia repair with mesh Diagnostic laparoscopy  Data Reviewed: Results for orders placed or performed during the hospital encounter of 07/08/23 (from the past 24 hours)  Glucose, capillary     Status: Abnormal   Collection Time: 07/10/23  4:02 PM  Result Value Ref Range   Glucose-Capillary 211 (H) 70 - 99 mg/dL  Glucose, capillary     Status: Abnormal   Collection Time: 07/10/23  8:28 PM  Result Value Ref Range   Glucose-Capillary 202 (H) 70 - 99 mg/dL  Glucose, capillary     Status: Abnormal   Collection Time: 07/10/23 11:48 PM  Result Value Ref Range   Glucose-Capillary 160 (H) 70 - 99 mg/dL  Glucose, capillary     Status: Abnormal   Collection Time: 07/11/23  3:55 AM  Result Value Ref Range   Glucose-Capillary 151 (H) 70 - 99 mg/dL  Basic metabolic panel     Status: Abnormal   Collection Time: 07/11/23  4:04 AM  Result Value Ref Range   Sodium 132 (L) 135 - 145 mmol/L   Potassium 3.6 3.5 - 5.1 mmol/L   Chloride 98 98 - 111 mmol/L   CO2 26 22 - 32 mmol/L   Glucose, Bld 144 (H) 70 - 99 mg/dL   BUN 11 8 - 23 mg/dL   Creatinine, Ser 1.61 (L) 0.61 - 1.24 mg/dL   Calcium 7.8 (L) 8.9 - 10.3 mg/dL   GFR, Estimated >09 >60 mL/min   Anion gap 8 5 - 15  CBC with Differential/Platelet     Status: Abnormal   Collection Time: 07/11/23  4:04 AM   Result Value Ref Range   WBC 7.4 4.0 - 10.5 K/uL   RBC 4.08 (L) 4.22 - 5.81 MIL/uL   Hemoglobin 13.4 13.0 - 17.0 g/dL   HCT 45.4 (L) 09.8 - 11.9 %   MCV 94.6 80.0 - 100.0 fL   MCH 32.8 26.0 - 34.0 pg   MCHC 34.7 30.0 - 36.0 g/dL   RDW 14.7 82.9 - 56.2 %   Platelets 182 150 - 400 K/uL   nRBC 0.0 0.0 - 0.2 %   Neutrophils Relative % 64 %  Neutro Abs 4.6 1.7 - 7.7 K/uL   Lymphocytes Relative 21 %   Lymphs Abs 1.6 0.7 - 4.0 K/uL   Monocytes Relative 15 %   Monocytes Absolute 1.1 (H) 0.1 - 1.0 K/uL   Eosinophils Relative 0 %   Eosinophils Absolute 0.0 0.0 - 0.5 K/uL   Basophils Relative 0 %   Basophils Absolute 0.0 0.0 - 0.1 K/uL   Immature Granulocytes 0 %   Abs Immature Granulocytes 0.03 0.00 - 0.07 K/uL  Magnesium     Status: None   Collection Time: 07/11/23  4:04 AM  Result Value Ref Range   Magnesium 2.0 1.7 - 2.4 mg/dL  Glucose, capillary     Status: Abnormal   Collection Time: 07/11/23  7:36 AM  Result Value Ref Range   Glucose-Capillary 135 (H) 70 - 99 mg/dL  Glucose, capillary     Status: Abnormal   Collection Time: 07/11/23 11:55 AM  Result Value Ref Range   Glucose-Capillary 128 (H) 70 - 99 mg/dL    I have reviewed pertinent nursing notes, vitals, labs, and images as necessary. I have ordered labwork to follow up on as indicated.  I have reviewed the last notes from staff over past 24 hours. I have discussed patient's care plan and test results with nursing staff, CM/SW, and other staff as appropriate.    LOS: 2 days   Lewie Chamber, MD [facility] 07/11/2023, 12:03 PM

## 2023-07-11 NOTE — Progress Notes (Signed)
Pt attempted to get up and walk. Pt desaturates to below 88% on room air. Maintained oxygen at 2L per nasal canula. Will consult PT.

## 2023-07-11 NOTE — Progress Notes (Signed)
   07/11/23 1306  TOC Brief Assessment  Insurance and Status Reviewed  Patient has primary care physician Yes  Home environment has been reviewed yes  Prior level of function: independent  Prior/Current Home Services No current home services  Social Drivers of Health Review SDOH reviewed no interventions necessary  Readmission risk has been reviewed Yes  Transition of care needs no transition of care needs at this time

## 2023-07-11 NOTE — Progress Notes (Signed)
    2 Days Post-Op  Subjective: No n/v/abdominal pain or bloating with CLD. Passing flatus. No BM. Pain controlled. Has been ambulating. Has some SHOB and on supp O2   Objective: Vital signs in last 24 hours: Temp:  [98.8 F (37.1 C)-99.6 F (37.6 C)] 98.8 F (37.1 C) (01/20 0358) Pulse Rate:  [102-113] 102 (01/20 0358) Resp:  [18-20] 18 (01/20 0358) BP: (119-145)/(76-91) 130/91 (01/20 0358) SpO2:  [87 %-96 %] 92 % (01/20 0812) Last BM Date : 07/08/23  Intake/Output from previous day: 01/19 0701 - 01/20 0700 In: 1914.1 [P.O.:1160; I.V.:754.1] Out: 800 [Urine:800] Intake/Output this shift: No intake/output data recorded.  PE: Gen: male, NAD Abd: soft, non-distended, port sites clean and dry Groin: incision clean and dry with dermabond intact, + swelling below incision  Lab Results:  Recent Labs    07/10/23 0525 07/11/23 0404  WBC 8.7 7.4  HGB 14.1 13.4  HCT 42.0 38.6*  PLT 187 182   BMET Recent Labs    07/10/23 0525 07/11/23 0404  NA 132* 132*  K 3.9 3.6  CL 99 98  CO2 21* 26  GLUCOSE 126* 144*  BUN 16 11  CREATININE 0.76 0.59*  CALCIUM 8.1* 7.8*   PT/INR Recent Labs    07/09/23 0415  LABPROT 14.9  INR 1.2   CMP     Component Value Date/Time   NA 132 (L) 07/11/2023 0404   K 3.6 07/11/2023 0404   CL 98 07/11/2023 0404   CO2 26 07/11/2023 0404   GLUCOSE 144 (H) 07/11/2023 0404   BUN 11 07/11/2023 0404   CREATININE 0.59 (L) 07/11/2023 0404   CREATININE 0.93 07/22/2022 1628   CALCIUM 7.8 (L) 07/11/2023 0404   PROT 6.8 07/09/2023 0415   ALBUMIN 3.1 (L) 07/09/2023 0415   AST 14 (L) 07/09/2023 0415   ALT 14 07/09/2023 0415   ALKPHOS 89 07/09/2023 0415   BILITOT 0.5 07/09/2023 0415   GFRNONAA >60 07/11/2023 0404   GFRAA >60 04/12/2019 1349   Lipase     Component Value Date/Time   LIPASE 35 07/06/2023 1954    Studies/Results: No results found.   Anti-infectives: Anti-infectives (From admission, onward)    Start     Dose/Rate Route  Frequency Ordered Stop   07/09/23 1345  ceFAZolin (ANCEF) IVPB 2g/100 mL premix        2 g 200 mL/hr over 30 Minutes Intravenous  Once 07/09/23 1331 07/09/23 1400   07/09/23 1326  ceFAZolin (ANCEF) 2-4 GM/100ML-% IVPB       Note to Pharmacy: Raelyn Number J: cabinet override      07/09/23 1326 07/09/23 1355       Assessment/Plan 63 y/o M who presented with an incarcerated right inguinal hernia and is now s/p open right inguinal hernia repair with mesh and diagnostic laparoscopy (1/18, Metzger) POD2 - +flatus. Tolerating CLD. Advance to FLD. Advance as tolerated - ice prn to inguinal incision - start colace bid - OOB w/ ambulation  - Home when tolerating PO and having bowel function   FEN: FLD ID: ancef periop VTE: heparin subcu   LOS: 2 days   [facility] 07/11/2023, 8:25 AM Please see Amion for pager number during day hours 7:00am-4:30pm or 7:00am -11:30am on weekends

## 2023-07-12 DIAGNOSIS — K4031 Unilateral inguinal hernia, with obstruction, without gangrene, recurrent: Secondary | ICD-10-CM | POA: Diagnosis not present

## 2023-07-12 DIAGNOSIS — K56609 Unspecified intestinal obstruction, unspecified as to partial versus complete obstruction: Secondary | ICD-10-CM | POA: Diagnosis not present

## 2023-07-12 LAB — GLUCOSE, CAPILLARY
Glucose-Capillary: 134 mg/dL — ABNORMAL HIGH (ref 70–99)
Glucose-Capillary: 138 mg/dL — ABNORMAL HIGH (ref 70–99)
Glucose-Capillary: 152 mg/dL — ABNORMAL HIGH (ref 70–99)
Glucose-Capillary: 155 mg/dL — ABNORMAL HIGH (ref 70–99)
Glucose-Capillary: 169 mg/dL — ABNORMAL HIGH (ref 70–99)
Glucose-Capillary: 231 mg/dL — ABNORMAL HIGH (ref 70–99)

## 2023-07-12 LAB — SURGICAL PATHOLOGY

## 2023-07-12 MED ORDER — METHOCARBAMOL 500 MG PO TABS
500.0000 mg | ORAL_TABLET | Freq: Three times a day (TID) | ORAL | Status: DC
  Administered 2023-07-12 (×3): 500 mg via ORAL
  Filled 2023-07-12 (×3): qty 1

## 2023-07-12 MED ORDER — INSULIN ASPART 100 UNIT/ML IJ SOLN
0.0000 [IU] | Freq: Three times a day (TID) | INTRAMUSCULAR | Status: DC
Start: 2023-07-12 — End: 2023-07-13
  Administered 2023-07-12: 3 [IU] via SUBCUTANEOUS
  Administered 2023-07-12 – 2023-07-13 (×4): 2 [IU] via SUBCUTANEOUS

## 2023-07-12 MED ORDER — ACETAMINOPHEN 325 MG PO TABS
650.0000 mg | ORAL_TABLET | Freq: Four times a day (QID) | ORAL | Status: DC
  Administered 2023-07-12 – 2023-07-13 (×5): 650 mg via ORAL
  Filled 2023-07-12 (×5): qty 2

## 2023-07-12 NOTE — Progress Notes (Signed)
Progress Note    Sean Martinez   RUE:[id number]  DOB: May 07, 1961  DOA: 07/08/2023     3 PCP: Garnette Gunner, MD  Initial CC: R inguinal hernia, abd pain  Hospital Course: Mr. Ulery is a 63 yo male with PMH left inguinal hernia (repaired in teenage years), CAD, DMII, asthma who presented with ongoing right inguinal hernia.  Typically he is able to reduce it but states it became unreducible and exquisitely painful.  He also developed nausea and vomiting. CT abdomen/pelvis showed incarcerated right inguinal hernia with distal small bowel obstruction.  He underwent NG tube placement and was admitted for surgical evaluation.  Interval History:  Overall progressing well.  Concerned about some scrotal swelling this morning but nontender and nonpainful.  Had small bowel movement since yesterday and still having flatus.  Denies nausea or vomiting.  Has been tolerating diet advancement.  Assessment and Plan: * SBO (small bowel obstruction) (HCC) - due to incarcerated right inguinal hernia -Confirmed on CT abdomen/pelvis on admission as well - General surgery following, appreciate assistance - underwent open right inguinal hernia repair with mesh and diagnostic laparoscopy on 07/09/2023 - NG tube removed per surgery on 07/10/2023 -Has been tolerating diet advancement by surgery.  Advanced to regular diet on 1/21 - Endorses a very small bowel movement overnight.  Still having flatus - continue ambulating - suspect d/c Wednesday if still progressing well   Inguinal hernia-resolved as of 07/10/2023 - see SBO  Hyponatremia - mild, asymptomatic  - s/p IVF  Essential hypertension - BP improved - PRN meds for now  Non-insulin dependent type 2 diabetes mellitus (HCC) - SSI and CBG  Hyperlipidemia - Crestor on hold for now  Continuous dependence on cigarette smoking - nicotine patch as needed  COPD (chronic obstructive pulmonary disease) (HCC) - no s/s exac   Old records  reviewed in assessment of this patient  Antimicrobials:   DVT prophylaxis:  heparin injection 5,000 Units Start: 07/09/23 0600 SCDs Start: 07/09/23 0315 Place TED hose Start: 07/09/23 0315   Code Status:   Code Status: Full Code  Mobility Assessment (Last 72 Hours)     Mobility Assessment     Row Name 07/12/23 0745 07/11/23 2210 07/11/23 0900 07/10/23 2000 07/10/23 0716   Does patient have an order for bedrest or is patient medically unstable No - Continue assessment No - Continue assessment No - Continue assessment No - Continue assessment No - Continue assessment   What is the highest level of mobility based on the progressive mobility assessment? Level 5 (Walks with assist in room/hall) - Balance while stepping forward/back and can walk in room with assist - Complete Level 5 (Walks with assist in room/hall) - Balance while stepping forward/back and can walk in room with assist - Complete Level 5 (Walks with assist in room/hall) - Balance while stepping forward/back and can walk in room with assist - Complete Level 3 (Stands with assist) - Balance while standing  and cannot march in place Level 3 (Stands with assist) - Balance while standing  and cannot march in place   Is the above level different from baseline mobility prior to current illness? Yes - Recommend PT order Yes - Recommend PT order -- Yes - Recommend PT order Yes - Recommend PT order    Row Name 07/09/23 1945           Does patient have an order for bedrest or is patient medically unstable No - Continue assessment  What is the highest level of mobility based on the progressive mobility assessment? Level 2 (Chairfast) - Balance while sitting on edge of bed and cannot stand       Is the above level different from baseline mobility prior to current illness? Yes - Recommend PT order                Barriers to discharge: none Disposition Plan:  Home HH orders placed: n/a Status is: Inpt  Objective: Blood  pressure 127/81, pulse 99, temperature 98.5 F (36.9 C), temperature source Oral, resp. rate 18, weight 89.8 kg, SpO2 91%.  Examination:  Physical Exam Constitutional:      General: He is not in acute distress.    Appearance: Normal appearance.  HENT:     Head: Normocephalic and atraumatic.     Mouth/Throat:     Mouth: Mucous membranes are moist.  Eyes:     Extraocular Movements: Extraocular movements intact.  Cardiovascular:     Rate and Rhythm: Normal rate and regular rhythm.  Pulmonary:     Effort: Pulmonary effort is normal. No respiratory distress.     Breath sounds: Normal breath sounds. No wheezing.  Abdominal:     General: Bowel sounds are normal. There is no distension.     Palpations: Abdomen is soft.     Tenderness: There is no abdominal tenderness.     Hernia: Right inguinal: now resolved.  Genitourinary:    Comments: Penile and scrotal swelling appreciated Musculoskeletal:        General: Normal range of motion.     Cervical back: Normal range of motion and neck supple.  Skin:    General: Skin is warm and dry.  Neurological:     General: No focal deficit present.     Mental Status: He is alert.  Psychiatric:        Mood and Affect: Mood normal.        Behavior: Behavior normal.      Consultants:  General surgery  Procedures:  07/09/2023: Procedure: Open right inguinal hernia repair with mesh Diagnostic laparoscopy  Data Reviewed: Results for orders placed or performed during the hospital encounter of 07/08/23 (from the past 24 hours)  Glucose, capillary     Status: Abnormal   Collection Time: 07/11/23 11:55 AM  Result Value Ref Range   Glucose-Capillary 128 (H) 70 - 99 mg/dL  Glucose, capillary     Status: Abnormal   Collection Time: 07/11/23  4:04 PM  Result Value Ref Range   Glucose-Capillary 195 (H) 70 - 99 mg/dL  Glucose, capillary     Status: Abnormal   Collection Time: 07/11/23  8:24 PM  Result Value Ref Range   Glucose-Capillary 213 (H)  70 - 99 mg/dL  Glucose, capillary     Status: Abnormal   Collection Time: 07/12/23 12:12 AM  Result Value Ref Range   Glucose-Capillary 155 (H) 70 - 99 mg/dL  Glucose, capillary     Status: Abnormal   Collection Time: 07/12/23  4:13 AM  Result Value Ref Range   Glucose-Capillary 138 (H) 70 - 99 mg/dL  Glucose, capillary     Status: Abnormal   Collection Time: 07/12/23  7:25 AM  Result Value Ref Range   Glucose-Capillary 134 (H) 70 - 99 mg/dL    I have reviewed pertinent nursing notes, vitals, labs, and images as necessary. I have ordered labwork to follow up on as indicated.  I have reviewed the last notes from staff over past  24 hours. I have discussed patient's care plan and test results with nursing staff, CM/SW, and other staff as appropriate.    LOS: 3 days   Lewie Chamber, MD [facility] 07/12/2023, 11:07 AM

## 2023-07-12 NOTE — Progress Notes (Signed)
Mobility Specialist - Progress Note   07/12/23 1116  Mobility  Activity Ambulated with assistance in hallway;Ambulated with assistance to bathroom  Level of Assistance Modified independent, requires aide device or extra time  Assistive Device Front wheel walker  Distance Ambulated (ft) 500 ft  Activity Response Tolerated well  Mobility Referral Yes  Mobility visit 1 Mobility  Mobility Specialist Start Time (ACUTE ONLY) 1051  Mobility Specialist Stop Time (ACUTE ONLY) 1115  Mobility Specialist Time Calculation (min) (ACUTE ONLY) 24 min   Pt received in bed and agreeable to mobility. Prior to ambulating, pt requested assistance to the bathroom. No complaints during session. Pt had x1 coughing spell during ambulation. Pt to recliner after session with all needs met.   [facility]

## 2023-07-12 NOTE — Progress Notes (Signed)
    3 Days Post-Op  Subjective: No n/v/abdominal pain or bloating with FLD and passing flatus. Patient reports some pain but overall well controlled and is mobilizing well. Penile/scrotal swelling present but UOP has been good. He plans to discharge home with his mother and reports it would be better for her to plan discharge tomorrow if doing well with diet advancement and pain control.    Objective: Vital signs in last 24 hours: Temp:  [98.5 F (36.9 C)-98.6 F (37 C)] 98.5 F (36.9 C) (01/21 0416) Pulse Rate:  [94-107] 99 (01/21 0811) Resp:  [18] 18 (01/21 0416) BP: (127-150)/(81-99) 127/81 (01/21 0811) SpO2:  [89 %-94 %] 91 % (01/21 0811) Last BM Date : 07/11/23  Intake/Output from previous day: 01/20 0701 - 01/21 0700 In: 1200 [P.O.:1200] Out: 1130 [Urine:1130] Intake/Output this shift: Total I/O In: 120 [P.O.:120] Out: 200 [Urine:200]  PE: Gen: male, NAD Abd: soft, non-distended, port sites clean and dry Groin: incision clean and dry with dermabond intact, + swelling below incision  Lab Results:  Recent Labs    07/10/23 0525 07/11/23 0404  WBC 8.7 7.4  HGB 14.1 13.4  HCT 42.0 38.6*  PLT 187 182   BMET Recent Labs    07/10/23 0525 07/11/23 0404  NA 132* 132*  K 3.9 3.6  CL 99 98  CO2 21* 26  GLUCOSE 126* 144*  BUN 16 11  CREATININE 0.76 0.59*  CALCIUM 8.1* 7.8*   PT/INR No results for input(s): "LABPROT", "INR" in the last 72 hours.  CMP     Component Value Date/Time   NA 132 (L) 07/11/2023 0404   K 3.6 07/11/2023 0404   CL 98 07/11/2023 0404   CO2 26 07/11/2023 0404   GLUCOSE 144 (H) 07/11/2023 0404   BUN 11 07/11/2023 0404   CREATININE 0.59 (L) 07/11/2023 0404   CREATININE 0.93 07/22/2022 1628   CALCIUM 7.8 (L) 07/11/2023 0404   PROT 6.8 07/09/2023 0415   ALBUMIN 3.1 (L) 07/09/2023 0415   AST 14 (L) 07/09/2023 0415   ALT 14 07/09/2023 0415   ALKPHOS 89 07/09/2023 0415   BILITOT 0.5 07/09/2023 0415   GFRNONAA >60 07/11/2023 0404    GFRAA >60 04/12/2019 1349   Lipase     Component Value Date/Time   LIPASE 35 07/06/2023 1954    Studies/Results: No results found.   Anti-infectives: Anti-infectives (From admission, onward)    Start     Dose/Rate Route Frequency Ordered Stop   07/09/23 1345  ceFAZolin (ANCEF) IVPB 2g/100 mL premix        2 g 200 mL/hr over 30 Minutes Intravenous  Once 07/09/23 1331 07/09/23 1400   07/09/23 1326  ceFAZolin (ANCEF) 2-4 GM/100ML-% IVPB       Note to Pharmacy: Raelyn Number J: cabinet override      07/09/23 1326 07/09/23 1355       Assessment/Plan 63 y/o M who presented with an incarcerated right inguinal hernia and is now s/p open right inguinal hernia repair with mesh and diagnostic laparoscopy (1/18, Metzger) POD3 - +flatus. Advance to regular diet  - ice prn to inguinal incision - OOB w/ ambulation  - Home when tolerating PO and having bowel function   FEN: regular diet  ID: ancef periop VTE: heparin subcu   LOS: 3 days   [facility] 07/12/2023, 10:30 AM Please see Amion for pager number during day hours 7:00am-4:30pm or 7:00am -11:30am on weekends

## 2023-07-12 NOTE — Discharge Instructions (Signed)
[facility] _______[facility]  UMBILICAL OR INGUINAL HERNIA REPAIR: POST OP INSTRUCTIONS  Always review your discharge instruction sheet given to you by the facility where your surgery was performed. IF YOU HAVE DISABILITY OR FAMILY LEAVE FORMS, YOU MUST BRING THEM TO THE OFFICE FOR PROCESSING.   DO NOT GIVE THEM TO YOUR DOCTOR.  1. A  prescription for pain medication may be given to you upon discharge.  Take your pain medication as prescribed, if needed.  If narcotic pain medicine is not needed, then you may take acetaminophen (Tylenol) or ibuprofen (Advil) as needed. 2. Take your usually prescribed medications unless otherwise directed. If you need a refill on your pain medication, please contact your pharmacy.  They will contact our office to request authorization. Prescriptions will not be filled after 5 pm or on week-ends. 3. You should follow a light diet the first 24 hours after arrival home, such as soup and crackers, etc.  Be sure to include lots of fluids daily.  Resume your normal diet the day after surgery. 4.Most patients will experience some swelling and bruising around the umbilicus or in the groin and scrotum.  Ice packs and reclining will help.  Swelling and bruising can take several days to resolve.  6. It is common to experience some constipation if taking pain medication after surgery.  Increasing fluid intake and taking a stool softener (such as Colace) will usually help or prevent this problem from occurring.  A mild laxative (Milk of Magnesia or Miralax) should be taken according to package directions if there are no bowel movements after 48 hours. 7. Unless discharge instructions indicate otherwise, you may remove your bandages 24-48 hours after surgery, and you may shower at that time.  You may have steri-strips (small skin tapes) in place directly over the incision.  These strips should be left on the skin for 7-10 days.  If your surgeon used skin glue on the  incision, you may shower in 24 hours.  The glue will flake off over the next 2-3 weeks.  Any sutures or staples will be removed at the office during your follow-up visit. 8. ACTIVITIES:  You may resume regular (light) daily activities beginning the next day--such as daily self-care, walking, climbing stairs--gradually increasing activities as tolerated.  You may have sexual intercourse when it is comfortable.  Refrain from any heavy lifting or straining until approved by your doctor.  a.You may drive when you are no longer taking prescription pain medication, you can comfortably wear a seatbelt, and you can safely maneuver your car and apply brakes.   9.You should see your doctor in the office for a follow-up appointment approximately 2-3 weeks after your surgery.  Make sure that you call for this appointment within a day or two after you arrive home to insure a convenient appointment time.   WHEN TO CALL YOUR DOCTOR: Fever over 101.0 Inability to urinate Nausea and/or vomiting Extreme swelling or bruising Continued bleeding from incision. Increased pain, redness, or drainage from the incision  The clinic staff is available to answer your questions during regular business hours.  Please don't hesitate to call and ask to speak to one of the nurses for clinical concerns.  If you have a medical emergency, go to the nearest emergency room or call 911.  A surgeon from [facility] is always on call at the hospital   [address], [address], [place], [place]  [address] ?  [address] [address], [place], [place]     [address] [phone] ? [phone] ? FAX [phone] Web site: [email or web address]  

## 2023-07-13 ENCOUNTER — Telehealth: Payer: Self-pay

## 2023-07-13 DIAGNOSIS — K56609 Unspecified intestinal obstruction, unspecified as to partial versus complete obstruction: Secondary | ICD-10-CM | POA: Diagnosis not present

## 2023-07-13 LAB — GLUCOSE, CAPILLARY
Glucose-Capillary: 155 mg/dL — ABNORMAL HIGH (ref 70–99)
Glucose-Capillary: 171 mg/dL — ABNORMAL HIGH (ref 70–99)

## 2023-07-13 MED ORDER — METHOCARBAMOL 1000 MG PO TABS: 1000.0000 mg | ORAL_TABLET | Freq: Three times a day (TID) | ORAL | 0 refills | Status: DC

## 2023-07-13 MED ORDER — POLYETHYLENE GLYCOL 3350 17 G PO PACK
17.0000 g | PACK | Freq: Every day | ORAL | Status: DC
Start: 2023-07-13 — End: 2023-07-13
  Administered 2023-07-13: 17 g via ORAL
  Filled 2023-07-13: qty 1

## 2023-07-13 MED ORDER — DOCUSATE SODIUM 100 MG PO CAPS: 100.0000 mg | ORAL_CAPSULE | Freq: Every day | ORAL | Status: DC | PRN

## 2023-07-13 MED ORDER — ACETAMINOPHEN 325 MG PO TABS: 650.0000 mg | ORAL_TABLET | Freq: Four times a day (QID) | ORAL | Status: AC | PRN

## 2023-07-13 MED ORDER — OXYCODONE HCL 5 MG PO TABS: 5.0000 mg | ORAL_TABLET | ORAL | 0 refills | Status: DC | PRN

## 2023-07-13 MED ORDER — METHOCARBAMOL 500 MG PO TABS
1000.0000 mg | ORAL_TABLET | Freq: Three times a day (TID) | ORAL | Status: DC
  Administered 2023-07-13: 1000 mg via ORAL
  Filled 2023-07-13: qty 2

## 2023-07-13 MED ORDER — POLYETHYLENE GLYCOL 3350 17 G PO PACK: 17.0000 g | PACK | Freq: Every day | ORAL | Status: DC | PRN

## 2023-07-13 MED ORDER — OXYCODONE HCL 5 MG PO TABS
5.0000 mg | ORAL_TABLET | ORAL | Status: DC | PRN
  Administered 2023-07-13: 5 mg via ORAL
  Filled 2023-07-13: qty 1

## 2023-07-13 NOTE — Progress Notes (Signed)
    4 Days Post-Op  Subjective: May have overeaten some yesterday being on solid food. Still passing some flatus, last BM Monday. Good pain relief with 5 mg oxycodone as needed. Mobilizing well. He reports he works at a [employer] but does not need to lift anything.     Objective: Vital signs in last 24 hours: Temp:  [98.4 F (36.9 C)-98.9 F (37.2 C)] 98.4 F (36.9 C) (01/22 0528) Pulse Rate:  [88-93] 93 (01/22 0528) Resp:  [15-18] 15 (01/22 0528) BP: (129-150)/(77-93) 150/93 (01/22 0528) SpO2:  [91 %-92 %] 91 % (01/22 0528) Last BM Date : 07/11/23  Intake/Output from previous day: 01/21 0701 - 01/22 0700 In: 1380 [P.O.:1380] Out: 400 [Urine:400] Intake/Output this shift: No intake/output data recorded.  PE: Gen: male, NAD Abd: soft, non-distended, port sites clean and dry Groin: incision clean and dry with dermabond intact, + swelling below incision  Lab Results:  Recent Labs    07/11/23 0404  WBC 7.4  HGB 13.4  HCT 38.6*  PLT 182   BMET Recent Labs    07/11/23 0404  NA 132*  K 3.6  CL 98  CO2 26  GLUCOSE 144*  BUN 11  CREATININE 0.59*  CALCIUM 7.8*   PT/INR No results for input(s): "LABPROT", "INR" in the last 72 hours.  CMP     Component Value Date/Time   NA 132 (L) 07/11/2023 0404   K 3.6 07/11/2023 0404   CL 98 07/11/2023 0404   CO2 26 07/11/2023 0404   GLUCOSE 144 (H) 07/11/2023 0404   BUN 11 07/11/2023 0404   CREATININE 0.59 (L) 07/11/2023 0404   CREATININE 0.93 07/22/2022 1628   CALCIUM 7.8 (L) 07/11/2023 0404   PROT 6.8 07/09/2023 0415   ALBUMIN 3.1 (L) 07/09/2023 0415   AST 14 (L) 07/09/2023 0415   ALT 14 07/09/2023 0415   ALKPHOS 89 07/09/2023 0415   BILITOT 0.5 07/09/2023 0415   GFRNONAA >60 07/11/2023 0404   GFRAA >60 04/12/2019 1349   Lipase     Component Value Date/Time   LIPASE 35 07/06/2023 1954    Studies/Results: No results found.   Anti-infectives: Anti-infectives (From admission, onward)    Start      Dose/Rate Route Frequency Ordered Stop   07/09/23 1345  ceFAZolin (ANCEF) IVPB 2g/100 mL premix        2 g 200 mL/hr over 30 Minutes Intravenous  Once 07/09/23 1331 07/09/23 1400   07/09/23 1326  ceFAZolin (ANCEF) 2-4 GM/100ML-% IVPB       Note to Pharmacy: Raelyn Number J: cabinet override      07/09/23 1326 07/09/23 1355       Assessment/Plan 63 y/o M who presented with an incarcerated right inguinal hernia and is now s/p open right inguinal hernia repair with mesh and diagnostic laparoscopy (1/18, Hillery Hunter) POD4 - tol regular diet, add miralax today for bowel regimen - ice prn to inguinal incision - OOB w/ ambulation  - stable for DC from surgery standpoint, follow up in AVS   FEN: regular diet  ID: ancef periop VTE: heparin subcu   LOS: 4 days   [facility] 07/13/2023, 9:05 AM Please see Amion for pager number during day hours 7:00am-4:30pm or 7:00am -11:30am on weekends

## 2023-07-13 NOTE — Telephone Encounter (Signed)
Please advise, see below.   

## 2023-07-13 NOTE — Telephone Encounter (Signed)
Copied from CRM [id number]. Topic: Clinical - Medical Advice >> Jul 13, 2023  4:08 PM Elizebeth Brooking wrote: Reason for CRM: Patient called in stating he is suppose to go back to work, but its before his appointment on the 3rd with his pcp. Is wondering does he need to be cleared before he is able to go back to work, is requesting a callback on this matter

## 2023-07-13 NOTE — Discharge Summary (Signed)
Physician Discharge Summary  Sean Martinez JXB:[id number] DOB: [birth year] DOA: 07/08/2023  PCP: Garnette Gunner, MD  Admit date: 07/08/2023 Discharge date: 07/13/2023  Admitted From: Home Disposition: Home  Recommendations for Outpatient Follow-up:  Follow up with PCP in 1-2 weeks Follow-up with surgery team per recommendations  Home Health: None Equipment/Devices: None  Discharge Condition: Stable CODE STATUS: Full Diet recommendation: Soft regular diet, advance as tolerated  Brief/Interim Summary: Sean Martinez is a 63 yo male with PMH left inguinal hernia (repaired in teenage years), CAD, DMII, asthma who presented with ongoing right inguinal hernia  -found to have incarcerated right inguinal hernia with distal small bowel obstruction.  Status post right inguinal hernia repair and mesh 07/08/2022.  Bowel function returned, tolerating p.o. well, pain currently well-controlled otherwise stable and agreeable for discharge home.  Discharge Diagnoses:  Principal Problem:   SBO (small bowel obstruction) (HCC) Active Problems:   Essential hypertension   Hyponatremia   Non-insulin dependent type 2 diabetes mellitus (HCC)   COPD (chronic obstructive pulmonary disease) (HCC)   Continuous dependence on cigarette smoking   Hyperlipidemia  SBO (small bowel obstruction) (HCC) Secondary to incarcerated inguinal hernia - due to incarcerated right inguinal hernia - underwent open right inguinal hernia repair with mesh and diagnostic laparoscopy on 07/09/2023 -Tolerating p.o. well, bowel movement and flatus passed without issue   Hyponatremia -Likely hypovolemic, corrected with IV fluids   Essential hypertension -Continue to follow   Non-insulin dependent type 2 diabetes mellitus (HCC) -Resume home meds   Hyperlipidemia - Resume home med's, low-fat diet recommended   Continuous dependence on cigarette smoking - nicotine patch as needed   COPD (chronic obstructive pulmonary  disease) (HCC) - no s/s exac  Discharge Instructions  Discharge Instructions     Call MD for:  difficulty breathing, headache or visual disturbances   Complete by: As directed    Call MD for:  extreme fatigue   Complete by: As directed    Call MD for:  hives   Complete by: As directed    Call MD for:  persistant dizziness or light-headedness   Complete by: As directed    Call MD for:  persistant nausea and vomiting   Complete by: As directed    Call MD for:  redness, tenderness, or signs of infection (pain, swelling, redness, odor or green/yellow discharge around incision site)   Complete by: As directed    Call MD for:  severe uncontrolled pain   Complete by: As directed    Call MD for:  temperature >100.4   Complete by: As directed    Diet - low sodium heart healthy   Complete by: As directed    Discharge instructions   Complete by: As directed    Follow up with Surgery as scheduled   Increase activity slowly   Complete by: As directed       Allergies as of 07/13/2023       Reactions   Penicillins Hives   Has patient had a PCN reaction causing immediate rash, facial/tongue/throat swelling, SOB or lightheadedness with hypotension: Yes Has patient had a PCN reaction causing severe rash involving mucus membranes or skin necrosis: No Has patient had a PCN reaction that required hospitalization: No Has patient had a PCN reaction occurring within the last 10 years: No If all of the above answers are "NO", then may proceed with Cephalosporin use. Received Ancef for surgery on 07/09/23 with no issues.        Medication List  STOP taking these medications    ipratropium-albuterol 0.5-2.5 (3) MG/3ML Soln Commonly known as: DUONEB   Nicotine 10 MG/ML Soln   traMADol 50 MG tablet Commonly known as: ULTRAM       TAKE these medications    acetaminophen 325 MG tablet Commonly known as: TYLENOL Take 2 tablets (650 mg total) by mouth every 6 (six) hours as needed  for mild pain (pain score 1-3) or fever. What changed:  medication strength how much to take reasons to take this   albuterol 108 (90 Base) MCG/ACT inhaler Commonly known as: VENTOLIN HFA INHALE 2 PUFFS BY MOUTH EVERY 4 HOURS AS NEEDED FOR WHEEZING FOR SHORTNESS OF BREATH What changed: See the new instructions.   Anoro Ellipta 62.5-25 MCG/ACT Aepb Generic drug: umeclidinium-vilanterol INHALE 1 PUFF BY MOUTH ONCE DAILY AT  6:00 AM   Blood Glucose Monitoring Suppl Devi 1 each by Does not apply route in the morning, at noon, and at bedtime. May substitute to any manufacturer covered by patient's insurance.   Colchicine 0.6 MG Caps At onset of flair, Take 1.2 mg (two tablets). Take 0.6 mg (one tablet) one hour later. Then, take 0.6 mg (one tablet) once daily for up to 7 days as needed for swelling and pain.   docusate sodium 100 MG capsule Commonly known as: COLACE Take 1 capsule (100 mg total) by mouth daily as needed for mild constipation.   indomethacin 50 MG capsule Commonly known as: INDOCIN Take 1 capsule (50 mg total) by mouth 3 (three) times daily as needed for moderate pain (pain score 4-6) (severe pain (8-10)).   metFORMIN 500 MG tablet Commonly known as: GLUCOPHAGE Take 2 tablets (1,000 mg total) by mouth 2 (two) times daily with a meal. What changed: how much to take   Methocarbamol 1000 MG Tabs Take 1,000 mg by mouth 3 (three) times daily.   oxyCODONE 5 MG immediate release tablet Commonly known as: Oxy IR/ROXICODONE Take 1-2 tablets (5-10 mg total) by mouth every 4 (four) hours as needed for moderate pain (pain score 4-6) or severe pain (pain score 7-10).   polyethylene glycol 17 g packet Commonly known as: MIRALAX / GLYCOLAX Take 17 g by mouth daily as needed for mild constipation.   rosuvastatin 5 MG tablet Commonly known as: Crestor Take 1 tablet (5 mg total) by mouth daily.        Follow-up Information     Moise Boring, MD. Go on 08/08/2023.    Specialty: General Surgery Why: 3:45 PM, please arrive 30 min prior to appointment time to check in. Have ID and insurance card/info with you. Contact information: [address] [place] [address] [phone]                Allergies  Allergen Reactions   Penicillins Hives    Has patient had a PCN reaction causing immediate rash, facial/tongue/throat swelling, SOB or lightheadedness with hypotension: Yes Has patient had a PCN reaction causing severe rash involving mucus membranes or skin necrosis: No Has patient had a PCN reaction that required hospitalization: No Has patient had a PCN reaction occurring within the last 10 years: No If all of the above answers are "NO", then may proceed with Cephalosporin use.  Received Ancef for surgery on 07/09/23 with no issues.    Consultations: General surgery  Procedures/Studies: DG Abd Portable 1 View Result Date: 07/09/2023 CLINICAL DATA:  NG tube placement EXAM: PORTABLE ABDOMEN - 1 VIEW COMPARISON:  CT abdomen  pelvis 07/09/2023. FINDINGS: Enteric tube courses below the hemidiaphragm with tip and side port overlying the expected region the gastric lumen. The bowel gas pattern is normal. No radio-opaque calculi or other significant radiographic abnormality are seen. IMPRESSION: Enteric tube in good position. Electronically Signed   By: Tish Frederickson M.D.   On: 07/09/2023 05:26   CT ABDOMEN PELVIS W CONTRAST Result Date: 07/09/2023 CLINICAL DATA:  Bowel obstruction EXAM: CT ABDOMEN AND PELVIS WITH CONTRAST TECHNIQUE: Multidetector CT imaging of the abdomen and pelvis was performed using the standard protocol following bolus administration of intravenous contrast. RADIATION DOSE REDUCTION: This exam was performed according to the departmental dose-optimization program which includes automated exposure control, adjustment of the mA and/or kV according to patient size and/or use of iterative reconstruction technique.  CONTRAST:  OMNIPAQUE IOHEXOL 300 MG/ML  SOLN COMPARISON:  None Available. FINDINGS: Lower chest: No acute abnormality. Hepatobiliary: Mild hepatic steatosis. No enhancing intrahepatic mass. No intra or extrahepatic biliary ductal dilation. Gallbladder unremarkable. Pancreas: Unremarkable Spleen: Unremarkable Adrenals/Urinary Tract: Adrenal glands are unremarkable. Kidneys are normal, without renal calculi, focal lesion, or hydronephrosis. Bladder is unremarkable. Stomach/Bowel: Indirect inguinal hernia is present with the cecum, ileocecal junction, and base of the appendix within the inguinal canal. There is resultant distal small-bowel obstruction with fecalized intraluminal contents within the terminal ileum. The colon is decompressed. No free intraperitoneal gas or fluid. Normal enhancement of the herniated bowel within the inguinal canal. Small right hydrocele is present. Moderate pancolonic diverticulosis without superimposed focal inflammatory change. The stomach, small bowel, large bowel, and appendix are otherwise unremarkable. Vascular/Lymphatic: No significant vascular findings are present. No enlarged abdominal or pelvic lymph nodes. Reproductive: Mild central prostatic hypertrophy. Other: Moderate right inguinal hernia, as above, containing a cecum and ileocecal junction. Musculoskeletal: No acute or significant osseous findings. IMPRESSION: 1. Moderate right inguinal hernia containing the cecum, ileocecal junction, and base of the appendix. Resultant distal small-bowel obstruction. No free intraperitoneal gas or fluid. 2. Mild hepatic steatosis. 3. Moderate pancolonic diverticulosis without superimposed focal inflammatory change. Electronically Signed   By: Helyn Numbers M.D.   On: 07/09/2023 02:18   CT ABDOMEN PELVIS W CONTRAST Result Date: 06/17/2023 CLINICAL DATA:  Abdominal pain. EXAM: CT ABDOMEN AND PELVIS WITH CONTRAST TECHNIQUE: Multidetector CT imaging of the abdomen and pelvis was  performed using the standard protocol following bolus administration of intravenous contrast. RADIATION DOSE REDUCTION: This exam was performed according to the departmental dose-optimization program which includes automated exposure control, adjustment of the mA and/or kV according to patient size and/or use of iterative reconstruction technique. CONTRAST:  OMNIPAQUE IOHEXOL 300 MG/ML  SOLN COMPARISON:  CT scan abdomen and pelvis from 05/31/2012. FINDINGS: Lower chest: The lung bases are clear. No pleural effusion. The heart is normal in size. No pericardial effusion. Hepatobiliary: The liver is normal in size. Non-cirrhotic configuration. No suspicious mass. These is mild diffuse hepatic steatosis. No intrahepatic or extrahepatic bile duct dilation. No calcified gallstones. Normal gallbladder wall thickness. No pericholecystic inflammatory changes. Pancreas: Unremarkable. No pancreatic ductal dilatation or surrounding inflammatory changes. Spleen: Within normal limits. No focal lesion. Adrenals/Urinary Tract: Adrenal glands are unremarkable. No suspicious renal mass. No hydronephrosis. No renal or ureteric calculi. Urinary bladder is partially distended precluding optimal assessment however, bladder wall trabeculations and several diverticula noted, suggesting sequela of chronic urinary outflow obstruction. Stomach/Bowel: No disproportionate dilation of the small or large bowel loops. No evidence of abnormal bowel wall thickening or inflammatory changes. The appendix is unremarkable. There are  multiple diverticula throughout the colon, without imaging signs of diverticulitis. Vascular/Lymphatic: No ascites or pneumoperitoneum. No abdominal or pelvic lymphadenopathy, by size criteria. No aneurysmal dilation of the major abdominal arteries. There are mild peripheral atherosclerotic vascular calcifications of the aorta and its major branches. Reproductive: Mildly enlarged prostate gland with median lobe  projecting into the bladder base. Other: There is a small-to-moderate right inguinal hernia containing portions of cecum, terminal ileum and appendix. No proximal bowel dilation. No abnormal wall thickening, fat stranding and fluid in the hernia sac. There are also a tiny fat containing umbilical hernia and fat containing left inguinal hernia. The soft tissues and abdominal wall are otherwise unremarkable. Musculoskeletal: No suspicious osseous lesions. There are mild multilevel degenerative changes in the visualized spine. IMPRESSION: *Uncomplicated right inguinal hernia containing portion of cecum, terminal ileum and appendix. *Multiple other nonacute observations, as described above. Electronically Signed   By: Jules Schick M.D.   On: 06/17/2023 14:20   ECHOCARDIOGRAM COMPLETE Result Date: 06/16/2023    ECHOCARDIOGRAM REPORT   Patient Name:   LAETON REI Date of Exam: 06/16/2023 Medical Rec #:  [id number]           Height:       72.0 in Accession #:    [id number]          Weight:       193.0 lb Date of Birth:  Jan 12, 1961          BSA:          2.099 m Patient Age:    62 years            BP:           124/80 mmHg Patient Gender: M                   HR:           85 bpm. Exam Location:  [address] Procedure: 2D Echo, 3D Echo, Cardiac Doppler, Color Doppler and Strain Analysis Indications:    Z01.810 Pre-Operative Evaluation  History:        Patient has prior history of Echocardiogram examinations. CAD                 and Previous Myocardial Infarction, COPD; Risk Factors:Current                 Smoker, Diabetes and Dyslipidemia. History of Substance Abuse,                 Pre-Operative Evaluations.  Sonographer:    Chanetta Marshall BA, RDCS Referring Phys: [phone] THOMAS A KELLY IMPRESSIONS  1. Left ventricular ejection fraction, by estimation, is 55 to 60%. The left ventricle has normal function. The left ventricle has no regional wall motion abnormalities. Left ventricular diastolic parameters were  normal. The average left ventricular global longitudinal strain is -17.9 %. The global longitudinal strain is normal.  2. Right ventricular systolic function is normal. The right ventricular size is normal. Tricuspid regurgitation signal is inadequate for assessing PA pressure.  3. The mitral valve is normal in structure. No evidence of mitral valve regurgitation. No evidence of mitral stenosis.  4. The aortic valve is tricuspid. Aortic valve regurgitation is not visualized. No aortic stenosis is present.  5. The inferior vena cava is normal in size with greater than 50% respiratory variability, suggesting right atrial pressure of 3 mmHg. Comparison(s): Changes from prior study are noted. EF normalized compared to study from 2013. Conclusion(s)/Recommendation(s): Normal  biventricular function without evidence of hemodynamically significant valvular heart disease. FINDINGS  Left Ventricle: Left ventricular ejection fraction, by estimation, is 55 to 60%. The left ventricle has normal function. The left ventricle has no regional wall motion abnormalities. The average left ventricular global longitudinal strain is -17.9 %. The global longitudinal strain is normal. The left ventricular internal cavity size was normal in size. There is no left ventricular hypertrophy. Left ventricular diastolic parameters were normal. Right Ventricle: The right ventricular size is normal. No increase in right ventricular wall thickness. Right ventricular systolic function is normal. Tricuspid regurgitation signal is inadequate for assessing PA pressure. Left Atrium: Left atrial size was normal in size. Right Atrium: Right atrial size was normal in size. Pericardium: There is no evidence of pericardial effusion. Mitral Valve: The mitral valve is normal in structure. No evidence of mitral valve regurgitation. No evidence of mitral valve stenosis. Tricuspid Valve: The tricuspid valve is normal in structure. Tricuspid valve regurgitation is  not demonstrated. No evidence of tricuspid stenosis. Aortic Valve: The aortic valve is tricuspid. Aortic valve regurgitation is not visualized. No aortic stenosis is present. Pulmonic Valve: The pulmonic valve was not well visualized. Pulmonic valve regurgitation is not visualized. No evidence of pulmonic stenosis. Aorta: The aortic root, ascending aorta, aortic arch and descending aorta are all structurally normal, with no evidence of dilitation or obstruction. Venous: The inferior vena cava is normal in size with greater than 50% respiratory variability, suggesting right atrial pressure of 3 mmHg. IAS/Shunts: The atrial septum is grossly normal.  LEFT VENTRICLE PLAX 2D LVIDd:         4.60 cm   Diastology LVIDs:         3.30 cm   LV e' medial:    8.27 cm/s LV PW:         0.80 cm   LV E/e' medial:  10.6 LV IVS:        0.80 cm   LV e' lateral:   11.20 cm/s LVOT diam:     2.30 cm   LV E/e' lateral: 7.8 LV SV:         65 LV SV Index:   31        2D Longitudinal Strain LVOT Area:     4.15 cm  2D Strain GLS (A2C):   -18.0 %                          2D Strain GLS (A3C):   -19.6 %                          2D Strain GLS (A4C):   -16.0 %                          2D Strain GLS Avg:     -17.9 %                           3D Volume EF:                          3D EF:        63 %                          LV EDV:  130 ml                          LV ESV:       48 ml                          LV SV:        81 ml RIGHT VENTRICLE             IVC RV Basal diam:  3.13 cm     IVC diam: 2.00 cm RV S prime:     12.80 cm/s TAPSE (M-mode): 2.4 cm LEFT ATRIUM             Index        RIGHT ATRIUM           Index LA diam:        3.30 cm 1.57 cm/m   RA Pressure: 3.00 mmHg LA Vol (A2C):   26.0 ml 12.39 ml/m  RA Area:     12.53 cm LA Vol (A4C):   20.0 ml 9.53 ml/m   RA Volume:   31.65 ml  15.08 ml/m LA Biplane Vol: 23.7 ml 11.29 ml/m  AORTIC VALVE LVOT Vmax:   88.30 cm/s LVOT Vmean:  55.850 cm/s LVOT VTI:    0.158 m  AORTA Ao Root  diam: 3.40 cm Ao Asc diam:  3.50 cm MITRAL VALVE               TRICUSPID VALVE MV Area (PHT): cm         Estimated RAP:  3.00 mmHg MV Decel Time: 206 msec MV E velocity: 87.40 cm/s  SHUNTS MV A velocity: 96.90 cm/s  Systemic VTI:  0.16 m MV E/A ratio:  0.90        Systemic Diam: 2.30 cm Jodelle Red MD Electronically signed by Jodelle Red MD Signature Date/Time: 06/16/2023/5:29:37 PM    Final    MYOCARDIAL PERFUSION IMAGING Result Date: 06/16/2023   Lexiscan stress without EKG changes   Myoview scan with normal perfuison and mid thinning in the dital inferior wall consistent with probable soft tissue attenuation (diaphragm)   LVEF calculated at 60% with normal wall motion   OVerall low risk study   Prior study not available for comparison.     Subjective: No acute issues or events overnight tolerating p.o. well this morning without complaints of nausea vomiting diarrhea constipation any fevers chills chest pain shortness of breath.   Discharge Exam: Vitals:   07/12/23 2031 07/13/23 0528  BP: 132/82 (!) 150/93  Pulse: 92 93  Resp: 15 15  Temp: 98.7 F (37.1 C) 98.4 F (36.9 C)  SpO2: 92% 91%   Vitals:   07/12/23 1320 07/12/23 1500 07/12/23 2031 07/13/23 0528  BP: 129/77  132/82 (!) 150/93  Pulse: 88  92 93  Resp: 18  15 15   Temp: 98.9 F (37.2 C)  98.7 F (37.1 C) 98.4 F (36.9 C)  TempSrc: Oral  Oral Oral  SpO2: 92%  92% 91%  Weight:      Height:  6' (1.829 m)      General: Pt is alert, awake, not in acute distress Cardiovascular: RRR, S1/S2 +, no rubs, no gallops Respiratory: CTA bilaterally, no wheezing, no rhonchi Abdominal: Soft, NT, ND, bowel sounds + Extremities: no edema, no cyanosis   The results of significant diagnostics from this hospitalization (including imaging, microbiology, ancillary and laboratory) are  listed below for reference.    Microbiology: No results found for this or any previous visit (from the past 240 hours).    Labs:  Basic Metabolic Panel: Recent Labs  Lab 07/06/23 1954 07/08/23 2354 07/09/23 0415 07/10/23 0525 07/11/23 0404  NA 135 132* 131* 132* 132*  K 4.5 4.6 4.4 3.9 3.6  CL 101 101 100 99 98  CO2 26 21* 25 21* 26  GLUCOSE 128* 158* 165* 126* 144*  BUN 30* 26* 24* 16 11  CREATININE 0.88 0.60* 0.71 0.76 0.59*  CALCIUM 9.4 8.9 8.7* 8.1* 7.8*  MG  --   --   --  2.0 2.0   Liver Function Tests: Recent Labs  Lab 07/06/23 1954 07/09/23 0415  AST 17 14*  ALT 22 14  ALKPHOS 89 89  BILITOT 0.3 0.5  PROT 7.9 6.8  ALBUMIN 3.9 3.1*   Recent Labs  Lab 07/06/23 1954  LIPASE 35   CBC: Recent Labs  Lab 07/06/23 1954 07/08/23 2354 07/09/23 0415 07/10/23 0525 07/11/23 0404  WBC 10.2 12.1* 10.0 8.7 7.4  NEUTROABS 7.4 6.9  --  5.7 4.6  HGB 15.7 15.0 14.2 14.1 13.4  HCT 46.1 42.9 40.9 42.0 38.6*  MCV 94.3 93.7 93.0 96.6 94.6  PLT 188 180 189 187 182   CBG: Recent Labs  Lab 07/12/23 0725 07/12/23 1133 07/12/23 1658 07/12/23 2029 07/13/23 0812  GLUCAP 134* 169* 231* 152* 155*   Urinalysis    Component Value Date/Time   COLORURINE YELLOW 07/23/2022 1042   APPEARANCEUR CLEAR 07/23/2022 1042   LABSPEC <=1.005 (A) 07/23/2022 1042   PHURINE 7.0 07/23/2022 1042   GLUCOSEU 250 (A) 07/23/2022 1042   HGBUR NEGATIVE 07/23/2022 1042   BILIRUBINUR NEGATIVE 07/23/2022 1042   KETONESUR NEGATIVE 07/23/2022 1042   PROTEINUR NEGATIVE 02/06/2018 0933   UROBILINOGEN 0.2 07/23/2022 1042   NITRITE POSITIVE (A) 07/23/2022 1042   LEUKOCYTESUR NEGATIVE 07/23/2022 1042   Sepsis Labs Recent Labs  Lab 07/08/23 2354 07/09/23 0415 07/10/23 0525 07/11/23 0404  WBC 12.1* 10.0 8.7 7.4   Microbiology No results found for this or any previous visit (from the past 240 hours).   Time coordinating discharge: Over 30 minutes  SIGNED:   Azucena Fallen, DO [facility] 07/13/2023, 11:33 AM Pager   If 7PM-7AM, please contact night-coverage [email or web address]

## 2023-07-13 NOTE — Progress Notes (Signed)
Patient was given discharge instructions, and all questions were answered.  Patient was stable for discharge and was taken to the main exit by wheelchair. 

## 2023-07-14 NOTE — Telephone Encounter (Signed)
Patient stated he received letter from surgeon's office yesterday to return on 07/20/2023.

## 2023-07-25 ENCOUNTER — Ambulatory Visit: Payer: Managed Care, Other (non HMO) | Admitting: Family Medicine

## 2023-07-25 ENCOUNTER — Telehealth: Payer: Self-pay

## 2023-07-25 ENCOUNTER — Encounter: Payer: Self-pay | Admitting: Family Medicine

## 2023-07-25 VITALS — BP 120/84 | HR 100 | Temp 97.5°F | Wt 184.8 lb

## 2023-07-25 DIAGNOSIS — E871 Hypo-osmolality and hyponatremia: Secondary | ICD-10-CM

## 2023-07-25 DIAGNOSIS — M25561 Pain in right knee: Secondary | ICD-10-CM

## 2023-07-25 DIAGNOSIS — K56609 Unspecified intestinal obstruction, unspecified as to partial versus complete obstruction: Secondary | ICD-10-CM

## 2023-07-25 DIAGNOSIS — E1165 Type 2 diabetes mellitus with hyperglycemia: Secondary | ICD-10-CM | POA: Diagnosis not present

## 2023-07-25 DIAGNOSIS — Z9889 Other specified postprocedural states: Secondary | ICD-10-CM

## 2023-07-25 DIAGNOSIS — M1A0610 Idiopathic chronic gout, right knee, without tophus (tophi): Secondary | ICD-10-CM

## 2023-07-25 DIAGNOSIS — Z09 Encounter for follow-up examination after completed treatment for conditions other than malignant neoplasm: Secondary | ICD-10-CM

## 2023-07-25 DIAGNOSIS — J42 Unspecified chronic bronchitis: Secondary | ICD-10-CM

## 2023-07-25 DIAGNOSIS — Z7984 Long term (current) use of oral hypoglycemic drugs: Secondary | ICD-10-CM

## 2023-07-25 DIAGNOSIS — J410 Simple chronic bronchitis: Secondary | ICD-10-CM

## 2023-07-25 DIAGNOSIS — M25461 Effusion, right knee: Secondary | ICD-10-CM

## 2023-07-25 DIAGNOSIS — R300 Dysuria: Secondary | ICD-10-CM | POA: Diagnosis not present

## 2023-07-25 DIAGNOSIS — Z8719 Personal history of other diseases of the digestive system: Secondary | ICD-10-CM

## 2023-07-25 LAB — POC URINALSYSI DIPSTICK (AUTOMATED)
Bilirubin, UA: NEGATIVE
Blood, UA: NEGATIVE
Glucose, UA: NEGATIVE
Ketones, UA: NEGATIVE
Leukocytes, UA: NEGATIVE
Nitrite, UA: NEGATIVE
Protein, UA: POSITIVE — AB
Spec Grav, UA: >=1.03 — AB (ref 1.010–1.025)
Urobilinogen, UA: 0.2 U/dL
pH, UA: 6 (ref 5.0–8.0)

## 2023-07-25 LAB — POCT GLYCOSYLATED HEMOGLOBIN (HGB A1C): Hemoglobin A1C: 8.3 % — AB (ref 4.0–5.6)

## 2023-07-25 MED ORDER — METFORMIN HCL ER 750 MG PO TB24: 750.0000 mg | ORAL_TABLET | Freq: Two times a day (BID) | ORAL | 0 refills | Status: DC

## 2023-07-25 MED ORDER — ANORO ELLIPTA 62.5-25 MCG/ACT IN AEPB: 1.0000 | INHALATION_SPRAY | Freq: Every day | RESPIRATORY_TRACT | 3 refills | Status: DC

## 2023-07-25 MED ORDER — ALBUTEROL SULFATE HFA 108 (90 BASE) MCG/ACT IN AERS: 1.0000 | INHALATION_SPRAY | Freq: Four times a day (QID) | RESPIRATORY_TRACT | 11 refills | Status: DC | PRN

## 2023-07-25 NOTE — Patient Instructions (Signed)
VISIT SUMMARY:  You had a follow-up appointment today after your recent surgery for a small bowel obstruction caused by an incarcerated right inguinal hernia. We discussed your recovery, current symptoms, and ongoing management of your chronic conditions including diabetes, COPD, and gout.  YOUR PLAN:  -POSTOPERATIVE FOLLOW-UP FOR INCARCERATED RIGHT INGUINAL HERNIA REPAIR: You are experiencing burning and pressure during urination, likely due to the medications you took after surgery. We will send your urine for culture and microanalysis to rule out any infection. Please follow up with surgery on August 08, 2023.  -DIABETES MELLITUS TYPE 2: Your blood sugar control has improved, with your hemoglobin A1c now at 8.3%. We are switching your medication to Glucophage XR 750 mg twice daily to help reduce gastrointestinal side effects. We will recheck your A1c in three months.  -HYPONATREMIA: You had low sodium levels during your hospital stay. We need to recheck your sodium levels with a basic metabolic panel (BMP).  -CHRONIC OBSTRUCTIVE PULMONARY DISEASE (COPD): Your COPD symptoms are well-managed with Anoro Ellipta, and you are using your albuterol inhaler less frequently. Continue using Anoro Ellipta daily and keep albuterol as a rescue inhaler. We have refilled both medications for you.  -GOUT: You have ongoing pain and swelling in your right knee despite taking colchicine, ibuprofen, and Tylenol. We will refer you to orthopedics for further evaluation and potential imaging. We will also check your uric acid levels.  -GENERAL HEALTH MAINTENANCE: We discussed routine health maintenance and will check for protein in your urine as part of your diabetes management. Basic lab work including urinalysis and microscopy will be ordered.  INSTRUCTIONS:  Please follow up with surgery on August 08, 2023. Our office will call you about the orthopedic referral. We will recheck your A1c in three months.

## 2023-07-25 NOTE — Telephone Encounter (Signed)
Pharmacy called in needing clarity on albuterol (VENTOLIN HFA) 108 (90 Base) MCG/ACT inhaler [[id number]] . Received two different signatures. Please advise which is the correct sig.

## 2023-07-25 NOTE — Progress Notes (Unsigned)
Assessment/Plan:   Problem List Items Addressed This Visit       Endocrine   Type 2 diabetes mellitus with hyperglycemia, without long-term current use of insulin (HCC) - Primary   Relevant Orders   POCT glycosylated hemoglobin (Hb A1C) (Completed)     Other   Hyponatremia   Other Visit Diagnoses       Dysuria       Relevant Orders   POCT Urinalysis Dipstick (Automated) (Completed)       Medications Discontinued During This Encounter  Medication Reason  . oxyCODONE (OXY IR/ROXICODONE) 5 MG immediate release tablet     No follow-ups on file.    Subjective:   Encounter date: 07/25/2023  Sean Martinez is a 63 y.o. male who has Tobacco abuse; Chronic cough; Type 2 diabetes mellitus with hyperglycemia, without long-term current use of insulin (HCC); COPD (chronic obstructive pulmonary disease) (HCC); Nonadherence to medical treatment; History of MI (myocardial infarction); Acute idiopathic gout of right knee; Pain and swelling of knee, right; SBO (small bowel obstruction) (HCC); Continuous dependence on cigarette smoking; Essential hypertension; Hyperlipidemia; Non-insulin dependent type 2 diabetes mellitus (HCC); Small bowel obstruction (HCC); and Hyponatremia on their problem list..   He  has a past medical history of Asthma, Coronary artery disease, MI, old, and Type 2 diabetes mellitus with hyperglycemia, without long-term current use of insulin (HCC) (08/10/2022).Marland Kitchen   He presents with chief complaint of Medical Management of Chronic Issues (DM, copd, Pressure and burning with urination. Refill gout medication) .   HPI:   ROS  Past Surgical History:  Procedure Laterality Date  . HERNIA REPAIR    . LEFT HEART CATHETERIZATION WITH CORONARY ANGIOGRAM N/A 07/17/2012   Procedure: LEFT HEART CATHETERIZATION WITH CORONARY ANGIOGRAM;  Surgeon: Pamella Pert, MD;  Location: [facility] CATH LAB;  Service: Cardiovascular;  Laterality: N/A;  . VENTRAL HERNIA REPAIR N/A  07/09/2023   Procedure: OPEN RIGHT INGUINAL HERNIA REPAIR; DIAGNOSTIC LAPAROSCOPY;  Surgeon: Moise Boring, MD;  Location: [facility] ORS;  Service: General;  Laterality: N/A;    Outpatient Medications Prior to Visit  Medication Sig Dispense Refill  . acetaminophen (TYLENOL) 325 MG tablet Take 2 tablets (650 mg total) by mouth every 6 (six) hours as needed for mild pain (pain score 1-3) or fever.    Marland Kitchen albuterol (VENTOLIN HFA) 108 (90 Base) MCG/ACT inhaler INHALE 2 PUFFS BY MOUTH EVERY 4 HOURS AS NEEDED FOR WHEEZING FOR SHORTNESS OF BREATH (Patient taking differently: Inhale 2 puffs into the lungs every 2 (two) hours as needed for shortness of breath or wheezing.) 18 g 0  . ANORO ELLIPTA 62.5-25 MCG/ACT AEPB INHALE 1 PUFF BY MOUTH ONCE DAILY AT  6:00 AM 60 each 0  . Blood Glucose Monitoring Suppl DEVI 1 each by Does not apply route in the morning, at noon, and at bedtime. May substitute to any manufacturer covered by patient's insurance. 1 each 0  . Colchicine 0.6 MG CAPS At onset of flair, Take 1.2 mg (two tablets). Take 0.6 mg (one tablet) one hour later. Then, take 0.6 mg (one tablet) once daily for up to 7 days as needed for swelling and pain. 30 capsule 0  . docusate sodium (COLACE) 100 MG capsule Take 1 capsule (100 mg total) by mouth daily as needed for mild constipation.    . indomethacin (INDOCIN) 50 MG capsule Take 1 capsule (50 mg total) by mouth 3 (three) times daily as needed for moderate pain (pain score 4-6) (severe pain (8-10)).  60 capsule 0  . metFORMIN (GLUCOPHAGE) 500 MG tablet Take 2 tablets (1,000 mg total) by mouth 2 (two) times daily with a meal. (Patient taking differently: Take 500 mg by mouth 2 (two) times daily with a meal.) 180 tablet 3  . methocarbamol 1000 MG TABS Take 1,000 mg by mouth 3 (three) times daily. 60 tablet 0  . polyethylene glycol (MIRALAX / GLYCOLAX) 17 g packet Take 17 g by mouth daily as needed for mild constipation.    . rosuvastatin (CRESTOR) 5 MG tablet  Take 1 tablet (5 mg total) by mouth daily. 90 tablet 3  . oxyCODONE (OXY IR/ROXICODONE) 5 MG immediate release tablet Take 1-2 tablets (5-10 mg total) by mouth every 4 (four) hours as needed for moderate pain (pain score 4-6) or severe pain (pain score 7-10). 28 tablet 0   No facility-administered medications prior to visit.    Family History  Problem Relation Age of Onset  . Diabetes Mellitus II Mother     Social History   Socioeconomic History  . Marital status: Single    Spouse name: Not on file  . Number of children: Not on file  . Years of education: Not on file  . Highest education level: Not on file  Occupational History  . Not on file  Tobacco Use  . Smoking status: Every Day    Average packs/day: 0.3 packs/day for 46.6 years (11.7 ttl pk-yrs)    Types: Cigarettes    Start date: 1978    Passive exposure: Never  . Smokeless tobacco: Never  Vaping Use  . Vaping status: Never Used  Substance and Sexual Activity  . Alcohol use: Not Currently  . Drug use: Not Currently  . Sexual activity: Not on file  Other Topics Concern  . Not on file  Social History Narrative  . Not on file   Social Drivers of Health   Financial Resource Strain: Not on file  Food Insecurity: No Food Insecurity (07/09/2023)   Hunger Vital Sign   . Worried About Programme researcher, broadcasting/film/video in the Last Year: Never true   . Ran Out of Food in the Last Year: Never true  Transportation Needs: No Transportation Needs (07/09/2023)   PRAPARE - Transportation   . Lack of Transportation (Medical): No   . Lack of Transportation (Non-Medical): No  Physical Activity: Not on file  Stress: Not on file  Social Connections: Not on file  Intimate Partner Violence: Not At Risk (07/09/2023)   Humiliation, Afraid, Rape, and Kick questionnaire   . Fear of Current or Ex-Partner: No   . Emotionally Abused: No   . Physically Abused: No   . Sexually Abused: No                                                                                                   Objective:  Physical Exam: BP 120/84   Pulse 100   Temp (!) 97.5 F (36.4 C) (Temporal)   Wt 184 lb 12.8 oz (83.8 kg)   SpO2 95%   BMI 25.06 kg/m     Physical  Exam  DG Abd Portable 1 View Result Date: 07/09/2023 CLINICAL DATA:  NG tube placement EXAM: PORTABLE ABDOMEN - 1 VIEW COMPARISON:  CT abdomen pelvis 07/09/2023. FINDINGS: Enteric tube courses below the hemidiaphragm with tip and side port overlying the expected region the gastric lumen. The bowel gas pattern is normal. No radio-opaque calculi or other significant radiographic abnormality are seen. IMPRESSION: Enteric tube in good position. Electronically Signed   By: Tish Frederickson M.D.   On: 07/09/2023 05:26   CT ABDOMEN PELVIS W CONTRAST Result Date: 07/09/2023 CLINICAL DATA:  Bowel obstruction EXAM: CT ABDOMEN AND PELVIS WITH CONTRAST TECHNIQUE: Multidetector CT imaging of the abdomen and pelvis was performed using the standard protocol following bolus administration of intravenous contrast. RADIATION DOSE REDUCTION: This exam was performed according to the departmental dose-optimization program which includes automated exposure control, adjustment of the mA and/or kV according to patient size and/or use of iterative reconstruction technique. CONTRAST:  OMNIPAQUE IOHEXOL 300 MG/ML  SOLN COMPARISON:  None Available. FINDINGS: Lower chest: No acute abnormality. Hepatobiliary: Mild hepatic steatosis. No enhancing intrahepatic mass. No intra or extrahepatic biliary ductal dilation. Gallbladder unremarkable. Pancreas: Unremarkable Spleen: Unremarkable Adrenals/Urinary Tract: Adrenal glands are unremarkable. Kidneys are normal, without renal calculi, focal lesion, or hydronephrosis. Bladder is unremarkable. Stomach/Bowel: Indirect inguinal hernia is present with the cecum, ileocecal junction, and base of the appendix within the inguinal canal. There is resultant distal small-bowel obstruction with  fecalized intraluminal contents within the terminal ileum. The colon is decompressed. No free intraperitoneal gas or fluid. Normal enhancement of the herniated bowel within the inguinal canal. Small right hydrocele is present. Moderate pancolonic diverticulosis without superimposed focal inflammatory change. The stomach, small bowel, large bowel, and appendix are otherwise unremarkable. Vascular/Lymphatic: No significant vascular findings are present. No enlarged abdominal or pelvic lymph nodes. Reproductive: Mild central prostatic hypertrophy. Other: Moderate right inguinal hernia, as above, containing a cecum and ileocecal junction. Musculoskeletal: No acute or significant osseous findings. IMPRESSION: 1. Moderate right inguinal hernia containing the cecum, ileocecal junction, and base of the appendix. Resultant distal small-bowel obstruction. No free intraperitoneal gas or fluid. 2. Mild hepatic steatosis. 3. Moderate pancolonic diverticulosis without superimposed focal inflammatory change. Electronically Signed   By: Helyn Numbers M.D.   On: 07/09/2023 02:18   CT ABDOMEN PELVIS W CONTRAST Result Date: 06/17/2023 CLINICAL DATA:  Abdominal pain. EXAM: CT ABDOMEN AND PELVIS WITH CONTRAST TECHNIQUE: Multidetector CT imaging of the abdomen and pelvis was performed using the standard protocol following bolus administration of intravenous contrast. RADIATION DOSE REDUCTION: This exam was performed according to the departmental dose-optimization program which includes automated exposure control, adjustment of the mA and/or kV according to patient size and/or use of iterative reconstruction technique. CONTRAST:  OMNIPAQUE IOHEXOL 300 MG/ML  SOLN COMPARISON:  CT scan abdomen and pelvis from 05/31/2012. FINDINGS: Lower chest: The lung bases are clear. No pleural effusion. The heart is normal in size. No pericardial effusion. Hepatobiliary: The liver is normal in size. Non-cirrhotic configuration. No suspicious  mass. These is mild diffuse hepatic steatosis. No intrahepatic or extrahepatic bile duct dilation. No calcified gallstones. Normal gallbladder wall thickness. No pericholecystic inflammatory changes. Pancreas: Unremarkable. No pancreatic ductal dilatation or surrounding inflammatory changes. Spleen: Within normal limits. No focal lesion. Adrenals/Urinary Tract: Adrenal glands are unremarkable. No suspicious renal mass. No hydronephrosis. No renal or ureteric calculi. Urinary bladder is partially distended precluding optimal assessment however, bladder wall trabeculations and several diverticula noted, suggesting sequela of chronic urinary outflow obstruction. Stomach/Bowel:  No disproportionate dilation of the small or large bowel loops. No evidence of abnormal bowel wall thickening or inflammatory changes. The appendix is unremarkable. There are multiple diverticula throughout the colon, without imaging signs of diverticulitis. Vascular/Lymphatic: No ascites or pneumoperitoneum. No abdominal or pelvic lymphadenopathy, by size criteria. No aneurysmal dilation of the major abdominal arteries. There are mild peripheral atherosclerotic vascular calcifications of the aorta and its major branches. Reproductive: Mildly enlarged prostate gland with median lobe projecting into the bladder base. Other: There is a small-to-moderate right inguinal hernia containing portions of cecum, terminal ileum and appendix. No proximal bowel dilation. No abnormal wall thickening, fat stranding and fluid in the hernia sac. There are also a tiny fat containing umbilical hernia and fat containing left inguinal hernia. The soft tissues and abdominal wall are otherwise unremarkable. Musculoskeletal: No suspicious osseous lesions. There are mild multilevel degenerative changes in the visualized spine. IMPRESSION: *Uncomplicated right inguinal hernia containing portion of cecum, terminal ileum and appendix. *Multiple other nonacute observations,  as described above. Electronically Signed   By: Jules Schick M.D.   On: 06/17/2023 14:20   ECHOCARDIOGRAM COMPLETE Result Date: 06/16/2023    ECHOCARDIOGRAM REPORT   Patient Name:   Sean Martinez Date of Exam: 06/16/2023 Medical Rec #:  [id number]           Height:       72.0 in Accession #:    [id number]          Weight:       193.0 lb Date of Birth:  Jul 26, 1960          BSA:          2.099 m Patient Age:    62 years            BP:           124/80 mmHg Patient Gender: M                   HR:           85 bpm. Exam Location:  [address] Procedure: 2D Echo, 3D Echo, Cardiac Doppler, Color Doppler and Strain Analysis Indications:    Z01.810 Pre-Operative Evaluation  History:        Patient has prior history of Echocardiogram examinations. CAD                 and Previous Myocardial Infarction, COPD; Risk Factors:Current                 Smoker, Diabetes and Dyslipidemia. History of Substance Abuse,                 Pre-Operative Evaluations.  Sonographer:    Chanetta Marshall BA, RDCS Referring Phys: [phone] THOMAS A KELLY IMPRESSIONS  1. Left ventricular ejection fraction, by estimation, is 55 to 60%. The left ventricle has normal function. The left ventricle has no regional wall motion abnormalities. Left ventricular diastolic parameters were normal. The average left ventricular global longitudinal strain is -17.9 %. The global longitudinal strain is normal.  2. Right ventricular systolic function is normal. The right ventricular size is normal. Tricuspid regurgitation signal is inadequate for assessing PA pressure.  3. The mitral valve is normal in structure. No evidence of mitral valve regurgitation. No evidence of mitral stenosis.  4. The aortic valve is tricuspid. Aortic valve regurgitation is not visualized. No aortic stenosis is present.  5. The inferior vena cava is normal in size with greater than 50%  respiratory variability, suggesting right atrial pressure of 3 mmHg. Comparison(s): Changes from  prior study are noted. EF normalized compared to study from 2013. Conclusion(s)/Recommendation(s): Normal biventricular function without evidence of hemodynamically significant valvular heart disease. FINDINGS  Left Ventricle: Left ventricular ejection fraction, by estimation, is 55 to 60%. The left ventricle has normal function. The left ventricle has no regional wall motion abnormalities. The average left ventricular global longitudinal strain is -17.9 %. The global longitudinal strain is normal. The left ventricular internal cavity size was normal in size. There is no left ventricular hypertrophy. Left ventricular diastolic parameters were normal. Right Ventricle: The right ventricular size is normal. No increase in right ventricular wall thickness. Right ventricular systolic function is normal. Tricuspid regurgitation signal is inadequate for assessing PA pressure. Left Atrium: Left atrial size was normal in size. Right Atrium: Right atrial size was normal in size. Pericardium: There is no evidence of pericardial effusion. Mitral Valve: The mitral valve is normal in structure. No evidence of mitral valve regurgitation. No evidence of mitral valve stenosis. Tricuspid Valve: The tricuspid valve is normal in structure. Tricuspid valve regurgitation is not demonstrated. No evidence of tricuspid stenosis. Aortic Valve: The aortic valve is tricuspid. Aortic valve regurgitation is not visualized. No aortic stenosis is present. Pulmonic Valve: The pulmonic valve was not well visualized. Pulmonic valve regurgitation is not visualized. No evidence of pulmonic stenosis. Aorta: The aortic root, ascending aorta, aortic arch and descending aorta are all structurally normal, with no evidence of dilitation or obstruction. Venous: The inferior vena cava is normal in size with greater than 50% respiratory variability, suggesting right atrial pressure of 3 mmHg. IAS/Shunts: The atrial septum is grossly normal.  LEFT VENTRICLE PLAX  2D LVIDd:         4.60 cm   Diastology LVIDs:         3.30 cm   LV e' medial:    8.27 cm/s LV PW:         0.80 cm   LV E/e' medial:  10.6 LV IVS:        0.80 cm   LV e' lateral:   11.20 cm/s LVOT diam:     2.30 cm   LV E/e' lateral: 7.8 LV SV:         65 LV SV Index:   31        2D Longitudinal Strain LVOT Area:     4.15 cm  2D Strain GLS (A2C):   -18.0 %                          2D Strain GLS (A3C):   -19.6 %                          2D Strain GLS (A4C):   -16.0 %                          2D Strain GLS Avg:     -17.9 %                           3D Volume EF:                          3D EF:        63 %  LV EDV:       130 ml                          LV ESV:       48 ml                          LV SV:        81 ml RIGHT VENTRICLE             IVC RV Basal diam:  3.13 cm     IVC diam: 2.00 cm RV S prime:     12.80 cm/s TAPSE (M-mode): 2.4 cm LEFT ATRIUM             Index        RIGHT ATRIUM           Index LA diam:        3.30 cm 1.57 cm/m   RA Pressure: 3.00 mmHg LA Vol (A2C):   26.0 ml 12.39 ml/m  RA Area:     12.53 cm LA Vol (A4C):   20.0 ml 9.53 ml/m   RA Volume:   31.65 ml  15.08 ml/m LA Biplane Vol: 23.7 ml 11.29 ml/m  AORTIC VALVE LVOT Vmax:   88.30 cm/s LVOT Vmean:  55.850 cm/s LVOT VTI:    0.158 m  AORTA Ao Root diam: 3.40 cm Ao Asc diam:  3.50 cm MITRAL VALVE               TRICUSPID VALVE MV Area (PHT): cm         Estimated RAP:  3.00 mmHg MV Decel Time: 206 msec MV E velocity: 87.40 cm/s  SHUNTS MV A velocity: 96.90 cm/s  Systemic VTI:  0.16 m MV E/A ratio:  0.90        Systemic Diam: 2.30 cm Jodelle Red MD Electronically signed by Jodelle Red MD Signature Date/Time: 06/16/2023/5:29:37 PM    Final    MYOCARDIAL PERFUSION IMAGING Result Date: 06/16/2023 .  Lexiscan stress without EKG changes .  Myoview scan with normal perfuison and mid thinning in the dital inferior wall consistent with probable soft tissue attenuation (diaphragm) .  LVEF calculated at  60% with normal wall motion .  OVerall low risk study .  Prior study not available for comparison.    Recent Results (from the past 2160 hours)  CBC     Status: None   Collection Time: 06/15/23  6:41 PM  Result Value Ref Range   WBC 4.8 4.0 - 10.5 K/uL   RBC 4.95 4.22 - 5.81 MIL/uL   Hemoglobin 16.1 13.0 - 17.0 g/dL   HCT 16.1 09.6 - 04.5 %   MCV 96.2 80.0 - 100.0 fL   MCH 32.5 26.0 - 34.0 pg   MCHC 33.8 30.0 - 36.0 g/dL   RDW 40.9 81.1 - 91.4 %   Platelets 166 150 - 400 K/uL   nRBC 0.0 0.0 - 0.2 %    Comment: Performed at [facility], [address], [place], [place] [address]  Basic metabolic panel     Status: Abnormal   Collection Time: 06/15/23  6:41 PM  Result Value Ref Range   Sodium 139 135 - 145 mmol/L   Potassium 4.4 3.5 - 5.1 mmol/L   Chloride 108 98 - 111 mmol/L   CO2 23 22 - 32 mmol/L   Glucose, Bld 136 (H) 70 -  99 mg/dL    Comment: Glucose reference range applies only to samples taken after fasting for at least 8 hours.   BUN 21 8 - 23 mg/dL   Creatinine, Ser 1.61 (L) 0.61 - 1.24 mg/dL   Calcium 9.1 8.9 - 09.6 mg/dL   GFR, Estimated >04 >54 mL/min    Comment: (NOTE) Calculated using the CKD-EPI Creatinine Equation (2021)    Anion gap 8 5 - 15    Comment: Performed at [facility], [address], [place], [place] [address]  ECHOCARDIOGRAM COMPLETE     Status: None   Collection Time: 06/16/23  2:30 PM  Result Value Ref Range   Area-P 1/2 3.68 cm2   S' Lateral 3.30 cm   Est EF 55 - 60%   MYOCARDIAL PERFUSION IMAGING     Status: None   Collection Time: 06/16/23  3:43 PM  Result Value Ref Range   Rest Nuclear Isotope Dose 10.9 mCi   Stress Nuclear Isotope Dose 30.7 mCi   Rest HR 86.0 bpm   Rest BP 134/94 mmHg   Peak HR 106 bpm   Peak BP 152/78 mmHg   SSS 4.0    SRS 0.0    SDS 4.0    TID 0.99    LV sys vol 35.0 mL   LV dias vol 89.0 62 - 150 mL   Nuc Stress EF 60 %   Base ST Depression (mm) 0 mm   ST Depression  (mm) 0 mm  CBC with Differential     Status: None   Collection Time: 06/17/23 12:14 PM  Result Value Ref Range   WBC 4.3 4.0 - 10.5 K/uL   RBC 4.76 4.22 - 5.81 MIL/uL   Hemoglobin 15.8 13.0 - 17.0 g/dL   HCT 91.4 78.2 - 95.6 %   MCV 95.2 80.0 - 100.0 fL   MCH 33.2 26.0 - 34.0 pg   MCHC 34.9 30.0 - 36.0 g/dL   RDW 21.3 08.6 - 57.8 %   Platelets 180 150 - 400 K/uL   nRBC 0.0 0.0 - 0.2 %   Neutrophils Relative % 48 %   Neutro Abs 2.1 1.7 - 7.7 K/uL   Lymphocytes Relative 42 %   Lymphs Abs 1.8 0.7 - 4.0 K/uL   Monocytes Relative 8 %   Monocytes Absolute 0.4 0.1 - 1.0 K/uL   Eosinophils Relative 1 %   Eosinophils Absolute 0.0 0.0 - 0.5 K/uL   Basophils Relative 1 %   Basophils Absolute 0.0 0.0 - 0.1 K/uL   Immature Granulocytes 0 %   Abs Immature Granulocytes 0.01 0.00 - 0.07 K/uL    Comment: Performed at [facility], [address], [place], [place] [address]  Basic metabolic panel     Status: Abnormal   Collection Time: 06/17/23 12:14 PM  Result Value Ref Range   Sodium 137 135 - 145 mmol/L   Potassium 4.2 3.5 - 5.1 mmol/L   Chloride 102 98 - 111 mmol/L   CO2 28 22 - 32 mmol/L   Glucose, Bld 168 (H) 70 - 99 mg/dL    Comment: Glucose reference range applies only to samples taken after fasting for at least 8 hours.   BUN 20 8 - 23 mg/dL   Creatinine, Ser 9.52 0.61 - 1.24 mg/dL   Calcium 9.1 8.9 - 84.1 mg/dL   GFR, Estimated >32 >44 mL/min    Comment: (NOTE) Calculated using the CKD-EPI Creatinine Equation (2021)    Anion gap  7 5 - 15    Comment: Performed at [facility], [address], [place], [place] [address]  Lipase, blood     Status: None   Collection Time: 07/06/23  7:54 PM  Result Value Ref Range   Lipase 35 11 - 51 U/L    Comment: Performed at [facility], [address], [place], [place] [address]  Comprehensive metabolic panel     Status: Abnormal   Collection Time: 07/06/23  7:54 PM  Result Value  Ref Range   Sodium 135 135 - 145 mmol/L   Potassium 4.5 3.5 - 5.1 mmol/L   Chloride 101 98 - 111 mmol/L   CO2 26 22 - 32 mmol/L   Glucose, Bld 128 (H) 70 - 99 mg/dL    Comment: Glucose reference range applies only to samples taken after fasting for at least 8 hours.   BUN 30 (H) 8 - 23 mg/dL   Creatinine, Ser 0.98 0.61 - 1.24 mg/dL   Calcium 9.4 8.9 - 11.9 mg/dL   Total Protein 7.9 6.5 - 8.1 g/dL   Albumin 3.9 3.5 - 5.0 g/dL   AST 17 15 - 41 U/L   ALT 22 0 - 44 U/L   Alkaline Phosphatase 89 38 - 126 U/L   Total Bilirubin 0.3 0.0 - 1.2 mg/dL   GFR, Estimated >14 >78 mL/min    Comment: (NOTE) Calculated using the CKD-EPI Creatinine Equation (2021)    Anion gap 8 5 - 15    Comment: Performed at [facility], [address] [place], [place] [address]  CBC with Differential     Status: None   Collection Time: 07/06/23  7:54 PM  Result Value Ref Range   WBC 10.2 4.0 - 10.5 K/uL   RBC 4.89 4.22 - 5.81 MIL/uL   Hemoglobin 15.7 13.0 - 17.0 g/dL   HCT 13.0 86.5 - 78.4 %   MCV 94.3 80.0 - 100.0 fL   MCH 32.1 26.0 - 34.0 pg   MCHC 34.1 30.0 - 36.0 g/dL   RDW 69.6 29.5 - 28.4 %   Platelets 188 150 - 400 K/uL   nRBC 0.0 0.0 - 0.2 %   Neutrophils Relative % 72 %   Neutro Abs 7.4 1.7 - 7.7 K/uL   Lymphocytes Relative 18 %   Lymphs Abs 1.8 0.7 - 4.0 K/uL   Monocytes Relative 9 %   Monocytes Absolute 0.9 0.1 - 1.0 K/uL   Eosinophils Relative 1 %   Eosinophils Absolute 0.1 0.0 - 0.5 K/uL   Basophils Relative 0 %   Basophils Absolute 0.0 0.0 - 0.1 K/uL   Immature Granulocytes 0 %   Abs Immature Granulocytes 0.04 0.00 - 0.07 K/uL    Comment: Performed at [facility], [address], [place], [place] [address]  CBC with Differential     Status: Abnormal   Collection Time: 07/08/23 11:54 PM  Result Value Ref Range   WBC 12.1 (H) 4.0 - 10.5 K/uL   RBC 4.58 4.22 - 5.81 MIL/uL   Hemoglobin 15.0 13.0 - 17.0 g/dL   HCT 01.0 27.2 - 53.6 %   MCV 93.7  80.0 - 100.0 fL   MCH 32.8 26.0 - 34.0 pg   MCHC 35.0 30.0 - 36.0 g/dL   RDW 64.4 03.4 - 74.2 %   Platelets 180 150 - 400 K/uL   nRBC 0.0 0.0 - 0.2 %   Neutrophils Relative % 57 %  Neutro Abs 6.9 1.7 - 7.7 K/uL   Lymphocytes Relative 26 %   Lymphs Abs 3.1 0.7 - 4.0 K/uL   Monocytes Relative 15 %   Monocytes Absolute 1.8 (H) 0.1 - 1.0 K/uL   Eosinophils Relative 0 %   Eosinophils Absolute 0.0 0.0 - 0.5 K/uL   Basophils Relative 2 %   Basophils Absolute 0.2 (H) 0.0 - 0.1 K/uL   WBC Morphology TOXIC GRANULATION    Abs Immature Granulocytes 0.00 0.00 - 0.07 K/uL   Reactive, Benign Lymphocytes PRESENT     Comment: Performed at [facility], [address] [place], [place] [address]  Basic metabolic panel     Status: Abnormal   Collection Time: 07/08/23 11:54 PM  Result Value Ref Range   Sodium 132 (L) 135 - 145 mmol/L   Potassium 4.6 3.5 - 5.1 mmol/L   Chloride 101 98 - 111 mmol/L   CO2 21 (L) 22 - 32 mmol/L   Glucose, Bld 158 (H) 70 - 99 mg/dL    Comment: Glucose reference range applies only to samples taken after fasting for at least 8 hours.   BUN 26 (H) 8 - 23 mg/dL   Creatinine, Ser 6.04 (L) 0.61 - 1.24 mg/dL   Calcium 8.9 8.9 - 54.0 mg/dL   GFR, Estimated >98 >11 mL/min    Comment: (NOTE) Calculated using the CKD-EPI Creatinine Equation (2021)    Anion gap 10 5 - 15    Comment: Performed at [facility], [address] [place], [place] [address]  HIV Antibody (routine testing w rflx)     Status: None   Collection Time: 07/09/23  4:15 AM  Result Value Ref Range   HIV Screen 4th Generation wRfx Non Reactive Non Reactive    Comment: Performed at [facility], [address] [place], [place] [address]  CBC     Status: None   Collection Time: 07/09/23  4:15 AM  Result Value Ref Range   WBC 10.0 4.0 - 10.5 K/uL   RBC 4.40 4.22 - 5.81 MIL/uL   Hemoglobin 14.2 13.0 - 17.0 g/dL   HCT 13.0 86.5 - 78.4 %   MCV 93.0 80.0 - 100.0  fL   MCH 32.3 26.0 - 34.0 pg   MCHC 34.7 30.0 - 36.0 g/dL   RDW 69.6 29.5 - 28.4 %   Platelets 189 150 - 400 K/uL   nRBC 0.0 0.0 - 0.2 %    Comment: Performed at [facility], [address], [place], [place] [address]  Protime-INR     Status: None   Collection Time: 07/09/23  4:15 AM  Result Value Ref Range   Prothrombin Time 14.9 11.4 - 15.2 seconds   INR 1.2 0.8 - 1.2    Comment: (NOTE) INR goal varies based on device and disease states. Performed at [facility], [address], [place], [place] [address]   APTT     Status: None   Collection Time: 07/09/23  4:15 AM  Result Value Ref Range   aPTT 35 24 - 36 seconds    Comment: Performed at [facility], [address], , [place] [address]  Comprehensive metabolic panel     Status: Abnormal   Collection Time: 07/09/23  4:15 AM  Result Value Ref Range   Sodium 131 (L) 135 - 145 mmol/L   Potassium 4.4 3.5 - 5.1 mmol/L   Chloride 100 98 - 111 mmol/L  CO2 25 22 - 32 mmol/L   Glucose, Bld 165 (H) 70 - 99 mg/dL    Comment: Glucose reference range applies only to samples taken after fasting for at least 8 hours.   BUN 24 (H) 8 - 23 mg/dL   Creatinine, Ser 0.45 0.61 - 1.24 mg/dL   Calcium 8.7 (L) 8.9 - 10.3 mg/dL   Total Protein 6.8 6.5 - 8.1 g/dL   Albumin 3.1 (L) 3.5 - 5.0 g/dL   AST 14 (L) 15 - 41 U/L   ALT 14 0 - 44 U/L   Alkaline Phosphatase 89 38 - 126 U/L   Total Bilirubin 0.5 0.0 - 1.2 mg/dL   GFR, Estimated >40 >98 mL/min    Comment: (NOTE) Calculated using the CKD-EPI Creatinine Equation (2021)    Anion gap 6 5 - 15    Comment: Performed at [facility], [address], [place], [place] [address]  Glucose, capillary     Status: Abnormal   Collection Time: 07/09/23  1:03 PM  Result Value Ref Range   Glucose-Capillary 126 (H) 70 - 99 mg/dL    Comment: Glucose reference range applies only to samples taken after fasting for at least 8  hours.  Surgical pathology     Status: None   Collection Time: 07/09/23  3:09 PM  Result Value Ref Range   SURGICAL PATHOLOGY      SURGICAL PATHOLOGY CASE: [id number] PATIENT: Illene Silver Surgical Pathology Report     Clinical History: incarcerated right inguinal hernia     FINAL MICROSCOPIC DIAGNOSIS:  A. RIGHT INGUINAL HERNIA SAC:      Hernia sac.      Negative for malignancy.   GROSS DESCRIPTION:  Received fresh are 4.5 x 4.5 x 0.6 cm of soft tan-red fibromembranous tissue.  There are no discrete masses.  Sections are submitted in 1 cassette.  [facility] 07/11/2023)   Final Diagnosis performed by Lance Coon, MD.   Electronically signed 07/12/2023 Technical component performed at [facility], [address], [place], [place] [address].  Professional component performed at [employer], [address] [place], [place] [address].  Immunohistochemistry Technical component (if applicable) was performed at [facility]. [address], [place], [place] [address].   IMMUNOHISTOCHEMISTRY DISCLAIMER (if applicable): Some of th ese immunohistochemical stains may have been developed and the performance characteristics determine by [facility]. Some may not have been cleared or approved by the U.S. Food and Drug Administration. The FDA has determined that such clearance or approval is not necessary. This test is used for clinical purposes. It should not be regarded as investigational or for research. This laboratory is certified under the Clinical Laboratory Improvement Amendments of 1988 (CLIA-88) as qualified to perform high complexity clinical laboratory testing.  The controls stained appropriately.   IHC stains are performed on formalin fixed, paraffin embedded tissue using a 3,3"diaminobenzidine (DAB) chromogen and Leica Bond Autostainer System. The staining intensity of the nucleus is score  manually and is reported as the percentage of tumor cell nuclei demonstrating specific nuclear staining. The specimens are fixed in 10% Neutral Formalin for at least 6 hours and up to 72hrs.  These tests are validated on decalcified tissue. Results should be interpreted with caution given the possibility of false negative results on decalcified specimens. Antibody Clones are as follows ER-clone 40F, PR-clone 16, Ki67- clone MM1. Some of these immunohistochemical stains may have been developed and the performance characteristics  determined by [facility].   Glucose, capillary     Status: Abnormal   Collection Time: 07/09/23  4:42 PM  Result Value Ref Range   Glucose-Capillary 136 (H) 70 - 99 mg/dL    Comment: Glucose reference range applies only to samples taken after fasting for at least 8 hours.  Glucose, capillary     Status: Abnormal   Collection Time: 07/09/23  8:11 PM  Result Value Ref Range   Glucose-Capillary 138 (H) 70 - 99 mg/dL    Comment: Glucose reference range applies only to samples taken after fasting for at least 8 hours.  Glucose, capillary     Status: Abnormal   Collection Time: 07/10/23  1:13 AM  Result Value Ref Range   Glucose-Capillary 135 (H) 70 - 99 mg/dL    Comment: Glucose reference range applies only to samples taken after fasting for at least 8 hours.  Glucose, capillary     Status: Abnormal   Collection Time: 07/10/23  4:57 AM  Result Value Ref Range   Glucose-Capillary 160 (H) 70 - 99 mg/dL    Comment: Glucose reference range applies only to samples taken after fasting for at least 8 hours.  Basic metabolic panel     Status: Abnormal   Collection Time: 07/10/23  5:25 AM  Result Value Ref Range   Sodium 132 (L) 135 - 145 mmol/L   Potassium 3.9 3.5 - 5.1 mmol/L   Chloride 99 98 - 111 mmol/L   CO2 21 (L) 22 - 32 mmol/L   Glucose, Bld 126 (H) 70 - 99 mg/dL    Comment: Glucose reference range applies only to samples taken after fasting for at  least 8 hours.   BUN 16 8 - 23 mg/dL   Creatinine, Ser 0.86 0.61 - 1.24 mg/dL   Calcium 8.1 (L) 8.9 - 10.3 mg/dL   GFR, Estimated >57 >84 mL/min    Comment: (NOTE) Calculated using the CKD-EPI Creatinine Equation (2021)    Anion gap 12 5 - 15    Comment: Performed at [facility], [address], [place], [place] [address]  CBC with Differential/Platelet     Status: Abnormal   Collection Time: 07/10/23  5:25 AM  Result Value Ref Range   WBC 8.7 4.0 - 10.5 K/uL   RBC 4.35 4.22 - 5.81 MIL/uL   Hemoglobin 14.1 13.0 - 17.0 g/dL   HCT 52.8 41.3 - 24.4 %   MCV 96.6 80.0 - 100.0 fL   MCH 32.4 26.0 - 34.0 pg   MCHC 33.6 30.0 - 36.0 g/dL   RDW 01.0 27.2 - 53.6 %   Platelets 187 150 - 400 K/uL   nRBC 0.0 0.0 - 0.2 %   Neutrophils Relative % 64 %   Neutro Abs 5.7 1.7 - 7.7 K/uL   Lymphocytes Relative 20 %   Lymphs Abs 1.7 0.7 - 4.0 K/uL   Monocytes Relative 14 %   Monocytes Absolute 1.2 (H) 0.1 - 1.0 K/uL   Eosinophils Relative 0 %   Eosinophils Absolute 0.0 0.0 - 0.5 K/uL   Basophils Relative 1 %   Basophils Absolute 0.0 0.0 - 0.1 K/uL   Immature Granulocytes 1 %   Abs Immature Granulocytes 0.05 0.00 - 0.07 K/uL    Comment: Performed at [facility], [address], [place], [place] [address]  Magnesium     Status: None   Collection Time: 07/10/23  5:25 AM  Result Value Ref Range   Magnesium  2.0 1.7 - 2.4 mg/dL    Comment: Performed at [facility], [address] [place], [place] [address]  Glucose, capillary     Status: Abnormal   Collection Time: 07/10/23  8:25 AM  Result Value Ref Range   Glucose-Capillary 138 (H) 70 - 99 mg/dL    Comment: Glucose reference range applies only to samples taken after fasting for at least 8 hours.  Glucose, capillary     Status: Abnormal   Collection Time: 07/10/23 11:25 AM  Result Value Ref Range   Glucose-Capillary 223 (H) 70 - 99 mg/dL    Comment: Glucose reference range applies  only to samples taken after fasting for at least 8 hours.  Glucose, capillary     Status: Abnormal   Collection Time: 07/10/23  4:02 PM  Result Value Ref Range   Glucose-Capillary 211 (H) 70 - 99 mg/dL    Comment: Glucose reference range applies only to samples taken after fasting for at least 8 hours.  Glucose, capillary     Status: Abnormal   Collection Time: 07/10/23  8:28 PM  Result Value Ref Range   Glucose-Capillary 202 (H) 70 - 99 mg/dL    Comment: Glucose reference range applies only to samples taken after fasting for at least 8 hours.  Glucose, capillary     Status: Abnormal   Collection Time: 07/10/23 11:48 PM  Result Value Ref Range   Glucose-Capillary 160 (H) 70 - 99 mg/dL    Comment: Glucose reference range applies only to samples taken after fasting for at least 8 hours.  Glucose, capillary     Status: Abnormal   Collection Time: 07/11/23  3:55 AM  Result Value Ref Range   Glucose-Capillary 151 (H) 70 - 99 mg/dL    Comment: Glucose reference range applies only to samples taken after fasting for at least 8 hours.  Basic metabolic panel     Status: Abnormal   Collection Time: 07/11/23  4:04 AM  Result Value Ref Range   Sodium 132 (L) 135 - 145 mmol/L   Potassium 3.6 3.5 - 5.1 mmol/L   Chloride 98 98 - 111 mmol/L   CO2 26 22 - 32 mmol/L   Glucose, Bld 144 (H) 70 - 99 mg/dL    Comment: Glucose reference range applies only to samples taken after fasting for at least 8 hours.   BUN 11 8 - 23 mg/dL   Creatinine, Ser 3.08 (L) 0.61 - 1.24 mg/dL   Calcium 7.8 (L) 8.9 - 10.3 mg/dL   GFR, Estimated >65 >78 mL/min    Comment: (NOTE) Calculated using the CKD-EPI Creatinine Equation (2021)    Anion gap 8 5 - 15    Comment: Performed at [facility], [address] [place], [place] [address]  CBC with Differential/Platelet     Status: Abnormal   Collection Time: 07/11/23  4:04 AM  Result Value Ref Range   WBC 7.4 4.0 - 10.5 K/uL   RBC 4.08 (L) 4.22 - 5.81  MIL/uL   Hemoglobin 13.4 13.0 - 17.0 g/dL   HCT 95.2 (L) 84.1 - 32.4 %   MCV 94.6 80.0 - 100.0 fL   MCH 32.8 26.0 - 34.0 pg   MCHC 34.7 30.0 - 36.0 g/dL   RDW 40.1 02.7 - 25.3 %   Platelets 182 150 - 400 K/uL   nRBC 0.0 0.0 - 0.2 %   Neutrophils Relative % 64 %   Neutro Abs 4.6 1.7 - 7.7 K/uL  Lymphocytes Relative 21 %   Lymphs Abs 1.6 0.7 - 4.0 K/uL   Monocytes Relative 15 %   Monocytes Absolute 1.1 (H) 0.1 - 1.0 K/uL   Eosinophils Relative 0 %   Eosinophils Absolute 0.0 0.0 - 0.5 K/uL   Basophils Relative 0 %   Basophils Absolute 0.0 0.0 - 0.1 K/uL   Immature Granulocytes 0 %   Abs Immature Granulocytes 0.03 0.00 - 0.07 K/uL    Comment: Performed at [facility], [address], [place], [place] [address]  Magnesium     Status: None   Collection Time: 07/11/23  4:04 AM  Result Value Ref Range   Magnesium 2.0 1.7 - 2.4 mg/dL    Comment: Performed at [facility], [address] [place], [place] [address]  Glucose, capillary     Status: Abnormal   Collection Time: 07/11/23  7:36 AM  Result Value Ref Range   Glucose-Capillary 135 (H) 70 - 99 mg/dL    Comment: Glucose reference range applies only to samples taken after fasting for at least 8 hours.  Glucose, capillary     Status: Abnormal   Collection Time: 07/11/23 11:55 AM  Result Value Ref Range   Glucose-Capillary 128 (H) 70 - 99 mg/dL    Comment: Glucose reference range applies only to samples taken after fasting for at least 8 hours.  Glucose, capillary     Status: Abnormal   Collection Time: 07/11/23  4:04 PM  Result Value Ref Range   Glucose-Capillary 195 (H) 70 - 99 mg/dL    Comment: Glucose reference range applies only to samples taken after fasting for at least 8 hours.  Glucose, capillary     Status: Abnormal   Collection Time: 07/11/23  8:24 PM  Result Value Ref Range   Glucose-Capillary 213 (H) 70 - 99 mg/dL    Comment: Glucose reference range applies only to samples  taken after fasting for at least 8 hours.  Glucose, capillary     Status: Abnormal   Collection Time: 07/12/23 12:12 AM  Result Value Ref Range   Glucose-Capillary 155 (H) 70 - 99 mg/dL    Comment: Glucose reference range applies only to samples taken after fasting for at least 8 hours.  Glucose, capillary     Status: Abnormal   Collection Time: 07/12/23  4:13 AM  Result Value Ref Range   Glucose-Capillary 138 (H) 70 - 99 mg/dL    Comment: Glucose reference range applies only to samples taken after fasting for at least 8 hours.  Glucose, capillary     Status: Abnormal   Collection Time: 07/12/23  7:25 AM  Result Value Ref Range   Glucose-Capillary 134 (H) 70 - 99 mg/dL    Comment: Glucose reference range applies only to samples taken after fasting for at least 8 hours.  Glucose, capillary     Status: Abnormal   Collection Time: 07/12/23 11:33 AM  Result Value Ref Range   Glucose-Capillary 169 (H) 70 - 99 mg/dL    Comment: Glucose reference range applies only to samples taken after fasting for at least 8 hours.  Glucose, capillary     Status: Abnormal   Collection Time: 07/12/23  4:58 PM  Result Value Ref Range   Glucose-Capillary 231 (H) 70 - 99 mg/dL    Comment: Glucose reference range applies only to samples taken after fasting for at least 8 hours.  Glucose, capillary     Status: Abnormal   Collection Time: 07/12/23  8:29 PM  Result Value Ref Range   Glucose-Capillary 152 (H) 70 - 99 mg/dL    Comment: Glucose reference range applies only to samples taken after fasting for at least 8 hours.  Glucose, capillary     Status: Abnormal   Collection Time: 07/13/23  8:12 AM  Result Value Ref Range   Glucose-Capillary 155 (H) 70 - 99 mg/dL    Comment: Glucose reference range applies only to samples taken after fasting for at least 8 hours.  Glucose, capillary     Status: Abnormal   Collection Time: 07/13/23 11:31 AM  Result Value Ref Range   Glucose-Capillary 171 (H) 70 - 99 mg/dL     Comment: Glucose reference range applies only to samples taken after fasting for at least 8 hours.  POCT Urinalysis Dipstick (Automated)     Status: Abnormal   Collection Time: 07/25/23  3:26 PM  Result Value Ref Range   Color, UA Yellow    Clarity, UA clear    Glucose, UA Negative Negative   Bilirubin, UA neg    Ketones, UA neg    Spec Grav, UA >=1.030 (A) 1.010 - 1.025   Blood, UA neg    pH, UA 6.0 5.0 - 8.0   Protein, UA Positive (A) Negative    Comment: 1+   Urobilinogen, UA 0.2 0.2 or 1.0 E.U./dL   Nitrite, UA neg    Leukocytes, UA Negative Negative  POCT glycosylated hemoglobin (Hb A1C)     Status: Abnormal   Collection Time: 07/25/23  3:29 PM  Result Value Ref Range   Hemoglobin A1C 8.3 (A) 4.0 - 5.6 %   HbA1c POC (<> result, manual entry)     HbA1c, POC (prediabetic range)     HbA1c, POC (controlled diabetic range)          Garner Nash, MD, MS

## 2023-07-26 LAB — BASIC METABOLIC PANEL
BUN: 23 mg/dL (ref 6–23)
CO2: 25 meq/L (ref 19–32)
Calcium: 9.1 mg/dL (ref 8.4–10.5)
Chloride: 102 meq/L (ref 96–112)
Creatinine, Ser: 0.48 mg/dL (ref 0.40–1.50)
GFR: 110.98 mL/min (ref >60.00)
Glucose, Bld: 122 mg/dL — ABNORMAL HIGH (ref 70–99)
Potassium: 3.8 meq/L (ref 3.5–5.1)
Sodium: 137 meq/L (ref 135–145)

## 2023-07-26 LAB — URINALYSIS, MICROSCOPIC ONLY

## 2023-07-26 LAB — MICROALBUMIN / CREATININE URINE RATIO
Creatinine,U: 229 mg/dL
Microalb Creat Ratio: 11.9 mg/g (ref 0.0–30.0)
Microalb, Ur: 27.3 mg/dL — ABNORMAL HIGH (ref 0.0–1.9)

## 2023-07-26 LAB — URIC ACID: Uric Acid, Serum: 3.9 mg/dL — ABNORMAL LOW (ref 4.0–7.8)

## 2023-07-26 MED ORDER — ALBUTEROL SULFATE HFA 108 (90 BASE) MCG/ACT IN AERS: 1.0000 | INHALATION_SPRAY | Freq: Four times a day (QID) | RESPIRATORY_TRACT | 11 refills | Status: AC | PRN

## 2023-07-27 ENCOUNTER — Other Ambulatory Visit: Payer: Self-pay | Admitting: Nurse Practitioner

## 2023-07-27 ENCOUNTER — Encounter: Payer: Self-pay | Admitting: Nurse Practitioner

## 2023-07-27 DIAGNOSIS — E1165 Type 2 diabetes mellitus with hyperglycemia: Secondary | ICD-10-CM

## 2023-07-27 LAB — URINE CULTURE
MICRO NUMBER:: 16033034
Result:: NO GROWTH
SPECIMEN QUALITY:: ADEQUATE

## 2023-07-27 MED ORDER — LOSARTAN POTASSIUM 25 MG PO TABS: 12.5000 mg | ORAL_TABLET | Freq: Every day | ORAL | 0 refills | Status: DC

## 2023-07-27 NOTE — Progress Notes (Signed)
 Started on losartan  12.5mg  daily for diabetes with elevated urine microalbumin. Repeat BMP in 2 weeks.

## 2023-07-28 NOTE — Telephone Encounter (Signed)
 Advised pharmacist of new sig Inhale 1-2 puffs into the lungs every 6 (six) hours as needed for wheezing or shortness of breath.

## 2023-07-28 NOTE — Telephone Encounter (Signed)
 [facility] is needing clarification with directions for albuterol  (VENTOLIN  HFA) 108 (90 Base) MCG/ACT inhaler [[id number]].  [facility] [address] [place], [place] - [address] [place], [place] [address] Phone: [phone]  Fax: [phone]

## 2023-08-08 ENCOUNTER — Other Ambulatory Visit: Payer: Self-pay | Admitting: Family Medicine

## 2023-08-08 DIAGNOSIS — E1165 Type 2 diabetes mellitus with hyperglycemia: Secondary | ICD-10-CM

## 2023-08-12 ENCOUNTER — Other Ambulatory Visit: Payer: Managed Care, Other (non HMO)

## 2023-10-24 ENCOUNTER — Ambulatory Visit: Payer: Managed Care, Other (non HMO) | Admitting: Family Medicine

## 2023-10-27 ENCOUNTER — Encounter (HOSPITAL_COMMUNITY): Payer: Self-pay

## 2023-11-03 ENCOUNTER — Ambulatory Visit: Admitting: Family Medicine

## 2023-11-03 ENCOUNTER — Encounter: Payer: Self-pay | Admitting: Family Medicine

## 2023-11-03 VITALS — BP 122/87 | HR 92 | Temp 97.9°F | Resp 18 | Wt 187.2 lb

## 2023-11-03 DIAGNOSIS — B351 Tinea unguium: Secondary | ICD-10-CM

## 2023-11-03 DIAGNOSIS — N401 Enlarged prostate with lower urinary tract symptoms: Secondary | ICD-10-CM

## 2023-11-03 DIAGNOSIS — I10 Essential (primary) hypertension: Secondary | ICD-10-CM | POA: Diagnosis not present

## 2023-11-03 DIAGNOSIS — J984 Other disorders of lung: Secondary | ICD-10-CM | POA: Diagnosis not present

## 2023-11-03 DIAGNOSIS — R351 Nocturia: Secondary | ICD-10-CM | POA: Diagnosis not present

## 2023-11-03 DIAGNOSIS — D751 Secondary polycythemia: Secondary | ICD-10-CM

## 2023-11-03 DIAGNOSIS — Z23 Encounter for immunization: Secondary | ICD-10-CM | POA: Diagnosis not present

## 2023-11-03 DIAGNOSIS — Z720 Tobacco use: Secondary | ICD-10-CM

## 2023-11-03 DIAGNOSIS — M1A09X0 Idiopathic chronic gout, multiple sites, without tophus (tophi): Secondary | ICD-10-CM

## 2023-11-03 DIAGNOSIS — E1165 Type 2 diabetes mellitus with hyperglycemia: Secondary | ICD-10-CM | POA: Diagnosis not present

## 2023-11-03 DIAGNOSIS — E11628 Type 2 diabetes mellitus with other skin complications: Secondary | ICD-10-CM

## 2023-11-03 DIAGNOSIS — I2510 Atherosclerotic heart disease of native coronary artery without angina pectoris: Secondary | ICD-10-CM | POA: Diagnosis not present

## 2023-11-03 DIAGNOSIS — J418 Mixed simple and mucopurulent chronic bronchitis: Secondary | ICD-10-CM

## 2023-11-03 DIAGNOSIS — Z1159 Encounter for screening for other viral diseases: Secondary | ICD-10-CM

## 2023-11-03 LAB — CBC WITH DIFFERENTIAL/PLATELET
Basophils Absolute: 0 10*3/uL (ref 0.0–0.1)
Basophils Relative: 0.6 % (ref 0.0–3.0)
Eosinophils Absolute: 0.1 10*3/uL (ref 0.0–0.7)
Eosinophils Relative: 1.3 % (ref 0.0–5.0)
HCT: 52 % (ref 39.0–52.0)
Hemoglobin: 17.5 g/dL — ABNORMAL HIGH (ref 13.0–17.0)
Lymphocytes Relative: 52.1 % — ABNORMAL HIGH (ref 12.0–46.0)
Lymphs Abs: 2.3 10*3/uL (ref 0.7–4.0)
MCHC: 33.6 g/dL (ref 30.0–36.0)
MCV: 97 fl (ref 78.0–100.0)
Monocytes Absolute: 0.4 10*3/uL (ref 0.1–1.0)
Monocytes Relative: 9.8 % (ref 3.0–12.0)
Neutro Abs: 1.6 10*3/uL (ref 1.4–7.7)
Neutrophils Relative %: 36.2 % — ABNORMAL LOW (ref 43.0–77.0)
Platelets: 157 10*3/uL (ref 150.0–400.0)
RBC: 5.36 Mil/uL (ref 4.22–5.81)
RDW: 13.4 % (ref 11.5–15.5)
WBC: 4.3 10*3/uL (ref 4.0–10.5)

## 2023-11-03 LAB — COMPREHENSIVE METABOLIC PANEL WITH GFR
ALT: 21 U/L (ref 0–53)
AST: 17 U/L (ref 0–37)
Albumin: 4.6 g/dL (ref 3.5–5.2)
Alkaline Phosphatase: 85 U/L (ref 39–117)
BUN: 15 mg/dL (ref 6–23)
CO2: 29 meq/L (ref 19–32)
Calcium: 9.3 mg/dL (ref 8.4–10.5)
Chloride: 101 meq/L (ref 96–112)
Creatinine, Ser: 0.77 mg/dL (ref 0.40–1.50)
GFR: 96.04 mL/min (ref >60.00)
Glucose, Bld: 132 mg/dL — ABNORMAL HIGH (ref 70–99)
Potassium: 4.7 meq/L (ref 3.5–5.1)
Sodium: 137 meq/L (ref 135–145)
Total Bilirubin: 0.4 mg/dL (ref 0.2–1.2)
Total Protein: 7.2 g/dL (ref 6.0–8.3)

## 2023-11-03 LAB — LIPID PANEL
Cholesterol: 137 mg/dL (ref 0–200)
HDL: 51.1 mg/dL (ref >39.00)
LDL Cholesterol: 55 mg/dL (ref 0–99)
NonHDL: 85.46
Total CHOL/HDL Ratio: 3
Triglycerides: 154 mg/dL — ABNORMAL HIGH (ref 0.0–149.0)
VLDL: 30.8 mg/dL (ref 0.0–40.0)

## 2023-11-03 LAB — POCT GLYCOSYLATED HEMOGLOBIN (HGB A1C): Hemoglobin A1C: 7.8 % — AB (ref 4.0–5.6)

## 2023-11-03 LAB — URINALYSIS, ROUTINE W REFLEX MICROSCOPIC
Bilirubin Urine: NEGATIVE
Hgb urine dipstick: NEGATIVE
Ketones, ur: NEGATIVE
Leukocytes,Ua: NEGATIVE
Nitrite: NEGATIVE
RBC / HPF: NONE SEEN (ref 0–?)
Specific Gravity, Urine: 1.025 (ref 1.000–1.030)
Total Protein, Urine: NEGATIVE
Urine Glucose: 250 — AB
Urobilinogen, UA: 0.2 (ref 0.0–1.0)
WBC, UA: NONE SEEN (ref 0–?)
pH: 6 (ref 5.0–8.0)

## 2023-11-03 LAB — MICROALBUMIN / CREATININE URINE RATIO
Creatinine,U: 145.7 mg/dL
Microalb Creat Ratio: 37.2 mg/g — ABNORMAL HIGH (ref 0.0–30.0)
Microalb, Ur: 5.4 mg/dL — ABNORMAL HIGH (ref 0.0–1.9)

## 2023-11-03 LAB — PSA: PSA: 0.74 ng/mL (ref 0.10–4.00)

## 2023-11-03 LAB — URIC ACID: Uric Acid, Serum: 5.2 mg/dL (ref 4.0–7.8)

## 2023-11-03 LAB — HEMOGLOBIN A1C: Hgb A1c MFr Bld: 8.2 % — ABNORMAL HIGH (ref 4.6–6.5)

## 2023-11-03 MED ORDER — TAMSULOSIN HCL 0.4 MG PO CAPS: 0.4000 mg | ORAL_CAPSULE | Freq: Every day | ORAL | 3 refills | Status: AC

## 2023-11-03 MED ORDER — METFORMIN HCL ER 500 MG PO TB24: 500.0000 mg | ORAL_TABLET | Freq: Two times a day (BID) | ORAL | 11 refills | Status: DC

## 2023-11-03 NOTE — Patient Instructions (Signed)
  VISIT SUMMARY: During your follow-up visit, we discussed your diabetes management, frequent urination, COPD, cholesterol levels, heart health, smoking-related blood issues, and nail condition. We also reviewed your general health maintenance, including vaccinations and the need for an eye exam.  YOUR PLAN: -TYPE 2 DIABETES MELLITUS WITH RENAL COMPLICATIONS: Your blood sugar control has improved, but your A1c is still higher than ideal. Protein in your urine is likely due to diabetes. We will increase your metformin  dose to 1000 mg twice daily using Glucophage  XR to reduce stomach issues. We will also recheck your kidney function and urine protein levels. Continue taking losartan  12.5 mg daily to protect your kidneys.  -BENIGN PROSTATIC HYPERPLASIA WITH NOCTURIA: Frequent nighttime urination suggests an enlarged prostate. We will check your PSA levels and start you on tamsulosin 0.4 mg daily to help reduce symptoms.  -CHRONIC OBSTRUCTIVE PULMONARY DISEASE (COPD): Your COPD symptoms are well-controlled with your current inhalers. Continue using Ellipta in the morning and albuterol  as needed.  -HYPERLIPIDEMIA: Your cholesterol levels are being managed with rosuvastatin . We will continue this medication and monitor your liver function.  -MYOCARDIAL INFARCTION: Your heart condition is stable with your current medications, including rosuvastatin  and losartan . Continue taking these medications.  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 Your last blood count was normal. We will recheck your blood count and encourage you to keep reducing your smoking.  -ONYCHOMYCOSIS: You have thickened and yellowing nails. We recommend seeing a podiatrist for specialized foot care and management of your nail condition.  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 You will receive Tdap, pneumococcal, and zoster vaccinations, and we will schedule an eye exam.  INSTRUCTIONS: Please follow up in 3 months. We will communicate your lab results via MyChart.      Contains text generated by [employer].

## 2023-11-03 NOTE — Progress Notes (Signed)
 Assessment & Plan   Assessment/Plan:        Assessment and Plan Assessment & Plan Type 2 diabetes mellitus with renal complications A1c improved to 7.8 but remains elevated. Proteinuria likely secondary to diabetes. No gastrointestinal side effects with metformin . Losartan  prescribed for renal protection and potential uric acid reduction. - Increase metformin  to 1000 mg BID, using Glucophage  XR to minimize gastrointestinal side effects - Recheck renal function and urine for protein - Continue losartan  12.5 mg daily  Benign prostatic hyperplasia with nocturia Suspected due to nocturia, with urination frequency every 2-4 hours at night. - Check PSA - Start tamsulosin 0.4 mg daily  Chronic obstructive pulmonary disease (COPD) Symptoms well-controlled with inhalers, including morning use of Ellipta and as-needed albuterol . - Continue current inhaler regimen  Hyperlipidemia Managed with rosuvastatin . Monitoring liver function due to statin use. - Continue rosuvastatin  - Monitor liver function  Myocardial infarction Condition well-managed on current regimen including rosuvastatin  and losartan . 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 Last CBC was normal. He is reducing smoking, currently at one pack every 2.5 days. - Recheck CBC - Encourage continued reduction in smoking  Onychomycosis Thickened and yellowing nails. He self-manages with trimming and grinding. - Refer to podiatrist for foot care and management of onychomycosis  General Health Maintenance Routine health maintenance discussed, including vaccinations and eye exam. He has not had recent eye exam for diabetic retinopathy. - Administer Tdap, pneumococcal, and zoster vaccinations - Schedule eye exam for diabetic retinopathy  Follow-up Plan established for ongoing management and monitoring of conditions. - Follow up in 3 months -  Communicate lab results via MyChart      Medications Discontinued During This Encounter  Medication Reason   metFORMIN  (GLUCOPHAGE -XR) 750 MG 24 hr tablet     Return in about 3 months (around 02/03/2024) for DM.        Subjective:   Encounter date: 11/03/2023  Sean Martinez is a 63 y.o. male who has Tobacco abuse; Chronic cough; Type 2 diabetes mellitus with hyperglycemia, without long-term current use of insulin  (HCC); COPD (chronic obstructive pulmonary disease) (HCC); Nonadherence to medical treatment; History of MI (myocardial infarction); Acute idiopathic gout of right knee; Pain and swelling of knee, right; SBO (small bowel obstruction) (HCC); Continuous dependence on cigarette smoking; Essential hypertension; Hyperlipidemia; Non-insulin  dependent type 2 diabetes mellitus (HCC); Small bowel obstruction (HCC); and Hyponatremia on their problem list..   He  has a past medical history of Asthma, Coronary artery disease, MI, old, and Type 2 diabetes mellitus with hyperglycemia, without long-term current use of insulin  (HCC) (08/10/2022).[name]   He presents with chief complaint of Diabetes Management Plan (3 month follow up DM. Pt fasting today. //HM due- vaccinations, colonoscopy, eye exam, foot exam ) .   Discussed the use of AI scribe software for clinical note transcription with the patient, who gave verbal consent to proceed.  History of Present Illness Sean Martinez is a 63 year old male with diabetes who presents for a follow-up visit.  He experiences frequent urination, particularly nocturia, waking every two to four hours. His hemoglobin A1c has improved to 7.8. He is on metformin  750 mg twice daily, which he previously had gastrointestinal issues with due to incorrect dosing, but has since adjusted his intake without further problems.  He has a history of proteinuria and is currently taking losartan  12.5 mg, which he tolerates well. No recent gout flares have  occurred, and uric  acid levels were stable at the last check.  He has a history of smoking, currently smoking a pack every two and a half days. His breathing is stable with no recent COPD flares, and he uses inhalers regularly, including one in the morning and another as needed. He is also taking Claritin for allergies, which he finds effective.  He has not had a recent eye exam. He performs his own foot care and has not seen a podiatrist. No chest pain or shortness of breath is reported. He has a family history of significant complications from smoking, as his sister had both legs amputated due to smoking-related issues.     ROS  Past Surgical History:  Procedure Laterality Date   HERNIA REPAIR     LEFT HEART CATHETERIZATION WITH CORONARY ANGIOGRAM N/A 07/17/2012   Procedure: LEFT HEART CATHETERIZATION WITH CORONARY ANGIOGRAM;  Surgeon: Jessica Morn, MD;  Location: [facility] CATH LAB;  Service: Cardiovascular;  Laterality: N/A;   VENTRAL HERNIA REPAIR N/A 07/09/2023   Procedure: OPEN RIGHT INGUINAL HERNIA REPAIR; DIAGNOSTIC LAPAROSCOPY;  Surgeon: Cannon Champion, MD;  Location: [facility]S;  Service: General;  Laterality: N/A;    Outpatient Medications Prior to Visit  Medication Sig Dispense Refill   acetaminophen  (TYLENOL ) 325 MG tablet Take 2 tablets (650 mg total) by mouth every 6 (six) hours as needed for mild pain (pain score 1-3) or fever.     albuterol  (VENTOLIN  HFA) 108 (90 Base) MCG/ACT inhaler Inhale 1-2 puffs into the lungs every 6 (six) hours as needed for wheezing or shortness of breath. 18 g 11   Blood Glucose Monitoring Suppl DEVI 1 each by Does not apply route in the morning, at noon, and at bedtime. May substitute to any manufacturer covered by patient's insurance. 1 each 0   Colchicine  0.6 MG CAPS At onset of flair, Take 1.2 mg (two tablets). Take 0.6 mg (one tablet) one hour later. Then, take 0.6 mg (one tablet) once daily for up to 7 days as needed for swelling and pain. 30  capsule 0   docusate sodium  (COLACE) 100 MG capsule Take 1 capsule (100 mg total) by mouth daily as needed for mild constipation.     losartan  (COZAAR ) 25 MG tablet Take 0.5 tablets (12.5 mg total) by mouth daily. 45 tablet 0   methocarbamol  1000 MG TABS Take 1,000 mg by mouth 3 (three) times daily. 60 tablet 0   polyethylene glycol (MIRALAX  / GLYCOLAX ) 17 g packet Take 17 g by mouth daily as needed for mild constipation.     rosuvastatin  (CRESTOR ) 5 MG tablet Take 1 tablet (5 mg total) by mouth daily. 90 tablet 3   umeclidinium-vilanterol (ANORO ELLIPTA ) 62.5-25 MCG/ACT AEPB Inhale 1 puff into the lungs daily. 90 each 3   indomethacin  (INDOCIN ) 50 MG capsule Take 1 capsule (50 mg total) by mouth 3 (three) times daily as needed for moderate pain (pain score 4-6) (severe pain (8-10)). (Patient not taking: Reported on 11/03/2023) 60 capsule 0   metFORMIN  (GLUCOPHAGE -XR) 750 MG 24 hr tablet Take 1 tablet (750 mg total) by mouth 2 (two) times daily. 180 tablet 0   No facility-administered medications prior to visit.    Family History  Problem Relation Age of Onset   Diabetes Mellitus II Mother     Social History   Socioeconomic History   Marital status: Single    Spouse name: Not on file   Number of children: Not on file   Years of education: Not on  file   Highest education level: Not on file  Occupational History   Not on file  Tobacco Use   Smoking status: Every Day    Average packs/day: 0.3 packs/day for 46.6 years (11.7 ttl pk-yrs)    Types: Cigarettes    Start date: 64    Passive exposure: Never   Smokeless tobacco: Never  Vaping Use   Vaping status: Never Used  Substance and Sexual Activity   Alcohol use: Not Currently   Drug use: Not Currently   Sexual activity: Not on file  Other Topics Concern   Not on file  Social History Narrative   Not on file   Social Drivers of Health   Financial Resource Strain: Not on file  Food Insecurity: No Food Insecurity (07/09/2023)    Hunger Vital Sign    Worried About Running Out of Food in the Last Year: Never true    Ran Out of Food in the Last Year: Never true  Transportation Needs: No Transportation Needs (07/09/2023)   PRAPARE - Administrator, Civil Service (Medical): No    Lack of Transportation (Non-Medical): No  Physical Activity: Not on file  Stress: Not on file  Social Connections: Not on file  Intimate Partner Violence: Not At Risk (07/09/2023)   Humiliation, Afraid, Rape, and Kick questionnaire    Fear of Current or Ex-Partner: No    Emotionally Abused: No    Physically Abused: No    Sexually Abused: No                                                                                                  Objective:  Physical Exam: BP 122/87 (BP Location: Left Arm, Patient Position: Sitting, Cuff Size: Normal)   Pulse 92   Temp 97.9 F (36.6 C) (Temporal)   Resp 18   Wt 187 lb 3.2 oz (84.9 kg)   SpO2 96%   BMI 25.39 kg/m    Physical Exam VITALS: BP- 122/87 GENERAL: Alert, cooperative, well developed, no acute distress HEENT: Normocephalic, normal oropharynx, moist mucous membranes CHEST: Clear to auscultation bilaterally, no wheezes, rhonchi, or crackles CARDIOVASCULAR: Normal heart rate and rhythm, S1 and S2 normal without murmurs ABDOMEN: Soft, non-tender, non-distended, without organomegaly, normal bowel sounds EXTREMITIES: No cyanosis, edema, or swelling in wrists  NEUROLOGICAL: Cranial nerves grossly intact, moves all extremities without gross motor or sensory deficit  Diabetic foot exam was performed with the following findings:   Normal sensation of 10g monofilament Intact posterior tibialis and dorsalis pedis pulses Onychomycosis of all nails, thick and yellowing, no ulcers or skin breakdown      Physical Exam  No results found.  Recent Results (from the past 2160 hours)  POCT glycosylated hemoglobin (Hb A1C)     Status: Abnormal   Collection Time: 11/03/23  1:13  PM  Result Value Ref Range   Hemoglobin A1C 7.8 (A) 4.0 - 5.6 %   HbA1c POC (<> result, manual entry)     HbA1c, POC (prediabetic range)     HbA1c, POC (controlled diabetic range)  Carnell Christian, MD, MS

## 2023-11-05 LAB — THYROID PANEL WITH TSH
Free Thyroxine Index: 2.6 (ref 1.4–3.8)
T3 Uptake: 32 % (ref 22–35)
T4, Total: 8.2 ug/dL (ref 4.9–10.5)
TSH: 0.91 m[IU]/L (ref 0.40–4.50)

## 2023-11-05 LAB — HEPATITIS C ANTIBODY: Hepatitis C Ab: NONREACTIVE

## 2023-11-08 ENCOUNTER — Ambulatory Visit: Payer: Self-pay | Admitting: Family Medicine

## 2023-11-08 DIAGNOSIS — E1129 Type 2 diabetes mellitus with other diabetic kidney complication: Secondary | ICD-10-CM

## 2023-11-08 DIAGNOSIS — D751 Secondary polycythemia: Secondary | ICD-10-CM | POA: Insufficient documentation

## 2023-11-22 ENCOUNTER — Ambulatory Visit (HOSPITAL_COMMUNITY): Admission: EM | Admit: 2023-11-22 | Discharge: 2023-11-22 | Disposition: A | Discharge status: Home / Self Care

## 2023-11-22 ENCOUNTER — Encounter (HOSPITAL_COMMUNITY): Payer: Self-pay | Admitting: Emergency Medicine

## 2023-11-22 DIAGNOSIS — R0789 Other chest pain: Secondary | ICD-10-CM

## 2023-11-22 NOTE — ED Triage Notes (Signed)
 Pt reports was at work and ate some chocolate cheesecake started having right sided chest pains when takes a deep breath.

## 2023-11-22 NOTE — ED Provider Notes (Signed)
 [facility]    CSN: [id number] Arrival date & time: 11/22/23  1439      History   Chief Complaint Chief Complaint  Patient presents with   Chest Pain    HPI Sean Martinez is a 63 y.o. male.   Patient presents today with 1 hour of right-sided chest pain.  Pain is fully resolved by the time he is back in an examination room.  He describes the pain as an aching, dull pain that was a 5 out of 10.  He denies any shortness of breath, cough, congestion, fever, sweating, or crushing chest pain.  No left-sided chest pain, pain going down his arm or numbness going down his arm, or jaw pain.  Pain began after eating chocolate cheesecake at work, denies indigestion or burping.  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  No recent increase in anxiety or stress.  No recent heavy lifting or pushing/pulling any heavy objects.    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 11/08/2023   SBO (small bowel obstruction) (HCC) 07/09/2023   Continuous dependence on cigarette smoking 07/09/2023   Essential hypertension 07/09/2023   Hyperlipidemia 07/09/2023   Type 2 diabetes mellitus with proteinuria (HCC) 07/09/2023   Small bowel obstruction (HCC) 07/09/2023   Hyponatremia 07/09/2023   Acute idiopathic gout of right knee 06/28/2023   Pain and swelling of knee, right 06/28/2023   History of MI (myocardial infarction) 02/09/2023   Nonadherence to medical treatment 10/20/2022   Type 2 diabetes mellitus with hyperglycemia, without long-term current use of insulin  (HCC) 08/10/2022   Chronic cough 07/22/2022   COPD (chronic obstructive pulmonary disease) (HCC) 07/22/2022   Tobacco abuse 05/30/2012    Past Surgical  History:  Procedure Laterality Date   HERNIA REPAIR     LEFT HEART CATHETERIZATION WITH CORONARY ANGIOGRAM N/A 07/17/2012   Procedure: LEFT HEART CATHETERIZATION WITH CORONARY ANGIOGRAM;  Surgeon: Alba Kriesel Morn, MD;  Location: [facility] CATH LAB;  Service: Cardiovascular;  Laterality: N/A;   VENTRAL HERNIA REPAIR N/A 07/09/2023   Procedure: OPEN RIGHT INGUINAL HERNIA REPAIR; DIAGNOSTIC LAPAROSCOPY;  Surgeon: Cannon Champion, MD;  Location: [facility];  Service: General;  Laterality: N/A;       Home Medications    Prior to Admission medications   Medication Sig Start Date End Date Taking? Authorizing Provider  acetaminophen  (TYLENOL ) 325 MG tablet Take 2 tablets (650 mg total) by mouth every 6 (six) hours as needed for mild pain (pain score 1-3) or fever. 07/13/23   Annetta Killian, PA-C  albuterol  (VENTOLIN  HFA) 108 (90 Base) MCG/ACT inhaler Inhale 1-2 puffs into the lungs every 6 (six) hours as needed for wheezing or shortness of breath. 07/26/23 07/20/24  Catheryn Cluck, MD  Blood Glucose Monitoring Suppl DEVI 1 each by Does not apply route in the morning, at noon, and at bedtime. May substitute to any manufacturer covered by patient's insurance. 07/23/22   Catheryn Cluck, MD  Colchicine  0.6 MG CAPS At onset of flair, Take 1.2 mg (two tablets). Take 0.6 mg (one tablet) one hour later. Then, take 0.6 mg (one tablet) once daily for up to 7 days as needed for swelling  and pain. 06/28/23   Catheryn Cluck, MD  docusate sodium  (COLACE) 100 MG capsule Take 1 capsule (100 mg total) by mouth daily as needed for mild constipation. 07/13/23   Annetta Killian, PA-C  indomethacin  (INDOCIN ) 50 MG capsule Take 1 capsule (50 mg total) by mouth 3 (three) times daily as needed for moderate pain (pain score 4-6) (severe pain (8-10)). Patient not taking: Reported on 11/03/2023 06/29/23   Catheryn Cluck, MD  losartan  (COZAAR ) 25 MG tablet Take 0.5 tablets (12.5 mg total) by mouth daily. 07/27/23   McElwee, Adolfo Hooker,  NP  metFORMIN  (GLUCOPHAGE -XR) 500 MG 24 hr tablet Take 1 tablet (500 mg total) by mouth 2 (two) times daily with a meal. 11/03/23 10/28/24  Catheryn Cluck, MD  methocarbamol  1000 MG TABS Take 1,000 mg by mouth 3 (three) times daily. 07/13/23   Annetta Killian, PA-C  polyethylene glycol (MIRALAX  / GLYCOLAX ) 17 g packet Take 17 g by mouth daily as needed for mild constipation. 07/13/23   Annetta Killian, PA-C  rosuvastatin  (CRESTOR ) 5 MG tablet Take 1 tablet (5 mg total) by mouth daily. 02/09/23   Catheryn Cluck, MD  tamsulosin  (FLOMAX ) 0.4 MG CAPS capsule Take 1 capsule (0.4 mg total) by mouth daily. 11/03/23   Catheryn Cluck, MD  umeclidinium-vilanterol (ANORO ELLIPTA ) 62.5-25 MCG/ACT AEPB Inhale 1 puff into the lungs daily. 07/25/23 07/19/24  Catheryn Cluck, MD    Family History Family History  Problem Relation Age of Onset   Diabetes Mellitus II Mother     Social History Social History   Tobacco Use   Smoking status: Every Day    Average packs/day: 0.3 packs/day for 46.6 years (11.7 ttl pk-yrs)    Types: Cigarettes    Start date: 1978    Passive exposure: Never   Smokeless tobacco: Never  Vaping Use   Vaping status: Never Used  Substance Use Topics   Alcohol use: Not Currently   Drug use: Not Currently     Allergies   Penicillins   Review of Systems Review of Systems Per HPI  Physical Exam Triage Vital Signs ED Triage Vitals  Encounter Vitals Group     BP 11/22/23 1444 122/89     Systolic BP Percentile --      Diastolic BP Percentile --      Pulse Rate 11/22/23 1444 95     Resp 11/22/23 1444 17     Temp 11/22/23 1444 98.5 F (36.9 C)     Temp Source 11/22/23 1444 Oral     SpO2 11/22/23 1444 95 %     Weight --      Height --      Head Circumference --      Peak Flow --      Pain Score 11/22/23 1443 5     Pain Loc --      Pain Education --      Exclude from Growth Chart --    No data found.  Updated Vital Signs BP 122/89 (BP Location: Right Arm)    Pulse 95   Temp 98.5 F (36.9 C) (Oral)   Resp 17   SpO2 95%   Visual Acuity Right Eye Distance:   Left Eye Distance:   Bilateral Distance:    Right Eye Near:   Left Eye Near:    Bilateral Near:     Physical Exam Vitals and nursing note reviewed.  Constitutional:      General: He is  not in acute distress.    Appearance: He is well-developed. He is not toxic-appearing.  HENT:     Head: Normocephalic and atraumatic.  Cardiovascular:     Rate and Rhythm: Normal rate.     Heart sounds: Normal heart sounds. Heart sounds not distant.  Pulmonary:     Effort: Pulmonary effort is normal. No tachypnea or respiratory distress.     Breath sounds: No decreased breath sounds, wheezing, rhonchi or rales.  Chest:     Chest wall: No mass or tenderness.       Comments: Pain to area marked; fully resolved during exam and not reproducible on exam.  No discoloration or swelling. Skin:    General: Skin is warm and dry.     Capillary Refill: Capillary refill takes less than 2 seconds.     Coloration: Skin is not cyanotic or pale.     Findings: No erythema.  Neurological:     Mental Status: He is alert and oriented to person, place, and time.  Psychiatric:        Behavior: Behavior is cooperative.      UC Treatments / Results  Labs (all labs ordered are listed, but only abnormal results are displayed) Labs Reviewed - No data to display  EKG   Radiology No results found.  Procedures Procedures (including critical care time)  Medications Ordered in UC Medications - No data to display  Initial Impression / Assessment and Plan / UC Course  I have reviewed the triage vital signs and the nursing notes.  Pertinent labs & imaging results that were available during my care of the patient were reviewed by me and considered in my medical decision making (see chart for details).   Patient is well-appearing, normotensive, afebrile, not tachycardic, not tachypneic, oxygenating well on  room air.    1. Right-sided chest wall pain Unclear etiology; suspect chest wall pain or possible indigestion that has now resolved Pain is not present or reproducible on exam today EKG today shows normal sinus rhythm without ST segment or T wave abnormality and stable when compared with previous EKG Supportive care discussed with patient, recommended Tylenol  as needed if pain recurs Strict ER precautions discussed if pain recurs and is severe or any red flag symptoms  The patient was given the opportunity to ask questions.  All questions answered to their satisfaction.  The patient is in agreement to this plan.   Final Clinical Impressions(s) / UC Diagnoses   Final diagnoses:  Right-sided chest wall pain     Discharge Instructions      EKG today looks great.  Since the pain is fully resolved, I do not believe you need to be evaluated emergently for the pain.  If it recurs, you can try Tylenol  as needed for the pain.  If you develop severe chest pain, shortness of breath, sweating, or does not improve with Tylenol , please seek care emergently.  ED Prescriptions   None    PDMP not reviewed this encounter.   Wilhemena Harbour, NP 11/22/23 [phone]

## 2023-11-22 NOTE — Discharge Instructions (Signed)
 EKG today looks great.  Since the pain is fully resolved, I do not believe you need to be evaluated emergently for the pain.  If it recurs, you can try Tylenol  as needed for the pain.  If you develop severe chest pain, shortness of breath, sweating, or does not improve with Tylenol , please seek care emergently.

## 2024-01-11 ENCOUNTER — Telehealth: Payer: Self-pay

## 2024-01-11 NOTE — Telephone Encounter (Signed)
 Called to speak to patient about phone message sent via fax for blisters on penis. Note stated advised patient to see PCP in 24hrs or go to UC. Left VM for patient to return call to discuss.

## 2024-01-19 ENCOUNTER — Ambulatory Visit: Admitting: Family Medicine

## 2024-01-25 ENCOUNTER — Other Ambulatory Visit: Payer: Self-pay | Admitting: Family Medicine

## 2024-01-25 DIAGNOSIS — I252 Old myocardial infarction: Secondary | ICD-10-CM

## 2024-02-03 ENCOUNTER — Ambulatory Visit: Admitting: Family Medicine

## 2024-02-06 ENCOUNTER — Ambulatory Visit (INDEPENDENT_AMBULATORY_CARE_PROVIDER_SITE_OTHER): Admitting: Internal Medicine

## 2024-02-06 ENCOUNTER — Encounter: Payer: Self-pay | Admitting: Internal Medicine

## 2024-02-06 VITALS — BP 130/86 | HR 90 | Temp 98.4°F | Ht 72.0 in | Wt 188.4 lb

## 2024-02-06 DIAGNOSIS — I10 Essential (primary) hypertension: Secondary | ICD-10-CM | POA: Diagnosis not present

## 2024-02-06 DIAGNOSIS — Z720 Tobacco use: Secondary | ICD-10-CM

## 2024-02-06 DIAGNOSIS — Z1211 Encounter for screening for malignant neoplasm of colon: Secondary | ICD-10-CM

## 2024-02-06 DIAGNOSIS — J418 Mixed simple and mucopurulent chronic bronchitis: Secondary | ICD-10-CM

## 2024-02-06 DIAGNOSIS — K4090 Unilateral inguinal hernia, without obstruction or gangrene, not specified as recurrent: Secondary | ICD-10-CM

## 2024-02-06 DIAGNOSIS — E1165 Type 2 diabetes mellitus with hyperglycemia: Secondary | ICD-10-CM | POA: Diagnosis not present

## 2024-02-06 DIAGNOSIS — Z7984 Long term (current) use of oral hypoglycemic drugs: Secondary | ICD-10-CM

## 2024-02-06 DIAGNOSIS — E785 Hyperlipidemia, unspecified: Secondary | ICD-10-CM | POA: Diagnosis not present

## 2024-02-06 MED ORDER — BREZTRI AEROSPHERE 160-9-4.8 MCG/ACT IN AERO: 2.0000 | INHALATION_SPRAY | Freq: Two times a day (BID) | RESPIRATORY_TRACT | 11 refills | Status: AC

## 2024-02-06 MED ORDER — LOSARTAN POTASSIUM 25 MG PO TABS: 12.5000 mg | ORAL_TABLET | Freq: Every day | ORAL | 3 refills | Status: AC

## 2024-02-06 NOTE — Patient Instructions (Signed)
  VISIT SUMMARY: Today, we discussed the management of your type 2 diabetes, hypertension, COPD symptoms, and other health concerns. We reviewed your medications and made some adjustments to better control your symptoms. We also addressed your need for a colonoscopy and prostate exam.  YOUR PLAN: -CHRONIC OBSTRUCTIVE PULMONARY DISEASE (COPD): COPD is a chronic lung condition that makes it hard to breathe. We are changing your inhaler from Anoro Ellipta  to Breztri  to better manage your symptoms. Continue using albuterol  as needed and take plain Mucinex  for your productive cough. We also discussed potential insurance issues with Breztri .  -NICOTINE  DEPENDENCE, CIGARETTES: Nicotine  dependence means you are addicted to smoking. You have reduced your smoking from one pack to half a pack per day, which is a positive step.  -PAIN AFTER RIGHT INGUINAL HERNIA REPAIR: You have pain at the site of your previous hernia repair, which could indicate a recurrence. We performed a physical exam and ordered an ultrasound to investigate further.  -TYPE 2 DIABETES MELLITUS: Type 2 diabetes is a condition where your body does not use insulin  properly. Continue taking metformin  750 mg once daily. We have ordered blood work to check your A1c and kidney function.  -HYPERTENSION: Hypertension is high blood pressure. Your blood pressure is well-controlled with losartan . We have refilled your prescription for losartan  12.5 mg once daily.  -HYPERLIPIDEMIA: Hyperlipidemia is high cholesterol. Continue taking Crestor  as prescribed.  -GENERAL HEALTH MAINTENANCE: You are due for a colonoscopy. We will refer you to a specialist for the colonoscopy. You also need an eye exam; please schedule one and have the results sent to our office.  INSTRUCTIONS: Please follow up with the blood work for your A1c and kidney function. Schedule your colonoscopy and eye exam, and have the eye exam results faxed to our office. Continue taking your  medications as prescribed and use the new inhaler as directed. We will contact you with the results of your ultrasound.                      Contains text generated by [employer]bridge.                                 Contains text generated by [employer]bridge.

## 2024-02-06 NOTE — Progress Notes (Signed)
 [facility] PRIMARY CARE [facility] [address] [place] [address] Dept: [phone] Dept Fax: [phone]    Subjective:   Sean Martinez [birth year] 02/06/2024  Chief Complaint  Patient presents with   Numbness    Both finger tips   Hernia    Pain after surgery    COPD    New inhaler,  medication not working    Prostate Check    Referral    Colonoscopy    Referral     HPI: Sean Martinez presents today for re-assessment and management of chronic medical conditions.  Discussed the use of AI scribe software for clinical note transcription with the patient, who gave verbal consent to proceed.  History of Present Illness   Sean Martinez is a 63 year old male with type 2 diabetes, hypertension, and COPD who presents for diabetes management and COPD symptom evaluation.  He has type 2 diabetes managed with metformin  750 mg once daily. He has made dietary changes, such as stopping the consumption of sweets.   He has hypertension and is prescribed losartan  12.5 mg once daily, but he has run out and needs a refill.  He has high cholesterol and takes Crestor  5 mg once daily.  He has COPD and uses Anoro Ellipta  inhaler once daily in the morning. He has a persistent, irritating, and productive cough throughout the day. He uses albuterol  more than every six hours, about five to seven times per day. He experiences headaches due to shortness of breath, so he chooses to take his albuterol  more often to help him breathe instead of having a headache. He smokes about half a pack of cigarettes per day, having reduced from a pack a day. He experiences wheezing after coughing and shortness of breath with exertion, such as walking 30 feet to the bathroom or rushing at work.  He had a hernia repair in January 2025 for a incarcerated right inguinal hernia that led to a bowel obstruction. He has pain at the site of the repair only when coughing,  which started about a month ago and lasts two to three minutes, then resolves. No urinary symptoms.   He has not had a colonoscopy before and is seeking a referral for colon cancer screening. He also has not had a prostate exam since he was 63 years old and is interested in having one done. He declines PCP to do a prostate exam today. He did have PSA level drawn 3 months ago that was WNL.   Lab Results  Component Value Date   PSA 0.74 11/03/2023    Lab Results  Component Value Date   HGBA1C 8.2 (H) 11/03/2023   HGBA1C 7.8 (A) 11/03/2023   HGBA1C 8.3 (A) 07/25/2023   Last metabolic panel Lab Results  Component Value Date   GLUCOSE 132 (H) 11/03/2023   NA 137 11/03/2023   K 4.7 11/03/2023   CL 101 11/03/2023   CO2 29 11/03/2023   BUN 15 11/03/2023   CREATININE 0.77 11/03/2023   GFR 96.04 11/03/2023   CALCIUM  9.3 11/03/2023   PROT 7.2 11/03/2023   ALBUMIN  4.6 11/03/2023   BILITOT 0.4 11/03/2023   ALKPHOS 85 11/03/2023   AST 17 11/03/2023   ALT 21 11/03/2023   ANIONGAP 8 07/11/2023           The following portions of the patient's history were reviewed and updated as appropriate: past medical history, past surgical history, family history, social history, allergies, medications, and  problem list.   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 11/08/2023   SBO (small bowel obstruction) (HCC) 07/09/2023   Continuous dependence on cigarette smoking 07/09/2023   Essential hypertension 07/09/2023   Hyperlipidemia 07/09/2023   Type 2 diabetes mellitus with proteinuria (HCC) 07/09/2023   Small bowel obstruction (HCC) 07/09/2023   Hyponatremia 07/09/2023   Acute idiopathic gout of right knee 06/28/2023   Pain and swelling of knee, right 06/28/2023   History of MI (myocardial infarction) 02/09/2023   Nonadherence to medical treatment 10/20/2022   Type 2 diabetes mellitus with hyperglycemia, without long-term current use of  insulin  (HCC) 08/10/2022   Chronic cough 07/22/2022   COPD (chronic obstructive pulmonary disease) (HCC) 07/22/2022   Tobacco abuse 05/30/2012   Past Medical History:  Diagnosis Date   Asthma    Coronary artery disease    MI, old    Type 2 diabetes mellitus with hyperglycemia, without long-term current use of insulin  (HCC) 08/10/2022   Past Surgical History:  Procedure Laterality Date   HERNIA REPAIR     LEFT HEART CATHETERIZATION WITH CORONARY ANGIOGRAM N/A 07/17/2012   Procedure: LEFT HEART CATHETERIZATION WITH CORONARY ANGIOGRAM;  Surgeon: Erick JONELLE Bergamo, MD;  Location: [facility] CATH LAB;  Service: Cardiovascular;  Laterality: N/A;   VENTRAL HERNIA REPAIR N/A 07/09/2023   Procedure: OPEN RIGHT INGUINAL HERNIA REPAIR; DIAGNOSTIC LAPAROSCOPY;  Surgeon: Polly Cordella LABOR, MD;  Location: [facility] ORS;  Service: General;  Laterality: N/A;   Family History  Problem Relation Age of Onset   Diabetes Mellitus II Mother     Current Outpatient Medications:    acetaminophen  (TYLENOL ) 325 MG tablet, Take 2 tablets (650 mg total) by mouth every 6 (six) hours as needed for mild pain (pain score 1-3) or fever., Disp: , Rfl:    albuterol  (VENTOLIN  HFA) 108 (90 Base) MCG/ACT inhaler, Inhale 1-2 puffs into the lungs every 6 (six) hours as needed for wheezing or shortness of breath., Disp: 18 g, Rfl: 11   Blood Glucose Monitoring Suppl DEVI, 1 each by Does not apply route in the morning, at noon, and at bedtime. May substitute to any manufacturer covered by patient's insurance., Disp: 1 each, Rfl: 0   budesonide-glycopyrrolate-formoterol (BREZTRI  AEROSPHERE) 160-9-4.8 MCG/ACT AERO inhaler, Inhale 2 puffs into the lungs 2 (two) times daily., Disp: 10.7 g, Rfl: 11   indomethacin  (INDOCIN ) 50 MG capsule, Take 1 capsule (50 mg total) by mouth 3 (three) times daily as needed for moderate pain (pain score 4-6) (severe pain (8-10))., Disp: 60 capsule, Rfl: 0   metFORMIN  (GLUCOPHAGE -XR) 750 MG 24 hr tablet, Take 750 mg  by mouth daily with breakfast., Disp: , Rfl:    rosuvastatin  (CRESTOR ) 5 MG tablet, Take 1 tablet by mouth once daily, Disp: 90 tablet, Rfl: 0   Colchicine  0.6 MG CAPS, At onset of flair, Take 1.2 mg (two tablets). Take 0.6 mg (one tablet) one hour later. Then, take 0.6 mg (one tablet) once daily for up to 7 days as needed for swelling and pain. (Patient not taking: Reported on 02/06/2024), Disp: 30 capsule, Rfl: 0   losartan  (COZAAR ) 25 MG tablet, Take 0.5 tablets (12.5 mg total) by mouth daily., Disp: 45 tablet, Rfl: 3   tamsulosin  (FLOMAX ) 0.4 MG CAPS capsule, Take 1 capsule (0.4 mg total) by mouth daily. (Patient not taking: Reported on 02/06/2024), Disp: 30 capsule, Rfl: 3 Allergies  Allergen Reactions   Penicillins Hives    Has patient  had a PCN reaction causing immediate rash, facial/tongue/throat swelling, SOB or lightheadedness with hypotension: Yes Has patient had a PCN reaction causing severe rash involving mucus membranes or skin necrosis: No Has patient had a PCN reaction that required hospitalization: No Has patient had a PCN reaction occurring within the last 10 years: No If all of the above answers are NO, then may proceed with Cephalosporin use.  Received Ancef  for surgery on 07/09/23 with no issues.     ROS: A complete ROS was performed with pertinent positives/negatives noted in the HPI. The remainder of the ROS are negative.    Objective:   Today's Vitals   02/06/24 1544  BP: 130/86  Pulse: 90  Temp: 98.4 F (36.9 C)  TempSrc: Temporal  SpO2: 96%  Weight: 188 lb 6.4 oz (85.5 kg)  Height: 6' (1.829 m)    GENERAL: Well-appearing, in NAD. Well nourished.  SKIN: Pink, warm and dry. No rash, lesion, ulceration, or ecchymoses.  NECK: Trachea midline. Full ROM w/o pain or tenderness. No lymphadenopathy. No thyromegaly or palpable masses.  RESPIRATORY: Chest wall symmetrical. Respirations even and non-labored. Breath sounds clear to auscultation bilaterally.  CARDIAC:  S1, S2 present, regular rate and rhythm. Peripheral pulses 2+ bilaterally.  MSK: Muscle tone and strength appropriate for age. GU:  No scrotal swelling or discoloration. Testes descended bilaterally. Epididymis non-tender. No palpable masses. No palpable inguinal or femoral hernias.  Chaperoned by : Angelyn Hollingsworth CMA EXTREMITIES: Without clubbing, cyanosis, or edema.  NEUROLOGIC: Steady, even gait.  PSYCH/MENTAL STATUS: Alert, oriented x 3. Cooperative, appropriate mood and affect.   Health Maintenance Due  Topic Date Due   OPHTHALMOLOGY EXAM  08/09/2023    No results found for any visits on 02/06/24.  The ASCVD Risk score (Arnett DK, et al., 2019) failed to calculate for the following reasons:   Risk score cannot be calculated because patient has a medical history suggesting prior/existing ASCVD     Assessment & Plan:   Assessment and Plan    Chronic obstructive pulmonary disease (COPD) with increased symptoms COPD with increased symptoms, frequent cough, and increased albuterol  use. Current inhaler regimen suboptimal. Continues smoking half a pack per day. - Discontinue Anoro Ellipta . - Prescribe Breztri  inhaler. - Recommend plain Mucinex  for productive cough. - Advise continued use of albuterol  as needed.  Nicotine  dependence, cigarettes Nicotine  dependence with current smoking of half a pack per day, reduced from one pack per day. - Advised tobacco cessation  Pain after right inguinal hernia repair Pain at hernia repair site with cough, concern for hernia recurrence. - Order ultrasound of inguinal area. - patient declines PCP doing prostate exam at this time  Type 2 diabetes mellitus Type 2 diabetes managed with metformin . Blood work due for A1c and kidney function. - Order blood work for A1c and kidney function. - Continue metformin  750 mg once daily.  Hypertension Hypertension managed with losartan . Blood pressure well-controlled at 130/86 mmHg. - Refill losartan   12.5 mg once daily. - Check BMP  Hyperlipidemia Hyperlipidemia managed with Crestor .  General Health Maintenance Due for colonoscopy. Needs eye exam. Up to date on pneumonia vaccine. - Refer to GI for colonoscopy. - Advise to schedule eye exam and have results faxed to office.      Orders Placed This Encounter  Procedures   US  PELVIS LIMITED (TRANSABDOMINAL ONLY)    Standing Status:   Future    Expiration Date:   02/05/2025    Reason for Exam (SYMPTOM  OR DIAGNOSIS REQUIRED):  right inguinal pain, hx of right inguinal hernia    Preferred imaging location?:   [facility]   Hemoglobin A1C   Basic Metabolic Panel (BMET)   Ambulatory referral to Gastroenterology    Referral Priority:   Routine    Referral Type:   Consultation    Referral Reason:   Specialty Services Required    Number of Visits Requested:   1   No images are attached to the encounter or orders placed in the encounter. Meds ordered this encounter  Medications   losartan  (COZAAR ) 25 MG tablet    Sig: Take 0.5 tablets (12.5 mg total) by mouth daily.    Dispense:  45 tablet    Refill:  3    Supervising Provider:   THOMPSON, AARON B [[id number]]   budesonide-glycopyrrolate-formoterol (BREZTRI  AEROSPHERE) 160-9-4.8 MCG/ACT AERO inhaler    Sig: Inhale 2 puffs into the lungs 2 (two) times daily.    Dispense:  10.7 g    Refill:  11    Supervising Provider:   SEBASTIAN BEVERLEY NOVAK [[id number]]    Return in about 4 months (around 06/07/2024) for Chronic Condition follow up - fasting lab work at exam.   Rosina Senters, FNP

## 2024-02-07 LAB — BASIC METABOLIC PANEL WITH GFR
BUN: 12 mg/dL (ref 6–23)
CO2: 32 meq/L (ref 19–32)
Calcium: 9.3 mg/dL (ref 8.4–10.5)
Chloride: 97 meq/L (ref 96–112)
Creatinine, Ser: 0.84 mg/dL (ref 0.40–1.50)
GFR: 93.37 mL/min (ref >60.00)
Glucose, Bld: 125 mg/dL — ABNORMAL HIGH (ref 70–99)
Potassium: 4.2 meq/L (ref 3.5–5.1)
Sodium: 137 meq/L (ref 135–145)

## 2024-02-07 LAB — HEMOGLOBIN A1C: Hgb A1c MFr Bld: 8.2 % — ABNORMAL HIGH (ref 4.6–6.5)

## 2024-02-09 ENCOUNTER — Ambulatory Visit: Payer: Self-pay | Admitting: Internal Medicine

## 2024-02-21 ENCOUNTER — Encounter (HOSPITAL_COMMUNITY): Payer: Self-pay

## 2024-02-21 ENCOUNTER — Ambulatory Visit (HOSPITAL_COMMUNITY): Admission: EM | Admit: 2024-02-21 | Discharge: 2024-02-21 | Disposition: A | Discharge status: Home / Self Care

## 2024-02-21 DIAGNOSIS — J441 Chronic obstructive pulmonary disease with (acute) exacerbation: Secondary | ICD-10-CM | POA: Diagnosis not present

## 2024-02-21 MED ORDER — PREDNISONE 20 MG PO TABS: 40.0000 mg | ORAL_TABLET | Freq: Every day | ORAL | 0 refills | Status: AC

## 2024-02-21 MED ORDER — IPRATROPIUM-ALBUTEROL 0.5-2.5 (3) MG/3ML IN SOLN
RESPIRATORY_TRACT | Status: AC
  Filled 2024-02-21: qty 3

## 2024-02-21 MED ORDER — IPRATROPIUM-ALBUTEROL 0.5-2.5 (3) MG/3ML IN SOLN
3.0000 mL | Freq: Once | RESPIRATORY_TRACT | Status: AC
  Administered 2024-02-21: 3 mL via RESPIRATORY_TRACT

## 2024-02-21 NOTE — Discharge Instructions (Signed)
  1. COPD exacerbation (HCC) (Primary) - ipratropium-albuterol  (DUONEB) 0.5-2.5 (3) MG/3ML nebulizer solution 3 mL given in UC for acute wheezing and COPD exacerbation - predniSONE  (DELTASONE ) 20 MG tablet; Take 2 tablets (40 mg total) by mouth daily for 5 days.  Dispense: 10 tablet; Refill: 0 - Continue to use albuterol  inhaler as needed for any wheezing or dyspnea secondary to current exacerbation -Continue to monitor symptoms for any change in severity if there is any escalation of current symptoms or development of new symptoms follow-up in ER for further evaluation and management.

## 2024-02-21 NOTE — ED Triage Notes (Signed)
 Pt c/o SOB x2 hrs, used inhaler with no relief. States has a chronic cough and COPD. Pt has labored breathing and speaking in complete sentences.

## 2024-02-21 NOTE — ED Provider Notes (Signed)
 [facility]  Note:  This document was prepared using Dragon voice recognition software and may include unintentional dictation errors.  MRN: [id number] DOB: [birth year]  Subjective:   Sean Martinez is a 63 y.o. male presenting for acute dyspnea and wheezing x 2 hours.  Patient reports he used his albuterol  inhaler with minimal improvement.  Patient has chronic COPD and asthma and has a history of cigarette smoking.  Patient reports that he all of a sudden developed labored breathing but is able to speak in full sentences oxygen saturation 96%, patient denies any cough, nasal congestion, body ache, fever prior to the onset of acute dyspnea.  No chest pain, weakness, dizziness, abdominal pain, nausea/vomiting.  No current facility-administered medications for this encounter.  Current Outpatient Medications:    predniSONE  (DELTASONE ) 20 MG tablet, Take 2 tablets (40 mg total) by mouth daily for 5 days., Disp: 10 tablet, Rfl: 0   acetaminophen  (TYLENOL ) 325 MG tablet, Take 2 tablets (650 mg total) by mouth every 6 (six) hours as needed for mild pain (pain score 1-3) or fever., Disp: , Rfl:    albuterol  (VENTOLIN  HFA) 108 (90 Base) MCG/ACT inhaler, Inhale 1-2 puffs into the lungs every 6 (six) hours as needed for wheezing or shortness of breath., Disp: 18 g, Rfl: 11   Blood Glucose Monitoring Suppl DEVI, 1 each by Does not apply route in the morning, at noon, and at bedtime. May substitute to any manufacturer covered by patient's insurance., Disp: 1 each, Rfl: 0   budesonide-glycopyrrolate-formoterol (BREZTRI  AEROSPHERE) 160-9-4.8 MCG/ACT AERO inhaler, Inhale 2 puffs into the lungs 2 (two) times daily., Disp: 10.7 g, Rfl: 11   Colchicine  0.6 MG CAPS, At onset of flair, Take 1.2 mg (two tablets). Take 0.6 mg (one tablet) one hour later. Then, take 0.6 mg (one tablet) once daily for up to 7 days as needed for swelling and pain. (Patient not taking: Reported on 02/06/2024), Disp: 30  capsule, Rfl: 0   indomethacin  (INDOCIN ) 50 MG capsule, Take 1 capsule (50 mg total) by mouth 3 (three) times daily as needed for moderate pain (pain score 4-6) (severe pain (8-10))., Disp: 60 capsule, Rfl: 0   losartan  (COZAAR ) 25 MG tablet, Take 0.5 tablets (12.5 mg total) by mouth daily., Disp: 45 tablet, Rfl: 3   metFORMIN  (GLUCOPHAGE -XR) 750 MG 24 hr tablet, Take 750 mg by mouth daily with breakfast., Disp: , Rfl:    rosuvastatin  (CRESTOR ) 5 MG tablet, Take 1 tablet by mouth once daily, Disp: 90 tablet, Rfl: 0   tamsulosin  (FLOMAX ) 0.4 MG CAPS capsule, Take 1 capsule (0.4 mg total) by mouth daily. (Patient not taking: Reported on 02/06/2024), Disp: 30 capsule, Rfl: 3   Allergies  Allergen Reactions   Penicillins Hives    Has patient had a PCN reaction causing immediate rash, facial/tongue/throat swelling, SOB or lightheadedness with hypotension: Yes Has patient had a PCN reaction causing severe rash involving mucus membranes or skin necrosis: No Has patient had a PCN reaction that required hospitalization: No Has patient had a PCN reaction occurring within the last 10 years: No If all of the above answers are NO, then may proceed with Cephalosporin use.  Received Ancef  for surgery on 07/09/23 with no issues.    Past Medical History:  Diagnosis Date   Asthma    Coronary artery disease    MI, old    Type 2 diabetes mellitus with hyperglycemia, without long-term current use of insulin  (HCC) 08/10/2022     Past Surgical  History:  Procedure Laterality Date   HERNIA REPAIR     LEFT HEART CATHETERIZATION WITH CORONARY ANGIOGRAM N/A 07/17/2012   Procedure: LEFT HEART CATHETERIZATION WITH CORONARY ANGIOGRAM;  Surgeon: Erick JONELLE Bergamo, MD;  Location[facility] CATH LAB;  Service: Cardiovascular;  Laterality: N/A;   VENTRAL HERNIA REPAIR N/A 07/09/2023   Procedure: OPEN RIGHT INGUINAL HERNIA REPAIR; DIAGNOSTIC LAPAROSCOPY;  Surgeon: Polly Cordella LABOR, MD;  Location: [facility] ORS;  Service: General;   Laterality: N/A;    Family History  Problem Relation Age of Onset   Diabetes Mellitus II Mother     Social History   Tobacco Use   Smoking status: Every Day    Average packs/day: 0.3 packs/day for 46.6 years (11.7 ttl pk-yrs)    Types: Cigarettes    Start date: 1978    Passive exposure: Never   Smokeless tobacco: Never  Vaping Use   Vaping status: Never Used  Substance Use Topics   Alcohol use: Not Currently   Drug use: Not Currently    ROS Refer to HPI for ROS details.  Objective:   Vitals: BP (!) 137/96 (BP Location: Right Arm)   Pulse (!) 104   Temp 98.3 F (36.8 C) (Oral)   Resp (!) 24   SpO2 96%   Physical Exam Vitals and nursing note reviewed.  Constitutional:      General: He is not in acute distress.    Appearance: He is well-developed. He is not ill-appearing or toxic-appearing.  HENT:     Head: Normocephalic.     Nose: Nose normal. No congestion or rhinorrhea.     Mouth/Throat:     Mouth: Mucous membranes are moist.     Pharynx: No posterior oropharyngeal erythema.  Eyes:     General:        Right eye: No discharge.        Left eye: No discharge.     Extraocular Movements: Extraocular movements intact.     Conjunctiva/sclera: Conjunctivae normal.  Cardiovascular:     Rate and Rhythm: Normal rate and regular rhythm.     Heart sounds: Normal heart sounds. No murmur heard. Pulmonary:     Effort: Pulmonary effort is normal. No respiratory distress.     Breath sounds: No stridor. Wheezing present. No rhonchi or rales.  Chest:     Chest wall: No tenderness.  Skin:    General: Skin is warm and dry.  Neurological:     General: No focal deficit present.     Mental Status: He is alert and oriented to person, place, and time.  Psychiatric:        Mood and Affect: Mood normal.        Behavior: Behavior normal.   Post nebulizer treatment: Patient reports less dyspnea and wheezing has significantly improved from previous  auscultation  Procedures  No results found for this or any previous visit (from the past 24 hours).  No results found.   Assessment and Plan :     Discharge Instructions       1. COPD exacerbation (HCC) (Primary) - ipratropium-albuterol  (DUONEB) 0.5-2.5 (3) MG/3ML nebulizer solution 3 mL given in UC for acute wheezing and COPD exacerbation - predniSONE  (DELTASONE ) 20 MG tablet; Take 2 tablets (40 mg total) by mouth daily for 5 days.  Dispense: 10 tablet; Refill: 0 - Continue to use albuterol  inhaler as needed for any wheezing or dyspnea secondary to current exacerbation -Continue to monitor symptoms for any change in severity if there is  any escalation of current symptoms or development of new symptoms follow-up in ER for further evaluation and management.      Bunny Lowdermilk B Jakson Delpilar   Mckynzie Li[place]ag, [place], [place] 02/21/24 1756

## 2024-02-27 ENCOUNTER — Ambulatory Visit (HOSPITAL_BASED_OUTPATIENT_CLINIC_OR_DEPARTMENT_OTHER): Admission: RE | Admit: 2024-02-27 | Source: Ambulatory Visit

## 2024-04-19 ENCOUNTER — Ambulatory Visit (HOSPITAL_COMMUNITY)
Admission: EM | Admit: 2024-04-19 | Discharge: 2024-04-19 | Disposition: A | Discharge status: Home / Self Care | Attending: Emergency Medicine | Admitting: Emergency Medicine

## 2024-04-19 ENCOUNTER — Telehealth: Payer: Self-pay

## 2024-04-19 ENCOUNTER — Encounter (HOSPITAL_COMMUNITY): Payer: Self-pay | Admitting: Emergency Medicine

## 2024-04-19 DIAGNOSIS — R5383 Other fatigue: Secondary | ICD-10-CM | POA: Diagnosis not present

## 2024-04-19 DIAGNOSIS — R319 Hematuria, unspecified: Secondary | ICD-10-CM | POA: Insufficient documentation

## 2024-04-19 LAB — CBC WITH DIFFERENTIAL/PLATELET
Abs Immature Granulocytes: 0 K/uL (ref 0.00–0.07)
Basophils Absolute: 0 K/uL (ref 0.0–0.1)
Basophils Relative: 1 %
Eosinophils Absolute: 0 K/uL (ref 0.0–0.5)
Eosinophils Relative: 1 %
HCT: 48.8 % (ref 39.0–52.0)
Hemoglobin: 16.7 g/dL (ref 13.0–17.0)
Immature Granulocytes: 0 %
Lymphocytes Relative: 37 %
Lymphs Abs: 1.5 K/uL (ref 0.7–4.0)
MCH: 32.5 pg (ref 26.0–34.0)
MCHC: 34.2 g/dL (ref 30.0–36.0)
MCV: 94.9 fL (ref 80.0–100.0)
Monocytes Absolute: 0.4 K/uL (ref 0.1–1.0)
Monocytes Relative: 9 %
Neutro Abs: 2.1 K/uL (ref 1.7–7.7)
Neutrophils Relative %: 52 %
Platelets: 153 K/uL (ref 150–400)
RBC: 5.14 MIL/uL (ref 4.22–5.81)
RDW: 13.7 % (ref 11.5–15.5)
WBC: 4 K/uL (ref 4.0–10.5)
nRBC: 0 % (ref 0.0–0.2)

## 2024-04-19 LAB — COMPREHENSIVE METABOLIC PANEL WITH GFR
ALT: 26 U/L (ref 0–44)
AST: 27 U/L (ref 15–41)
Albumin: 3.7 g/dL (ref 3.5–5.0)
Alkaline Phosphatase: 67 U/L (ref 38–126)
Anion gap: 11 (ref 5–15)
BUN: 17 mg/dL (ref 8–23)
CO2: 26 mmol/L (ref 22–32)
Calcium: 9.2 mg/dL (ref 8.9–10.3)
Chloride: 98 mmol/L (ref 98–111)
Creatinine, Ser: 0.79 mg/dL (ref 0.61–1.24)
GFR, Estimated: >60 mL/min (ref >60)
Glucose, Bld: 224 mg/dL — ABNORMAL HIGH (ref 70–99)
Potassium: 4.8 mmol/L (ref 3.5–5.1)
Sodium: 135 mmol/L (ref 135–145)
Total Bilirubin: 1.2 mg/dL (ref 0.0–1.2)
Total Protein: 6.6 g/dL (ref 6.5–8.1)

## 2024-04-19 LAB — POCT URINALYSIS DIP (MANUAL ENTRY)
Bilirubin, UA: NEGATIVE
Blood, UA: NEGATIVE
Glucose, UA: >=1000 mg/dL — AB
Ketones, POC UA: NEGATIVE mg/dL
Leukocytes, UA: NEGATIVE
Nitrite, UA: NEGATIVE
Protein Ur, POC: 30 mg/dL — AB
Spec Grav, UA: >=1.03 — AB (ref 1.010–1.025)
Urobilinogen, UA: 0.2 U/dL
pH, UA: 5.5 (ref 5.0–8.0)

## 2024-04-19 LAB — POCT FASTING CBG KUC MANUAL ENTRY: POCT Glucose (KUC): 261 mg/dL — AB (ref 70–99)

## 2024-04-19 NOTE — Telephone Encounter (Addendum)
 Contacted patient to offer him a appointment for him having blood in his stool. Patient confirmed with me the blood is in his urine. He was unable to be seen today due to him being at work patient expressed he will be going to [facility]

## 2024-04-19 NOTE — Telephone Encounter (Signed)
 Can we schedule a OV follow up for hematuria. Pt went to [facility] 04/19/2024

## 2024-04-19 NOTE — ED Provider Notes (Signed)
 [facility]    CSN: [id number] Arrival date & time: 04/19/24  1122      History   Chief Complaint Chief Complaint  Patient presents with   Hematuria    HPI Grayton Lobo is a 63 y.o. male.   Patient presents with concerns for blood in urine that occurred last night.  Patient states that while urinating last night he noticed some blood mixed in with his urine.  Patient denies any blood in his urine today.  Patient states that since then he has had some mild discomfort with urinating.  Patient states that he also woke up this morning feeling full nights rest.  Patient has not reported initially that he was having some right lower back pain as well, but states that this is chronic in nature.  Patient denies any new, fever, body aches, chills, and severe weakness.  Patient also denies abdominal pain, nausea, vomiting, diarrhea, abnormal penile discharge, and penile/testicular pain or swelling.  Patient does have a history of type 2 diabetes and takes metformin  daily for management of this.  Other past medical history includes hypertension, hyperlipidemia, and COPD.  The history is provided by the patient and medical records.  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 11/08/2023   SBO (small bowel obstruction) (HCC) 07/09/2023   Continuous dependence on cigarette smoking 07/09/2023   Essential hypertension 07/09/2023   Hyperlipidemia 07/09/2023   Type 2 diabetes mellitus with proteinuria (HCC) 07/09/2023   Small bowel obstruction (HCC) 07/09/2023   Hyponatremia 07/09/2023   Acute idiopathic gout of right knee 06/28/2023   Pain and swelling of knee, right 06/28/2023   History of MI (myocardial  infarction) 02/09/2023   Nonadherence to medical treatment 10/20/2022   Type 2 diabetes mellitus with hyperglycemia, without long-term current use of insulin  (HCC) 08/10/2022   Chronic cough 07/22/2022   COPD (chronic obstructive pulmonary disease) (HCC) 07/22/2022   Tobacco abuse 05/30/2012    Past Surgical History:  Procedure Laterality Date   HERNIA REPAIR     LEFT HEART CATHETERIZATION WITH CORONARY ANGIOGRAM N/A 07/17/2012   Procedure: LEFT HEART CATHETERIZATION WITH CORONARY ANGIOGRAM;  Surgeon: Erick JONELLE Bergamo, MD;  Location: [facility] CATH LAB;  Service: Cardiovascular;  Laterality: N/A;   VENTRAL HERNIA REPAIR N/A 07/09/2023   Procedure: OPEN RIGHT INGUINAL HERNIA REPAIR; DIAGNOSTIC LAPAROSCOPY;  Surgeon: Polly Cordella LABOR, MD;  Location: [facility];  Service: General;  Laterality: N/A;       Home Medications    Prior to Admission medications   Medication Sig Start Date End Date Taking? Authorizing Provider  acetaminophen  (TYLENOL ) 325 MG tablet Take 2 tablets (650 mg total) by mouth every 6 (six) hours as needed for mild pain (pain score 1-3) or fever. 07/13/23   Vicci Burnard JONELLE, PA-C  albuterol  (VENTOLIN  HFA) 108 (90 Base) MCG/ACT inhaler Inhale 1-2 puffs into the lungs every 6 (six) hours as needed for wheezing or shortness of breath. 07/26/23 07/20/24  Sebastian Beverley NOVAK, MD  Blood Glucose Monitoring Suppl DEVI 1 each by Does not apply route in the morning, at noon, and at bedtime. May substitute to any manufacturer covered by patient's insurance. 07/23/22   Sebastian Beverley NOVAK,  MD  budesonide-glycopyrrolate-formoterol (BREZTRI  AEROSPHERE) 160-9-4.8 MCG/ACT AERO inhaler Inhale 2 puffs into the lungs 2 (two) times daily. 02/06/24   Billy Knee, FNP  Colchicine  0.6 MG CAPS At onset of flair, Take 1.2 mg (two tablets). Take 0.6 mg (one tablet) one hour later. Then, take 0.6 mg (one tablet) once daily for up to 7 days as needed for swelling and pain. Patient not taking: Reported on 02/06/2024  06/28/23   Sebastian Beverley NOVAK, MD  indomethacin  (INDOCIN ) 50 MG capsule Take 1 capsule (50 mg total) by mouth 3 (three) times daily as needed for moderate pain (pain score 4-6) (severe pain (8-10)). 06/29/23   Sebastian Beverley NOVAK, MD  losartan  (COZAAR ) 25 MG tablet Take 0.5 tablets (12.5 mg total) by mouth daily. 02/06/24   Billy Knee, FNP  metFORMIN  (GLUCOPHAGE -XR) 750 MG 24 hr tablet Take 750 mg by mouth daily with breakfast.    [provider]  rosuvastatin  (CRESTOR ) 5 MG tablet Take 1 tablet by mouth once daily 01/25/24   Sebastian Beverley NOVAK, MD  tamsulosin  (FLOMAX ) 0.4 MG CAPS capsule Take 1 capsule (0.4 mg total) by mouth daily. Patient not taking: Reported on 02/06/2024 11/03/23   Sebastian Beverley NOVAK, MD    Family History Family History  Problem Relation Age of Onset   Diabetes Mellitus II Mother     Social History Social History   Tobacco Use   Smoking status: Every Day    Average packs/day: 0.3 packs/day for 46.6 years (11.7 ttl pk-yrs)    Types: Cigarettes    Start date: 1978    Passive exposure: Never   Smokeless tobacco: Never  Vaping Use   Vaping status: Never Used  Substance Use Topics   Alcohol use: Not Currently   Drug use: Not Currently     Allergies   Penicillins   Review of Systems Review of Systems  Genitourinary:  Positive for hematuria.   Per HPI  Physical Exam Triage Vital Signs ED Triage Vitals [04/19/24 1206]  Encounter Vitals Group     BP 128/82     Girls Systolic BP Percentile      Girls Diastolic BP Percentile      Boys Systolic BP Percentile      Boys Diastolic BP Percentile      Pulse Rate 92     Resp 18     Temp 98.2 F (36.8 C)     Temp Source Oral     SpO2 93 %     Weight      Height      Head Circumference      Peak Flow      Pain Score 8     Pain Loc      Pain Education      Exclude from Growth Chart    No data found.  Updated Vital Signs BP 128/82 (BP Location: Right Arm)   Pulse 92   Temp 98.2 F (36.8 C) (Oral)    Resp 18   SpO2 93%   Visual Acuity Right Eye Distance:   Left Eye Distance:   Bilateral Distance:    Right Eye Near:   Left Eye Near:    Bilateral Near:     Physical Exam Vitals and nursing note reviewed.  Constitutional:      General: He is awake. He is not in acute distress.    Appearance: Normal appearance. He is well-developed and well-groomed. He is not ill-appearing.  Cardiovascular:     Rate and Rhythm:  Normal rate and regular rhythm.  Pulmonary:     Effort: Pulmonary effort is normal.     Breath sounds: Normal breath sounds.  Abdominal:     General: Abdomen is flat. Bowel sounds are normal.     Palpations: Abdomen is soft.     Tenderness: There is no abdominal tenderness. There is no right CVA tenderness, left CVA tenderness, guarding or rebound.  Skin:    General: Skin is warm and dry.  Neurological:     General: No focal deficit present.     Mental Status: He is alert and oriented to person, place, and time. Mental status is at baseline.     GCS: GCS eye subscore is 4. GCS verbal subscore is 5. GCS motor subscore is 6.  Psychiatric:        Behavior: Behavior is cooperative.      UC Treatments / Results  Labs (all labs ordered are listed, but only abnormal results are displayed) Labs Reviewed  POCT URINALYSIS DIP (MANUAL ENTRY) - Abnormal; Notable for the following components:      Result Value   Clarity, UA turbid (*)    Glucose, UA >=1,000 (*)    Spec Grav, UA >=1.030 (*)    Protein Ur, POC =30 (*)    All other components within normal limits  POCT FASTING CBG [facility] MANUAL ENTRY - Abnormal; Notable for the following components:   POCT Glucose ([facility]) 261 (*)    All other components within normal limits  URINE CULTURE  CBC WITH DIFFERENTIAL/PLATELET  COMPREHENSIVE METABOLIC PANEL WITH GFR    EKG   Radiology No results found.  Procedures Procedures (including critical care time)  Medications Ordered in UC Medications - No data to  display  Initial Impression / Assessment and Plan / UC Course  I have reviewed the triage vital signs and the nursing notes.  Pertinent labs & imaging results that were available during my care of the patient were reviewed by me and considered in my medical decision making (see chart for details).     Patient is overall well-appearing.  Vitals are stable.  No significant findings on exam.  Urinalysis revealed large amounts of glucose, decreased specific gravity, and small amounts of protein.  There is no presence of blood in the urine or signs of infection at this time.  Will send urine culture to confirm.  Ordered CBC due to presence of large amounts of glucose in the urine.  CBG was elevated at 261 today.  Patient states that he did not take his metformin  today but normally takes this every day.  Ordered CBC and CMP to evaluate blood counts, electrolytes, and kidney function to rule out underlying causes related to symptoms.  Discussed follow-up, return, and strict ER precautions. Final Clinical Impressions(s) / UC Diagnoses   Final diagnoses:  Hematuria, unspecified type  Other fatigue     Discharge Instructions      As discussed your urinalysis does not reveal any signs of blood or infection at this time.  We have sent a urine culture to rule out the presence of a urinary tract infection.  These results will return over the next few days and someone will call if results require treatment. Your urinalysis did reveal a large amount of glucose today which could be related to medications that you take for your diabetes.  We checked your blood sugar in clinic today and it was slightly elevated.  We have drawn some labs to rule out underlying  causes related to your fatigue, check your blood counts, and evaluate your kidney function.  If any of these results are concerning someone will call and advise treatment at that time. Recommend following up with your primary care provider if you have  continued or recurrent symptoms as well as further management of your diabetes. You can also follow-up with [facility] if you continue to have urinary concerns for further evaluation as well. If you develop large amounts of blood in your urine, severe flank pain, severe abdominal pain, excessive vomiting, fever, severe weakness, or passing out please seek immediate medical treatment in the emergency department.     ED Prescriptions   None    PDMP not reviewed this encounter.   Johnie Flaming A, NP 04/19/24 1254

## 2024-04-19 NOTE — Telephone Encounter (Signed)
-----   Message from Terrill B sent at 04/19/2024  7:57 AM EDT ----- Access nurse

## 2024-04-19 NOTE — Discharge Instructions (Addendum)
 As discussed your urinalysis does not reveal any signs of blood or infection at this time.  We have sent a urine culture to rule out the presence of a urinary tract infection.  These results will return over the next few days and someone will call if results require treatment. Your urinalysis did reveal a large amount of glucose today which could be related to medications that you take for your diabetes.  We checked your blood sugar in clinic today and it was slightly elevated.  We have drawn some labs to rule out underlying causes related to your fatigue, check your blood counts, and evaluate your kidney function.  If any of these results are concerning someone will call and advise treatment at that time. Recommend following up with your primary care provider if you have continued or recurrent symptoms as well as further management of your diabetes. You can also follow-up with [facility] if you continue to have urinary concerns for further evaluation as well. If you develop large amounts of blood in your urine, severe flank pain, severe abdominal pain, excessive vomiting, fever, severe weakness, or passing out please seek immediate medical treatment in the emergency department.

## 2024-04-19 NOTE — ED Triage Notes (Signed)
 Pt reports last night had blood in urine, none today. Pt reports today feeling weak and has no energy to do anything and having lower back pain. Hasn't taken any medications for symptoms.

## 2024-04-20 ENCOUNTER — Ambulatory Visit (HOSPITAL_COMMUNITY): Payer: Self-pay

## 2024-04-20 LAB — URINE CULTURE

## 2024-04-20 NOTE — Telephone Encounter (Signed)
 Noted

## 2024-04-30 ENCOUNTER — Encounter: Payer: Self-pay | Admitting: Nurse Practitioner

## 2024-04-30 ENCOUNTER — Ambulatory Visit: Admitting: Nurse Practitioner

## 2024-04-30 VITALS — BP 118/76 | HR 107 | Temp 97.9°F | Ht 72.0 in | Wt 186.0 lb

## 2024-04-30 DIAGNOSIS — Z7984 Long term (current) use of oral hypoglycemic drugs: Secondary | ICD-10-CM | POA: Diagnosis not present

## 2024-04-30 DIAGNOSIS — Z87448 Personal history of other diseases of urinary system: Secondary | ICD-10-CM

## 2024-04-30 DIAGNOSIS — E1165 Type 2 diabetes mellitus with hyperglycemia: Secondary | ICD-10-CM | POA: Diagnosis not present

## 2024-04-30 LAB — POCT URINALYSIS DIPSTICK
Bilirubin, UA: NEGATIVE
Blood, UA: NEGATIVE
Glucose, UA: POSITIVE — AB
Ketones, UA: NEGATIVE
Leukocytes, UA: NEGATIVE
Nitrite, UA: NEGATIVE
Protein, UA: POSITIVE — AB
Spec Grav, UA: 1.015 (ref 1.010–1.025)
Urobilinogen, UA: 0.2 U/dL
pH, UA: 6 (ref 5.0–8.0)

## 2024-04-30 LAB — POCT GLYCOSYLATED HEMOGLOBIN (HGB A1C)
HbA1c POC (<> result, manual entry): 10.5 % (ref 4.0–5.6)
HbA1c, POC (controlled diabetic range): 10.5 % — AB (ref 0.0–7.0)
HbA1c, POC (prediabetic range): 10.5 % — AB (ref 5.7–6.4)
Hemoglobin A1C: 10.5 % — AB (ref 4.0–5.6)

## 2024-04-30 MED ORDER — EMPAGLIFLOZIN 10 MG PO TABS: 10.0000 mg | ORAL_TABLET | Freq: Every day | ORAL | 1 refills | Status: DC

## 2024-04-30 NOTE — Progress Notes (Unsigned)
 Established Patient Office Visit  Subjective   Patient ID: Sean Martinez, male    DOB: 1961/03/05  Age: 63 y.o. MRN: [id number]  Chief Complaint  Patient presents with   Hematuria    Santina to urgent care on 04/19/24 with blood in urine, concerns with cramps in hands for 1 week    HPI Discussed the use of AI scribe software for clinical note transcription with the patient, who gave verbal consent to proceed.  History of Present Illness   Sean Martinez is a 63 year old male with diabetes who presents with blood in his urine.  He experienced hematuria once prior to this visit, with no recurrence since. He denies abdominal pain or dysuria but occasionally feels pressure at the start of urination, which resolves as he continues. He has increased his water intake since the initial episode. He went to urgent care and did not have a UTI or blood in urine, but did have protein.   He manages diabetes with metformin , taken once daily at night. He no longer checks his blood sugar at home but recalls a recent reading of 249 mg/dL at urgent care after consuming cookies. He has a fondness for butter cookies and has not been on any other diabetic medications besides metformin .  He experiences hand cramping, typically when skipping lunch at work, and associates this with not eating. He also experiences hunger headaches. He has been trying to reduce his soda intake, now consuming a regular Pepsi every three days, and has increased his consumption of salads and protein shakes. He denies chest pain or shortness of breath.       ROS See pertinent positives and negatives per HPI.    Objective:     BP 118/76 (BP Location: Left Arm, Patient Position: Sitting, Cuff Size: Normal)   Pulse (!) 107   Temp 97.9 F (36.6 C)   Ht 6' (1.829 m)   Wt 186 lb (84.4 kg)   SpO2 98%   BMI 25.23 kg/m    Physical Exam Vitals and nursing note reviewed.  Constitutional:      Appearance: Normal  appearance.  HENT:     Head: Normocephalic.  Eyes:     Conjunctiva/sclera: Conjunctivae normal.  Cardiovascular:     Rate and Rhythm: Normal rate and regular rhythm.     Pulses: Normal pulses.     Heart sounds: Normal heart sounds.  Pulmonary:     Effort: Pulmonary effort is normal.     Breath sounds: Normal breath sounds.  Musculoskeletal:     Cervical back: Normal range of motion.  Skin:    General: Skin is warm.  Neurological:     General: No focal deficit present.     Mental Status: He is alert and oriented to person, place, and time.  Psychiatric:        Mood and Affect: Mood normal.        Behavior: Behavior normal.        Thought Content: Thought content normal.        Judgment: Judgment normal.     Results for orders placed or performed in visit on 04/30/24  POCT glycosylated hemoglobin (Hb A1C)  Result Value Ref Range   Hemoglobin A1C 10.5 (A) 4.0 - 5.6 %   HbA1c POC (<> result, manual entry) 10.5 4.0 - 5.6 %   HbA1c, POC (prediabetic range) 10.5 (A) 5.7 - 6.4 %   HbA1c, POC (controlled diabetic range) 10.5 (A) 0.0 - 7.0 %  POCT urinalysis dipstick  Result Value Ref Range   Color, UA     Clarity, UA     Glucose, UA Positive (A) Negative   Bilirubin, UA Negative    Ketones, UA Negative    Spec Grav, UA 1.015 1.010 - 1.025   Blood, UA Negative    pH, UA 6.0 5.0 - 8.0   Protein, UA Positive (A) Negative   Urobilinogen, UA 0.2 0.2 or 1.0 E.U./dL   Nitrite, UA Negative    Leukocytes, UA Negative Negative   Appearance     Odor        Assessment & Plan:   Problem List Items Addressed This Visit       Endocrine   Type 2 diabetes mellitus with hyperglycemia, without long-term current use of insulin  (HCC) - Primary   Chronic, not controlled. A1c at 10.5% indicates poor control, with proteinuria and glucosuria suggesting kidney involvement. Discussed risks of uncontrolled diabetes, including potential kidney damage and dialysis. Started Jardiance 10mg  daily  for glycemic control, renal protection, and weight loss, alongside metformin  750mg  daily. Increased water intake is encouraged. Advised to check blood glucose weekly, focusing on morning fasting levels. Follow-up with PCP in 4-6 weeks.       Relevant Medications   empagliflozin (JARDIANCE) 10 MG TABS tablet   Other Relevant Orders   POCT glycosylated hemoglobin (Hb A1C) (Completed)   Other Visit Diagnoses       History of hematuria       U/A negative for blood. no current symptoms. Follow-up if this happens again.   Relevant Orders   POCT urinalysis dipstick (Completed)       Return in about 4 weeks (around 05/28/2024) for 4-6 weeks , Diabetes with Dr. Sebastian .    Tinnie DELENA Harada, NP

## 2024-04-30 NOTE — Patient Instructions (Addendum)
 It was great to see you!  Keep drinking plenty of water  Start jardiance 1 tablet daily in addition to the metformin    You can try having a shake for lunch to help with the hand cramping   Start checking your sugars again in the morning before you eat   Let's follow-up in 4-6 weeks, sooner if you have concerns.  If a referral was placed today, you will be contacted for an appointment. Please note that routine referrals can sometimes take up to 3-4 weeks to process. Please call our office if you haven't heard anything after this time frame.  Take care,  Tinnie Harada, NP

## 2024-05-01 NOTE — Assessment & Plan Note (Signed)
 Chronic, not controlled. A1c at 10.5% indicates poor control, with proteinuria and glucosuria suggesting kidney involvement. Discussed risks of uncontrolled diabetes, including potential kidney damage and dialysis. Started Jardiance 10mg  daily for glycemic control, renal protection, and weight loss, alongside metformin  750mg  daily. Increased water intake is encouraged. Advised to check blood glucose weekly, focusing on morning fasting levels. Follow-up with PCP in 4-6 weeks.

## 2024-06-07 ENCOUNTER — Encounter: Admitting: Internal Medicine

## 2024-06-15 ENCOUNTER — Ambulatory Visit (HOSPITAL_COMMUNITY): Payer: Self-pay | Admitting: Physician Assistant

## 2024-06-15 ENCOUNTER — Ambulatory Visit (HOSPITAL_COMMUNITY)
Admission: EM | Admit: 2024-06-15 | Discharge: 2024-06-15 | Disposition: A | Discharge status: Home / Self Care | Attending: Physician Assistant | Admitting: Physician Assistant

## 2024-06-15 ENCOUNTER — Ambulatory Visit (HOSPITAL_COMMUNITY)

## 2024-06-15 ENCOUNTER — Encounter (HOSPITAL_COMMUNITY): Payer: Self-pay

## 2024-06-15 DIAGNOSIS — J441 Chronic obstructive pulmonary disease with (acute) exacerbation: Secondary | ICD-10-CM

## 2024-06-15 DIAGNOSIS — R051 Acute cough: Secondary | ICD-10-CM

## 2024-06-15 LAB — GLUCOSE, POCT (MANUAL RESULT ENTRY): POCT Glucose (KUC): 115 mg/dL — AB (ref 70–99)

## 2024-06-15 LAB — POC COVID19/FLU A&B COMBO
Covid Antigen, POC: NEGATIVE
Influenza A Antigen, POC: NEGATIVE
Influenza B Antigen, POC: NEGATIVE

## 2024-06-15 MED ORDER — PREDNISONE 20 MG PO TABS: 20.0000 mg | ORAL_TABLET | Freq: Every day | ORAL | 0 refills | Status: AC

## 2024-06-15 MED ORDER — DOXYCYCLINE HYCLATE 100 MG PO CAPS: 100.0000 mg | ORAL_CAPSULE | Freq: Two times a day (BID) | ORAL | 0 refills | Status: DC

## 2024-06-15 MED ORDER — IPRATROPIUM-ALBUTEROL 0.5-2.5 (3) MG/3ML IN SOLN
RESPIRATORY_TRACT | Status: AC
  Filled 2024-06-15: qty 3

## 2024-06-15 MED ORDER — IPRATROPIUM-ALBUTEROL 0.5-2.5 (3) MG/3ML IN SOLN
3.0000 mL | Freq: Once | RESPIRATORY_TRACT | Status: AC
  Administered 2024-06-15: 3 mL via RESPIRATORY_TRACT

## 2024-06-15 NOTE — ED Triage Notes (Signed)
 Patient presents to the office for cough and headache x 2 days. Patient states he has a history of COPD.

## 2024-06-15 NOTE — Discharge Instructions (Signed)
 I did not see a pneumonia on your x-ray but we will contact you if the radiologist sees something that I did not.  I am concerned that you are having a COPD exacerbation.  You tested negative for COVID and flu.  Start doxycycline  100 mg twice daily for 10 days.  Stay out of the sun while on this medication.  I would like you to start a little bit of prednisone  (20 mg for 5 days).  This will raise your blood sugars to make sure that you avoid carbohydrates and drink plenty of fluid while on this medication.  If you have very elevated blood sugar, nausea, vomiting, shortness of breath, weakness you need to be seen immediately.  Continue your albuterol  and breathing medications as previously prescribed.  Follow-up with your primary care within a few days for recheck.  If anything worsens and you have high fever, worsening cough, shortness of breath, chest pain, nausea/vomiting, weakness you need to be seen immediately.

## 2024-06-15 NOTE — ED Provider Notes (Signed)
 " [facility]    CSN: [id number] Arrival date & time: 06/15/24  1039      History   Chief Complaint Chief Complaint  Patient presents with   Cough   Headache    HPI Sean Martinez is a 63 y.o. male.   Patient today with a 3-day history of URI symptoms.  He reports significant cough, increased sputum production, shortness of breath, headache, sore throat.  Denies any chest pain, body aches, measured fever, weakness.  He does have a history of COPD and takes Breztri  as maintenance which he reports compliance with; has not missed any doses.  He has been using his albuterol  more frequently which provides temporary improvement of symptoms.  Denies any known sick contacts.  He has not had COVID recently.  He did have initial COVID-19 vaccinations but has not had any recent boosters.  Denies any recent antibiotics or steroids.  He has been taking over-the-counter cold and flu medicine without improvement of symptoms.  He does have a history of diabetes and his last A1c obtained 04/30/2024 was elevated at 10.5%.    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 11/08/2023   SBO (small bowel obstruction) (HCC) 07/09/2023   Continuous dependence on cigarette smoking 07/09/2023   Essential hypertension 07/09/2023   Hyperlipidemia 07/09/2023   Type 2 diabetes mellitus with proteinuria (HCC) 07/09/2023   Small bowel obstruction (HCC) 07/09/2023   Hyponatremia 07/09/2023   Acute idiopathic gout of right knee 06/28/2023   Pain and swelling of knee, right 06/28/2023   History of MI (myocardial infarction) 02/09/2023   Nonadherence to medical treatment 10/20/2022   Type 2 diabetes mellitus with hyperglycemia, without long-term current use  of insulin  (HCC) 08/10/2022   Chronic cough 07/22/2022   COPD (chronic obstructive pulmonary disease) (HCC) 07/22/2022   Tobacco abuse 05/30/2012    Past Surgical History:  Procedure Laterality Date   HERNIA REPAIR     LEFT HEART CATHETERIZATION WITH CORONARY ANGIOGRAM N/A 07/17/2012   Procedure: LEFT HEART CATHETERIZATION WITH CORONARY ANGIOGRAM;  Surgeon: Erick JONELLE Bergamo, MD;  Location: [facility] CATH LAB;  Service: Cardiovascular;  Laterality: N/A;   VENTRAL HERNIA REPAIR N/A 07/09/2023   Procedure: OPEN RIGHT INGUINAL HERNIA REPAIR; DIAGNOSTIC LAPAROSCOPY;  Surgeon: Polly Cordella LABOR, MD;  Location: [facility] ORS;  Service: General;  Laterality: N/A;       Home Medications    Prior to Admission medications  Medication Sig Start Date End Date Taking? Authorizing Provider  acetaminophen  (TYLENOL ) 325 MG tablet Take 2 tablets (650 mg total) by mouth every 6 (six) hours as needed for mild pain (pain score 1-3) or fever. 07/13/23  Yes Vicci Burnard JONELLE, PA-C  albuterol  (VENTOLIN  HFA) 108 (90 Base) MCG/ACT inhaler Inhale 1-2 puffs into the lungs every 6 (six) hours as needed for wheezing or shortness of breath. 07/26/23 07/20/24 Yes Sebastian Beverley NOVAK, MD  Blood Glucose Monitoring Suppl DEVI 1 each by Does not apply route in the morning, at noon, and at bedtime. May substitute to any manufacturer covered by patient's insurance. 07/23/22  Yes Sebastian Beverley NOVAK, MD  budesonide-glycopyrrolate-formoterol (BREZTRI  AEROSPHERE) 160-9-4.8 MCG/ACT AERO inhaler Inhale 2 puffs into the lungs 2 (two) times daily. 02/06/24  Yes Billy Knee, FNP  doxycycline  (VIBRAMYCIN ) 100 MG capsule Take 1 capsule (100 mg total) by mouth 2 (two) times daily. 06/15/24  Yes Francess Mullen K, PA-C  indomethacin  (INDOCIN ) 50 MG capsule Take 1 capsule (50 mg total) by mouth 3 (three) times daily as needed for moderate pain (pain score 4-6) (severe pain (8-10)). 06/29/23  Yes Sebastian Beverley NOVAK, MD  losartan  (COZAAR ) 25 MG tablet Take 0.5 tablets  (12.5 mg total) by mouth daily. 02/06/24  Yes Billy Knee, FNP  metFORMIN  (GLUCOPHAGE -XR) 750 MG 24 hr tablet Take 750 mg by mouth daily with breakfast.   Yes [provider]  predniSONE  (DELTASONE ) 20 MG tablet Take 1 tablet (20 mg total) by mouth daily for 5 days. 06/15/24 06/20/24 Yes Yezenia Fredrick K, PA-C  rosuvastatin  (CRESTOR ) 5 MG tablet Take 1 tablet by mouth once daily 01/25/24  Yes Sebastian Beverley NOVAK, MD  tamsulosin  (FLOMAX ) 0.4 MG CAPS capsule Take 1 capsule (0.4 mg total) by mouth daily. 11/03/23  Yes Sebastian Beverley NOVAK, MD  Colchicine  0.6 MG CAPS At onset of flair, Take 1.2 mg (two tablets). Take 0.6 mg (one tablet) one hour later. Then, take 0.6 mg (one tablet) once daily for up to 7 days as needed for swelling and pain. 06/28/23   Sebastian Beverley NOVAK, MD  empagliflozin  (JARDIANCE ) 10 MG TABS tablet Take 1 tablet (10 mg total) by mouth daily. 04/30/24   McElwee, Tinnie LABOR, NP    Family History Family History  Problem Relation Age of Onset   Diabetes Mellitus II Mother     Social History Social History[1]   Allergies   Penicillins   Review of Systems Review of Systems  Constitutional:  Positive for activity change. Negative for appetite change, fatigue and fever.  HENT:  Positive for congestion and sore throat. Negative for sinus pressure and sneezing.   Respiratory:  Positive for cough and shortness of breath.   Cardiovascular:  Negative for chest pain.  Gastrointestinal:  Negative for abdominal pain, diarrhea, nausea and vomiting.  Musculoskeletal:  Negative for arthralgias and myalgias.  Neurological:  Positive for headaches. Negative for dizziness and light-headedness.     Physical Exam Triage Vital Signs ED Triage Vitals [06/15/24 1251]  Encounter Vitals Group     BP 122/80     Girls Systolic BP Percentile      Girls Diastolic BP Percentile      Boys Systolic BP Percentile      Boys Diastolic BP Percentile      Pulse Rate 95     Resp 18     Temp 99.6 F  (37.6 C)     Temp Source Oral     SpO2 95 %     Weight      Height      Head Circumference      Peak Flow      Pain Score      Pain Loc      Pain Education      Exclude from Growth Chart    No data found.  Updated Vital Signs BP 122/80 (BP Location: Left Arm)   Pulse 95   Temp 99.6 F (37.6 C) (Oral)   Resp 18   SpO2 95%   Visual Acuity Right Eye Distance:   Left Eye Distance:   Bilateral Distance:    Right Eye Near:   Left Eye Near:    Bilateral Near:     Physical Exam Vitals reviewed.  Constitutional:  General: He is awake.     Appearance: Normal appearance. He is well-developed. He is not ill-appearing.     Comments: Very pleasant male appears stated age in no acute distress sitting comfortably in exam room  HENT:     Head: Normocephalic and atraumatic.     Right Ear: Tympanic membrane, ear canal and external ear normal. Tympanic membrane is not erythematous or bulging.     Left Ear: Tympanic membrane, ear canal and external ear normal. Tympanic membrane is not erythematous or bulging.     Nose: Nose normal.     Mouth/Throat:     Pharynx: Uvula midline. Posterior oropharyngeal erythema present. No oropharyngeal exudate or uvula swelling.  Cardiovascular:     Rate and Rhythm: Normal rate and regular rhythm.     Heart sounds: Normal heart sounds, S1 normal and S2 normal. No murmur heard. Pulmonary:     Effort: Pulmonary effort is normal. No accessory muscle usage or respiratory distress.     Breath sounds: No stridor. Wheezing present. No rhonchi or rales.     Comments: Widespread wheezing with reactive cough when taking deep breaths. Neurological:     Mental Status: He is alert.  Psychiatric:        Behavior: Behavior is cooperative.      UC Treatments / Results  Labs (all labs ordered are listed, but only abnormal results are displayed) Labs Reviewed  GLUCOSE, POCT (MANUAL RESULT ENTRY) - Abnormal; Notable for the following components:       Result Value   POCT Glucose ([facility]) 115 (*)    All other components within normal limits  POC COVID19/FLU A&B COMBO    EKG   Radiology DG Chest 2 View Result Date: 06/15/2024 CLINICAL DATA:  Cough for 2 days EXAM: CHEST - 2 VIEW COMPARISON:  December 14, 2022 FINDINGS: The heart size and mediastinal contours are within normal limits. Both lungs are clear. The visualized skeletal structures are unremarkable. IMPRESSION: No active cardiopulmonary disease. Electronically Signed   By: Lynwood Landy Raddle M.D.   On: 06/15/2024 15:13    Procedures Procedures (including critical care time)  Medications Ordered in UC Medications  ipratropium-albuterol  (DUONEB) 0.5-2.5 (3) MG/3ML nebulizer solution 3 mL (3 mLs Nebulization Given 06/15/24 1408)    Initial Impression / Assessment and Plan / UC Course  I have reviewed the triage vital signs and the nursing notes.  Pertinent labs & imaging results that were available during my care of the patient were reviewed by me and considered in my medical decision making (see chart for details).     Patient is well-appearing, afebrile, nontoxic, nontachycardic.  He did have wheezing on exam and so was given DuoNeb in clinic with improvement of symptoms.  COVID and flu testing was negative.  I am concerned for a COPD exacerbation given his clinical presentation.  Chest x-ray was obtained that showed no acute cardiopulmonary disease based on my primary read.  We were waiting for radiologist overread at the time of discharge and we will contact him if this differs and changes our treatment plan.  He does have a history of uncontrolled diabetes and so we discussed risks and benefits of starting short course of prednisone .  His random glucose in clinic today was appropriate at 115 and so we will use a very low dose for short period of time (20 mg for 5 days).  We discussed that he is not to take this with NSAIDs and that this can cause hyperglycemia so  he should push fluids  and avoid carbohydrates.  If he has any signs of significant hyperglycemia he is to stop the medication and be seen immediately.  He was also started on doxycycline  to cover for COPD exacerbation given his history of hives with penicillin.  He is to avoid prolonged sun exposure on this medication.  Recommend he continue over-the-counter medications such as Mucinex , Flonase, nasal saline or sinus rinses.  He is to take albuterol  and Breztri  as previously prescribed.  Recommend close follow-up with his primary care.  If he is not feeling better within a week or if anything worsens he needs to be seen immediately.  Return precautions given.  Excuse note provided.  Final Clinical Impressions(s) / UC Diagnoses   Final diagnoses:  Acute cough  COPD exacerbation (HCC)     Discharge Instructions      I did not see a pneumonia on your x-ray but we will contact you if the radiologist sees something that I did not.  I am concerned that you are having a COPD exacerbation.  You tested negative for COVID and flu.  Start doxycycline  100 mg twice daily for 10 days.  Stay out of the sun while on this medication.  I would like you to start a little bit of prednisone  (20 mg for 5 days).  This will raise your blood sugars to make sure that you avoid carbohydrates and drink plenty of fluid while on this medication.  If you have very elevated blood sugar, nausea, vomiting, shortness of breath, weakness you need to be seen immediately.  Continue your albuterol  and breathing medications as previously prescribed.  Follow-up with your primary care within a few days for recheck.  If anything worsens and you have high fever, worsening cough, shortness of breath, chest pain, nausea/vomiting, weakness you need to be seen immediately.     ED Prescriptions     Medication Sig Dispense Auth. Provider   doxycycline  (VIBRAMYCIN ) 100 MG capsule Take 1 capsule (100 mg total) by mouth 2 (two) times daily. 20 capsule Sharlot Sturkey K,  PA-C   predniSONE  (DELTASONE ) 20 MG tablet Take 1 tablet (20 mg total) by mouth daily for 5 days. 5 tablet Raistlin Gum K, PA-C      PDMP not reviewed this encounter.     [1]  Social History Tobacco Use   Smoking status: Every Day    Average packs/day: 0.3 packs/day for 46.6 years (11.7 ttl pk-yrs)    Types: Cigarettes    Start date: 1978    Passive exposure: Never   Smokeless tobacco: Never  Vaping Use   Vaping status: Never Used  Substance Use Topics   Alcohol use: Not Currently   Drug use: Not Currently     Sherrell Rocky POUR, PA-C 06/15/24 1517  "

## 2024-06-22 ENCOUNTER — Other Ambulatory Visit: Payer: Self-pay

## 2024-06-22 ENCOUNTER — Emergency Department (HOSPITAL_COMMUNITY)

## 2024-06-22 ENCOUNTER — Inpatient Hospital Stay (HOSPITAL_COMMUNITY)
Admission: EM | Admit: 2024-06-22 | Discharge: 2024-06-26 | DRG: 871 | Disposition: A | Discharge status: Home / Self Care | Attending: Internal Medicine | Admitting: Internal Medicine

## 2024-06-22 DIAGNOSIS — I152 Hypertension secondary to endocrine disorders: Secondary | ICD-10-CM | POA: Diagnosis present

## 2024-06-22 DIAGNOSIS — Z79899 Other long term (current) drug therapy: Secondary | ICD-10-CM | POA: Diagnosis not present

## 2024-06-22 DIAGNOSIS — E1159 Type 2 diabetes mellitus with other circulatory complications: Secondary | ICD-10-CM | POA: Diagnosis present

## 2024-06-22 DIAGNOSIS — E1129 Type 2 diabetes mellitus with other diabetic kidney complication: Secondary | ICD-10-CM | POA: Diagnosis present

## 2024-06-22 DIAGNOSIS — J9601 Acute respiratory failure with hypoxia: Secondary | ICD-10-CM | POA: Diagnosis present

## 2024-06-22 DIAGNOSIS — A419 Sepsis, unspecified organism: Secondary | ICD-10-CM | POA: Diagnosis present

## 2024-06-22 DIAGNOSIS — F17210 Nicotine dependence, cigarettes, uncomplicated: Secondary | ICD-10-CM | POA: Diagnosis present

## 2024-06-22 DIAGNOSIS — I252 Old myocardial infarction: Secondary | ICD-10-CM | POA: Diagnosis not present

## 2024-06-22 DIAGNOSIS — Z7984 Long term (current) use of oral hypoglycemic drugs: Secondary | ICD-10-CM | POA: Diagnosis not present

## 2024-06-22 DIAGNOSIS — Z1152 Encounter for screening for COVID-19: Secondary | ICD-10-CM | POA: Diagnosis not present

## 2024-06-22 DIAGNOSIS — Z716 Tobacco abuse counseling: Secondary | ICD-10-CM

## 2024-06-22 DIAGNOSIS — J151 Pneumonia due to Pseudomonas: Secondary | ICD-10-CM | POA: Diagnosis present

## 2024-06-22 DIAGNOSIS — R808 Other proteinuria: Secondary | ICD-10-CM | POA: Diagnosis present

## 2024-06-22 DIAGNOSIS — E785 Hyperlipidemia, unspecified: Secondary | ICD-10-CM | POA: Diagnosis present

## 2024-06-22 DIAGNOSIS — J440 Chronic obstructive pulmonary disease with acute lower respiratory infection: Secondary | ICD-10-CM | POA: Diagnosis present

## 2024-06-22 DIAGNOSIS — Z7951 Long term (current) use of inhaled steroids: Secondary | ICD-10-CM

## 2024-06-22 DIAGNOSIS — J189 Pneumonia, unspecified organism: Secondary | ICD-10-CM

## 2024-06-22 DIAGNOSIS — B965 Pseudomonas (aeruginosa) (mallei) (pseudomallei) as the cause of diseases classified elsewhere: Secondary | ICD-10-CM | POA: Diagnosis present

## 2024-06-22 DIAGNOSIS — I2510 Atherosclerotic heart disease of native coronary artery without angina pectoris: Secondary | ICD-10-CM | POA: Diagnosis present

## 2024-06-22 DIAGNOSIS — Z833 Family history of diabetes mellitus: Secondary | ICD-10-CM

## 2024-06-22 DIAGNOSIS — Z880 Allergy status to penicillin: Secondary | ICD-10-CM

## 2024-06-22 DIAGNOSIS — J441 Chronic obstructive pulmonary disease with (acute) exacerbation: Secondary | ICD-10-CM | POA: Diagnosis present

## 2024-06-22 DIAGNOSIS — R0600 Dyspnea, unspecified: Principal | ICD-10-CM

## 2024-06-22 DIAGNOSIS — J188 Other pneumonia, unspecified organism: Principal | ICD-10-CM | POA: Diagnosis present

## 2024-06-22 LAB — CBC
HCT: 48.3 % (ref 39.0–52.0)
Hemoglobin: 16.7 g/dL (ref 13.0–17.0)
MCH: 32.5 pg (ref 26.0–34.0)
MCHC: 34.6 g/dL (ref 30.0–36.0)
MCV: 94 fL (ref 80.0–100.0)
Platelets: 199 K/uL (ref 150–400)
RBC: 5.14 MIL/uL (ref 4.22–5.81)
RDW: 12.9 % (ref 11.5–15.5)
WBC: 13.5 K/uL — ABNORMAL HIGH (ref 4.0–10.5)
nRBC: 0 % (ref 0.0–0.2)

## 2024-06-22 LAB — BASIC METABOLIC PANEL WITH GFR
Anion gap: 14 (ref 5–15)
BUN: 16 mg/dL (ref 8–23)
CO2: 23 mmol/L (ref 22–32)
Calcium: 9.6 mg/dL (ref 8.9–10.3)
Chloride: 101 mmol/L (ref 98–111)
Creatinine, Ser: 0.73 mg/dL (ref 0.61–1.24)
GFR, Estimated: >60 mL/min
Glucose, Bld: 148 mg/dL — ABNORMAL HIGH (ref 70–99)
Potassium: 3.9 mmol/L (ref 3.5–5.1)
Sodium: 139 mmol/L (ref 135–145)

## 2024-06-22 LAB — RESP PANEL BY RT-PCR (RSV, FLU A&B, COVID)  RVPGX2
Influenza A by PCR: NEGATIVE
Influenza B by PCR: NEGATIVE
Resp Syncytial Virus by PCR: NEGATIVE
SARS Coronavirus 2 by RT PCR: NEGATIVE

## 2024-06-22 LAB — EXPECTORATED SPUTUM ASSESSMENT W GRAM STAIN, RFLX TO RESP C

## 2024-06-22 LAB — GLUCOSE, CAPILLARY: Glucose-Capillary: 271 mg/dL — ABNORMAL HIGH (ref 70–99)

## 2024-06-22 MED ORDER — INSULIN ASPART 100 UNIT/ML IJ SOLN
0.0000 [IU] | Freq: Three times a day (TID) | INTRAMUSCULAR | Status: DC
  Administered 2024-06-23: 3 [IU] via SUBCUTANEOUS
  Administered 2024-06-23: 2 [IU] via SUBCUTANEOUS
  Administered 2024-06-23: 5 [IU] via SUBCUTANEOUS
  Administered 2024-06-24: 3 [IU] via SUBCUTANEOUS
  Administered 2024-06-24: 5 [IU] via SUBCUTANEOUS
  Administered 2024-06-24: 7 [IU] via SUBCUTANEOUS
  Administered 2024-06-25 (×2): 3 [IU] via SUBCUTANEOUS
  Administered 2024-06-26: 1 [IU] via SUBCUTANEOUS
  Administered 2024-06-26: 2 [IU] via SUBCUTANEOUS
  Filled 2024-06-22: qty 3
  Filled 2024-06-22: qty 7
  Filled 2024-06-22: qty 2
  Filled 2024-06-22: qty 1
  Filled 2024-06-22: qty 3
  Filled 2024-06-22: qty 2
  Filled 2024-06-22: qty 3
  Filled 2024-06-22: qty 5
  Filled 2024-06-22: qty 3
  Filled 2024-06-22: qty 5

## 2024-06-22 MED ORDER — IPRATROPIUM-ALBUTEROL 0.5-2.5 (3) MG/3ML IN SOLN
3.0000 mL | Freq: Once | RESPIRATORY_TRACT | Status: AC
  Administered 2024-06-22: 3 mL via RESPIRATORY_TRACT
  Filled 2024-06-22: qty 3

## 2024-06-22 MED ORDER — INSULIN ASPART 100 UNIT/ML IJ SOLN
0.0000 [IU] | Freq: Every day | INTRAMUSCULAR | Status: DC
  Administered 2024-06-22 – 2024-06-23 (×2): 3 [IU] via SUBCUTANEOUS
  Administered 2024-06-24: 2 [IU] via SUBCUTANEOUS
  Administered 2024-06-25: 4 [IU] via SUBCUTANEOUS
  Filled 2024-06-22: qty 4
  Filled 2024-06-22: qty 2
  Filled 2024-06-22 (×2): qty 3

## 2024-06-22 MED ORDER — ACETAMINOPHEN 650 MG RE SUPP: 650.0000 mg | Freq: Four times a day (QID) | RECTAL | Status: DC | PRN

## 2024-06-22 MED ORDER — GUAIFENESIN ER 600 MG PO TB12
600.0000 mg | ORAL_TABLET | Freq: Two times a day (BID) | ORAL | Status: DC
  Administered 2024-06-22 – 2024-06-26 (×8): 600 mg via ORAL
  Filled 2024-06-22 (×8): qty 1

## 2024-06-22 MED ORDER — MELATONIN 5 MG PO TABS
5.0000 mg | ORAL_TABLET | Freq: Every evening | ORAL | Status: AC | PRN
  Administered 2024-06-22 – 2024-06-24 (×3): 5 mg via ORAL
  Filled 2024-06-22 (×3): qty 1

## 2024-06-22 MED ORDER — METHYLPREDNISOLONE SODIUM SUCC 125 MG IJ SOLR
125.0000 mg | Freq: Once | INTRAMUSCULAR | Status: AC
  Administered 2024-06-22: 125 mg via INTRAVENOUS
  Filled 2024-06-22: qty 2

## 2024-06-22 MED ORDER — BUDESONIDE 0.25 MG/2ML IN SUSP
0.2500 mg | Freq: Two times a day (BID) | RESPIRATORY_TRACT | Status: DC
  Administered 2024-06-22 – 2024-06-26 (×8): 0.25 mg via RESPIRATORY_TRACT
  Filled 2024-06-22 (×10): qty 2

## 2024-06-22 MED ORDER — LOSARTAN POTASSIUM 25 MG PO TABS
12.5000 mg | ORAL_TABLET | Freq: Every day | ORAL | Status: DC
  Administered 2024-06-23 – 2024-06-26 (×4): 12.5 mg via ORAL
  Filled 2024-06-22 (×4): qty 1

## 2024-06-22 MED ORDER — METHYLPREDNISOLONE SODIUM SUCC 40 MG IJ SOLR
40.0000 mg | Freq: Two times a day (BID) | INTRAMUSCULAR | Status: DC
  Administered 2024-06-23 – 2024-06-26 (×7): 40 mg via INTRAVENOUS
  Filled 2024-06-22 (×7): qty 1

## 2024-06-22 MED ORDER — SODIUM CHLORIDE 0.9 % IV SOLN
1.0000 g | Freq: Once | INTRAVENOUS | Status: AC
  Administered 2024-06-22: 1 g via INTRAVENOUS
  Filled 2024-06-22: qty 10

## 2024-06-22 MED ORDER — ONDANSETRON HCL 4 MG/2ML IJ SOLN: 4.0000 mg | Freq: Four times a day (QID) | INTRAMUSCULAR | Status: DC | PRN

## 2024-06-22 MED ORDER — ARFORMOTEROL TARTRATE 15 MCG/2ML IN NEBU
15.0000 ug | INHALATION_SOLUTION | Freq: Two times a day (BID) | RESPIRATORY_TRACT | Status: DC
  Administered 2024-06-22 – 2024-06-26 (×8): 15 ug via RESPIRATORY_TRACT
  Filled 2024-06-22 (×8): qty 2

## 2024-06-22 MED ORDER — ENOXAPARIN SODIUM 40 MG/0.4ML IJ SOSY
40.0000 mg | PREFILLED_SYRINGE | INTRAMUSCULAR | Status: DC
  Administered 2024-06-22 – 2024-06-25 (×4): 40 mg via SUBCUTANEOUS
  Filled 2024-06-22 (×4): qty 0.4

## 2024-06-22 MED ORDER — ONDANSETRON HCL 4 MG PO TABS: 4.0000 mg | ORAL_TABLET | Freq: Four times a day (QID) | ORAL | Status: DC | PRN

## 2024-06-22 MED ORDER — SODIUM CHLORIDE 0.9 % IV SOLN
500.0000 mg | Freq: Once | INTRAVENOUS | Status: AC
  Administered 2024-06-22: 500 mg via INTRAVENOUS
  Filled 2024-06-22: qty 5

## 2024-06-22 MED ORDER — SODIUM CHLORIDE 0.9 % IV SOLN
2.0000 g | INTRAVENOUS | Status: DC
  Administered 2024-06-23 – 2024-06-24 (×2): 2 g via INTRAVENOUS
  Filled 2024-06-22 (×2): qty 20

## 2024-06-22 MED ORDER — ROSUVASTATIN CALCIUM 5 MG PO TABS
5.0000 mg | ORAL_TABLET | Freq: Every day | ORAL | Status: DC
  Administered 2024-06-23 – 2024-06-26 (×4): 5 mg via ORAL
  Filled 2024-06-22 (×4): qty 1

## 2024-06-22 MED ORDER — ACETAMINOPHEN 325 MG PO TABS
650.0000 mg | ORAL_TABLET | Freq: Four times a day (QID) | ORAL | Status: DC | PRN
  Administered 2024-06-22 – 2024-06-26 (×6): 650 mg via ORAL
  Filled 2024-06-22 (×6): qty 2

## 2024-06-22 MED ORDER — IPRATROPIUM-ALBUTEROL 0.5-2.5 (3) MG/3ML IN SOLN
3.0000 mL | Freq: Four times a day (QID) | RESPIRATORY_TRACT | Status: DC | PRN
  Administered 2024-06-24 (×2): 3 mL via RESPIRATORY_TRACT
  Filled 2024-06-22 (×2): qty 3

## 2024-06-22 MED ORDER — NICOTINE 21 MG/24HR TD PT24
21.0000 mg | MEDICATED_PATCH | Freq: Every day | TRANSDERMAL | Status: DC
  Administered 2024-06-22 – 2024-06-26 (×5): 21 mg via TRANSDERMAL
  Filled 2024-06-22 (×5): qty 1

## 2024-06-22 MED ORDER — SENNOSIDES-DOCUSATE SODIUM 8.6-50 MG PO TABS: 1.0000 | ORAL_TABLET | Freq: Every evening | ORAL | Status: DC | PRN

## 2024-06-22 MED ORDER — SODIUM CHLORIDE 0.9 % IV SOLN: 500.0000 mg | INTRAVENOUS | Status: DC

## 2024-06-22 NOTE — ED Notes (Signed)
 Patient O2 sat drops to 88 on RA, placed on 2L [place]

## 2024-06-22 NOTE — H&P (Signed)
 " History and Physical    Sean Martinez FMW:[id number] DOB: July 21, 1960 DOA: 06/22/2024  PCP: Sebastian Beverley NOVAK, MD  Patient coming from: Home  I have personally briefly reviewed patient's old medical records in [facility]  Chief Complaint: Shortness of breath  HPI: Sean Martinez is a 64 y.o. male with medical history significant for COPD, T2DM, HTN, tobacco use who presented to the ED for evaluation of shortness of breath and cough.  Patient states that he has been feeling sick for the last week, symptoms began on his birthday 06/15/2024.  He has been having progressive shortness of breath occurring with exertion and while at rest.  He is a smoker and has a chronic cough at baseline which has increased in frequency with new thick white sputum production.  He has had subjective fevers and chills at home.  He was initially seen in urgent care on same day of symptom onset.  A chest x-ray did not show any focal consolidation, edema, effusion.  He was diagnosed with COPD exacerbation and started on a course of doxycycline  and prednisone  with recommendation to continue home Breztri  and rescue inhaler.  Patient states that despite antibiotics and steroids he has had worsening symptoms which prompted him to come to the ED for further evaluation.  ED Course  Labs/Imaging on admission: I have personally reviewed following labs and imaging studies.  Initial vitals showed BP 112/99, pulse 122, RR 22, temp 98.3 F, SpO2 92% on room air.  Labs showed WBC 13.5, hemoglobin 16.7, platelets 199, sodium 139, potassium 3.9, bicarb 23, BUN 16, creatinine 0.73, serum glucose 148.  SARS-CoV-2, influenza, RSV PCR negative.  Blood cultures ordered and pending collection.  CT chest without contrast showed right perihilar upper lobe peribronchovascular consolidation with possible right upper lobe cavitation measuring 1.3 x 1.1 cm.  Additional patchy consolidative airspace opacities in the right  upper lobe and right middle lobe noted.  Prominent but nonenlarged mediastinal lymph nodes without gross hilar adenopathy seen.  Emphysematous changes noted.  Patient was given IV Solu-Medrol  125 mg, IV ceftriaxone and azithromycin , DuoNeb treatment.  The hospitalist service was consulted for admission.  Review of Systems: All systems reviewed and are negative except as documented in history of present illness above.   Past Medical History:  Diagnosis Date   Asthma    Coronary artery disease    MI, old    Type 2 diabetes mellitus with hyperglycemia, without long-term current use of insulin  (HCC) 08/10/2022    Past Surgical History:  Procedure Laterality Date   HERNIA REPAIR     LEFT HEART CATHETERIZATION WITH CORONARY ANGIOGRAM N/A 07/17/2012   Procedure: LEFT HEART CATHETERIZATION WITH CORONARY ANGIOGRAM;  Surgeon: Erick JONELLE Bergamo, MD;  Location: [facility] CATH LAB;  Service: Cardiovascular;  Laterality: N/A;   VENTRAL HERNIA REPAIR N/A 07/09/2023   Procedure: OPEN RIGHT INGUINAL HERNIA REPAIR; DIAGNOSTIC LAPAROSCOPY;  Surgeon: Polly Cordella LABOR, MD;  Location: [facility];  Service: General;  Laterality: N/A;    Social History: Patient reports smoking 1.5 pack/day.  Allergies[1]  Family History  Problem Relation Age of Onset   Diabetes Mellitus II Mother      Prior to Admission medications  Medication Sig Start Date End Date Taking? Authorizing Provider  acetaminophen  (TYLENOL ) 325 MG tablet Take 2 tablets (650 mg total) by mouth every 6 (six) hours as needed for mild pain (pain score 1-3) or fever. 07/13/23   Vicci Burnard JONELLE, PA-C  albuterol  (VENTOLIN  HFA) 108 [phone] Base)  MCG/ACT inhaler Inhale 1-2 puffs into the lungs every 6 (six) hours as needed for wheezing or shortness of breath. 07/26/23 07/20/24  Sebastian Beverley NOVAK, MD  Blood Glucose Monitoring Suppl DEVI 1 each by Does not apply route in the morning, at noon, and at bedtime. May substitute to any manufacturer covered by patient's  insurance. 07/23/22   Sebastian Beverley NOVAK, MD  budesonide-glycopyrrolate-formoterol (BREZTRI  AEROSPHERE) 160-9-4.8 MCG/ACT AERO inhaler Inhale 2 puffs into the lungs 2 (two) times daily. 02/06/24   Billy Knee, FNP  Colchicine  0.6 MG CAPS At onset of flair, Take 1.2 mg (two tablets). Take 0.6 mg (one tablet) one hour later. Then, take 0.6 mg (one tablet) once daily for up to 7 days as needed for swelling and pain. 06/28/23   Sebastian Beverley NOVAK, MD  doxycycline  (VIBRAMYCIN ) 100 MG capsule Take 1 capsule (100 mg total) by mouth 2 (two) times daily. 06/15/24   Raspet, Erin K, PA-C  empagliflozin  (JARDIANCE ) 10 MG TABS tablet Take 1 tablet (10 mg total) by mouth daily. 04/30/24   McElwee, Tinnie LABOR, NP  indomethacin  (INDOCIN ) 50 MG capsule Take 1 capsule (50 mg total) by mouth 3 (three) times daily as needed for moderate pain (pain score 4-6) (severe pain (8-10)). 06/29/23   Sebastian Beverley NOVAK, MD  losartan  (COZAAR ) 25 MG tablet Take 0.5 tablets (12.5 mg total) by mouth daily. 02/06/24   Billy Knee, FNP  metFORMIN  (GLUCOPHAGE -XR) 750 MG 24 hr tablet Take 750 mg by mouth daily with breakfast.    [provider]  rosuvastatin  (CRESTOR ) 5 MG tablet Take 1 tablet by mouth once daily 01/25/24   Sebastian Beverley NOVAK, MD  tamsulosin  (FLOMAX ) 0.4 MG CAPS capsule Take 1 capsule (0.4 mg total) by mouth daily. 11/03/23   Sebastian Beverley NOVAK, MD    Physical Exam: Vitals:   06/22/24 1600 06/22/24 1800 06/22/24 1920 06/22/24 2003  BP:  (!) 131/93  (!) 138/91  Pulse: (!) 110 (!) 101  (!) 105  Resp: 18 (!) 21  19  Temp:   98.5 F (36.9 C) 98.1 F (36.7 C)  TempSrc:   Oral Oral  SpO2: 94% 95%  95%   Constitutional: NAD, calm, comfortable Eyes: EOMI, lids and conjunctivae normal ENMT: Mucous membranes are moist. Posterior pharynx clear of any exudate or lesions.Normal dentition.  Neck: normal, supple, no masses. Respiratory: Distant breath sounds with end expiratory wheezing bilaterally. Normal respiratory effort  while on 2 L O2 via [place]. No accessory muscle use.  Cardiovascular: Tachycardic, no murmurs / rubs / gallops. No extremity edema. 2+ pedal pulses. Abdomen: no tenderness, no masses palpated. Musculoskeletal: no clubbing / cyanosis. No joint deformity upper and lower extremities. Good ROM, no contractures. Normal muscle tone.  Skin: no rashes, lesions, ulcers. No induration Neurologic: Sensation intact. Strength 5/5 in all 4.  Psychiatric: Normal judgment and insight. Alert and oriented x 3. Normal mood.   EKG: Personally reviewed. Sinus tachycardia, rate 122, PAC.  Rate is faster compared to previous.  Assessment/Plan Principal Problem:   Community acquired pneumonia of right upper lobe of lung Active Problems:   COPD with acute exacerbation (HCC)   Acute respiratory failure with hypoxia (HCC)   Hypertension associated with diabetes (HCC)   Type 2 diabetes mellitus with proteinuria (HCC)   Sean Martinez is a 64 y.o. male with medical history significant for COPD, T2DM, HTN, tobacco use who is admitted with acute hypoxic respiratory failure secondary to COPD exacerbation and pneumonia.  Assessment and Plan: Acute  respiratory failure with hypoxia Community acquired pneumonia of right upper/middle lobes of lung COPD with acute exacerbation: Patient with acute hypoxic respiratory failure secondary to COPD exacerbation triggered by right upper/middle lobe pneumonia.  SpO2 was 88-92% on room air while in the ED, improved on 2 L supplemental to via [place].  WBC 13.5.  COVID, influenza, RSV PCR negative.  CT chest shows changes consistent with right upper and middle lobe pneumonia as well as question of cavitation in the right upper lobe. - Continue IV ceftriaxone and azithromycin  - Continue IV Solu-Medrol  40 mg twice daily - Continue Brovana/Pulmicort twice daily - DuoNebs as needed - Continue supplemental oxygen and wean as able - Incentive spirometer, flutter valve, Mucinex   Type 2  diabetes: Home meds on hold.  Placed on SSI.  Adjust as necessary while on steroids.  Hypertension: Continue losartan .  Hyperlipidemia: Continue rosuvastatin .  Tobacco use: Patient reports he has been smoking 1.5 PPD.  He states that he has no desire to continue smoking now.  Nicotine  patch provided.   DVT prophylaxis: enoxaparin (LOVENOX) injection 40 mg Start: 06/22/24 2200 Code Status: Full code, confirmed with patient on admission Family Communication: Discussed with patient, he has discussed with family Disposition Plan: From home and likely discharge to home pending clinical progress Consults called: None Severity of Illness: The appropriate patient status for this patient is INPATIENT. Inpatient status is judged to be reasonable and necessary in order to provide the required intensity of service to ensure the patient's safety. The patient's presenting symptoms, physical exam findings, and initial radiographic and laboratory data in the context of their chronic comorbidities is felt to place them at high risk for further clinical deterioration. Furthermore, it is not anticipated that the patient will be medically stable for discharge from the hospital within 2 midnights of admission.   * I certify that at the point of admission it is my clinical judgment that the patient will require inpatient hospital care spanning beyond 2 midnights from the point of admission due to high intensity of service, high risk for further deterioration and high frequency of surveillance required.DEWAINE Jorie Blanch MD [facility]  If 7PM-7AM, please contact night-coverage [email or web address]  06/22/2024, 8:55 PM      [1]  Allergies Allergen Reactions   Penicillins Hives    Has patient had a PCN reaction causing immediate rash, facial/tongue/throat swelling, SOB or lightheadedness with hypotension: Yes Has patient had a PCN reaction causing severe rash involving mucus membranes or skin necrosis:  No Has patient had a PCN reaction that required hospitalization: No Has patient had a PCN reaction occurring within the last 10 years: No If all of the above answers are NO, then may proceed with Cephalosporin use.  Received Ancef  for surgery on 07/09/23 with no issues.   "

## 2024-06-22 NOTE — ED Triage Notes (Signed)
 C/o HA, SOB. Some CP due to coughing. Seen at [facility] last Friday for same - given breathing tx with some relief. Now having severe dyspnea when walking 5 feet. Hx COPD. Does not wear O2 at home. 88-92% on room air in triage.

## 2024-06-22 NOTE — ED Provider Notes (Signed)
 " [place] EMERGENCY DEPARTMENT AT [facility] Provider Note   CSN: [id number] Arrival date & time: 06/22/24  1444     Patient presents with: Shortness of Breath   Sean Martinez is a 64 y.o. male.   64 year old male with prior medical history as detailed presents for evaluation.  Patient with increased wheezing, cough, worsening dyspnea x 1 week.  Patient was seen by urgent care last week for same complaint.  Patient was given a course of doxycycline  and prednisone  burst (20 mg/day).  He has completed the prednisone  burst.  He reports that he is still taking the last several doses of doxycycline .  He complains of persistent dyspnea, increased work of breathing.  Patient is wheezing on initial exam.  Sats on room air are 90 to 92%.  He is not on home O2.  Heart rate is in the 120s to 130s with any exertion.  The history is provided by the patient and medical records.       Prior to Admission medications  Medication Sig Start Date End Date Taking? Authorizing Provider  acetaminophen  (TYLENOL ) 325 MG tablet Take 2 tablets (650 mg total) by mouth every 6 (six) hours as needed for mild pain (pain score 1-3) or fever. 07/13/23   Vicci Burnard SAUNDERS, PA-C  albuterol  (VENTOLIN  HFA) 108 (90 Base) MCG/ACT inhaler Inhale 1-2 puffs into the lungs every 6 (six) hours as needed for wheezing or shortness of breath. 07/26/23 07/20/24  Sebastian Beverley NOVAK, MD  Blood Glucose Monitoring Suppl DEVI 1 each by Does not apply route in the morning, at noon, and at bedtime. May substitute to any manufacturer covered by patient's insurance. 07/23/22   Sebastian Beverley NOVAK, MD  budesonide-glycopyrrolate-formoterol (BREZTRI  AEROSPHERE) 160-9-4.8 MCG/ACT AERO inhaler Inhale 2 puffs into the lungs 2 (two) times daily. 02/06/24   Billy Knee, FNP  Colchicine  0.6 MG CAPS At onset of flair, Take 1.2 mg (two tablets). Take 0.6 mg (one tablet) one hour later. Then, take 0.6 mg (one tablet) once daily for up to 7 days as  needed for swelling and pain. 06/28/23   Sebastian Beverley NOVAK, MD  doxycycline  (VIBRAMYCIN ) 100 MG capsule Take 1 capsule (100 mg total) by mouth 2 (two) times daily. 06/15/24   Raspet, Erin K, PA-C  empagliflozin  (JARDIANCE ) 10 MG TABS tablet Take 1 tablet (10 mg total) by mouth daily. 04/30/24   McElwee, Lauren A, NP  indomethacin  (INDOCIN ) 50 MG capsule Take 1 capsule (50 mg total) by mouth 3 (three) times daily as needed for moderate pain (pain score 4-6) (severe pain (8-10)). 06/29/23   Sebastian Beverley NOVAK, MD  losartan  (COZAAR ) 25 MG tablet Take 0.5 tablets (12.5 mg total) by mouth daily. 02/06/24   Billy Knee, FNP  metFORMIN  (GLUCOPHAGE -XR) 750 MG 24 hr tablet Take 750 mg by mouth daily with breakfast.    [provider]  rosuvastatin  (CRESTOR ) 5 MG tablet Take 1 tablet by mouth once daily 01/25/24   Sebastian Beverley NOVAK, MD  tamsulosin  (FLOMAX ) 0.4 MG CAPS capsule Take 1 capsule (0.4 mg total) by mouth daily. 11/03/23   Sebastian Beverley NOVAK, MD    Allergies: Penicillins    Review of Systems  All other systems reviewed and are negative.   Updated Vital Signs BP (!) 112/99 (BP Location: Left Arm)   Pulse (!) 137   Temp 98.3 F (36.8 C) (Oral)   Resp (!) 22   SpO2 92%   Physical Exam Vitals and nursing note reviewed.  Constitutional:      General: He is not in acute distress.    Appearance: He is well-developed.  HENT:     Head: Normocephalic and atraumatic.  Eyes:     Conjunctiva/sclera: Conjunctivae normal.  Cardiovascular:     Rate and Rhythm: Normal rate and regular rhythm.     Heart sounds: No murmur heard. Pulmonary:     Effort: No respiratory distress.     Comments: Increased work of breathing noted, tachypnea present, diffuse expiratory wheezes in all lung fields Abdominal:     Palpations: Abdomen is soft.     Tenderness: There is no abdominal tenderness.  Musculoskeletal:        General: No swelling.     Cervical back: Neck supple.  Skin:    General: Skin is warm  and dry.     Capillary Refill: Capillary refill takes less than 2 seconds.  Neurological:     Mental Status: He is alert.  Psychiatric:        Mood and Affect: Mood normal.     (all labs ordered are listed, but only abnormal results are displayed) Labs Reviewed  CBC - Abnormal; Notable for the following components:      Result Value   WBC 13.5 (*)    All other components within normal limits  RESP PANEL BY RT-PCR (RSV, FLU A&B, COVID)  RVPGX2  BASIC METABOLIC PANEL WITH GFR    EKG: EKG Interpretation Date/Time:  Friday June 22 2024 14:58:35 EST Ventricular Rate:  122 PR Interval:  145 QRS Duration:  94 QT Interval:  280 QTC Calculation: 399 R Axis:   93  Text Interpretation: Sinus tachycardia Atrial premature complexes Consider right atrial enlargement Probable RVH w/ secondary repol abnormality Borderline ST depression, lateral leads Confirmed by Laurice Coy [phone]) on 06/22/2024 3:21:23 PM  Radiology: No results found.   Procedures   Medications Ordered in the ED  ipratropium-albuterol  (DUONEB) 0.5-2.5 (3) MG/3ML nebulizer solution 3 mL (has no administration in time range)  methylPREDNISolone  sodium succinate (SOLU-MEDROL ) 125 mg/2 mL injection 125 mg (has no administration in time range)                                    Medical Decision Making Patient is presenting with increased shortness of breath -significant wheezing noted on initial evaluation.  Patient has been on both steroids and is currently still taking doxycycline .  Patient's symptoms are strongly suggestive of likely COPD exacerbation.  Steroids and DuoNeb treatment initiated in the ED.  Imaging both chest x-ray and CT suggest possible component of pneumonia.  Rocephin and azithromycin  started for treatment of suspected CAP after outpatient course of doxycycline .  Patient would benefit from admission.  Hospitalist service made aware of case.  Amount and/or Complexity of Data Reviewed Labs:  ordered. Radiology: ordered.  Risk Prescription drug management. Decision regarding hospitalization.        Final diagnoses:  Dyspnea, unspecified type  COPD exacerbation (HCC)  Community acquired pneumonia, unspecified laterality    ED Discharge Orders     None          Laurice Coy BROCKS, MD 06/22/24 1801  "

## 2024-06-22 NOTE — Hospital Course (Signed)
 Sean Martinez is a 64 y.o. male with medical history significant for COPD, T2DM, HTN, tobacco use who is admitted with acute hypoxic respiratory failure secondary to COPD exacerbation and pneumonia.

## 2024-06-23 ENCOUNTER — Encounter (HOSPITAL_COMMUNITY): Payer: Self-pay | Admitting: Internal Medicine

## 2024-06-23 DIAGNOSIS — J189 Pneumonia, unspecified organism: Secondary | ICD-10-CM | POA: Diagnosis not present

## 2024-06-23 LAB — BASIC METABOLIC PANEL WITH GFR
Anion gap: 13 (ref 5–15)
BUN: 23 mg/dL (ref 8–23)
CO2: 25 mmol/L (ref 22–32)
Calcium: 9.6 mg/dL (ref 8.9–10.3)
Chloride: 99 mmol/L (ref 98–111)
Creatinine, Ser: 0.71 mg/dL (ref 0.61–1.24)
GFR, Estimated: >60 mL/min
Glucose, Bld: 215 mg/dL — ABNORMAL HIGH (ref 70–99)
Potassium: 4.4 mmol/L (ref 3.5–5.1)
Sodium: 137 mmol/L (ref 135–145)

## 2024-06-23 LAB — CBC
HCT: 45.4 % (ref 39.0–52.0)
Hemoglobin: 15.7 g/dL (ref 13.0–17.0)
MCH: 32.4 pg (ref 26.0–34.0)
MCHC: 34.6 g/dL (ref 30.0–36.0)
MCV: 93.8 fL (ref 80.0–100.0)
Platelets: 204 K/uL (ref 150–400)
RBC: 4.84 MIL/uL (ref 4.22–5.81)
RDW: 12.8 % (ref 11.5–15.5)
WBC: 8.5 K/uL (ref 4.0–10.5)
nRBC: 0 % (ref 0.0–0.2)

## 2024-06-23 LAB — GLUCOSE, CAPILLARY
Glucose-Capillary: 189 mg/dL — ABNORMAL HIGH (ref 70–99)
Glucose-Capillary: 215 mg/dL — ABNORMAL HIGH (ref 70–99)
Glucose-Capillary: 284 mg/dL — ABNORMAL HIGH (ref 70–99)
Glucose-Capillary: 285 mg/dL — ABNORMAL HIGH (ref 70–99)

## 2024-06-23 LAB — STREP PNEUMONIAE URINARY ANTIGEN: Strep Pneumo Urinary Antigen: NEGATIVE

## 2024-06-23 MED ORDER — AZITHROMYCIN 250 MG PO TABS
500.0000 mg | ORAL_TABLET | Freq: Every day | ORAL | Status: DC
  Administered 2024-06-23 – 2024-06-25 (×3): 500 mg via ORAL
  Filled 2024-06-23 (×3): qty 2

## 2024-06-23 MED ORDER — ACETAMINOPHEN 160 MG/5ML PO SOLN: 500.0000 mg | Freq: Once | ORAL | Status: DC

## 2024-06-23 NOTE — Progress Notes (Signed)
 " [facility] Progress [facility]  Patient: Sean Martinez     FMW:[id number]  DOA: 06/22/2024   PCP: Sebastian Beverley NOVAK, MD       Brief hospital course: 64 y/o with COPD, DM2, HTN and ongoing tobacco abuse presents for shortness of breath, increase in chronic cough, fever, chills and is admitted for PNA.  CT of the chest showed a RUL cavitation 1.3 x 1.1 cm and patchy consolidation in RUL and RML. He was treated with steroids, Azithromycin  and Ceftriaxone .   Subjective:  He feels short of breath walking to the bathroom. Has a cough.   Assessment and Plan: Principal Problem:   Community acquired pneumonia of right upper lobe of lung with acute hypoxia and COPD exacerbation - pulse ox was 88% on room air initially - still short of breath and has poor breath sounds  - cont IV Abx, steroids, Nebs and wean O2  Active Problems:     Hypertension associated with diabetes  - cont Losartan     Type 2 diabetes mellitus with proteinuria   -Novolog  SS      Code Status: Full Code Total time on patient care: 35 min DVT prophylaxis:  enoxaparin  (LOVENOX ) injection 40 mg Start: 06/22/24 2200     Objective:   Vitals:   06/23/24 0159 06/23/24 0608 06/23/24 0754 06/23/24 0900  BP: (!) 132/93 (!) 140/97  136/76  Pulse: 92 77  83  Resp: 18 18  16   Temp: 98.1 F (36.7 C) 98.2 F (36.8 C)  98 F (36.7 C)  TempSrc: Oral Oral  Oral  SpO2: 96% 96% 94% 96%  Weight:      Height:       Filed Weights   06/22/24 2158  Weight: 77.7 kg   Exam: General exam: Appears comfortable  HEENT: oral mucosa moist Respiratory system: Clear to auscultation. Very poor breath sounds specifically on right Cardiovascular system: S1 & S2 heard  Gastrointestinal system: Abdomen soft, non-tender, nondistended. Normal bowel sounds   Extremities: No cyanosis, clubbing or edema Psychiatry:  Mood & affect appropriate.      CBC: Recent Labs  Lab 06/22/24 1513 06/23/24 0509  WBC 13.5* 8.5  HGB 16.7  15.7  HCT 48.3 45.4  MCV 94.0 93.8  PLT 199 204   Basic Metabolic Panel: Recent Labs  Lab 06/22/24 1513 06/23/24 0509  NA 139 137  K 3.9 4.4  CL 101 99  CO2 23 25  GLUCOSE 148* 215*  BUN 16 23  CREATININE 0.73 0.71  CALCIUM  9.6 9.6     Scheduled Meds:  arformoterol   15 mcg Nebulization BID   azithromycin   500 mg Oral QHS   budesonide  (PULMICORT ) nebulizer solution  0.25 mg Nebulization BID   enoxaparin  (LOVENOX ) injection  40 mg Subcutaneous Q24H   guaiFENesin   600 mg Oral BID   insulin  aspart  0-5 Units Subcutaneous QHS   insulin  aspart  0-9 Units Subcutaneous TID WC   losartan   12.5 mg Oral Daily   methylPREDNISolone  (SOLU-MEDROL ) injection  40 mg Intravenous Q12H   nicotine   21 mg Transdermal Daily   rosuvastatin   5 mg Oral Daily    Imaging and lab data personally reviewed   Author: Ashton Sabine  06/23/2024 2:05 PM  To contact [facility]>   Check the care team in [facility] and look for the attending/consulting [facility] provider listed  Log into [email or web address] and use [place]'s universal password   Go to> [facility]  and find provider  If you  still have difficulty reaching the provider, please page the [facility] (Director on Call) for the Hospitalists listed on amion     "

## 2024-06-23 NOTE — Plan of Care (Signed)
   Problem: Coping: Goal: Ability to adjust to condition or change in health will improve Outcome: Progressing

## 2024-06-24 DIAGNOSIS — J189 Pneumonia, unspecified organism: Secondary | ICD-10-CM | POA: Diagnosis not present

## 2024-06-24 LAB — GLUCOSE, CAPILLARY
Glucose-Capillary: 224 mg/dL — ABNORMAL HIGH (ref 70–99)
Glucose-Capillary: 252 mg/dL — ABNORMAL HIGH (ref 70–99)
Glucose-Capillary: 325 mg/dL — ABNORMAL HIGH (ref 70–99)
Glucose-Capillary: 245 mg/dL — ABNORMAL HIGH (ref 70–99)

## 2024-06-24 LAB — LEGIONELLA PNEUMOPHILA SEROGP 1 UR AG: L. pneumophila Serogp 1 Ur Ag: NEGATIVE

## 2024-06-24 MED ORDER — GUAIFENESIN-DM 100-10 MG/5ML PO SYRP
5.0000 mL | ORAL_SOLUTION | ORAL | Status: DC | PRN
  Administered 2024-06-24 – 2024-06-25 (×2): 5 mL via ORAL
  Filled 2024-06-24 (×2): qty 10

## 2024-06-24 MED ORDER — IBUPROFEN 400 MG PO TABS
400.0000 mg | ORAL_TABLET | ORAL | Status: DC | PRN
  Administered 2024-06-24 – 2024-06-25 (×3): 400 mg via ORAL
  Filled 2024-06-24 (×3): qty 1

## 2024-06-24 NOTE — Plan of Care (Signed)
  Problem: Fluid Volume: Goal: Ability to maintain a balanced intake and output will improve Outcome: Progressing   Problem: Metabolic: Goal: Ability to maintain appropriate glucose levels will improve Outcome: Progressing   Problem: Clinical Measurements: Goal: Ability to maintain clinical measurements within normal limits will improve Outcome: Progressing   Problem: Activity: Goal: Risk for activity intolerance will decrease Outcome: Progressing   Problem: Nutrition: Goal: Adequate nutrition will be maintained Outcome: Progressing   Problem: Elimination: Goal: Will not experience complications related to bowel motility Outcome: Progressing Goal: Will not experience complications related to urinary retention Outcome: Progressing   Problem: Pain Managment: Goal: General experience of comfort will improve and/or be controlled Outcome: Progressing

## 2024-06-24 NOTE — Plan of Care (Signed)
   Problem: Fluid Volume: Goal: Ability to maintain a balanced intake and output will improve Outcome: Progressing

## 2024-06-24 NOTE — Progress Notes (Signed)
" °   06/24/24 0916  TOC Brief Assessment  Insurance and Status Reviewed  Patient has primary care physician Yes  Home environment has been reviewed From home  Prior level of function: Independent  Prior/Current Home Services No current home services  Social Drivers of Health Review SDOH reviewed no interventions necessary  Readmission risk has been reviewed Yes  Transition of care needs transition of care needs identified, TOC will continue to follow    "

## 2024-06-24 NOTE — Progress Notes (Signed)
 " [facility] Progress Note  Patient: Sean Martinez     FMW:[id number]  DOA: 06/22/2024   PCP: Sebastian Beverley NOVAK, MD       Brief hospital course: 64 y/o with COPD, DM2, HTN and ongoing tobacco abuse presents for shortness of breath, increase in chronic cough, fever, chills and is admitted for PNA.  CT of the chest showed a RUL cavitation 1.3 x 1.1 cm and patchy consolidation in RUL and RML. He was treated with steroids, Azithromycin  and Ceftriaxone .   Subjective:  He still remains very short of breath with exertion to only the bathroom. Has had a cough.   Assessment and Plan: Principal Problem:   Community acquired pneumonia of right upper lobe of lung with acute hypoxia and COPD exacerbation - pulse ox was 88% on room air initially - still short of breath   - cont IV Abx, steroids, Nebs and wean O2  Active Problems:  Nicotine  abuse - counseled - cont Nicoderm patch     Hypertension associated with diabetes  - cont Losartan     Type 2 diabetes mellitus with proteinuria   -Novolog  SS      Code Status: Full Code Total time on patient care: 35 min DVT prophylaxis:  enoxaparin  (LOVENOX ) injection 40 mg Start: 06/22/24 2200     Objective:   Vitals:   06/23/24 2210 06/24/24 0436 06/24/24 0629 06/24/24 0755  BP: 136/88  121/86   Pulse: 91  89   Resp: 17  16   Temp: 98 F (36.7 C)  97.8 F (36.6 C)   TempSrc: Oral  Oral   SpO2: 93% 95% 96% 95%  Weight:      Height:       Filed Weights   06/22/24 2158  Weight: 77.7 kg   Exam: General exam: Appears comfortable  HEENT: oral mucosa moist Respiratory system: Clear to auscultation. Very poor breath sounds specifically on right Cardiovascular system: S1 & S2 heard  Gastrointestinal system: Abdomen soft, non-tender, nondistended. Normal bowel sounds   Extremities: No cyanosis, clubbing or edema Psychiatry:  Mood & affect appropriate.      CBC: Recent Labs  Lab 06/22/24 1513 06/23/24 0509  WBC  13.5* 8.5  HGB 16.7 15.7  HCT 48.3 45.4  MCV 94.0 93.8  PLT 199 204   Basic Metabolic Panel: Recent Labs  Lab 06/22/24 1513 06/23/24 0509  NA 139 137  K 3.9 4.4  CL 101 99  CO2 23 25  GLUCOSE 148* 215*  BUN 16 23  CREATININE 0.73 0.71  CALCIUM  9.6 9.6     Scheduled Meds:  arformoterol   15 mcg Nebulization BID   azithromycin   500 mg Oral QHS   budesonide  (PULMICORT ) nebulizer solution  0.25 mg Nebulization BID   enoxaparin  (LOVENOX ) injection  40 mg Subcutaneous Q24H   guaiFENesin   600 mg Oral BID   insulin  aspart  0-5 Units Subcutaneous QHS   insulin  aspart  0-9 Units Subcutaneous TID WC   losartan   12.5 mg Oral Daily   methylPREDNISolone  (SOLU-MEDROL ) injection  40 mg Intravenous Q12H   nicotine   21 mg Transdermal Daily   rosuvastatin   5 mg Oral Daily    Imaging and lab data personally reviewed   Author: Bryli Mantey  06/24/2024 9:55 AM  To contact [facility]>   Check the care team in [facility] and look for the attending/consulting [facility] provider listed  Log into [email or web address] and use [place]'s universal password   Go to> [facility]  and  find provider  If you still have difficulty reaching the provider, please page the [facility] (Director on Call) for the Hospitalists listed on amion     "

## 2024-06-25 ENCOUNTER — Other Ambulatory Visit: Payer: Self-pay

## 2024-06-25 ENCOUNTER — Other Ambulatory Visit (HOSPITAL_COMMUNITY): Payer: Self-pay

## 2024-06-25 DIAGNOSIS — J189 Pneumonia, unspecified organism: Secondary | ICD-10-CM | POA: Diagnosis not present

## 2024-06-25 LAB — GLUCOSE, CAPILLARY
Glucose-Capillary: 230 mg/dL — ABNORMAL HIGH (ref 70–99)
Glucose-Capillary: 213 mg/dL — ABNORMAL HIGH (ref 70–99)
Glucose-Capillary: 340 mg/dL — ABNORMAL HIGH (ref 70–99)

## 2024-06-25 MED ORDER — IBUPROFEN 400 MG PO TABS
400.0000 mg | ORAL_TABLET | ORAL | 0 refills | Status: AC | PRN
  Filled 2024-06-25: qty 30, 5d supply, fill #0

## 2024-06-25 MED ORDER — INSULIN GLARGINE 100 UNIT/ML ~~LOC~~ SOLN
8.0000 [IU] | Freq: Every day | SUBCUTANEOUS | Status: DC
  Filled 2024-06-25: qty 0.08

## 2024-06-25 MED ORDER — GLIPIZIDE ER 5 MG PO TB24
5.0000 mg | ORAL_TABLET | Freq: Every day | ORAL | Status: DC
  Administered 2024-06-26: 5 mg via ORAL
  Filled 2024-06-25: qty 1

## 2024-06-25 MED ORDER — ROBAFEN DM 20-200 MG/20ML PO LIQD
5.0000 mL | ORAL | 0 refills | Status: AC | PRN
  Filled 2024-06-25: qty 118, 5d supply, fill #0
  Filled 2024-06-25: qty 118, 4d supply, fill #0

## 2024-06-25 MED ORDER — EMPAGLIFLOZIN 10 MG PO TABS
10.0000 mg | ORAL_TABLET | Freq: Every day | ORAL | Status: DC
  Administered 2024-06-25 – 2024-06-26 (×2): 10 mg via ORAL
  Filled 2024-06-25 (×2): qty 1

## 2024-06-25 MED ORDER — BUTALBITAL-APAP-CAFFEINE 50-325-40 MG PO TABS
2.0000 | ORAL_TABLET | Freq: Once | ORAL | Status: AC
  Administered 2024-06-25: 2 via ORAL
  Filled 2024-06-25: qty 2

## 2024-06-25 MED ORDER — GLIPIZIDE ER 5 MG PO TB24
ORAL_TABLET | ORAL | 0 refills | Status: AC
  Filled 2024-06-25: qty 20, 20d supply, fill #0

## 2024-06-25 MED ORDER — GUAIFENESIN ER 600 MG PO TB12
600.0000 mg | ORAL_TABLET | Freq: Two times a day (BID) | ORAL | 0 refills | Status: AC
  Filled 2024-06-25: qty 60, 30d supply, fill #0

## 2024-06-25 MED ORDER — METFORMIN HCL ER 750 MG PO TB24
750.0000 mg | ORAL_TABLET | Freq: Every day | ORAL | Status: DC
  Administered 2024-06-25 – 2024-06-26 (×2): 750 mg via ORAL
  Filled 2024-06-25 (×2): qty 1

## 2024-06-25 MED ORDER — SENNOSIDES-DOCUSATE SODIUM 8.6-50 MG PO TABS: 1.0000 | ORAL_TABLET | Freq: Every evening | ORAL | Status: AC | PRN

## 2024-06-25 MED ORDER — NICOTINE 21 MG/24HR TD PT24
21.0000 mg | MEDICATED_PATCH | Freq: Every day | TRANSDERMAL | 0 refills | Status: DC
  Filled 2024-06-25: qty 28, 28d supply, fill #0

## 2024-06-25 MED ORDER — MELATONIN 5 MG PO TABS
5.0000 mg | ORAL_TABLET | Freq: Once | ORAL | Status: AC
  Administered 2024-06-25: 5 mg via ORAL
  Filled 2024-06-25: qty 1

## 2024-06-25 MED ORDER — AZITHROMYCIN 500 MG PO TABS
500.0000 mg | ORAL_TABLET | Freq: Once | ORAL | 0 refills | Status: DC
  Filled 2024-06-25: qty 1, 1d supply, fill #0

## 2024-06-25 MED ORDER — IPRATROPIUM-ALBUTEROL 0.5-2.5 (3) MG/3ML IN SOLN
3.0000 mL | Freq: Four times a day (QID) | RESPIRATORY_TRACT | 0 refills | Status: AC | PRN
  Filled 2024-06-25: qty 180, 15d supply, fill #0
  Filled 2024-06-25: qty 360, 30d supply, fill #0

## 2024-06-25 MED ORDER — CEFDINIR 300 MG PO CAPS
300.0000 mg | ORAL_CAPSULE | Freq: Two times a day (BID) | ORAL | 0 refills | Status: DC
  Filled 2024-06-25: qty 6, 3d supply, fill #0

## 2024-06-25 MED ORDER — ARFORMOTEROL TARTRATE 15 MCG/2ML IN NEBU
15.0000 ug | INHALATION_SOLUTION | Freq: Two times a day (BID) | RESPIRATORY_TRACT | 0 refills | Status: AC
  Filled 2024-06-25: qty 60, 15d supply, fill #0

## 2024-06-25 MED ORDER — BUDESONIDE 0.25 MG/2ML IN SUSP
0.2500 mg | Freq: Two times a day (BID) | RESPIRATORY_TRACT | 12 refills | Status: AC
  Filled 2024-06-25: qty 60, 15d supply, fill #0

## 2024-06-25 MED ORDER — PREDNISONE 10 MG PO TABS
ORAL_TABLET | ORAL | 0 refills | Status: AC
  Filled 2024-06-25: qty 41, 15d supply, fill #0

## 2024-06-25 NOTE — Progress Notes (Signed)
 Pt has been waiting on d/c equipment since 0930 for home nebulizer and home O2 which was never ordered and TOC has left for the day. I went in and explained to pt about his home equipment and possibly staying until tomorrow pt said he was not staying another day I also explained that he has a co pay and here is the number to call them I went back to my [facility] nurse and explained to her what was said we sent a message to the doctor Rizan  and she said it is up to him if he wants to leave even though he is short of breath she would like PT to sign out AMA

## 2024-06-25 NOTE — Progress Notes (Signed)
 Discharge medications delivered to patient at the bedside in a secure bag.

## 2024-06-25 NOTE — Progress Notes (Signed)
 Discharge instructions reviewed with patient, verbalized understanding. All questions answered. All belongings accounted for. Patient to follow up with MD  on Wednesday 1/7.   Waiting for delivery of home o2 and nebulizer machine still.

## 2024-06-25 NOTE — Discharge Summary (Addendum)
 Physician Discharge Summary  Sean Martinez FMW:[id number] DOB: 1960-10-05 DOA: 06/22/2024  PCP: Sebastian Beverley NOVAK, MD  Admit date: 06/22/2024 Discharge date: 06/26/2024 Discharging to: home Recommendations for Outpatient Follow-up:  Please repeat CXR in 4-6 wks Will dc home with 2 L O2 and neb treatments- will need pulse ox screen at next office visit  The patient's discharge was cancelled on 1/5 as O2 and Neb machine had not been delivered to patient.    Discharge Diagnoses:   Principal Problem:   Community acquired pneumonia of right upper lobe of lung Active Problems:   COPD with acute exacerbation (HCC)   Acute respiratory failure with hypoxia (HCC)   Hypertension associated with diabetes (HCC)   Type 2 diabetes mellitus with proteinuria (HCC)   Brief hospital course: 64 y/o with COPD, DM2, HTN and ongoing tobacco abuse presents for shortness of breath, increase in chronic cough, fever, chills and is admitted for PNA.  CT of the chest showed a RUL cavitation 1.3 x 1.1 cm and patchy consolidation in RUL and RML. He was treated with steroids, Azithromycin  and Ceftriaxone .    Subjective:  He still remains very short of breath with exertion to only the bathroom. Has had a cough.    Assessment and Plan: Principal Problem:   Community acquired pneumonia of right upper lobe of lung with acute hypoxia and COPD exacerbation - Cavitary pneumonia - pulse ox was 88% on room air initially - he remains 88% on room air today and will dc home with O2, long steroid taper and antibiotics - sputum culture reveals few pseudomonas- he will need at least 7 days of Levaquin    Active Problems:   Nicotine  abuse - counseled - cont Nicoderm patch     Hypertension associated with diabetes  - cont Losartan      Type 2 diabetes mellitus with proteinuria   - have had to initiate Glipizide  due to the persistent need for steroids - cont Jardiance  and Metformin             Discharge  Instructions   Allergies as of 06/26/2024       Reactions   Penicillins Hives   Has patient had a PCN reaction causing immediate rash, facial/tongue/throat swelling, SOB or lightheadedness with hypotension: Yes Has patient had a PCN reaction causing severe rash involving mucus membranes or skin necrosis: No Has patient had a PCN reaction that required hospitalization: No Has patient had a PCN reaction occurring within the last 10 years: No If all of the above answers are NO, then may proceed with Cephalosporin use. Received Ancef  for surgery on 07/09/23 with no issues.        Medication List     STOP taking these medications    doxycycline  100 MG capsule Commonly known as: VIBRAMYCIN    indomethacin  50 MG capsule Commonly known as: INDOCIN        TAKE these medications    acetaminophen  325 MG tablet Commonly known as: TYLENOL  Take 2 tablets (650 mg total) by mouth every 6 (six) hours as needed for mild pain (pain score 1-3) or fever.   albuterol  108 (90 Base) MCG/ACT inhaler Commonly known as: VENTOLIN  HFA Inhale 1-2 puffs into the lungs every 6 (six) hours as needed for wheezing or shortness of breath.   arformoterol  15 MCG/2ML Nebu Commonly known as: BROVANA  Inhale 2 mLs (15 mcg total) by nebulization 2 (two) times daily.   Blood Glucose Monitoring Suppl Devi 1 each by Does not apply route in the morning,  at noon, and at bedtime. May substitute to any manufacturer covered by patient's insurance.   Breztri  Aerosphere 160-9-4.8 MCG/ACT Aero inhaler Generic drug: budesonide -glycopyrrolate-formoterol Inhale 2 puffs into the lungs 2 (two) times daily.   budesonide  0.25 MG/2ML nebulizer solution Commonly known as: PULMICORT  Inhale 2 mLs (0.25 mg total) by nebulization 2 (two) times daily.   Colchicine  0.6 MG Caps At onset of flair, Take 1.2 mg (two tablets). Take 0.6 mg (one tablet) one hour later. Then, take 0.6 mg (one tablet) once daily for up to 7 days as needed  for swelling and pain.   empagliflozin  10 MG Tabs tablet Commonly known as: JARDIANCE  Take 1 tablet (10 mg total) by mouth daily.   glipiZIDE  5 MG 24 hr tablet Commonly known as: GLUCOTROL  XL Please take this if your fasting AM blood glucose is > 150   guaiFENesin  600 MG 12 hr tablet Commonly known as: MUCINEX  Take 1 tablet (600 mg total) by mouth 2 (two) times daily.   ibuprofen  400 MG tablet Commonly known as: ADVIL  Take 1 tablet (400 mg total) by mouth every 4 (four) hours as needed for fever or headache.   ipratropium-albuterol  0.5-2.5 (3) MG/3ML Soln Commonly known as: DUONEB Inhale 3 mLs by nebulization every 6 (six) hours as needed.   levofloxacin  750 MG tablet Commonly known as: LEVAQUIN  Take 1 tablet (750 mg total) by mouth daily.   losartan  25 MG tablet Commonly known as: COZAAR  Take 0.5 tablets (12.5 mg total) by mouth daily.   metFORMIN  750 MG 24 hr tablet Commonly known as: GLUCOPHAGE -XR Take 750 mg by mouth daily with breakfast.   nicotine  21 mg/24hr patch Commonly known as: NICODERM CQ  - dosed in mg/24 hours Place 1 patch (21 mg total) onto the skin daily.   predniSONE  10 MG tablet Commonly known as: DELTASONE  Take 6 tablets by mouth daily with breakfast for 3 days, THEN take 4 tablets daily for 3 days, THEN take 2 tablets daily for 3 days, THEN take 1 tablet daily for 3 days, THEN take  0.5 tablets daily for 3 days. Start taking on: June 25, 2024 What changed:  medication strength See the new instructions.   Robafen DM 20-200 MG/20ML Liqd Generic drug: Dextromethorphan -guaiFENesin  Take 5 mLs by mouth every 4 (four) hours as needed (chest congestion).   rosuvastatin  5 MG tablet Commonly known as: CRESTOR  Take 1 tablet by mouth once daily   senna-docusate 8.6-50 MG tablet Commonly known as: Senokot-S Take 1 tablet by mouth at bedtime as needed for mild constipation.   SLEEP AID PO Take 1 tablet by mouth at bedtime as needed.   tamsulosin   0.4 MG Caps capsule Commonly known as: FLOMAX  Take 1 capsule (0.4 mg total) by mouth daily.               Durable Medical Equipment  (From admission, onward)           Start     Ordered   06/25/24 1223  For home use only DME oxygen  Once       Question Answer Comment  Length of Need 6 Months   Mode or (Route) Nasal cannula   Liters per Minute 2   Frequency Continuous (stationary and portable oxygen unit needed)   Oxygen conserving device Yes   Oxygen delivery system: Gas   Oxygen delivery system: Portable concentrator (POC)      06/25/24 1223   06/25/24 0836  For home use only DME Nebulizer machine  Once  Question Answer Comment  Patient needs a nebulizer to treat with the following condition COPD with acute exacerbation (HCC)   Length of Need 6 Months   Additional equipment included Administration kit   Additional equipment included Filter      06/25/24 0835            Follow-up Information     Sebastian Beverley NOVAK, MD. Schedule an appointment as soon as possible for a visit in 1 week(s).   Specialty: Family Medicine Contact information: [address] [place] [address] [phone]                    The results of significant diagnostics from this hospitalization (including imaging, microbiology, ancillary and laboratory) are listed below for reference.    CT CHEST WO CONTRAST Result Date: 06/22/2024 EXAM: CT CHEST WITHOUT CONTRAST 06/22/2024 04:50:47 PM TECHNIQUE: CT of the chest was performed without the administration of intravenous contrast. Multiplanar reformatted images are provided for review. Automated exposure control, iterative reconstruction, and/or weight based adjustment of the mA/kV was utilized to reduce the radiation dose to as low as reasonably achievable. COMPARISON: None available. CLINICAL HISTORY: Respiratory illness, nondiagnostic xray. FINDINGS: MEDIASTINUM: Heart and pericardium are unremarkable. The central  airways are clear. The main pulmonary artery is normal in caliber. The thoracic aorta is normal in caliber. LYMPH NODES: Prominent but not enlarged mediastinal lymph nodes. No gross hilar adenopathy with limited evaluation on this noncontrast study. No axillary lymphadenopathy. LUNGS AND PLEURA: Consolidative right perihilar upper lobe peribronchovascular consolidation. Patchy consolidative airspace opacities throughout the remainder of the upper lobe as well as right middle lobe to a lesser extent. Centrilobular emphysematous changes. Possible area of cavitation within the right upper lobe measuring 1.3 x 1.1 cm (series 3, image 88). No pleural effusion or pneumothorax. SOFT TISSUES/BONES: No acute abnormality of the bones or soft tissues. UPPER ABDOMEN: Limited images of the upper abdomen demonstrates colonic diverticulosis. IMPRESSION: 1. Right perihilar upper lobe peribronchovascular consolidation with possible rul cavitation measuring 1.3 x 1.1 cm. Additional patchy consolidative airspace opacities in the right upper lobe and, to a lesser extent, the right middle lobe, most consistent with pneumonia. 2. Prominent but not enlarged mediastinal lymph nodes without gross hilar adenopathy - limited evaluation on this noncontrast study. 3. Emphysema. 4. Recommend follow-up chest imaging to document resolution. Electronically signed by: Morgane Naveau MD 06/22/2024 05:43 PM EST RP Workstation: [id number]   DG Chest 2 View Result Date: 06/22/2024 EXAM: 2 VIEW(S) XRAY OF THE CHEST 06/22/2024 03:20:06 PM COMPARISON: 06/15/2024 CLINICAL HISTORY: sob FINDINGS: LUNGS AND PLEURA: Ill-defined opacities in right suprahilar region. No pleural effusion. No pneumothorax. HEART AND MEDIASTINUM: No acute abnormality of the cardiac and mediastinal silhouettes. BONES AND SOFT TISSUES: No acute osseous abnormality. IMPRESSION: 1. Ill-defined opacities in the right suprahilar region. Electronically signed by: Morgane Naveau MD  06/22/2024 04:15 PM EST RP Workstation: [id number]   DG Chest 2 View Result Date: 06/15/2024 CLINICAL DATA:  Cough for 2 days EXAM: CHEST - 2 VIEW COMPARISON:  December 14, 2022 FINDINGS: The heart size and mediastinal contours are within normal limits. Both lungs are clear. The visualized skeletal structures are unremarkable. IMPRESSION: No active cardiopulmonary disease. Electronically Signed   By: Lynwood Landy Raddle M.D.   On: 06/15/2024 15:13   Labs:   Basic Metabolic Panel: Recent Labs  Lab 06/22/24 1513 06/23/24 0509  NA 139 137  K 3.9 4.4  CL 101 99  CO2 23 25  GLUCOSE  148* 215*  BUN 16 23  CREATININE 0.73 0.71  CALCIUM  9.6 9.6     CBC: Recent Labs  Lab 06/22/24 1513 06/23/24 0509  WBC 13.5* 8.5  HGB 16.7 15.7  HCT 48.3 45.4  MCV 94.0 93.8  PLT 199 204         SIGNED:   True Atlas, MD  [facility] 06/26/2024, 11:39 AM Time taking on discharge: 50 minutes

## 2024-06-25 NOTE — TOC Transition Note (Signed)
 Transition of Care [facility]) - Discharge Note  Patient Details  Name: Sean Martinez MRN: [id number] Date of Birth: August 21, 1960  Transition of Care [facility]) CM/SW Contact:  Duwaine GORMAN Aran, LCSW Phone Number: 06/25/2024, 1:28 PM  Clinical Narrative: Patient will need a nebulizer and home oxygen. Patient does not have a DME company preference. CSW made DME referral to Zack with [employer]. [employer] to deliver nebulizer and travel O2 tank to room. CSW updated patient and RN. Care management signing off.  Final next level of care: Home/Self Care Barriers to Discharge: Barriers Resolved  Patient Goals and CMS Choice Patient states their goals for this hospitalization and ongoing recovery are:: Home CMS Medicare.gov Compare Post Acute Care list provided to:: Patient Choice offered to / list presented to : Patient  Discharge Plan and Services Additional resources added to the After Visit Summary for           DME Arranged: Oxygen, Nebulizer/meds DME Agency: [employer] Date DME Agency Contacted: 06/25/24 Representative spoke with at DME Agency: Zack  Social Drivers of Health (SDOH) Interventions SDOH Screenings   Food Insecurity: No Food Insecurity (06/22/2024)  Housing: Low Risk (06/22/2024)  Transportation Needs: No Transportation Needs (06/22/2024)  Utilities: Not At Risk (06/22/2024)  Depression (PHQ2-9): Low Risk (04/30/2024)  Tobacco Use: High Risk (06/23/2024)   Readmission Risk Interventions    06/24/2024    9:16 AM 07/11/2023    1:06 PM  Readmission Risk Prevention Plan  Post Dischage Appt Complete   Medication Screening Complete   Transportation Screening Complete Complete  PCP or Specialist Appt within 5-7 Days  Complete  Home Care Screening  Complete  Medication Review (RN CM)  Complete

## 2024-06-25 NOTE — Plan of Care (Signed)
" °  Problem: Metabolic: Goal: Ability to maintain appropriate glucose levels will improve Outcome: Progressing   Problem: Nutritional: Goal: Maintenance of adequate nutrition will improve Outcome: Progressing   Problem: Clinical Measurements: Goal: Ability to maintain clinical measurements within normal limits will improve Outcome: Progressing   Problem: Elimination: Goal: Will not experience complications related to bowel motility Outcome: Progressing Goal: Will not experience complications related to urinary retention Outcome: Progressing   Problem: Pain Managment: Goal: General experience of comfort will improve and/or be controlled Outcome: Progressing   Problem: Respiratory: Goal: Ability to maintain a clear airway will improve Outcome: Progressing   Problem: Activity: Goal: Ability to tolerate increased activity will improve Outcome: Progressing   "

## 2024-06-25 NOTE — Plan of Care (Signed)
 " Problem: Education: Goal: Ability to describe self-care measures that may prevent or decrease complications (Diabetes Survival Skills Education) will improve 06/25/2024 0926 by Alaina Dozier PARAS, RN Outcome: Adequate for Discharge 06/25/2024 0755 by Alaina Dozier PARAS, RN Outcome: Progressing Goal: Individualized Educational Video(s) 06/25/2024 0926 by Alaina Dozier PARAS, RN Outcome: Adequate for Discharge 06/25/2024 0755 by Alaina Dozier PARAS, RN Outcome: Progressing   Problem: Coping: Goal: Ability to adjust to condition or change in health will improve 06/25/2024 0926 by Alaina Dozier PARAS, RN Outcome: Adequate for Discharge 06/25/2024 0755 by Alaina Dozier PARAS, RN Outcome: Progressing   Problem: Fluid Volume: Goal: Ability to maintain a balanced intake and output will improve 06/25/2024 0926 by Alaina Dozier PARAS, RN Outcome: Adequate for Discharge 06/25/2024 0755 by Alaina Dozier PARAS, RN Outcome: Progressing   Problem: Health Behavior/Discharge Planning: Goal: Ability to identify and utilize available resources and services will improve 06/25/2024 0926 by Alaina Dozier PARAS, RN Outcome: Adequate for Discharge 06/25/2024 0755 by Alaina Dozier PARAS, RN Outcome: Progressing Goal: Ability to manage health-related needs will improve Outcome: Adequate for Discharge   Problem: Metabolic: Goal: Ability to maintain appropriate glucose levels will improve Outcome: Adequate for Discharge   Problem: Nutritional: Goal: Maintenance of adequate nutrition will improve Outcome: Adequate for Discharge Goal: Progress toward achieving an optimal weight will improve Outcome: Adequate for Discharge   Problem: Skin Integrity: Goal: Risk for impaired skin integrity will decrease Outcome: Adequate for Discharge   Problem: Tissue Perfusion: Goal: Adequacy of tissue perfusion will improve Outcome: Adequate for Discharge   Problem: Education: Goal: Knowledge of General  Education information will improve Description: Including pain rating scale, medication(s)/side effects and non-pharmacologic comfort measures Outcome: Adequate for Discharge   Problem: Health Behavior/Discharge Planning: Goal: Ability to manage health-related needs will improve Outcome: Adequate for Discharge   Problem: Clinical Measurements: Goal: Ability to maintain clinical measurements within normal limits will improve Outcome: Adequate for Discharge Goal: Will remain free from infection Outcome: Adequate for Discharge Goal: Diagnostic test results will improve Outcome: Adequate for Discharge Goal: Respiratory complications will improve Outcome: Adequate for Discharge Goal: Cardiovascular complication will be avoided Outcome: Adequate for Discharge   Problem: Activity: Goal: Risk for activity intolerance will decrease Outcome: Adequate for Discharge   Problem: Nutrition: Goal: Adequate nutrition will be maintained Outcome: Adequate for Discharge   Problem: Coping: Goal: Level of anxiety will decrease Outcome: Adequate for Discharge   Problem: Elimination: Goal: Will not experience complications related to bowel motility Outcome: Adequate for Discharge Goal: Will not experience complications related to urinary retention Outcome: Adequate for Discharge   Problem: Pain Managment: Goal: General experience of comfort will improve and/or be controlled Outcome: Adequate for Discharge   Problem: Safety: Goal: Ability to remain free from injury will improve Outcome: Adequate for Discharge   Problem: Skin Integrity: Goal: Risk for impaired skin integrity will decrease Outcome: Adequate for Discharge   Problem: Education: Goal: Knowledge of disease or condition will improve Outcome: Adequate for Discharge Goal: Knowledge of the prescribed therapeutic regimen will improve Outcome: Adequate for Discharge Goal: Individualized Educational Video(s) Outcome: Adequate for  Discharge   Problem: Activity: Goal: Ability to tolerate increased activity will improve Outcome: Adequate for Discharge Goal: Will verbalize the importance of balancing activity with adequate rest periods Outcome: Adequate for Discharge   Problem: Respiratory: Goal: Ability to maintain a clear airway will improve Outcome: Adequate for Discharge Goal: Levels of oxygenation will improve Outcome: Adequate for Discharge Goal: Ability to maintain adequate ventilation will  improve Outcome: Adequate for Discharge   Problem: Activity: Goal: Ability to tolerate increased activity will improve Outcome: Adequate for Discharge   Problem: Clinical Measurements: Goal: Ability to maintain a body temperature in the normal range will improve Outcome: Adequate for Discharge   Problem: Respiratory: Goal: Ability to maintain adequate ventilation will improve Outcome: Adequate for Discharge Goal: Ability to maintain a clear airway will improve Outcome: Adequate for Discharge   "

## 2024-06-25 NOTE — Discharge Instructions (Addendum)
 It is imperative that you see your PCP in 1 week. Please speak with your doctor about repeating a CXR in 4-6 weeks.   You were cared for by a hospitalist during your hospital stay. Please review all of you discharge paperwork on the day of discharge and be sure you have all of your prescribed medications and please read the below instructions:  Once you are discharged, your primary care physician will handle any further medical issues. Please note that NO REFILLS for any discharge medications will be authorized once you are discharged as it is imperative that you return to your primary care physician (or establish a relationship with a primary care physician if you do not have one) for your aftercare needs. Please obtain a follow up appointment with your primary care physician within 1-2 weeks of discharge. Please take all your medications with you for your next visit with your Primary MD. Please request your Primary MD to go over all Hospital Tests and Procedures, Radiological results at the follow up appointment. In some cases, there will be blood work, cultures and biopsy results pending at the time of your discharge. Please request that your primary care M.D. goes through all the records of your hospital data and follows up on these results. Please get all hospital records sent to your primary MD by signing hospital release before you go home or request your primary care doctor's office to assist with obtaining medical records.   You must read complete instructions/literature along with all the possible adverse reactions/side effects for all the medicines that have been prescribed to you. Take any new medicines after you have completely understood and accpet all the possible adverse reactions/side effects.  Please take medications as prescribed and speak with your doctor if changes are needed.   If you have smoked or chewed tobacco in the last 2 yrs please stop. Stop any regular alcohol  and or  any recreational drug use. Wear Seat belts while driving.   If you had Pneumonia at the Hospital: Please get a 2 view Chest X ray done in 6-8 weeks after hospital discharge or sooner if instructed by your Primary MD.   If you have Congestive Heart Failure: Follow a cardiac low salt diet and 1.5 lit/day fluid restriction. Please call your Cardiologist or Primary MD anytime you have any of the following symptoms:  1) 3 pound weight gain in 24 hours or 5 pounds in 1 week  2) shortness of breath, with or without a dry hacking cough  3) increasing swelling in the feet or stomach  4) if you have to sleep on extra pillows at night in order to breathe   If you have Diabetes: Check blood sugars 4 times/day- once on AM empty stomach and then before each meal. Log in all results and show them to your primary doctor at your next visit. If glucose readings are often under 60 or above 400 call your primary MD to see if medication dosages need to be adjusted   If you have Syncope (passing out) or Seizure/Convulsions/Epilepsy: Please do not drive, operate heavy machinery, participate in activities at heights or participate in high speed sports until you have seen by Primary MD or a Neurologist and advised to do so again. Per [place]  DM[place] statutes, patients with seizures are not allowed to drive until they have been seizure-free for six months.  Use caution when using heavy equipment or power tools. Avoid working on ladders or at heights. Take showers  instead of baths. Ensure the water temperature is not too high on the home water heater. Do not go swimming alone. Do not lock yourself in a room alone (i.e. bathroom). When caring for infants or small children, sit down when holding, feeding, or changing them to minimize risk of injury to the child in the event you have a seizure. Maintain good sleep hygiene. Avoid alcohol.    If you had Gastrointestinal Bleeding: Please ask your Primary MD to check a  complete blood count within one week of discharge or at your next visit. Your endoscopic/colonoscopic biopsies that are pending at the time of discharge will also need to followed by your Primary MD.    [facility] can reach the hospitalist office at phone [phone] or fax [phone]   If you do not have a primary care physician, you can call [phone] for a physician referral.

## 2024-06-25 NOTE — Progress Notes (Addendum)
 SATURATION QUALIFICATIONS: (This note is used to comply with regulatory documentation for home oxygen)  Patient Saturations on Room Air at Rest = 96%  Patient Saturations on Room Air while Ambulating = 88%  Patient Saturations on 2 Liters of oxygen while Ambulating = 96%  Please briefly explain why patient needs home oxygen: Spo2 desturation to 88% when ambulating, with shortness of breath

## 2024-06-25 NOTE — Plan of Care (Signed)

## 2024-06-26 ENCOUNTER — Other Ambulatory Visit (HOSPITAL_COMMUNITY): Payer: Self-pay

## 2024-06-26 LAB — GLUCOSE, CAPILLARY
Glucose-Capillary: 161 mg/dL — ABNORMAL HIGH (ref 70–99)
Glucose-Capillary: 140 mg/dL — ABNORMAL HIGH (ref 70–99)

## 2024-06-26 LAB — CULTURE, RESPIRATORY W GRAM STAIN

## 2024-06-26 MED ORDER — LEVOFLOXACIN 750 MG PO TABS
750.0000 mg | ORAL_TABLET | Freq: Every day | ORAL | 0 refills | Status: AC
  Filled 2024-06-26: qty 7, 7d supply, fill #0

## 2024-06-26 MED ORDER — LEVOFLOXACIN 500 MG PO TABS
750.0000 mg | ORAL_TABLET | Freq: Every day | ORAL | Status: DC
  Administered 2024-06-26: 750 mg via ORAL
  Filled 2024-06-26: qty 2

## 2024-06-26 NOTE — Progress Notes (Signed)
 Patient discharged home, IV removed, RN made sure patient had discharge paperwork and it had been explained, patient verbalized understanding, ordered nebulizer and home O2 delivered by [employer] prior to patient discharged.

## 2024-06-26 NOTE — Progress Notes (Signed)
 Revised discharge mediations delivered to patient at the bedside

## 2024-06-27 ENCOUNTER — Ambulatory Visit: Admitting: Family Medicine

## 2024-06-27 ENCOUNTER — Encounter: Payer: Self-pay | Admitting: Family Medicine

## 2024-06-27 ENCOUNTER — Telehealth: Payer: Self-pay

## 2024-06-27 VITALS — BP 143/91 | HR 99 | Temp 98.1°F | Ht 72.0 in | Wt 171.0 lb

## 2024-06-27 DIAGNOSIS — J189 Pneumonia, unspecified organism: Secondary | ICD-10-CM

## 2024-06-27 DIAGNOSIS — E782 Mixed hyperlipidemia: Secondary | ICD-10-CM

## 2024-06-27 DIAGNOSIS — I252 Old myocardial infarction: Secondary | ICD-10-CM

## 2024-06-27 DIAGNOSIS — J9601 Acute respiratory failure with hypoxia: Secondary | ICD-10-CM

## 2024-06-27 DIAGNOSIS — Z9981 Dependence on supplemental oxygen: Secondary | ICD-10-CM | POA: Insufficient documentation

## 2024-06-27 DIAGNOSIS — I152 Hypertension secondary to endocrine disorders: Secondary | ICD-10-CM

## 2024-06-27 DIAGNOSIS — E1129 Type 2 diabetes mellitus with other diabetic kidney complication: Secondary | ICD-10-CM

## 2024-06-27 DIAGNOSIS — J441 Chronic obstructive pulmonary disease with (acute) exacerbation: Secondary | ICD-10-CM

## 2024-06-27 DIAGNOSIS — Z91199 Patient's noncompliance with other medical treatment and regimen due to unspecified reason: Secondary | ICD-10-CM

## 2024-06-27 LAB — CULTURE, BLOOD (ROUTINE X 2)
Culture: NO GROWTH
Culture: NO GROWTH
Special Requests: ADEQUATE
Special Requests: ADEQUATE

## 2024-06-27 MED ORDER — BLOOD GLUCOSE TEST VI STRP: 1.0000 | ORAL_STRIP | 0 refills | Status: DC

## 2024-06-27 MED ORDER — ASPIRIN 81 MG PO TBEC: 81.0000 mg | DELAYED_RELEASE_TABLET | Freq: Every day | ORAL | Status: AC

## 2024-06-27 MED ORDER — IPRATROPIUM-ALBUTEROL 0.5-2.5 (3) MG/3ML IN SOLN: 3.0000 mL | Freq: Once | RESPIRATORY_TRACT | Status: AC

## 2024-06-27 MED ORDER — LANCETS MISC: 1.0000 | 0 refills | Status: AC

## 2024-06-27 MED ORDER — FREESTYLE LIBRE 3 PLUS SENSOR MISC: 3 refills | Status: AC

## 2024-06-27 MED ORDER — BLOOD GLUCOSE MONITORING SUPPL DEVI: 1.0000 | 0 refills | Status: AC

## 2024-06-27 MED ORDER — LANCET DEVICE MISC: 1.0000 | 0 refills | Status: AC

## 2024-06-27 NOTE — Patient Instructions (Signed)
 Visit Information  Thank you for taking time to visit with me today. Please don't hesitate to contact me if I can be of assistance to you before our next scheduled telephone appointment.  Our next appointment is by telephone on 05/06/25 at 1300  Following is a copy of your care plan:   Goals Addressed             This Visit's Progress    [facility] Transitions of Care (TOC) Care Plan       Problems:  Recent Hospitalization for treatment of COPDCommunity Acquired Pneumonia Knowledge Deficit Related to Community Acquired Pneumonia  Goal:  Over the next 30 days, the patient will not experience hospital readmission  Interventions:  Transitions of Care: Doctor Visits  - discussed the importance of doctor visits  COPD Interventions: Advised patient to track and manage COPD triggers Advised patient to self assesses COPD action plan zone and make appointment with provider if in the yellow zone for 48 hours without improvement Assessed social determinant of health barriers Discussed the importance of adequate rest and management of fatigue with COPD Provided education about and advised patient to utilize infection prevention strategies to reduce risk of respiratory infection Screening for signs and symptoms of depression related to chronic disease state  Use of home oxygen  Patient Self Care Activities:  Attend all scheduled provider appointments Call pharmacy for medication refills 3-7 days in advance of running out of medications Call provider office for new concerns or questions  Notify RN Care Manager of TOC call rescheduling needs Participate in Transition of Care Program/Attend TOC scheduled calls Take medications as prescribed    Plan:  The patient has been provided with contact information for the care management team and has been advised to call with any health related questions or concerns.         Patient verbalizes understanding of instructions and care plan provided  today and agrees to view in MyChart. Active MyChart status and patient understanding of how to access instructions and care plan via MyChart confirmed with patient.     The patient has been provided with contact information for the care management team and has been advised to call with any health related questions or concerns.   Please call the care guide team at [phone] if you need to cancel or reschedule your appointment.   Please call the [place]  National Suicide Prevention Lifeline: 647-631-1684 or TTY: 734-444-9448 TTY (630) 224-8201) to talk to a trained counselor if you are experiencing a Mental Health or Behavioral Health Crisis or need someone to talk to.  Richerd Fish, RN, BSN, CCM [facility], [facility] Management Coordinator Direct Dial: [phone]

## 2024-06-27 NOTE — Progress Notes (Deleted)
 " Assessment & Plan   Assessment/Plan:     Assessment and Plan Assessment & Plan       Medications Discontinued During This Encounter  Medication Reason   Blood Glucose Monitoring Suppl DEVI     Return in about 1 month (around 07/28/2024).        Subjective:   Encounter date: 06/27/2024  Sean Martinez is a 64 y.o. male who has Tobacco abuse; Chronic cough; Type 2 diabetes mellitus with hyperglycemia, without long-term current use of insulin  (HCC); COPD with acute exacerbation (HCC); Nonadherence to medical treatment; History of MI (myocardial infarction); Acute idiopathic gout of right knee; Pain and swelling of knee, right; SBO (small bowel obstruction) (HCC); Continuous dependence on cigarette smoking; Hypertension associated with diabetes (HCC); Hyperlipidemia; Type 2 diabetes mellitus with proteinuria (HCC); Small bowel obstruction (HCC); Hyponatremia; Erythrocytosis due to pulmonary disease; Community acquired pneumonia of right upper lobe of lung; and Acute respiratory failure with hypoxia (HCC) on their problem list..   He  has a past medical history of Asthma, Coronary artery disease, MI, old, and Type 2 diabetes mellitus with hyperglycemia, without long-term current use of insulin  (HCC) (08/10/2022).SABRA   He presents with chief complaint of Follow-up (Patient is following up after an ER visit. Wants to know how long he will be on oxygen.) .   Discussed the use of AI scribe software for clinical note transcription with the patient, who gave verbal consent to proceed.  History of Present Illness      ROS  Past Surgical History:  Procedure Laterality Date   HERNIA REPAIR     LEFT HEART CATHETERIZATION WITH CORONARY ANGIOGRAM N/A 07/17/2012   Procedure: LEFT HEART CATHETERIZATION WITH CORONARY ANGIOGRAM;  Surgeon: Erick JONELLE Bergamo, MD;  Location: [facility] CATH LAB;  Service: Cardiovascular;  Laterality: N/A;   VENTRAL HERNIA REPAIR N/A 07/09/2023   Procedure: OPEN  RIGHT INGUINAL HERNIA REPAIR; DIAGNOSTIC LAPAROSCOPY;  Surgeon: Polly Cordella LABOR, MD;  Location: [facility] ORS;  Service: General;  Laterality: N/A;    Outpatient Medications Prior to Visit  Medication Sig Dispense Refill   acetaminophen  (TYLENOL ) 325 MG tablet Take 2 tablets (650 mg total) by mouth every 6 (six) hours as needed for mild pain (pain score 1-3) or fever.     albuterol  (VENTOLIN  HFA) 108 (90 Base) MCG/ACT inhaler Inhale 1-2 puffs into the lungs every 6 (six) hours as needed for wheezing or shortness of breath. 18 g 11   arformoterol  (BROVANA ) 15 MCG/2ML NEBU Inhale 2 mLs (15 mcg total) by nebulization 2 (two) times daily. 120 mL 0   budesonide  (PULMICORT ) 0.25 MG/2ML nebulizer solution Inhale 2 mLs (0.25 mg total) by nebulization 2 (two) times daily. 60 mL 12   budesonide -glycopyrrolate-formoterol (BREZTRI  AEROSPHERE) 160-9-4.8 MCG/ACT AERO inhaler Inhale 2 puffs into the lungs 2 (two) times daily. 10.7 g 11   Dextromethorphan -guaiFENesin  (ROBAFEN DM) 20-200 MG/20ML LIQD Take 5 mLs by mouth every 4 (four) hours as needed (chest congestion). 118 mL 0   empagliflozin  (JARDIANCE ) 10 MG TABS tablet Take 1 tablet (10 mg total) by mouth daily. 30 tablet 1   glipiZIDE  (GLUCOTROL  XL) 5 MG 24 hr tablet Please take this if your fasting AM blood glucose is > 150 20 tablet 0   guaiFENesin  (MUCINEX ) 600 MG 12 hr tablet Take 1 tablet (600 mg total) by mouth 2 (two) times daily. 60 tablet 0   ibuprofen  (ADVIL ) 400 MG tablet Take 1 tablet (400 mg total) by mouth every 4 (four) hours as  needed for fever or headache. 30 tablet 0   ipratropium-albuterol  (DUONEB) 0.5-2.5 (3) MG/3ML SOLN Inhale 3 mLs by nebulization every 6 (six) hours as needed. 360 mL 0   levofloxacin  (LEVAQUIN ) 750 MG tablet Take 1 tablet (750 mg total) by mouth daily. 7 tablet 0   losartan  (COZAAR ) 25 MG tablet Take 0.5 tablets (12.5 mg total) by mouth daily. 45 tablet 3   metFORMIN  (GLUCOPHAGE -XR) 750 MG 24 hr tablet Take 750 mg by mouth  daily with breakfast.     nicotine  (NICODERM CQ  - DOSED IN MG/24 HOURS) 21 mg/24hr patch Place 1 patch (21 mg total) onto the skin daily. 28 patch 0   OXYGEN Inhale 2 L into the lungs continuous.     predniSONE  (DELTASONE ) 10 MG tablet Take 6 tablets by mouth daily with breakfast for 3 days, THEN take 4 tablets daily for 3 days, THEN take 2 tablets daily for 3 days, THEN take 1 tablet daily for 3 days, THEN take  0.5 tablets daily for 3 days. 41 tablet 0   rosuvastatin  (CRESTOR ) 5 MG tablet Take 1 tablet by mouth once daily 90 tablet 0   senna-docusate (SENOKOT-S) 8.6-50 MG tablet Take 1 tablet by mouth at bedtime as needed for mild constipation.     tamsulosin  (FLOMAX ) 0.4 MG CAPS capsule Take 1 capsule (0.4 mg total) by mouth daily. 30 capsule 3   Colchicine  0.6 MG CAPS At onset of flair, Take 1.2 mg (two tablets). Take 0.6 mg (one tablet) one hour later. Then, take 0.6 mg (one tablet) once daily for up to 7 days as needed for swelling and pain. (Patient not taking: Reported on 06/23/2024) 30 capsule 0   Doxylamine Succinate, Sleep, (SLEEP AID PO) Take 1 tablet by mouth at bedtime as needed. (Patient not taking: Reported on 06/27/2024)     Blood Glucose Monitoring Suppl DEVI 1 each by Does not apply route in the morning, at noon, and at bedtime. May substitute to any manufacturer covered by patient's insurance. (Patient not taking: Reported on 06/27/2024) 1 each 0   No facility-administered medications prior to visit.    Family History  Problem Relation Age of Onset   Diabetes Mellitus II Mother     Social History   Socioeconomic History   Marital status: Single    Spouse name: Not on file   Number of children: Not on file   Years of education: Not on file   Highest education level: Not on file  Occupational History   Not on file  Tobacco Use   Smoking status: Every Day    Average packs/day: 0.3 packs/day for 46.6 years (11.7 ttl pk-yrs)    Types: Cigarettes    Start date: 31     Passive exposure: Never   Smokeless tobacco: Never  Vaping Use   Vaping status: Never Used  Substance and Sexual Activity   Alcohol use: Not Currently   Drug use: Not Currently   Sexual activity: Not on file  Other Topics Concern   Not on file  Social History Narrative   Not on file   Social Drivers of Health   Tobacco Use: High Risk (06/23/2024)   Patient History    Smoking Tobacco Use: Every Day    Smokeless Tobacco Use: Never    Passive Exposure: Never  Financial Resource Strain: Not on file  Food Insecurity: No Food Insecurity (06/22/2024)   Epic    Worried About Radiation Protection Practitioner of Food in the Last Year: Never true  Ran Out of Food in the Last Year: Never true  Transportation Needs: No Transportation Needs (06/22/2024)   Epic    Lack of Transportation (Medical): No    Lack of Transportation (Non-Medical): No  Physical Activity: Not on file  Stress: Not on file  Social Connections: Not on file  Intimate Partner Violence: Not At Risk (06/22/2024)   Epic    Fear of Current or Ex-Partner: No    Emotionally Abused: No    Physically Abused: No    Sexually Abused: No  Depression (PHQ2-9): Low Risk (06/27/2024)   Depression (PHQ2-9)    PHQ-2 Score: 0  Alcohol Screen: Not on file  Housing: Low Risk (06/22/2024)   Epic    Unable to Pay for Housing in the Last Year: No    Number of Times Moved in the Last Year: 0    Homeless in the Last Year: No  Utilities: Not At Risk (06/22/2024)   Epic    Threatened with loss of utilities: No  Health Literacy: Not on file                                                                                                  Objective:  Physical Exam: BP (!) 143/91 (BP Location: Left Arm, Patient Position: Sitting, Cuff Size: Normal)   Pulse 99   Temp 98.1 F (36.7 C)   Ht 6' (1.829 m)   Wt 171 lb (77.6 kg)   SpO2 91%   BMI 23.19 kg/m    Physical Exam    Physical Exam  CT CHEST WO CONTRAST Result Date: 06/22/2024 EXAM: CT CHEST WITHOUT  CONTRAST 06/22/2024 04:50:47 PM TECHNIQUE: CT of the chest was performed without the administration of intravenous contrast. Multiplanar reformatted images are provided for review. Automated exposure control, iterative reconstruction, and/or weight based adjustment of the mA/kV was utilized to reduce the radiation dose to as low as reasonably achievable. COMPARISON: None available. CLINICAL HISTORY: Respiratory illness, nondiagnostic xray. FINDINGS: MEDIASTINUM: Heart and pericardium are unremarkable. The central airways are clear. The main pulmonary artery is normal in caliber. The thoracic aorta is normal in caliber. LYMPH NODES: Prominent but not enlarged mediastinal lymph nodes. No gross hilar adenopathy with limited evaluation on this noncontrast study. No axillary lymphadenopathy. LUNGS AND PLEURA: Consolidative right perihilar upper lobe peribronchovascular consolidation. Patchy consolidative airspace opacities throughout the remainder of the upper lobe as well as right middle lobe to a lesser extent. Centrilobular emphysematous changes. Possible area of cavitation within the right upper lobe measuring 1.3 x 1.1 cm (series 3, image 88). No pleural effusion or pneumothorax. SOFT TISSUES/BONES: No acute abnormality of the bones or soft tissues. UPPER ABDOMEN: Limited images of the upper abdomen demonstrates colonic diverticulosis. IMPRESSION: 1. Right perihilar upper lobe peribronchovascular consolidation with possible rul cavitation measuring 1.3 x 1.1 cm. Additional patchy consolidative airspace opacities in the right upper lobe and, to a lesser extent, the right middle lobe, most consistent with pneumonia. 2. Prominent but not enlarged mediastinal lymph nodes without gross hilar adenopathy - limited evaluation on this noncontrast study. 3. Emphysema. 4. Recommend follow-up  chest imaging to document resolution. Electronically signed by: Morgane Naveau MD 06/22/2024 05:43 PM EST RP Workstation: [id number]    DG Chest 2 View Result Date: 06/22/2024 EXAM: 2 VIEW(S) XRAY OF THE CHEST 06/22/2024 03:20:06 PM COMPARISON: 06/15/2024 CLINICAL HISTORY: sob FINDINGS: LUNGS AND PLEURA: Ill-defined opacities in right suprahilar region. No pleural effusion. No pneumothorax. HEART AND MEDIASTINUM: No acute abnormality of the cardiac and mediastinal silhouettes. BONES AND SOFT TISSUES: No acute osseous abnormality. IMPRESSION: 1. Ill-defined opacities in the right suprahilar region. Electronically signed by: Morgane Naveau MD 06/22/2024 04:15 PM EST RP Workstation: [id number]   DG Chest 2 View Result Date: 06/15/2024 CLINICAL DATA:  Cough for 2 days EXAM: CHEST - 2 VIEW COMPARISON:  December 14, 2022 FINDINGS: The heart size and mediastinal contours are within normal limits. Both lungs are clear. The visualized skeletal structures are unremarkable. IMPRESSION: No active cardiopulmonary disease. Electronically Signed   By: Lynwood Landy Raddle M.D.   On: 06/15/2024 15:13    Recent Results (from the past 2160 hours)  POC urinalysis dipstick     Status: Abnormal   Collection Time: 04/19/24 12:31 PM  Result Value Ref Range   Color, UA yellow yellow   Clarity, UA turbid (A) clear   Glucose, UA >=1,000 (A) negative mg/dL   Bilirubin, UA negative negative   Ketones, POC UA negative negative mg/dL   Spec Grav, UA >=8.969 (A) 1.010 - 1.025   Blood, UA negative negative   pH, UA 5.5 5.0 - 8.0   Protein Ur, POC =30 (A) negative mg/dL   Urobilinogen, UA 0.2 0.2 or 1.0 E.U./dL   Nitrite, UA Negative Negative   Leukocytes, UA Negative Negative  Urine Culture     Status: Abnormal   Collection Time: 04/19/24 12:41 PM   Specimen: Urine, Clean Catch  Result Value Ref Range   Specimen Description URINE, CLEAN CATCH    Special Requests      NONE Performed at [facility], [address] [place], [place] [address]    Culture MULTIPLE SPECIES PRESENT, SUGGEST RECOLLECTION (A)    Report Status 04/20/2024 FINAL   POC CBG  monitoring     Status: Abnormal   Collection Time: 04/19/24 12:47 PM  Result Value Ref Range   POCT Glucose (KUC) 261 (A) 70 - 99 mg/dL  CBC with Differential     Status: None   Collection Time: 04/19/24 12:49 PM  Result Value Ref Range   WBC 4.0 4.0 - 10.5 K/uL   RBC 5.14 4.22 - 5.81 MIL/uL   Hemoglobin 16.7 13.0 - 17.0 g/dL   HCT 51.1 60.9 - 47.9 %   MCV 94.9 80.0 - 100.0 fL   MCH 32.5 26.0 - 34.0 pg   MCHC 34.2 30.0 - 36.0 g/dL   RDW 86.2 88.4 - 84.4 %   Platelets 153 150 - 400 K/uL   nRBC 0.0 0.0 - 0.2 %   Neutrophils Relative % 52 %   Neutro Abs 2.1 1.7 - 7.7 K/uL   Lymphocytes Relative 37 %   Lymphs Abs 1.5 0.7 - 4.0 K/uL   Monocytes Relative 9 %   Monocytes Absolute 0.4 0.1 - 1.0 K/uL   Eosinophils Relative 1 %   Eosinophils Absolute 0.0 0.0 - 0.5 K/uL   Basophils Relative 1 %   Basophils Absolute 0.0 0.0 - 0.1 K/uL   Immature Granulocytes 0 %   Abs Immature Granulocytes 0.00 0.00 - 0.07 K/uL    Comment: Performed at [facility], [address], [place], [place] [address]  Comprehensive metabolic panel     Status: Abnormal   Collection Time: 04/19/24 12:49 PM  Result Value Ref Range   Sodium 135 135 - 145 mmol/L   Potassium 4.8 3.5 - 5.1 mmol/L    Comment: HEMOLYSIS AT THIS LEVEL MAY AFFECT RESULT   Chloride 98 98 - 111 mmol/L   CO2 26 22 - 32 mmol/L   Glucose, Bld 224 (H) 70 - 99 mg/dL    Comment: Glucose reference range applies only to samples taken after fasting for at least 8 hours.   BUN 17 8 - 23 mg/dL   Creatinine, Ser 9.20 0.61 - 1.24 mg/dL   Calcium  9.2 8.9 - 10.3 mg/dL   Total Protein 6.6 6.5 - 8.1 g/dL   Albumin  3.7 3.5 - 5.0 g/dL   AST 27 15 - 41 U/L    Comment: HEMOLYSIS AT THIS LEVEL MAY AFFECT RESULT   ALT 26 0 - 44 U/L    Comment: HEMOLYSIS AT THIS LEVEL MAY AFFECT RESULT   Alkaline Phosphatase 67 38 - 126 U/L   Total Bilirubin 1.2 0.0 - 1.2 mg/dL    Comment: HEMOLYSIS AT THIS LEVEL MAY AFFECT RESULT   GFR, Estimated >60 >60 mL/min     Comment: (NOTE) Calculated using the CKD-EPI Creatinine Equation (2021)    Anion gap 11 5 - 15    Comment: Performed at [facility], [address], [place], [place] [address]  POCT glycosylated hemoglobin (Hb A1C)     Status: Abnormal   Collection Time: 04/30/24  4:36 PM  Result Value Ref Range   Hemoglobin A1C 10.5 (A) 4.0 - 5.6 %   HbA1c POC (<> result, manual entry) 10.5 4.0 - 5.6 %   HbA1c, POC (prediabetic range) 10.5 (A) 5.7 - 6.4 %   HbA1c, POC (controlled diabetic range) 10.5 (A) 0.0 - 7.0 %  POCT urinalysis dipstick     Status: Abnormal   Collection Time: 04/30/24  4:37 PM  Result Value Ref Range   Color, UA     Clarity, UA     Glucose, UA Positive (A) Negative    Comment: 1000mg /dL   Bilirubin, UA Negative    Ketones, UA Negative    Spec Grav, UA 1.015 1.010 - 1.025   Blood, UA Negative    pH, UA 6.0 5.0 - 8.0   Protein, UA Positive (A) Negative    Comment: 15mg    Urobilinogen, UA 0.2 0.2 or 1.0 E.U./dL   Nitrite, UA Negative    Leukocytes, UA Negative Negative   Appearance     Odor    POC Covid19/Flu A&B Antigen     Status: None   Collection Time: 06/15/24  2:27 PM  Result Value Ref Range   Influenza A Antigen, POC Negative Negative   Influenza B Antigen, POC Negative Negative   Covid Antigen, POC Negative Negative  POCT glucose (manual entry)     Status: Abnormal   Collection Time: 06/15/24  3:09 PM  Result Value Ref Range   POCT Glucose (KUC) 115 (A) 70 - 99 mg/dL  Basic metabolic panel     Status: Abnormal   Collection Time: 06/22/24  3:13 PM  Result Value Ref Range   Sodium 139 135 - 145 mmol/L   Potassium 3.9 3.5 - 5.1 mmol/L   Chloride 101 98 - 111 mmol/L   CO2 23 22 - 32 mmol/L   Glucose, Bld 148 (H) 70 -  99 mg/dL    Comment: Glucose reference range applies only to samples taken after fasting for at least 8 hours.   BUN 16 8 - 23 mg/dL   Creatinine, Ser 9.26 0.61 - 1.24 mg/dL   Calcium  9.6 8.9 - 10.3 mg/dL   GFR, Estimated >39 >39 mL/min     Comment: (NOTE) Calculated using the CKD-EPI Creatinine Equation (2021)    Anion gap 14 5 - 15    Comment: Performed at [facility], [address], [place], [place] [address]  CBC     Status: Abnormal   Collection Time: 06/22/24  3:13 PM  Result Value Ref Range   WBC 13.5 (H) 4.0 - 10.5 K/uL   RBC 5.14 4.22 - 5.81 MIL/uL   Hemoglobin 16.7 13.0 - 17.0 g/dL   HCT 51.6 60.9 - 47.9 %   MCV 94.0 80.0 - 100.0 fL   MCH 32.5 26.0 - 34.0 pg   MCHC 34.6 30.0 - 36.0 g/dL   RDW 87.0 88.4 - 84.4 %   Platelets 199 150 - 400 K/uL   nRBC 0.0 0.0 - 0.2 %    Comment: Performed at [facility] [address], [place], [place] [address]  Resp panel by RT-PCR (RSV, Flu A&B, Covid) Anterior Nasal Swab     Status: None   Collection Time: 06/22/24  3:27 PM   Specimen: Anterior Nasal Swab  Result Value Ref Range   SARS Coronavirus 2 by RT PCR NEGATIVE NEGATIVE    Comment: (NOTE) SARS-CoV-2 target nucleic acids are NOT DETECTED.  The SARS-CoV-2 RNA is generally detectable in upper respiratory specimens during the acute phase of infection. The lowest concentration of SARS-CoV-2 viral copies this assay can detect is 138 copies/mL. A negative result does not preclude SARS-Cov-2 infection and should not be used as the sole basis for treatment or other patient management decisions. A negative result may occur with  improper specimen collection/handling, submission of specimen other than nasopharyngeal swab, presence of viral mutation(s) within the areas targeted by this assay, and inadequate number of viral copies(<138 copies/mL). A negative result must be combined with clinical observations, patient history, and epidemiological information. The expected result is Negative.  Fact Sheet for Patients:  bloggercourse.com  Fact Sheet for Healthcare Providers:  seriousbroker.it  This test is no t yet approved or  cleared by the [place]  FDA and  has been authorized for detection and/or diagnosis of SARS-CoV-2 by FDA under an Emergency Use Authorization (EUA). This EUA will remain  in effect (meaning this test can be used) for the duration of the COVID-19 declaration under Section 564(b)(1) of the Act, 21 U.S.C.section 360bbb-3(b)(1), unless the authorization is terminated  or revoked sooner.       Influenza A by PCR NEGATIVE NEGATIVE   Influenza B by PCR NEGATIVE NEGATIVE    Comment: (NOTE) The Xpert Xpress SARS-CoV-2/FLU/RSV plus assay is intended as an aid in the diagnosis of influenza from Nasopharyngeal swab specimens and should not be used as a sole basis for treatment. Nasal washings and aspirates are unacceptable for Xpert Xpress SARS-CoV-2/FLU/RSV testing.  Fact Sheet for Patients: bloggercourse.com  Fact Sheet for Healthcare Providers: seriousbroker.it  This test is not yet approved or cleared by the [place]  FDA and has been authorized for detection and/or diagnosis of SARS-CoV-2 by FDA under an Emergency Use Authorization (EUA). This EUA will remain in effect (meaning this test can be used) for the duration of the COVID-19 declaration under Section  564(b)(1) of the Act, 21 U.S.C. section 360bbb-3(b)(1), unless the authorization is terminated or revoked.     Resp Syncytial Virus by PCR NEGATIVE NEGATIVE    Comment: (NOTE) Fact Sheet for Patients: bloggercourse.com  Fact Sheet for Healthcare Providers: seriousbroker.it  This test is not yet approved or cleared by the [place]  FDA and has been authorized for detection and/or diagnosis of SARS-CoV-2 by FDA under an Emergency Use Authorization (EUA). This EUA will remain in effect (meaning this test can be used) for the duration of the COVID-19 declaration under Section 564(b)(1) of the Act, 21 U.S.C. section  360bbb-3(b)(1), unless the authorization is terminated or revoked.  Performed at [facility], [address], [place], [place] [address]   Culture, blood (routine x 2)     Status: None   Collection Time: 06/22/24  8:08 PM   Specimen: BLOOD RIGHT ARM  Result Value Ref Range   Specimen Description      BLOOD RIGHT ARM Performed at [facility], [address], [place], [place] [address]    Special Requests      BOTTLES DRAWN AEROBIC ONLY Blood Culture adequate volume Performed at [facility], [address], [place], [place] [address]    Culture      NO GROWTH 5 DAYS Performed at [facility], [address] [place], [place] [address]    Report Status 06/27/2024 FINAL   Culture, blood (routine x 2)     Status: None   Collection Time: 06/22/24  8:08 PM   Specimen: BLOOD RIGHT ARM  Result Value Ref Range   Specimen Description      BLOOD RIGHT ARM Performed at [facility], [address] [place], [place] [address]    Special Requests      BOTTLES DRAWN AEROBIC ONLY Blood Culture adequate volume Performed at [facility], [address], [place], [place] [address]    Culture      NO GROWTH 5 DAYS Performed at [facility], [address] [place], [place] [address]    Report Status 06/27/2024 FINAL   Glucose, capillary     Status: Abnormal   Collection Time: 06/22/24  8:42 PM  Result Value Ref Range   Glucose-Capillary 271 (H) 70 - 99 mg/dL    Comment: Glucose reference range applies only to samples taken after fasting for at least 8 hours.  Expectorated Sputum Assessment w Gram Stain, Rflx to Resp Cult     Status: None   Collection Time: 06/22/24  9:06 PM   Specimen: Sputum  Result Value Ref Range   Specimen Description SPUTUM    Special Requests NONE    Sputum evaluation      THIS SPECIMEN IS ACCEPTABLE FOR SPUTUM CULTURE Performed at [facility], [address] [place], [place] [address]    Report Status 06/22/2024 FINAL   Culture, Respiratory w Gram Stain     Status: None   Collection Time: 06/22/24  9:06 PM   Specimen: SPU  Result Value Ref Range   Specimen Description      SPUTUM Performed at [facility], [address] [place], [place] [address]    Special Requests      NONE Reflexed from [phone] Performed at [facility], [address], [place], [place] [address]    Gram Stain      MODERATE WBC PRESENT, PREDOMINANTLY PMN MODERATE GRAM POSITIVE  COCCI FEW YEAST RARE GRAM POSITIVE RODS Performed at [facility], [address] [place], [place] [address]    Culture FEW PSEUDOMONAS AERUGINOSA    Report Status 06/26/2024 FINAL    Organism ID, Bacteria PSEUDOMONAS AERUGINOSA       Susceptibility   Pseudomonas aeruginosa - MIC*    MEROPENEM <=0.25 SENSITIVE Sensitive     CIPROFLOXACIN 0.12 SENSITIVE Sensitive     IMIPENEM 2 SENSITIVE Sensitive     PIP/TAZO Value in next row Sensitive      8 SENSITIVEThis is a modified FDA-approved test that has been validated and its performance characteristics determined by the reporting laboratory.  This laboratory is certified under the Clinical Laboratory Improvement Amendments CLIA as qualified to perform high complexity clinical laboratory testing.    CEFEPIME Value in next row Sensitive      8 SENSITIVEThis is a modified FDA-approved test that has been validated and its performance characteristics determined by the reporting laboratory.  This laboratory is certified under the Clinical Laboratory Improvement Amendments CLIA as qualified to perform high complexity clinical laboratory testing.    CEFTAZIDIME/AVIBACTAM Value in next row Sensitive      8 SENSITIVEThis is a modified FDA-approved test that has been validated and its performance characteristics determined by the reporting laboratory.  This laboratory is certified under the Clinical Laboratory  Improvement Amendments CLIA as qualified to perform high complexity clinical laboratory testing.    CEFTOLOZANE/TAZOBACTAM Value in next row Sensitive      8 SENSITIVEThis is a modified FDA-approved test that has been validated and its performance characteristics determined by the reporting laboratory.  This laboratory is certified under the Clinical Laboratory Improvement Amendments CLIA as qualified to perform high complexity clinical laboratory testing.    TOBRAMYCIN Value in next row Sensitive      8 SENSITIVEThis is a modified FDA-approved test that has been validated and its performance characteristics determined by the reporting laboratory.  This laboratory is certified under the Clinical Laboratory Improvement Amendments CLIA as qualified to perform high complexity clinical laboratory testing.    CEFTAZIDIME Value in next row Sensitive      8 SENSITIVEThis is a modified FDA-approved test that has been validated and its performance characteristics determined by the reporting laboratory.  This laboratory is certified under the Clinical Laboratory Improvement Amendments CLIA as qualified to perform high complexity clinical laboratory testing.    * FEW PSEUDOMONAS AERUGINOSA  Legionella Pneumophila Serogp 1 Ur Ag     Status: None   Collection Time: 06/22/24 11:39 PM  Result Value Ref Range   L. pneumophila Serogp 1 Ur Ag Negative Negative    Comment: (NOTE) Presumptive negative for L. pneumophila serogroup 1 antigen in urine, suggesting no recent or current infection. Legionnaires' disease cannot be ruled out since other serogroups and species may also cause disease. Performed At: [facility] [address] [place], [place] [id number] Jennette Shorter MD Ey:[id number]    Source of Sample URINE, RANDOM     Comment: Performed at [facility], [address], [place], [place] [address]  Strep pneumoniae urinary antigen     Status: None   Collection Time: 06/22/24  11:39 PM  Result Value Ref Range   Strep Pneumo Urinary Antigen NEGATIVE NEGATIVE    Comment: PERFORMED AT [facility]        Infection due to S. pneumoniae cannot be absolutely ruled out since the antigen present may be below the detection limit of the test.  Performed at [facility], [address], [place], [place] [address]   CBC     Status: None   Collection Time: 06/23/24  5:09 AM  Result Value Ref Range   WBC 8.5 4.0 - 10.5 K/uL   RBC 4.84 4.22 - 5.81 MIL/uL   Hemoglobin 15.7 13.0 - 17.0 g/dL   HCT 54.5 60.9 - 47.9 %   MCV 93.8 80.0 - 100.0 fL   MCH 32.4 26.0 - 34.0 pg   MCHC 34.6 30.0 - 36.0 g/dL   RDW 87.1 88.4 - 84.4 %   Platelets 204 150 - 400 K/uL   nRBC 0.0 0.0 - 0.2 %    Comment: Performed at [facility], [address], [place], [place] [address]  Basic metabolic panel     Status: Abnormal   Collection Time: 06/23/24  5:09 AM  Result Value Ref Range   Sodium 137 135 - 145 mmol/L   Potassium 4.4 3.5 - 5.1 mmol/L   Chloride 99 98 - 111 mmol/L   CO2 25 22 - 32 mmol/L   Glucose, Bld 215 (H) 70 - 99 mg/dL    Comment: Glucose reference range applies only to samples taken after fasting for at least 8 hours.   BUN 23 8 - 23 mg/dL   Creatinine, Ser 9.28 0.61 - 1.24 mg/dL   Calcium  9.6 8.9 - 10.3 mg/dL   GFR, Estimated >39 >39 mL/min    Comment: (NOTE) Calculated using the CKD-EPI Creatinine Equation (2021)    Anion gap 13 5 - 15    Comment: Performed at [facility], [address] [place], [place] [address]  Glucose, capillary     Status: Abnormal   Collection Time: 06/23/24  7:42 AM  Result Value Ref Range   Glucose-Capillary 189 (H) 70 - 99 mg/dL    Comment: Glucose reference range applies only to samples taken after fasting for at least 8 hours.  Glucose, capillary     Status: Abnormal   Collection Time: 06/23/24 12:08 PM  Result Value Ref Range   Glucose-Capillary 215 (H) 70 - 99 mg/dL    Comment:  Glucose reference range applies only to samples taken after fasting for at least 8 hours.  Glucose, capillary     Status: Abnormal   Collection Time: 06/23/24  5:10 PM  Result Value Ref Range   Glucose-Capillary 284 (H) 70 - 99 mg/dL    Comment: Glucose reference range applies only to samples taken after fasting for at least 8 hours.  Glucose, capillary     Status: Abnormal   Collection Time: 06/23/24 10:11 PM  Result Value Ref Range   Glucose-Capillary 285 (H) 70 - 99 mg/dL    Comment: Glucose reference range applies only to samples taken after fasting for at least 8 hours.  Glucose, capillary     Status: Abnormal   Collection Time: 06/24/24  7:50 AM  Result Value Ref Range   Glucose-Capillary 224 (H) 70 - 99 mg/dL    Comment: Glucose reference range applies only to samples taken after fasting for at least 8 hours.  Glucose, capillary     Status: Abnormal   Collection Time: 06/24/24 12:22 PM  Result Value Ref Range   Glucose-Capillary 252 (H) 70 - 99 mg/dL    Comment: Glucose reference range applies only to samples taken after fasting for at least 8 hours.  Glucose, capillary     Status: Abnormal   Collection Time: 06/24/24  4:51  PM  Result Value Ref Range   Glucose-Capillary 325 (H) 70 - 99 mg/dL    Comment: Glucose reference range applies only to samples taken after fasting for at least 8 hours.  Glucose, capillary     Status: Abnormal   Collection Time: 06/24/24 10:18 PM  Result Value Ref Range   Glucose-Capillary 245 (H) 70 - 99 mg/dL    Comment: Glucose reference range applies only to samples taken after fasting for at least 8 hours.  Glucose, capillary     Status: Abnormal   Collection Time: 06/25/24  7:26 AM  Result Value Ref Range   Glucose-Capillary 230 (H) 70 - 99 mg/dL    Comment: Glucose reference range applies only to samples taken after fasting for at least 8 hours.  Glucose, capillary     Status: Abnormal   Collection Time: 06/25/24 11:40 AM  Result Value Ref  Range   Glucose-Capillary 213 (H) 70 - 99 mg/dL    Comment: Glucose reference range applies only to samples taken after fasting for at least 8 hours.  Glucose, capillary     Status: Abnormal   Collection Time: 06/25/24  9:38 PM  Result Value Ref Range   Glucose-Capillary 340 (H) 70 - 99 mg/dL    Comment: Glucose reference range applies only to samples taken after fasting for at least 8 hours.  Glucose, capillary     Status: Abnormal   Collection Time: 06/26/24  7:48 AM  Result Value Ref Range   Glucose-Capillary 161 (H) 70 - 99 mg/dL    Comment: Glucose reference range applies only to samples taken after fasting for at least 8 hours.  Glucose, capillary     Status: Abnormal   Collection Time: 06/26/24  1:23 PM  Result Value Ref Range   Glucose-Capillary 140 (H) 70 - 99 mg/dL    Comment: Glucose reference range applies only to samples taken after fasting for at least 8 hours.        Beverley Adine Hummer, MD, MS  "

## 2024-06-27 NOTE — Progress Notes (Signed)
 " Assessment & Plan   Assessment/Plan:    Assessment & Plan COPD exacerbation with acute exacerbation  Community acquired pneumonia, right upper lobe Recent Diagnosed with community acquired pneumonia in the right upper lobe and COPD exacerbation requiring hospitalization. Currently on oxygen therapy, levofloxacin , and prednisone  taper. Also medications including Breztri , budesonide  nebulizer, and DuoNeb. Smoking cessation achieved, which is beneficial for COPD management. - Continue Breztri  two puffs twice daily - Continue budesonide  nebulizer 0.25 mg twice daily - Continue DuoNeb nebulizer every six hours as needed - Referred to pulmonology for further management - Continue nicotine  patch and avoid smoking - Continue levofloxacin  as prescribed - Continue prednisone  taper as prescribed - Will schedule chest x-ray in four weeks at follow up to assess resolution of pneumonia  Type 2 diabetes mellitus with proteinuria Type 2 diabetes with proteinuria and recent hyperglycemia. Last A1c was 10.5. Currently on metformin , Jardiance , and glipizide . Glucometer malfunctioning. - Sent in a new glucometer/supplies and freestyle libre 3 plus - Referred to endocrinology for diabetes management - Continue metformin , Jardiance , and glipizide  - Recommended low carb diet and exercise as tolerated  Hypertension associated with diabetes Hypertension associated with diabetes. Blood pressure slightly elevated during visit. - Recheck blood pressure after current interventions - Continue losartan  25 mg  Mixed hyperlipidemia Managed with rosuvastatin . - Continue rosuvastatin  5 mg daily  History of myocardial infarction Myocardial infarction with nonadherence to medical treatment. Recommended aspirin  for secondary prevention. - Referred to cardiology for further management - Started aspirin  81 mg daily for secondary prevention  Nonadherence to medical treatment Requires monitoring and support for  adherence. - Referred to pharmacy for observation and support with COPD, hypertension, hyperlipidemia, and diabetes management  Acute respiratory failure and hypoxia  Dependent on Continue supplemental Oxygen Oxygen dependent due to recent exacerbations and pneumonia causing acute respiratory failure and hypoxia requiring hospitalization. Currently on oxygen therapy. Has portible take. 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 Successfully quit smoking and using nicotine  patch. - Continue nicotine  patch - Encouraged continued abstinence from smoking        Medications Discontinued During This Encounter  Medication Reason   Blood Glucose Monitoring Suppl DEVI     Return in about 1 month (around 07/28/2024).        Subjective:   Encounter date: 06/27/2024  Sean Martinez is a 64 y.o. 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   He  has a past medical history of Asthma, Coronary artery disease, MI, old, and Type 2 diabetes mellitus with hyperglycemia, without long-term current use of insulin  (HCC)  (08/10/2022).SABRA   He presents with chief complaint of Follow-up (Patient is following up after an ER visit. Wants to know how long he will be on oxygen.) .   Discussed the use of  AI scribe software for clinical note transcription with the patient, who gave verbal consent to proceed.  History of Present Illness Sean Martinez is a 64 year old male with COPD and diabetes who presents with shortness of breath and recent hospitalization for pneumonia.  Dyspnea and respiratory status - Shortness of breath, currently mild - Recent hospitalization from January 2-6, 2026, for COPD exacerbation and right upper lobe community-acquired pneumonia at [facility] - Treated with IV antibiotics with transition to oral prednisone  taper and levofloxacin  after discharge  - Using Breztri , budesonide  nebulizers, and DuoNeb for respiratory management - States he is compliant - States he feels better - Plans to use home oxygen machine upon return home  Fever - Mild fever last night, now resolved  Glycemic control - Diabetes with recent A1c of 10.5 - On metformin  750 mg once daily, Jardiance  10 mg, and glipizide  5 mg - Elevated blood glucose levels, attributed to recent steroid use - Glucometer not functioning properly  Tobacco use and smoking cessation - History of smoking, quit last week - Currently using nicotine  patch for smoking cessation  Functional status and social context - Desires to return to work by Monday, feels able to do so - Lives 10-15 minutes from clinic     ROS  Past Surgical History:  Procedure Laterality Date   HERNIA REPAIR     LEFT HEART CATHETERIZATION WITH CORONARY ANGIOGRAM N/A 07/17/2012   Procedure: LEFT HEART CATHETERIZATION WITH CORONARY ANGIOGRAM;  Surgeon: Erick JONELLE Bergamo, MD;  Location: [facility] CATH LAB;  Service: Cardiovascular;  Laterality: N/A;   VENTRAL HERNIA REPAIR N/A 07/09/2023   Procedure: OPEN RIGHT INGUINAL HERNIA REPAIR; DIAGNOSTIC  LAPAROSCOPY;  Surgeon: Polly Cordella LABOR, MD;  Location: [facility] ORS;  Service: General;  Laterality: N/A;    Outpatient Medications Prior to Visit  Medication Sig Dispense Refill   acetaminophen  (TYLENOL ) 325 MG tablet Take 2 tablets (650 mg total) by mouth every 6 (six) hours as needed for mild pain (pain score 1-3) or fever.     albuterol  (VENTOLIN  HFA) 108 (90 Base) MCG/ACT inhaler Inhale 1-2 puffs into the lungs every 6 (six) hours as needed for wheezing or shortness of breath. 18 g 11   arformoterol  (BROVANA ) 15 MCG/2ML NEBU Inhale 2 mLs (15 mcg total) by nebulization 2 (two) times daily. 120 mL 0   budesonide  (PULMICORT ) 0.25 MG/2ML nebulizer solution Inhale 2 mLs (0.25 mg total) by nebulization 2 (two) times daily. 60 mL 12   budesonide -glycopyrrolate-formoterol (BREZTRI  AEROSPHERE) 160-9-4.8 MCG/ACT AERO inhaler Inhale 2 puffs into the lungs 2 (two) times daily. 10.7 g 11   Dextromethorphan -guaiFENesin  (ROBAFEN DM) 20-200 MG/20ML LIQD Take 5 mLs by mouth every 4 (four) hours as needed (chest congestion). 118 mL 0   empagliflozin  (JARDIANCE ) 10 MG TABS tablet Take 1 tablet (10 mg total) by mouth daily. 30 tablet 1   glipiZIDE  (GLUCOTROL  XL) 5 MG 24 hr tablet Please take this if your fasting AM blood glucose is > 150 20 tablet 0   guaiFENesin  (MUCINEX ) 600 MG 12 hr tablet Take 1 tablet (600 mg total) by mouth 2 (two) times daily. 60 tablet 0   ibuprofen  (ADVIL ) 400 MG tablet Take 1 tablet (400 mg total) by mouth every 4 (four) hours as needed for fever or headache. 30 tablet 0   ipratropium-albuterol  (DUONEB) 0.5-2.5 (3) MG/3ML SOLN Inhale 3 mLs by nebulization every 6 (six) hours as needed. 360 mL 0   levofloxacin  (LEVAQUIN ) 750 MG tablet Take 1 tablet (  750 mg total) by mouth daily. 7 tablet 0   losartan  (COZAAR ) 25 MG tablet Take 0.5 tablets (12.5 mg total) by mouth daily. 45 tablet 3   metFORMIN  (GLUCOPHAGE -XR) 750 MG 24 hr tablet Take 750 mg by mouth daily with breakfast.     nicotine   (NICODERM CQ  - DOSED IN MG/24 HOURS) 21 mg/24hr patch Place 1 patch (21 mg total) onto the skin daily. 28 patch 0   OXYGEN Inhale 2 L into the lungs continuous.     predniSONE  (DELTASONE ) 10 MG tablet Take 6 tablets by mouth daily with breakfast for 3 days, THEN take 4 tablets daily for 3 days, THEN take 2 tablets daily for 3 days, THEN take 1 tablet daily for 3 days, THEN take  0.5 tablets daily for 3 days. 41 tablet 0   rosuvastatin  (CRESTOR ) 5 MG tablet Take 1 tablet by mouth once daily 90 tablet 0   senna-docusate (SENOKOT-S) 8.6-50 MG tablet Take 1 tablet by mouth at bedtime as needed for mild constipation.     tamsulosin  (FLOMAX ) 0.4 MG CAPS capsule Take 1 capsule (0.4 mg total) by mouth daily. 30 capsule 3   Colchicine  0.6 MG CAPS At onset of flair, Take 1.2 mg (two tablets). Take 0.6 mg (one tablet) one hour later. Then, take 0.6 mg (one tablet) once daily for up to 7 days as needed for swelling and pain. (Patient not taking: Reported on 06/23/2024) 30 capsule 0   Doxylamine Succinate, Sleep, (SLEEP AID PO) Take 1 tablet by mouth at bedtime as needed. (Patient not taking: Reported on 06/27/2024)     Blood Glucose Monitoring Suppl DEVI 1 each by Does not apply route in the morning, at noon, and at bedtime. May substitute to any manufacturer covered by patient's insurance. (Patient not taking: Reported on 06/27/2024) 1 each 0   No facility-administered medications prior to visit.    Family History  Problem Relation Age of Onset   Diabetes Mellitus II Mother     Social History   Socioeconomic History   Marital status: Single    Spouse name: Not on file   Number of children: Not on file   Years of education: Not on file   Highest education level: Not on file  Occupational History   Not on file  Tobacco Use   Smoking status: Former    Current packs/day: 0.00    Average packs/day: 0.3 packs/day for 48.0 years (12.0 ttl pk-yrs)    Types: Cigarettes    Start date: 67    Quit date:  06/20/2024    Years since quitting: 0.0    Passive exposure: Never   Smokeless tobacco: Never  Vaping Use   Vaping status: Never Used  Substance and Sexual Activity   Alcohol use: Not Currently   Drug use: Not Currently   Sexual activity: Not on file  Other Topics Concern   Not on file  Social History Narrative   Not on file   Social Drivers of Health   Tobacco Use: Medium Risk (06/27/2024)   Patient History    Smoking Tobacco Use: Former    Smokeless Tobacco Use: Never    Passive Exposure: Never  Physicist, Medical Strain: Not on file  Food Insecurity: No Food Insecurity (06/22/2024)   Epic    Worried About Programme Researcher, Broadcasting/film/video in the Last Year: Never true    Ran Out of Food in the Last Year: Never true  Transportation Needs: No Transportation Needs (06/22/2024)   Epic  Lack of Transportation (Medical): No    Lack of Transportation (Non-Medical): No  Physical Activity: Not on file  Stress: Not on file  Social Connections: Not on file  Intimate Partner Violence: Not At Risk (06/22/2024)   Epic    Fear of Current or Ex-Partner: No    Emotionally Abused: No    Physically Abused: No    Sexually Abused: No  Depression (PHQ2-9): Low Risk (06/27/2024)   Depression (PHQ2-9)    PHQ-2 Score: 0  Alcohol Screen: Not on file  Housing: Low Risk (06/22/2024)   Epic    Unable to Pay for Housing in the Last Year: No    Number of Times Moved in the Last Year: 0    Homeless in the Last Year: No  Utilities: Not At Risk (06/22/2024)   Epic    Threatened with loss of utilities: No  Health Literacy: Not on file                                                                                                  Objective:  Physical Exam: BP (!) 143/91 (BP Location: Left Arm, Patient Position: Sitting, Cuff Size: Normal)   Pulse 99   Temp 98.1 F (36.7 C)   Ht 6' (1.829 m)   Wt 171 lb (77.6 kg)   SpO2 91%   BMI 23.19 kg/m    Physical Exam VITALS: SaO2- 87% Ambulating on RA GENERAL:  Alert, cooperative, well developed, no acute distress HEENT: Normocephalic, normal oropharynx, moist mucous membranes CHEST: Poor air movement throughout lung fields CARDIOVASCULAR: Normal heart rate and rhythm, S1 and S2 normal without murmurs ABDOMEN: Soft, non-tender, non-distended, without organomegaly, normal bowel sounds EXTREMITIES: No cyanosis or edema NEUROLOGICAL: Cranial nerves grossly intact, moves all extremities without gross motor or sensory deficit   Physical Exam  CT CHEST WO CONTRAST Result Date: 06/22/2024 EXAM: CT CHEST WITHOUT CONTRAST 06/22/2024 04:50:47 PM TECHNIQUE: CT of the chest was performed without the administration of intravenous contrast. Multiplanar reformatted images are provided for review. Automated exposure control, iterative reconstruction, and/or weight based adjustment of the mA/kV was utilized to reduce the radiation dose to as low as reasonably achievable. COMPARISON: None available. CLINICAL HISTORY: Respiratory illness, nondiagnostic xray. FINDINGS: MEDIASTINUM: Heart and pericardium are unremarkable. The central airways are clear. The main pulmonary artery is normal in caliber. The thoracic aorta is normal in caliber. LYMPH NODES: Prominent but not enlarged mediastinal lymph nodes. No gross hilar adenopathy with limited evaluation on this noncontrast study. No axillary lymphadenopathy. LUNGS AND PLEURA: Consolidative right perihilar upper lobe peribronchovascular consolidation. Patchy consolidative airspace opacities throughout the remainder of the upper lobe as well as right middle lobe to a lesser extent. Centrilobular emphysematous changes. Possible area of cavitation within the right upper lobe measuring 1.3 x 1.1 cm (series 3, image 88). No pleural effusion or pneumothorax. SOFT TISSUES/BONES: No acute abnormality of the bones or soft tissues. UPPER ABDOMEN: Limited images of the upper abdomen demonstrates colonic diverticulosis. IMPRESSION: 1. Right  perihilar upper lobe peribronchovascular consolidation with possible rul cavitation measuring 1.3 x 1.1 cm. Additional patchy  consolidative airspace opacities in the right upper lobe and, to a lesser extent, the right middle lobe, most consistent with pneumonia. 2. Prominent but not enlarged mediastinal lymph nodes without gross hilar adenopathy - limited evaluation on this noncontrast study. 3. Emphysema. 4. Recommend follow-up chest imaging to document resolution. Electronically signed by: Morgane Naveau MD 06/22/2024 05:43 PM EST RP Workstation: [id number]   DG Chest 2 View Result Date: 06/22/2024 EXAM: 2 VIEW(S) XRAY OF THE CHEST 06/22/2024 03:20:06 PM COMPARISON: 06/15/2024 CLINICAL HISTORY: sob FINDINGS: LUNGS AND PLEURA: Ill-defined opacities in right suprahilar region. No pleural effusion. No pneumothorax. HEART AND MEDIASTINUM: No acute abnormality of the cardiac and mediastinal silhouettes. BONES AND SOFT TISSUES: No acute osseous abnormality. IMPRESSION: 1. Ill-defined opacities in the right suprahilar region. Electronically signed by: Morgane Naveau MD 06/22/2024 04:15 PM EST RP Workstation: [id number]   DG Chest 2 View Result Date: 06/15/2024 CLINICAL DATA:  Cough for 2 days EXAM: CHEST - 2 VIEW COMPARISON:  December 14, 2022 FINDINGS: The heart size and mediastinal contours are within normal limits. Both lungs are clear. The visualized skeletal structures are unremarkable. IMPRESSION: No active cardiopulmonary disease. Electronically Signed   By: Lynwood Landy Raddle M.D.   On: 06/15/2024 15:13    Recent Results (from the past 2160 hours)  POC urinalysis dipstick     Status: Abnormal   Collection Time: 04/19/24 12:31 PM  Result Value Ref Range   Color, UA yellow yellow   Clarity, UA turbid (A) clear   Glucose, UA >=1,000 (A) negative mg/dL   Bilirubin, UA negative negative   Ketones, POC UA negative negative mg/dL   Spec Grav, UA >=8.969 (A) 1.010 - 1.025   Blood, UA negative negative   pH,  UA 5.5 5.0 - 8.0   Protein Ur, POC =30 (A) negative mg/dL   Urobilinogen, UA 0.2 0.2 or 1.0 E.U./dL   Nitrite, UA Negative Negative   Leukocytes, UA Negative Negative  Urine Culture     Status: Abnormal   Collection Time: 04/19/24 12:41 PM   Specimen: Urine, Clean Catch  Result Value Ref Range   Specimen Description URINE, CLEAN CATCH    Special Requests      NONE Performed at [facility], [address], [place], [place] [address]    Culture MULTIPLE SPECIES PRESENT, SUGGEST RECOLLECTION (A)    Report Status 04/20/2024 FINAL   POC CBG monitoring     Status: Abnormal   Collection Time: 04/19/24 12:47 PM  Result Value Ref Range   POCT Glucose ([facility]) 261 (A) 70 - 99 mg/dL  CBC with Differential     Status: None   Collection Time: 04/19/24 12:49 PM  Result Value Ref Range   WBC 4.0 4.0 - 10.5 K/uL   RBC 5.14 4.22 - 5.81 MIL/uL   Hemoglobin 16.7 13.0 - 17.0 g/dL   HCT 51.1 60.9 - 47.9 %   MCV 94.9 80.0 - 100.0 fL   MCH 32.5 26.0 - 34.0 pg   MCHC 34.2 30.0 - 36.0 g/dL   RDW 86.2 88.4 - 84.4 %   Platelets 153 150 - 400 K/uL   nRBC 0.0 0.0 - 0.2 %   Neutrophils Relative % 52 %   Neutro Abs 2.1 1.7 - 7.7 K/uL   Lymphocytes Relative 37 %   Lymphs Abs 1.5 0.7 - 4.0 K/uL   Monocytes Relative 9 %   Monocytes Absolute 0.4 0.1 - 1.0 K/uL   Eosinophils Relative 1 %   Eosinophils Absolute  0.0 0.0 - 0.5 K/uL   Basophils Relative 1 %   Basophils Absolute 0.0 0.0 - 0.1 K/uL   Immature Granulocytes 0 %   Abs Immature Granulocytes 0.00 0.00 - 0.07 K/uL    Comment: Performed at [facility], [address] [place], [place] [address]  Comprehensive metabolic panel     Status: Abnormal   Collection Time: 04/19/24 12:49 PM  Result Value Ref Range   Sodium 135 135 - 145 mmol/L   Potassium 4.8 3.5 - 5.1 mmol/L    Comment: HEMOLYSIS AT THIS LEVEL MAY AFFECT RESULT   Chloride 98 98 - 111 mmol/L   CO2 26 22 - 32 mmol/L   Glucose, Bld 224 (H) 70 - 99 mg/dL    Comment: Glucose  reference range applies only to samples taken after fasting for at least 8 hours.   BUN 17 8 - 23 mg/dL   Creatinine, Ser 9.20 0.61 - 1.24 mg/dL   Calcium  9.2 8.9 - 10.3 mg/dL   Total Protein 6.6 6.5 - 8.1 g/dL   Albumin  3.7 3.5 - 5.0 g/dL   AST 27 15 - 41 U/L    Comment: HEMOLYSIS AT THIS LEVEL MAY AFFECT RESULT   ALT 26 0 - 44 U/L    Comment: HEMOLYSIS AT THIS LEVEL MAY AFFECT RESULT   Alkaline Phosphatase 67 38 - 126 U/L   Total Bilirubin 1.2 0.0 - 1.2 mg/dL    Comment: HEMOLYSIS AT THIS LEVEL MAY AFFECT RESULT   GFR, Estimated >60 >60 mL/min    Comment: (NOTE) Calculated using the CKD-EPI Creatinine Equation (2021)    Anion gap 11 5 - 15    Comment: Performed at [facility], [address] [place], [place] [address]  POCT glycosylated hemoglobin (Hb A1C)     Status: Abnormal   Collection Time: 04/30/24  4:36 PM  Result Value Ref Range   Hemoglobin A1C 10.5 (A) 4.0 - 5.6 %   HbA1c POC (<> result, manual entry) 10.5 4.0 - 5.6 %   HbA1c, POC (prediabetic range) 10.5 (A) 5.7 - 6.4 %   HbA1c, POC (controlled diabetic range) 10.5 (A) 0.0 - 7.0 %  POCT urinalysis dipstick     Status: Abnormal   Collection Time: 04/30/24  4:37 PM  Result Value Ref Range   Color, UA     Clarity, UA     Glucose, UA Positive (A) Negative    Comment: 1000mg /dL   Bilirubin, UA Negative    Ketones, UA Negative    Spec Grav, UA 1.015 1.010 - 1.025   Blood, UA Negative    pH, UA 6.0 5.0 - 8.0   Protein, UA Positive (A) Negative    Comment: 15mg    Urobilinogen, UA 0.2 0.2 or 1.0 E.U./dL   Nitrite, UA Negative    Leukocytes, UA Negative Negative   Appearance     Odor    POC Covid19/Flu A&B Antigen     Status: None   Collection Time: 06/15/24  2:27 PM  Result Value Ref Range   Influenza A Antigen, POC Negative Negative   Influenza B Antigen, POC Negative Negative   Covid Antigen, POC Negative Negative  POCT glucose (manual entry)     Status: Abnormal   Collection Time: 06/15/24  3:09 PM   Result Value Ref Range   POCT Glucose ([facility]) 115 (A) 70 - 99 mg/dL  Basic metabolic panel     Status: Abnormal   Collection Time: 06/22/24  3:13  PM  Result Value Ref Range   Sodium 139 135 - 145 mmol/L   Potassium 3.9 3.5 - 5.1 mmol/L   Chloride 101 98 - 111 mmol/L   CO2 23 22 - 32 mmol/L   Glucose, Bld 148 (H) 70 - 99 mg/dL    Comment: Glucose reference range applies only to samples taken after fasting for at least 8 hours.   BUN 16 8 - 23 mg/dL   Creatinine, Ser 9.26 0.61 - 1.24 mg/dL   Calcium  9.6 8.9 - 10.3 mg/dL   GFR, Estimated >39 >39 mL/min    Comment: (NOTE) Calculated using the CKD-EPI Creatinine Equation (2021)    Anion gap 14 5 - 15    Comment: Performed at [facility], [address] [place], [place] [address]  CBC     Status: Abnormal   Collection Time: 06/22/24  3:13 PM  Result Value Ref Range   WBC 13.5 (H) 4.0 - 10.5 K/uL   RBC 5.14 4.22 - 5.81 MIL/uL   Hemoglobin 16.7 13.0 - 17.0 g/dL   HCT 51.6 60.9 - 47.9 %   MCV 94.0 80.0 - 100.0 fL   MCH 32.5 26.0 - 34.0 pg   MCHC 34.6 30.0 - 36.0 g/dL   RDW 87.0 88.4 - 84.4 %   Platelets 199 150 - 400 K/uL   nRBC 0.0 0.0 - 0.2 %    Comment: Performed at [facility], [address], [place], [place] [address]  Resp panel by RT-PCR (RSV, Flu A&B, Covid) Anterior Nasal Swab     Status: None   Collection Time: 06/22/24  3:27 PM   Specimen: Anterior Nasal Swab  Result Value Ref Range   SARS Coronavirus 2 by RT PCR NEGATIVE NEGATIVE    Comment: (NOTE) SARS-CoV-2 target nucleic acids are NOT DETECTED.  The SARS-CoV-2 RNA is generally detectable in upper respiratory specimens during the acute phase of infection. The lowest concentration of SARS-CoV-2 viral copies this assay can detect is 138 copies/mL. A negative result does not preclude SARS-Cov-2 infection and should not be used as the sole basis for treatment or other patient management decisions. A negative result may occur  with  improper specimen collection/handling, submission of specimen other than nasopharyngeal swab, presence of viral mutation(s) within the areas targeted by this assay, and inadequate number of viral copies(<138 copies/mL). A negative result must be combined with clinical observations, patient history, and epidemiological information. The expected result is Negative.  Fact Sheet for Patients:  bloggercourse.com  Fact Sheet for Healthcare Providers:  seriousbroker.it  This test is no t yet approved or cleared by the [place]  FDA and  has been authorized for detection and/or diagnosis of SARS-CoV-2 by FDA under an Emergency Use Authorization (EUA). This EUA will remain  in effect (meaning this test can be used) for the duration of the COVID-19 declaration under Section 564(b)(1) of the Act, 21 U.S.C.section 360bbb-3(b)(1), unless the authorization is terminated  or revoked sooner.       Influenza A by PCR NEGATIVE NEGATIVE   Influenza B by PCR NEGATIVE NEGATIVE    Comment: (NOTE) The Xpert Xpress SARS-CoV-2/FLU/RSV plus assay is intended as an aid in the diagnosis of influenza from Nasopharyngeal swab specimens and should not be used as a sole basis for treatment. Nasal washings and aspirates are unacceptable for Xpert Xpress SARS-CoV-2/FLU/RSV testing.  Fact Sheet for Patients: bloggercourse.com  Fact Sheet for Healthcare Providers: seriousbroker.it  This test is not yet approved or  cleared by the [place]  FDA and has been authorized for detection and/or diagnosis of SARS-CoV-2 by FDA under an Emergency Use Authorization (EUA). This EUA will remain in effect (meaning this test can be used) for the duration of the COVID-19 declaration under Section 564(b)(1) of the Act, 21 U.S.C. section 360bbb-3(b)(1), unless the authorization is terminated or revoked.     Resp  Syncytial Virus by PCR NEGATIVE NEGATIVE    Comment: (NOTE) Fact Sheet for Patients: bloggercourse.com  Fact Sheet for Healthcare Providers: seriousbroker.it  This test is not yet approved or cleared by the [place]  FDA and has been authorized for detection and/or diagnosis of SARS-CoV-2 by FDA under an Emergency Use Authorization (EUA). This EUA will remain in effect (meaning this test can be used) for the duration of the COVID-19 declaration under Section 564(b)(1) of the Act, 21 U.S.C. section 360bbb-3(b)(1), unless the authorization is terminated or revoked.  Performed at [facility], [address] [place], [place] [address]   Culture, blood (routine x 2)     Status: None   Collection Time: 06/22/24  8:08 PM   Specimen: BLOOD RIGHT ARM  Result Value Ref Range   Specimen Description      BLOOD RIGHT ARM Performed at Medical [facility], [address] [place], [place] [address]    Special Requests      BOTTLES DRAWN AEROBIC ONLY Blood Culture adequate volume Performed at [facility], [address] [place], [place] [address]    Culture      NO GROWTH 5 DAYS Performed at [facility], [address], [place], [place] [address]    Report Status 06/27/2024 FINAL   Culture, blood (routine x 2)     Status: None   Collection Time: 06/22/24  8:08 PM   Specimen: BLOOD RIGHT ARM  Result Value Ref Range   Specimen Description      BLOOD RIGHT ARM Performed at [facility], [address] [place], [place] [address]    Special Requests      BOTTLES DRAWN AEROBIC ONLY Blood Culture adequate volume Performed at [facility], [address], [place], [place] [address]    Culture      NO GROWTH 5 DAYS Performed at [facility], [address], [place], [place] [address]    Report Status 06/27/2024 FINAL   Glucose, capillary     Status: Abnormal    Collection Time: 06/22/24  8:42 PM  Result Value Ref Range   Glucose-Capillary 271 (H) 70 - 99 mg/dL    Comment: Glucose reference range applies only to samples taken after fasting for at least 8 hours.  Expectorated Sputum Assessment w Gram Stain, Rflx to Resp Cult     Status: None   Collection Time: 06/22/24  9:06 PM   Specimen: Sputum  Result Value Ref Range   Specimen Description SPUTUM    Special Requests NONE    Sputum evaluation      THIS SPECIMEN IS ACCEPTABLE FOR SPUTUM CULTURE Performed at [facility], [address], [place], [place] [address]    Report Status 06/22/2024 FINAL   Culture, Respiratory w Gram Stain     Status: None   Collection Time: 06/22/24  9:06 PM   Specimen: SPU  Result Value Ref Range   Specimen Description      SPUTUM Performed at [facility], [address], [place], [place] [address]  Special Requests      NONE Reflexed from [phone] Performed at [facility], [address] [place], [place] [address]    Gram Stain      MODERATE WBC PRESENT, PREDOMINANTLY PMN MODERATE GRAM POSITIVE COCCI FEW YEAST RARE GRAM POSITIVE RODS Performed at [facility], [address] [place], [place] [address]    Culture FEW PSEUDOMONAS AERUGINOSA    Report Status 06/26/2024 FINAL    Organism ID, Bacteria PSEUDOMONAS AERUGINOSA       Susceptibility   Pseudomonas aeruginosa - MIC*    MEROPENEM <=0.25 SENSITIVE Sensitive     CIPROFLOXACIN 0.12 SENSITIVE Sensitive     IMIPENEM 2 SENSITIVE Sensitive     PIP/TAZO Value in next row Sensitive      8 SENSITIVEThis is a modified FDA-approved test that has been validated and its performance characteristics determined by the reporting laboratory.  This laboratory is certified under the Clinical Laboratory Improvement Amendments CLIA as qualified to perform high complexity clinical laboratory testing.    CEFEPIME Value in next row Sensitive      8  SENSITIVEThis is a modified FDA-approved test that has been validated and its performance characteristics determined by the reporting laboratory.  This laboratory is certified under the Clinical Laboratory Improvement Amendments CLIA as qualified to perform high complexity clinical laboratory testing.    CEFTAZIDIME/AVIBACTAM Value in next row Sensitive      8 SENSITIVEThis is a modified FDA-approved test that has been validated and its performance characteristics determined by the reporting laboratory.  This laboratory is certified under the Clinical Laboratory Improvement Amendments CLIA as qualified to perform high complexity clinical laboratory testing.    CEFTOLOZANE/TAZOBACTAM Value in next row Sensitive      8 SENSITIVEThis is a modified FDA-approved test that has been validated and its performance characteristics determined by the reporting laboratory.  This laboratory is certified under the Clinical Laboratory Improvement Amendments CLIA as qualified to perform high complexity clinical laboratory testing.    TOBRAMYCIN Value in next row Sensitive      8 SENSITIVEThis is a modified FDA-approved test that has been validated and its performance characteristics determined by the reporting laboratory.  This laboratory is certified under the Clinical Laboratory Improvement Amendments CLIA as qualified to perform high complexity clinical laboratory testing.    CEFTAZIDIME Value in next row Sensitive      8 SENSITIVEThis is a modified FDA-approved test that has been validated and its performance characteristics determined by the reporting laboratory.  This laboratory is certified under the Clinical Laboratory Improvement Amendments CLIA as qualified to perform high complexity clinical laboratory testing.    * FEW PSEUDOMONAS AERUGINOSA  Legionella Pneumophila Serogp 1 Ur Ag     Status: None   Collection Time: 06/22/24 11:39 PM  Result Value Ref Range   L. pneumophila Serogp 1 Ur Ag Negative Negative     Comment: (NOTE) Presumptive negative for L. pneumophila serogroup 1 antigen in urine, suggesting no recent or current infection. Legionnaires' disease cannot be ruled out since other serogroups and species may also cause disease. Performed At: [facility] [address] [place], [place] [id number] Jennette Shorter MD Ey:[id number]    Source of Sample URINE, RANDOM     Comment: Performed at [facility], [address] [place], [place] [address]  Strep pneumoniae urinary antigen     Status: None   Collection Time: 06/22/24 11:39 PM  Result Value Ref Range   Strep Pneumo Urinary  Antigen NEGATIVE NEGATIVE    Comment: PERFORMED AT [facility]        Infection due to S. pneumoniae cannot be absolutely ruled out since the antigen present may be below the detection limit of the test. Performed at [facility], [address], [place], [place] [address]   CBC     Status: None   Collection Time: 06/23/24  5:09 AM  Result Value Ref Range   WBC 8.5 4.0 - 10.5 K/uL   RBC 4.84 4.22 - 5.81 MIL/uL   Hemoglobin 15.7 13.0 - 17.0 g/dL   HCT 54.5 60.9 - 47.9 %   MCV 93.8 80.0 - 100.0 fL   MCH 32.4 26.0 - 34.0 pg   MCHC 34.6 30.0 - 36.0 g/dL   RDW 87.1 88.4 - 84.4 %   Platelets 204 150 - 400 K/uL   nRBC 0.0 0.0 - 0.2 %    Comment: Performed at [facility], [address] [place], [place] [address]  Basic metabolic panel     Status: Abnormal   Collection Time: 06/23/24  5:09 AM  Result Value Ref Range   Sodium 137 135 - 145 mmol/L   Potassium 4.4 3.5 - 5.1 mmol/L   Chloride 99 98 - 111 mmol/L   CO2 25 22 - 32 mmol/L   Glucose, Bld 215 (H) 70 - 99 mg/dL    Comment: Glucose reference range applies only to samples taken after fasting for at least 8 hours.   BUN 23 8 - 23 mg/dL   Creatinine, Ser 9.28 0.61 - 1.24 mg/dL   Calcium  9.6 8.9 - 10.3 mg/dL   GFR, Estimated >39 >39 mL/min    Comment: (NOTE) Calculated using the CKD-EPI  Creatinine Equation (2021)    Anion gap 13 5 - 15    Comment: Performed at [facility], [address] [place], [place] [address]  Glucose, capillary     Status: Abnormal   Collection Time: 06/23/24  7:42 AM  Result Value Ref Range   Glucose-Capillary 189 (H) 70 - 99 mg/dL    Comment: Glucose reference range applies only to samples taken after fasting for at least 8 hours.  Glucose, capillary     Status: Abnormal   Collection Time: 06/23/24 12:08 PM  Result Value Ref Range   Glucose-Capillary 215 (H) 70 - 99 mg/dL    Comment: Glucose reference range applies only to samples taken after fasting for at least 8 hours.  Glucose, capillary     Status: Abnormal   Collection Time: 06/23/24  5:10 PM  Result Value Ref Range   Glucose-Capillary 284 (H) 70 - 99 mg/dL    Comment: Glucose reference range applies only to samples taken after fasting for at least 8 hours.  Glucose, capillary     Status: Abnormal   Collection Time: 06/23/24 10:11 PM  Result Value Ref Range   Glucose-Capillary 285 (H) 70 - 99 mg/dL    Comment: Glucose reference range applies only to samples taken after fasting for at least 8 hours.  Glucose, capillary     Status: Abnormal   Collection Time: 06/24/24  7:50 AM  Result Value Ref Range   Glucose-Capillary 224 (H) 70 - 99 mg/dL    Comment: Glucose reference range applies only to samples taken after fasting for at least 8 hours.  Glucose, capillary     Status: Abnormal   Collection Time: 06/24/24 12:22 PM  Result Value Ref Range  Glucose-Capillary 252 (H) 70 - 99 mg/dL    Comment: Glucose reference range applies only to samples taken after fasting for at least 8 hours.  Glucose, capillary     Status: Abnormal   Collection Time: 06/24/24  4:51 PM  Result Value Ref Range   Glucose-Capillary 325 (H) 70 - 99 mg/dL    Comment: Glucose reference range applies only to samples taken after fasting for at least 8 hours.  Glucose, capillary     Status:  Abnormal   Collection Time: 06/24/24 10:18 PM  Result Value Ref Range   Glucose-Capillary 245 (H) 70 - 99 mg/dL    Comment: Glucose reference range applies only to samples taken after fasting for at least 8 hours.  Glucose, capillary     Status: Abnormal   Collection Time: 06/25/24  7:26 AM  Result Value Ref Range   Glucose-Capillary 230 (H) 70 - 99 mg/dL    Comment: Glucose reference range applies only to samples taken after fasting for at least 8 hours.  Glucose, capillary     Status: Abnormal   Collection Time: 06/25/24 11:40 AM  Result Value Ref Range   Glucose-Capillary 213 (H) 70 - 99 mg/dL    Comment: Glucose reference range applies only to samples taken after fasting for at least 8 hours.  Glucose, capillary     Status: Abnormal   Collection Time: 06/25/24  9:38 PM  Result Value Ref Range   Glucose-Capillary 340 (H) 70 - 99 mg/dL    Comment: Glucose reference range applies only to samples taken after fasting for at least 8 hours.  Glucose, capillary     Status: Abnormal   Collection Time: 06/26/24  7:48 AM  Result Value Ref Range   Glucose-Capillary 161 (H) 70 - 99 mg/dL    Comment: Glucose reference range applies only to samples taken after fasting for at least 8 hours.  Glucose, capillary     Status: Abnormal   Collection Time: 06/26/24  1:23 PM  Result Value Ref Range   Glucose-Capillary 140 (H) 70 - 99 mg/dL    Comment: Glucose reference range applies only to samples taken after fasting for at least 8 hours.        Beverley Adine Hummer, MD, MS  "

## 2024-06-27 NOTE — Transitions of Care (Post Inpatient/ED Visit) (Signed)
 "  06/27/2024  Name: Sean Martinez MRN: [id number] DOB: 14-Oct-1960  Today's TOC FU Call Status: Today's TOC FU Call Status:: Successful TOC FU Call Completed TOC FU Call Complete Date: 06/27/24  Patient's Name and Date of Birth confirmed. Name, DOB  Transition Care Management Follow-up Telephone Call Date of Discharge: 06/26/24 Discharge Facility: [facility]) Type of Discharge: Inpatient Admission Primary Inpatient Discharge Diagnosis:: Right upper Lobe Pneumonia; COPD exacerbation How have you been since you were released from the hospital?: Better Any questions or concerns?: No  Items Reviewed: Did you receive and understand the discharge instructions provided?: Yes Medications obtained,verified, and reconciled?: Yes (Medications Reviewed) Any new allergies since your discharge?: No Dietary orders reviewed?: NA Do you have support at home?: Yes People in Home [RPT]: significant other Name of Support/Comfort Primary Source: Did not name  Medications Reviewed Today: Medications Reviewed Today     Reviewed by Eilleen Richerd GRADE, RN (Registered Nurse) on 06/27/24 at 1557  Med List Status: <None>   Medication Order Taking? Sig Documenting Provider Last Dose Status Informant  acetaminophen  (TYLENOL ) 325 MG tablet [id number] Yes Take 2 tablets (650 mg total) by mouth every 6 (six) hours as needed for mild pain (pain score 1-3) or fever. Vicci Burnard SAUNDERS, PA-C  Active Self  albuterol  (VENTOLIN  HFA) 108 (90 Base) MCG/ACT inhaler [id number] Yes Inhale 1-2 puffs into the lungs every 6 (six) hours as needed for wheezing or shortness of breath. Sebastian Beverley NOVAK, MD  Active Self  arformoterol  (BROVANA ) 15 MCG/2ML NEBU [id number] Yes Inhale 2 mLs (15 mcg total) by nebulization 2 (two) times daily. Rizwan, Saima, MD  Active   aspirin  EC 81 MG tablet [id number] Yes Take 1 tablet (81 mg total) by mouth daily. Swallow whole. Sebastian Beverley NOVAK, MD  Active   Blood Glucose Monitoring Suppl  DEVI [id number]  1 each by Does not apply route as directed. Dispense based on patient and insurance preference. Use up to four times daily as directed. (FOR ICD-10 E10.9, E11.9). Sebastian Beverley NOVAK, MD  Active   budesonide  (PULMICORT ) 0.25 MG/2ML nebulizer solution [id number] Yes Inhale 2 mLs (0.25 mg total) by nebulization 2 (two) times daily. Rizwan, Saima, MD  Active   budesonide -glycopyrrolate-formoterol (BREZTRI  AEROSPHERE) 160-9-4.8 MCG/ACT AERO inhaler [id number] Yes Inhale 2 puffs into the lungs 2 (two) times daily. Billy Knee, FNP  Active Self  Colchicine  0.6 MG CAPS [id number]  At onset of flair, Take 1.2 mg (two tablets). Take 0.6 mg (one tablet) one hour later. Then, take 0.6 mg (one tablet) once daily for up to 7 days as needed for swelling and pain.  Patient not taking: Reported on 06/23/2024   Sebastian Beverley NOVAK, MD  Active Self  Continuous Glucose Sensor (FREESTYLE LIBRE 3 PLUS SENSOR) [place] [id number]  Change sensor every 15 days. Sebastian Beverley NOVAK, MD  Active   Dextromethorphan -guaiFENesin  (ROBAFEN DM) 20-200 MG/20ML LIQD [id number] Yes Take 5 mLs by mouth every 4 (four) hours as needed (chest congestion). Rizwan, Saima, MD  Active   Doxylamine Succinate, Sleep, (SLEEP AID PO) [id number]  Take 1 tablet by mouth at bedtime as needed.  Patient not taking: Reported on 06/27/2024   [provider]  Active Self  empagliflozin  (JARDIANCE ) 10 MG TABS tablet [id number] Yes Take 1 tablet (10 mg total) by mouth daily. McElwee, Lauren A, NP  Active Self  glipiZIDE  (GLUCOTROL  XL) 5 MG 24 hr tablet [id number] Yes Please take this if your fasting AM blood glucose is > 150 Rizwan, Saima,  MD  Active   Glucose Blood (BLOOD GLUCOSE TEST STRIPS) STRP [id number]  1 each by Does not apply route as directed. Dispense based on patient and insurance preference. Use up to four times daily as directed. (FOR ICD-10 E10.9, E11.9). Sebastian Beverley NOVAK, MD  Active   guaiFENesin  (MUCINEX ) 600 MG 12 hr tablet [id number]  Yes Take 1 tablet (600 mg total) by mouth 2 (two) times daily. Rizwan, Saima, MD  Active   ibuprofen  (ADVIL ) 400 MG tablet [id number] Yes Take 1 tablet (400 mg total) by mouth every 4 (four) hours as needed for fever or headache. Earley Saucer, MD  Active   ipratropium-albuterol  (DUONEB) 0.5-2.5 (3) MG/3ML nebulizer solution 3 mL [id number]   Sebastian Beverley NOVAK, MD  Active   ipratropium-albuterol  (DUONEB) 0.5-2.5 (3) MG/3ML SOLN [id number] Yes Inhale 3 mLs by nebulization every 6 (six) hours as needed. Rizwan, Saima, MD  Active   Lancet Device MISC [id number]  1 each by Does not apply route as directed. Dispense based on patient and insurance preference. Use up to four times daily as directed. (FOR ICD-10 E10.9, E11.9). Sebastian Beverley NOVAK, MD  Active   Lancets MISC [id number]  1 each by Does not apply route as directed. Dispense based on patient and insurance preference. Use up to four times daily as directed. (FOR ICD-10 E10.9, E11.9). Sebastian Beverley NOVAK, MD  Active   levofloxacin  (LEVAQUIN ) 750 MG tablet [id number] Yes Take 1 tablet (750 mg total) by mouth daily. Rizwan, Saima, MD  Active   losartan  (COZAAR ) 25 MG tablet [id number] Yes Take 0.5 tablets (12.5 mg total) by mouth daily. Billy Knee, FNP  Active Self  metFORMIN  (GLUCOPHAGE -XR) 750 MG 24 hr tablet [id number] Yes Take 750 mg by mouth daily with breakfast. [provider]  Active Self  nicotine  (NICODERM CQ  - DOSED IN MG/24 HOURS) 21 mg/24hr patch [id number] Yes Place 1 patch (21 mg total) onto the skin daily. Rizwan, Saima, MD  Active   OXYGEN [id number]  Inhale 2 L into the lungs continuous. [provider]  Active   predniSONE  (DELTASONE ) 10 MG tablet [id number] Yes Take 6 tablets by mouth daily with breakfast for 3 days, THEN take 4 tablets daily for 3 days, THEN take 2 tablets daily for 3 days, THEN take 1 tablet daily for 3 days, THEN take  0.5 tablets daily for 3 days. Rizwan, Saima, MD  Active   rosuvastatin  (CRESTOR ) 5 MG  tablet [id number] Yes Take 1 tablet by mouth once daily Sebastian Beverley NOVAK, MD  Active Self  senna-docusate (SENOKOT-S) 8.6-50 MG tablet [id number] Yes Take 1 tablet by mouth at bedtime as needed for mild constipation. Rizwan, Saima, MD  Active   tamsulosin  (FLOMAX ) 0.4 MG CAPS capsule [id number] Yes Take 1 capsule (0.4 mg total) by mouth daily. Sebastian Beverley NOVAK, MD  Active Self            Home Care and Equipment/Supplies: Were Home Health Services Ordered?: NA Any new equipment or medical supplies ordered?: Yes Name of Medical supply agency?: [employer] Were you able to get the equipment/medical supplies?: Yes (It took an extra day to get) Do you have any questions related to the use of the equipment/supplies?: No  Functional Questionnaire: Do you need assistance with bathing/showering or dressing?: No Do you need assistance with meal preparation?: No Do you need assistance with eating?: No Do you have difficulty maintaining continence: No Do you need assistance with getting out of bed/getting out of a chair/moving?: No Do you  have difficulty managing or taking your medications?: No  Follow up appointments reviewed: PCP Follow-up appointment confirmed?: Yes Date of PCP follow-up appointment?: 06/27/24 Follow-up Provider: Beverley Hummer, MD Specialist Hospital Follow-up appointment confirmed?: Yes Date of Specialist follow-up appointment?: 07/16/24 Follow-Up Specialty Provider:: Pulmonologist - [facility] Dorn Chill Do you need transportation to your follow-up appointment?: No Do you understand care options if your condition(s) worsen?: Yes-patient verbalized understanding  SDOH Interventions Today    Flowsheet Row Most Recent Value  SDOH Interventions   Food Insecurity Interventions Intervention Not Indicated  Housing Interventions Intervention Not Indicated  Transportation Interventions Intervention Not Indicated  Utilities Interventions Intervention Not Indicated     Goals Addressed             This Visit's Progress    VBCI Transitions of Care (TOC) Care Plan       Problems:  Recent Hospitalization for treatment of COPDCommunity Acquired Pneumonia Knowledge Deficit Related to Community Acquired Pneumonia  Goal:  Over the next 30 days, the patient will not experience hospital readmission  Interventions:  Transitions of Care: Doctor Visits  - discussed the importance of doctor visits  COPD Interventions: Advised patient to track and manage COPD triggers Advised patient to self assesses COPD action plan zone and make appointment with provider if in the yellow zone for 48 hours without improvement Assessed social determinant of health barriers Discussed the importance of adequate rest and management of fatigue with COPD Provided education about and advised patient to utilize infection prevention strategies to reduce risk of respiratory infection Screening for signs and symptoms of depression related to chronic disease state  Use of home oxygen  Patient Self Care Activities:  Attend all scheduled provider appointments Call pharmacy for medication refills 3-7 days in advance of running out of medications Call provider office for new concerns or questions  Notify RN Care Manager of TOC call rescheduling needs Participate in Transition of Care Program/Attend TOC scheduled calls Take medications as prescribed    Plan:  The patient has been provided with contact information for the care management team and has been advised to call with any health related questions or concerns.  06/27/24 Discussed and offered 30 day TOC program.  Patient agrees to weekly TOC calls.  The patient has been provided with contact information for the care management team and has been advised to call with any health -related questions or concerns.  The patient verbalized understanding with current plan of care.  The patient is directed to their insurance card regarding  availability of benefits coverage.          Richerd Fish, RN, BSN, CCM [facility] Management Coordinator Direct Dial: [phone]        "

## 2024-06-27 NOTE — Patient Instructions (Addendum)
 Follow up in 1 month or sooner if experiencing chest pain or shortness or breath Referral to pulmonology  Referral to Cardiology Referral to Endocrinology  START Aspirin  81 mg daily sent to pharmacy

## 2024-06-29 ENCOUNTER — Other Ambulatory Visit: Payer: Self-pay | Admitting: Nurse Practitioner

## 2024-06-29 ENCOUNTER — Inpatient Hospital Stay: Admitting: Family Medicine

## 2024-06-29 NOTE — Telephone Encounter (Signed)
 Requesting: Jardiance  10 MG Oral Tablet  Last Visit: 04/30/2024 Next Visit: Visit date not found Last Refill: 04/30/2024  Please Advise

## 2024-07-02 ENCOUNTER — Telehealth: Payer: Self-pay

## 2024-07-02 NOTE — Telephone Encounter (Signed)
 Atc patinet unable to reach patient [email or web address]

## 2024-07-02 NOTE — Telephone Encounter (Signed)
 Patient was prescribed lancets on 06/27/24. He has not received them. He is asking if the script needs to be resent.  Please advise,  Thank you

## 2024-07-03 ENCOUNTER — Telehealth: Payer: Self-pay

## 2024-07-03 NOTE — Progress Notes (Signed)
 Complex Care Management Note Care Guide Note  07/03/2024 Name: Sean Martinez MRN: [id number] DOB: 08/13/1960   Complex Care Management Outreach Attempts: An unsuccessful telephone outreach was attempted today to offer the patient information about available complex care management services.  Follow Up Plan:  Additional outreach attempts will be made to offer the patient complex care management information and services.   Encounter Outcome:  No Answer  [name]  [facility], [facility] VBCI Assistant Direct Dial: [phone]  Fax: [phone]

## 2024-07-05 NOTE — Progress Notes (Signed)
 Complex Care Management Note Care Guide Note  07/05/2024 Name: Sean Martinez MRN: [id number] DOB: July 16, 1960   Complex Care Management Outreach Attempts: A second unsuccessful outreach was attempted today to offer the patient with information about available complex care management services.  Follow Up Plan:  Additional outreach attempts will be made to offer the patient complex care management information and services.   Encounter Outcome:  No Answer  [name]  [facility] VBCI Assistant Direct Dial: [phone]  Fax: [phone]

## 2024-07-05 NOTE — Telephone Encounter (Signed)
 Spoke to patient and he will be going to get his lancets from pharmacy today, nothing further needed.

## 2024-07-06 ENCOUNTER — Telehealth: Payer: Self-pay

## 2024-07-06 ENCOUNTER — Other Ambulatory Visit: Payer: Self-pay

## 2024-07-06 NOTE — Transitions of Care (Post Inpatient/ED Visit) (Signed)
 " Transition of Care week 2  Visit Note  07/06/2024  Name: Sean Martinez MRN: [id number]          DOB: [birth year]  Situation: Patient enrolled in [facility] 30-day program. Visit completed with patient by telephone.   Background:   Initial Transition Care Management Follow-up Telephone Call Discharge Date and Diagnosis: 06/26/24, Right upper Lobe Pneumonia; COPD exacerbation   Past Medical History:  Diagnosis Date   Asthma    Coronary artery disease    MI, old    Type 2 diabetes mellitus with hyperglycemia, without long-term current use of insulin  (HCC) 08/10/2022    Assessment: Patient Reported Symptoms: Cognitive Cognitive Status: No symptoms reported, Able to follow simple commands, Alert and oriented to person, place, and time, Normal speech and language skills      Neurological Neurological Review of Symptoms: No symptoms reported (feeling stronger back at work) Neurological Management Strategies: Medication therapy, Routine screening Neurological Self-Management Outcome: 4 (good)  HEENT HEENT Symptoms Reported: Not assessed      Cardiovascular Cardiovascular Symptoms Reported: No symptoms reported Does patient have uncontrolled Hypertension?: Yes Is patient checking Blood Pressure at home?: No Cardiovascular Management Strategies: Medication therapy  Respiratory Respiratory Symptoms Reported: No symptoms reported Other Respiratory Symptoms: Patient states he is doing good back at work without oxygen and only had only one time he had to use his rescue inhaler Additional Respiratory Details: Patient encouraged to follow up with pulmonologist Respiratory Management Strategies: Medication therapy, Oxygen therapy Respiratory Self-Management Outcome: 3 (uncertain)  Endocrine Endocrine Symptoms Reported: Other Other symptoms related to hypoglycemia or hyperglycemia: denies symptoms Is patient diabetic?: Yes Is patient checking blood sugars at home?: Yes List most recent  blood sugar readings, include date and time of day: 216 to 249 range Endocrine Self-Management Outcome: 3 (uncertain)  Gastrointestinal Gastrointestinal Symptoms Reported: Not assessed Additional Gastrointestinal Details: states no issues with B/B      Genitourinary Genitourinary Symptoms Reported: Not assessed    Integumentary Integumentary Symptoms Reported: Not assessed Additional Integumentary Details: patient unable to review currently at work but states no issues Skin Management Strategies: Routine screening  Musculoskeletal Musculoskelatal Symptoms Reviewed: No symptoms reported        Psychosocial Psychosocial Symptoms Reported: No symptoms reported (Good back at work)         There were no vitals filed for this visit. Pain Scale: 0-10 Pain Score: 0-No pain  Medications Reviewed Today     Reviewed by Eilleen Richerd GRADE, RN (Registered Nurse) on 07/06/24 at 1313  Med List Status: <None>   Medication Order Taking? Sig Documenting Provider Last Dose Status Informant  acetaminophen  (TYLENOL ) 325 MG tablet [id number] Yes Take 2 tablets (650 mg total) by mouth every 6 (six) hours as needed for mild pain (pain score 1-3) or fever. Vicci Burnard SAUNDERS, PA-C  Active Self  albuterol  (VENTOLIN  HFA) 108 (90 Base) MCG/ACT inhaler [id number] Yes Inhale 1-2 puffs into the lungs every 6 (six) hours as needed for wheezing or shortness of breath. Sebastian Beverley NOVAK, MD  Active Self  arformoterol  (BROVANA ) 15 MCG/2ML NEBU [id number]  Inhale 2 mLs (15 mcg total) by nebulization 2 (two) times daily. Rizwan, Saima, MD  Active   aspirin  EC 81 MG tablet [id number] Yes Take 1 tablet (81 mg total) by mouth daily. Swallow whole. Sebastian Beverley NOVAK, MD  Active   Blood Glucose Monitoring Suppl DEVI [id number]  1 each by Does not apply route as directed. Dispense based on patient and insurance preference. Use  up to four times daily as directed. (FOR ICD-10 E10.9, E11.9). Sebastian Beverley NOVAK, MD  Active    budesonide  (PULMICORT ) 0.25 MG/2ML nebulizer solution [id number]  Inhale 2 mLs (0.25 mg total) by nebulization 2 (two) times daily. Rizwan, Saima, MD  Active   budesonide -glycopyrrolate-formoterol (BREZTRI  AEROSPHERE) 160-9-4.8 MCG/ACT AERO inhaler [id number] Yes Inhale 2 puffs into the lungs 2 (two) times daily. Billy Knee, FNP  Active Self  Colchicine  0.6 MG CAPS [id number]  At onset of flair, Take 1.2 mg (two tablets). Take 0.6 mg (one tablet) one hour later. Then, take 0.6 mg (one tablet) once daily for up to 7 days as needed for swelling and pain.  Patient not taking: Reported on 06/23/2024   Sebastian Beverley NOVAK, MD  Active Self  Continuous Glucose Sensor (FREESTYLE LIBRE 3 PLUS SENSOR) [place] [id number]  Change sensor every 15 days. Sebastian Beverley NOVAK, MD  Active   Dextromethorphan -guaiFENesin  (ROBAFEN DM) 20-200 MG/20ML LIQD [id number] Yes Take 5 mLs by mouth every 4 (four) hours as needed (chest congestion). Rizwan, Saima, MD  Active   Doxylamine Succinate, Sleep, (SLEEP AID PO) [id number]  Take 1 tablet by mouth at bedtime as needed.  Patient not taking: Reported on 06/27/2024   [provider]  Active Self  empagliflozin  (JARDIANCE ) 10 MG TABS tablet [id number] Yes Take 1 tablet by mouth once daily Sebastian Beverley NOVAK, MD  Active   glipiZIDE  (GLUCOTROL  XL) 5 MG 24 hr tablet [id number] Yes Please take this if your fasting AM blood glucose is > 150 Rizwan, Saima, MD  Active   Glucose Blood (BLOOD GLUCOSE TEST STRIPS) STRP [id number]  1 each by Does not apply route as directed. Dispense based on patient and insurance preference. Use up to four times daily as directed. (FOR ICD-10 E10.9, E11.9). Sebastian Beverley NOVAK, MD  Active   guaiFENesin  (MUCINEX ) 600 MG 12 hr tablet [id number] Yes Take 1 tablet (600 mg total) by mouth 2 (two) times daily. Rizwan, Saima, MD  Active   ibuprofen  (ADVIL ) 400 MG tablet [id number]  Take 1 tablet (400 mg total) by mouth every 4 (four) hours as needed for fever or headache.  Earley Saucer, MD  Active   ipratropium-albuterol  (DUONEB) 0.5-2.5 (3) MG/3ML nebulizer solution 3 mL [id number]   Sebastian Beverley NOVAK, MD  Active   ipratropium-albuterol  (DUONEB) 0.5-2.5 (3) MG/3ML SOLN [id number] Yes Inhale 3 mLs by nebulization every 6 (six) hours as needed. Rizwan, Saima, MD  Active   Lancet Device MISC [id number]  1 each by Does not apply route as directed. Dispense based on patient and insurance preference. Use up to four times daily as directed. (FOR ICD-10 E10.9, E11.9). Sebastian Beverley NOVAK, MD  Active   Lancets MISC [id number]  1 each by Does not apply route as directed. Dispense based on patient and insurance preference. Use up to four times daily as directed. (FOR ICD-10 E10.9, E11.9). Sebastian Beverley NOVAK, MD  Active   levofloxacin  (LEVAQUIN ) 750 MG tablet [id number]  Take 1 tablet (750 mg total) by mouth daily. Rizwan, Saima, MD  Active   losartan  (COZAAR ) 25 MG tablet [id number] Yes Take 0.5 tablets (12.5 mg total) by mouth daily. Billy Knee, FNP  Active Self  metFORMIN  (GLUCOPHAGE -XR) 750 MG 24 hr tablet [id number] Yes Take 750 mg by mouth daily with breakfast. [provider]  Active Self  nicotine  (NICODERM CQ  - DOSED IN MG/24 HOURS) 21 mg/24hr patch [id number]  Place 1 patch (21 mg total) onto the skin daily. Rizwan, Saima, MD  Active  OXYGEN [id number] Yes Inhale 2 L into the lungs continuous. [provider]  Active   predniSONE  (DELTASONE ) 10 MG tablet [id number] Yes Take 6 tablets by mouth daily with breakfast for 3 days, THEN take 4 tablets daily for 3 days, THEN take 2 tablets daily for 3 days, THEN take 1 tablet daily for 3 days, THEN take  0.5 tablets daily for 3 days. Rizwan, Saima, MD  Active            Med Note LESLY, RICHERD GRADE   Fri Jul 06, 2024  1:10 PM) States he is taking the medications and is on target to finish up soon but didn't know what exact day  rosuvastatin  (CRESTOR ) 5 MG tablet [id number] Yes Take 1 tablet by mouth once daily Sebastian Beverley NOVAK, MD  Active Self  senna-docusate (SENOKOT-S) 8.6-50 MG tablet [id number] Yes Take 1 tablet by mouth at bedtime as needed for mild constipation. Rizwan, Saima, MD  Active   tamsulosin  (FLOMAX ) 0.4 MG CAPS capsule [id number] Yes Take 1 capsule (0.4 mg total) by mouth daily. Sebastian Beverley NOVAK, MD  Active Self            Recommendation:   Continue Current Plan of Care  Follow Up Plan:   Telephone follow-up in 1 week Consult your pulmonologist about the oxygen needs for when at work. Patient states he has follow up on 07/16/24  Richerd Fish, RN, BSN, CCM [place]  [facility] Management Coordinator Direct Dial: [phone]           "

## 2024-07-06 NOTE — Patient Instructions (Signed)
 Visit Information  Thank you for taking time to visit with me today. Please don't hesitate to contact me if I can be of assistance to you before our next scheduled telephone appointment.  Our next appointment is by telephone on 07/12/24 at 12:30 pm  Following is a copy of your care plan:   Goals Addressed             This Visit's Progress    VBCI Transitions of Care (TOC) Care Plan   Improving    Problems:  Recent Hospitalization for treatment of COPDCommunity Acquired Pneumonia Knowledge Deficit Related to Community Acquired Pneumonia 07/06/24 Patient is not using oxygen continuously at this time as he has gone back to work and using inhalers only if needed. States has not needed it but once since back at work. States he will be finished with Prednisone  and antibiotic soon. Reviewed follow up appointments with Pulmonologist for 07/16/24 and importance of keeping appointment.  Patient states blood sugars are still in the 200's but no symptoms. Patient currently at work during this call.  Goal:  Over the next 30 days, the patient will not experience hospital readmission  Interventions:  Transitions of Care: Doctor Visits  - discussed the importance of doctor visits  COPD Interventions: Advised patient to track and manage COPD triggers Advised patient to self assesses COPD action plan zone and make appointment with provider if in the yellow zone for 48 hours without improvement Assessed social determinant of health barriers Discussed the importance of adequate rest and management of fatigue with COPD Provided education about and advised patient to utilize infection prevention strategies to reduce risk of respiratory infection Screening for signs and symptoms of depression related to chronic disease state  Use of home oxygen 07/06/24 Encouraged to use oxygen as prescribed patient has Pulmonology follow up in 10 days, states he has inhalers and nebs which is helping   Patient Self Care  Activities:  Attend all scheduled provider appointments Call pharmacy for medication refills 3-7 days in advance of running out of medications Call provider office for new concerns or questions  Notify RN Care Manager of [facility] call rescheduling needs Participate in Transition of Care Program/Attend TOC scheduled calls Take medications as prescribed   07/06/24 Encouraged to continue medications as prescribed, states, I am taking them and almost finished with the Prednisone  and antibiotic.  Plan:  The patient has been provided with contact information for the care management team and has been advised to call with any health related questions or concerns.  06/27/24 Discussed and offered 30 day TOC program.  Patient agrees to weekly TOC calls.  The patient has been provided with contact information for the care management team and has been advised to call with any health -related questions or concerns.  The patient verbalized understanding with current plan of care.  The patient is directed to their insurance card regarding availability of benefits coverage.   07/06/24 Discussed ongoing medications, and follow up and encouraged to use oxygen and/or speak with pulmonologist. Verbalized understanding and scheduled follow up TOC call for 07/12/24 at 12:30 pm.         Patient verbalizes understanding of instructions and care plan provided today and agrees to view in MyChart. Active MyChart status and patient understanding of how to access instructions and care plan via MyChart confirmed with patient.     The patient has been provided with contact information for the care management team and has been advised to call with any  health related questions or concerns.   Please call the care guide team at [phone] if you need to cancel or reschedule your appointment.   Please call the Suicide and Crisis Lifeline: 988 call the [place]  National Suicide Prevention Lifeline: (415)239-6442 or TTY: 8024854089 TTY  539-184-6741) to talk to a trained counselor call 1-800-273-TALK (toll free, 24 hour hotline) go to [facility] [address] [place] [phone]) call 911 if you are experiencing a Mental Health or Behavioral Health Crisis or need someone to talk to.  Richerd Fish, RN, BSN, CCM [facility], [facility] Management Coordinator Direct Dial: [phone]

## 2024-07-09 NOTE — Progress Notes (Signed)
 Complex Care Management Note  Care Guide Note 07/09/2024 Name: Sean Martinez MRN: [id number] DOB: 11-14-1960  Sean Martinez is a 64 y.o. year old male who sees Sean Beverley NOVAK, MD for primary care. I reached out to Sean Martinez by phone today to offer complex care management services.  Sean Martinez was given information about Complex Care Management services today including:   The Complex Care Management services include support from the care team which includes your Nurse Care Manager, Clinical Social Worker, or Pharmacist.  The Complex Care Management team is here to help remove barriers to the health concerns and goals most important to you. Complex Care Management services are voluntary, and the patient may decline or stop services at any time by request to their care team member.   Complex Care Management Consent Status: Patient agreed to services and verbal consent obtained.   Follow up plan:  Telephone appointment with complex care management team member scheduled for:  07/30/24 at 2:30 p.m.   Encounter Outcome:  Patient Scheduled  Sean Martinez [facility]  [facility], [facility] VBCI Assistant Direct Dial: [phone]  Fax: [phone]

## 2024-07-09 NOTE — Progress Notes (Signed)
 Complex Care Management Note Care Guide Note  07/09/2024 Name: Sean Martinez MRN: [id number] DOB: [birth year]   Complex Care Management Outreach Attempts: A third unsuccessful outreach was attempted today to offer the patient with information about available complex care management services.  Follow Up Plan:  No further outreach attempts will be made at this time. We have been unable to contact the patient to offer or enroll patient in complex care management services.  Encounter Outcome:  No Answer  [name]  [facility], [facility] VBCI Assistant Direct Dial: [phone]  Fax: [phone]

## 2024-07-12 ENCOUNTER — Telehealth: Payer: Self-pay

## 2024-07-13 ENCOUNTER — Telehealth: Payer: Self-pay

## 2024-07-13 DIAGNOSIS — J441 Chronic obstructive pulmonary disease with (acute) exacerbation: Secondary | ICD-10-CM

## 2024-07-13 DIAGNOSIS — E1165 Type 2 diabetes mellitus with hyperglycemia: Secondary | ICD-10-CM

## 2024-07-13 NOTE — Transitions of Care (Post Inpatient/ED Visit) (Addendum)
 Care Management  Transitions of Care Program Transitions of Care Post-discharge week 3  07/13/2024 Name: Sean Martinez MRN: [id number] DOB: 1960/07/30  Subjective: [name] is a 64 y.o. year old male who is a primary care patient of Sebastian Beverley NOVAK, MD. The Care Management team was unable to reach the patient by phone to assess and address transitions of care needs.   Plan: Additional outreach attempts will be made to reach the patient enrolled in the [facility] Program (Post Inpatient/ED Visit).  Richerd Fish, RN, BSN, CCM [facility], [facility] Health RN Care Manager Direct Dial: [phone]   11:59 am Patient called and left a voicemail.  12:20 pm called patient back and he states he is back to working now and has limited time to talk but has his phone with him. He would like a referral for social worker to see if he can get assistance into senior living facility, if possible, as he is currently living in a rooming house.  Patient currently unable to commit to an appointment today for follow up calls with this writer. Plan: will refer per request for social worker follow up.   Richerd Fish, RN, BSN, CCM [facility], [facility] Health RN Care Manager Direct Dial: [phone]

## 2024-07-13 NOTE — Addendum Note (Signed)
 Addended by: EILLEEN RICHERD GRADE on: 07/13/2024 12:40 PM   Modules accepted: Orders

## 2024-07-16 ENCOUNTER — Ambulatory Visit: Admitting: Pulmonary Disease

## 2024-07-16 ENCOUNTER — Telehealth: Payer: Self-pay

## 2024-07-16 NOTE — Transitions of Care (Post Inpatient/ED Visit) (Signed)
 Care Management  Transitions of Care Program Transitions of Care Post-discharge week 3/4  07/16/2024 Name: [name] MRN: [id number] DOB: 03/17/1961  Subjective: Sean Martinez is a 64 y.o. year old male who is a primary care patient of Sebastian Beverley NOVAK, MD. The Care Management team was unable to reach the patient by phone to assess and address transitions of care needs.   Plan: No further outreach attempts will be made at this time.  We have been unable to reach the patient.  Referral has been placed 07/13/24 per patient request for social worker follow up/assistance for housing resources.  Richerd Fish, RN, BSN, CCM [facility], [facility] Health RN Care Manager Direct Dial: [phone]

## 2024-07-17 ENCOUNTER — Telehealth: Payer: Self-pay

## 2024-07-17 ENCOUNTER — Ambulatory Visit: Payer: Self-pay

## 2024-07-17 NOTE — Telephone Encounter (Signed)
 FYI Only or Action Required?: FYI only for provider: appointment scheduled on 07/23/24.  Patient was last seen in primary care on 06/27/2024 by Sebastian Beverley NOVAK, MD.  Called Nurse Triage reporting Numbness.  Symptoms began several weeks ago.  Interventions attempted: Rest, hydration, or home remedies.  Symptoms are: unchanged.  Triage Disposition: See PCP When Office is Open (Within 3 Days)  Patient/caregiver understands and will follow disposition?: Yes   Reason for Disposition  [1] Numbness or tingling on both sides of body AND [2] is a new symptom present > 24 hours  Answer Assessment - Initial Assessment Questions Patient states that he started to experience numbness in both hands at the fingertips a few weeks ago. Nothing is relieving the numbness. He reports a family history of neuropathy and is concerned it may be this. Office visit advised, patient already has TOC appt for 07/23/24 and would like to discuss symptoms at this appt.   1. SYMPTOM: What is the main symptom you are concerned about? (e.g., weakness, numbness)     Numbness in tips of fingers  2. ONSET: When did this start? (e.g., minutes, hours, days; while sleeping)     A couple weeks ago  3. LAST NORMAL: When was the last time you (the patient) were normal (no symptoms)?     A couple weeks ago  4. PATTERN Does this come and go, or has it been constant since it started?  Is it present now?     Constant  5. CARDIAC SYMPTOMS: Have you had any of the following symptoms: chest pain, difficulty breathing, palpitations?     Does not report any cardiac symptoms  6. NEUROLOGIC SYMPTOMS: Have you had any of the following symptoms: headache, dizziness, vision loss, double vision, changes in speech, unsteady on your feet?     States he has headaches everyday, but prior to presentation of numbness  7. OTHER SYMPTOMS: Do you have any other symptoms?     No  8. PREGNANCY: Is there any chance you are  pregnant? When was your last menstrual period?     NA  Protocols used: Neurologic Deficit-A-AH  Reason for Triage: Patient states he is having numbness on all his fingers on both hands, nothing is stopping it.

## 2024-07-17 NOTE — Progress Notes (Signed)
 Complex Care Management Note  Care Guide Note 07/17/2024 Name: Sean Martinez MRN: [id number] DOB: Nov 30, 1960  [name] is a 64 y.o. year old male who sees Sebastian Beverley NOVAK, MD for primary care. I reached out to Deward [address] [address] by phone today to offer complex care management services.  [name] was given information about Complex Care Management services today including:   The Complex Care Management services include support from the care team which includes your Nurse Care Manager, Clinical Social Worker, or Pharmacist.  The Complex Care Management team is here to help remove barriers to the health concerns and goals most important to you. Complex Care Management services are voluntary, and the patient may decline or stop services at any time by request to their care team member.   Complex Care Management Consent Status: Patient agreed to services and verbal consent obtained.   Follow up plan:  Telephone appointment with complex care management team member scheduled for:  08/03/24 at 3:00 p.m.  Encounter Outcome:  Patient Scheduled  [name]  [facility], [facility] VBCI Assistant Direct Dial: [phone]  Fax: [phone]

## 2024-07-18 ENCOUNTER — Other Ambulatory Visit: Payer: Self-pay | Admitting: Family Medicine

## 2024-07-18 DIAGNOSIS — E1129 Type 2 diabetes mellitus with other diabetic kidney complication: Secondary | ICD-10-CM

## 2024-07-18 NOTE — Telephone Encounter (Signed)
 Called pt to see if he would like to be seen sooner for the numbness in his hands prior to the [facility] appt. Pt declined, is ok with waiting until 07/23/24.

## 2024-07-20 ENCOUNTER — Other Ambulatory Visit: Payer: Self-pay

## 2024-07-20 ENCOUNTER — Telehealth: Payer: Self-pay

## 2024-07-20 NOTE — Telephone Encounter (Signed)
 Copied from CRM [id number]. Topic: Clinical - Medication Refill >> Jul 19, 2024  4:55 PM Taleah C wrote: Medication: smoke patches, he was prescribed them when he was in the hospital  Has the patient contacted their pharmacy? No (Agent: If no, request that the patient contact the pharmacy for the refill. If patient does not wish to contact the pharmacy document the reason why and proceed with request.) (Agent: If yes, when and what did the pharmacy advise?)  This is the patient's preferred pharmacy:  [facility] [address] [place] - [address] [place] [address] Phone: [phone] Fax: [phone]  Is this the correct pharmacy for this prescription? Yes If no, delete pharmacy and type the correct one.   Has the prescription been filled recently? Yes  Is the patient out of the medication? Yes  Has the patient been seen for an appointment in the last year OR does the patient have an upcoming appointment? Yes  Can we respond through MyChart? No  Agent: Please be advised that Rx refills may take up to 3 business days. We ask that you follow-up with your pharmacy.

## 2024-07-20 NOTE — Telephone Encounter (Signed)
 Patient picked up the RX that was sent in by the hospital and several of them had fallen on the floor.  He is out and would like for you to prescribe them for him? Please review and advise.  Thanks. Dm/cma

## 2024-07-23 ENCOUNTER — Encounter: Admitting: Internal Medicine

## 2024-07-23 ENCOUNTER — Ambulatory Visit: Admitting: Family Medicine

## 2024-07-23 MED ORDER — NICOTINE 21 MG/24HR TD PT24: 21.0000 mg | MEDICATED_PATCH | Freq: Every day | TRANSDERMAL | 0 refills | Status: AC

## 2024-07-24 ENCOUNTER — Other Ambulatory Visit: Payer: Self-pay

## 2024-07-24 ENCOUNTER — Encounter (HOSPITAL_COMMUNITY): Payer: Self-pay

## 2024-07-24 ENCOUNTER — Emergency Department (HOSPITAL_COMMUNITY)

## 2024-07-24 ENCOUNTER — Inpatient Hospital Stay (HOSPITAL_COMMUNITY)
Admission: EM | Admit: 2024-07-24 | Source: Ambulatory Visit | Attending: Internal Medicine | Admitting: Internal Medicine

## 2024-07-24 ENCOUNTER — Ambulatory Visit: Payer: Self-pay | Admitting: *Deleted

## 2024-07-24 DIAGNOSIS — J188 Other pneumonia, unspecified organism: Secondary | ICD-10-CM | POA: Diagnosis not present

## 2024-07-24 DIAGNOSIS — J189 Pneumonia, unspecified organism: Principal | ICD-10-CM

## 2024-07-24 HISTORY — DX: Chronic obstructive pulmonary disease, unspecified: J44.9

## 2024-07-24 LAB — CBC
HCT: 43.5 % (ref 39.0–52.0)
Hemoglobin: 14.5 g/dL (ref 13.0–17.0)
MCH: 32 pg (ref 26.0–34.0)
MCHC: 33.3 g/dL (ref 30.0–36.0)
MCV: 96 fL (ref 80.0–100.0)
Platelets: 310 10*3/uL (ref 150–400)
RBC: 4.53 MIL/uL (ref 4.22–5.81)
RDW: 14 % (ref 11.5–15.5)
WBC: 4.8 10*3/uL (ref 4.0–10.5)
nRBC: 0 % (ref 0.0–0.2)

## 2024-07-24 LAB — BASIC METABOLIC PANEL WITH GFR
Anion gap: 10 (ref 5–15)
BUN: 15 mg/dL (ref 8–23)
CO2: 26 mmol/L (ref 22–32)
Calcium: 9.7 mg/dL (ref 8.9–10.3)
Chloride: 97 mmol/L — ABNORMAL LOW (ref 98–111)
Creatinine, Ser: 0.74 mg/dL (ref 0.61–1.24)
GFR, Estimated: >60 mL/min
Glucose, Bld: 176 mg/dL — ABNORMAL HIGH (ref 70–99)
Potassium: 4.4 mmol/L (ref 3.5–5.1)
Sodium: 134 mmol/L — ABNORMAL LOW (ref 135–145)

## 2024-07-24 LAB — TROPONIN T, HIGH SENSITIVITY
Troponin T High Sensitivity: 12 ng/L (ref 0–19)
Troponin T High Sensitivity: 11 ng/L (ref 0–19)

## 2024-07-24 LAB — RESP PANEL BY RT-PCR (RSV, FLU A&B, COVID)  RVPGX2
Influenza A by PCR: NEGATIVE
Influenza B by PCR: NEGATIVE
Resp Syncytial Virus by PCR: NEGATIVE
SARS Coronavirus 2 by RT PCR: NEGATIVE

## 2024-07-24 LAB — GLUCOSE, CAPILLARY
Glucose-Capillary: 244 mg/dL — ABNORMAL HIGH (ref 70–99)
Glucose-Capillary: 443 mg/dL — ABNORMAL HIGH (ref 70–99)

## 2024-07-24 LAB — I-STAT CG4 LACTIC ACID, ED: Lactic Acid, Venous: 1.2 mmol/L (ref 0.5–1.9)

## 2024-07-24 MED ORDER — INSULIN ASPART 100 UNIT/ML IJ SOLN
0.0000 [IU] | Freq: Every day | INTRAMUSCULAR | Status: AC
  Administered 2024-07-27: 2 [IU] via SUBCUTANEOUS
  Filled 2024-07-24: qty 2

## 2024-07-24 MED ORDER — VANCOMYCIN HCL IN DEXTROSE 1-5 GM/200ML-% IV SOLN: 1000.0000 mg | Freq: Once | INTRAVENOUS | Status: DC

## 2024-07-24 MED ORDER — ROSUVASTATIN CALCIUM 5 MG PO TABS
5.0000 mg | ORAL_TABLET | Freq: Every day | ORAL | Status: AC
  Administered 2024-07-25 – 2024-07-27 (×3): 5 mg via ORAL
  Filled 2024-07-24 (×3): qty 1

## 2024-07-24 MED ORDER — VANCOMYCIN HCL 1250 MG/250ML IV SOLN
1250.0000 mg | Freq: Two times a day (BID) | INTRAVENOUS | Status: DC
  Administered 2024-07-25: 1250 mg via INTRAVENOUS
  Filled 2024-07-24: qty 250

## 2024-07-24 MED ORDER — IPRATROPIUM BROMIDE 0.02 % IN SOLN
0.5000 mg | Freq: Once | RESPIRATORY_TRACT | Status: AC
  Administered 2024-07-24: 0.5 mg via RESPIRATORY_TRACT
  Filled 2024-07-24: qty 2.5

## 2024-07-24 MED ORDER — MAGNESIUM SULFATE 2 GM/50ML IV SOLN
2.0000 g | Freq: Once | INTRAVENOUS | Status: AC
  Administered 2024-07-24: 2 g via INTRAVENOUS
  Filled 2024-07-24: qty 50

## 2024-07-24 MED ORDER — INSULIN ASPART 100 UNIT/ML IJ SOLN
0.0000 [IU] | Freq: Three times a day (TID) | INTRAMUSCULAR | Status: AC
  Administered 2024-07-25: 8 [IU] via SUBCUTANEOUS
  Administered 2024-07-25: 2 [IU] via SUBCUTANEOUS
  Administered 2024-07-25 – 2024-07-26 (×2): 3 [IU] via SUBCUTANEOUS
  Administered 2024-07-26: 8 [IU] via SUBCUTANEOUS
  Administered 2024-07-26: 2 [IU] via SUBCUTANEOUS
  Administered 2024-07-27: 5 [IU] via SUBCUTANEOUS
  Administered 2024-07-27: 2 [IU] via SUBCUTANEOUS
  Administered 2024-07-27: 3 [IU] via SUBCUTANEOUS
  Filled 2024-07-24 (×2): qty 3
  Filled 2024-07-24: qty 2
  Filled 2024-07-24: qty 3
  Filled 2024-07-24: qty 8
  Filled 2024-07-24: qty 2
  Filled 2024-07-24: qty 8
  Filled 2024-07-24: qty 2
  Filled 2024-07-24: qty 5

## 2024-07-24 MED ORDER — TAMSULOSIN HCL 0.4 MG PO CAPS
0.4000 mg | ORAL_CAPSULE | Freq: Every day | ORAL | Status: AC
  Administered 2024-07-25 – 2024-07-27 (×3): 0.4 mg via ORAL
  Filled 2024-07-24 (×3): qty 1

## 2024-07-24 MED ORDER — LOSARTAN POTASSIUM 25 MG PO TABS
12.5000 mg | ORAL_TABLET | Freq: Every day | ORAL | Status: AC
  Administered 2024-07-24 – 2024-07-27 (×4): 12.5 mg via ORAL
  Filled 2024-07-24 (×4): qty 1

## 2024-07-24 MED ORDER — ALBUTEROL SULFATE (2.5 MG/3ML) 0.083% IN NEBU
10.0000 mg/h | INHALATION_SOLUTION | Freq: Once | RESPIRATORY_TRACT | Status: AC
  Administered 2024-07-24: 10 mg/h via RESPIRATORY_TRACT
  Filled 2024-07-24: qty 3

## 2024-07-24 MED ORDER — SODIUM CHLORIDE 3 % IN NEBU: 4.0000 mL | INHALATION_SOLUTION | Freq: Once | RESPIRATORY_TRACT | Status: DC

## 2024-07-24 MED ORDER — OXYCODONE HCL 5 MG PO TABS
5.0000 mg | ORAL_TABLET | ORAL | Status: AC | PRN
  Administered 2024-07-26 – 2024-07-27 (×4): 5 mg via ORAL
  Filled 2024-07-24 (×3): qty 1

## 2024-07-24 MED ORDER — SODIUM CHLORIDE 0.9 % IV SOLN: 2.0000 g | Freq: Once | INTRAVENOUS | Status: DC

## 2024-07-24 MED ORDER — POLYETHYLENE GLYCOL 3350 17 G PO PACK: 17.0000 g | PACK | Freq: Every day | ORAL | Status: AC | PRN

## 2024-07-24 MED ORDER — SODIUM CHLORIDE 3 % IN NEBU: 4.0000 mL | INHALATION_SOLUTION | Freq: Once | RESPIRATORY_TRACT | Status: AC | PRN

## 2024-07-24 MED ORDER — IPRATROPIUM-ALBUTEROL 0.5-2.5 (3) MG/3ML IN SOLN: 3.0000 mL | Freq: Once | RESPIRATORY_TRACT | Status: DC

## 2024-07-24 MED ORDER — METHYLPREDNISOLONE SODIUM SUCC 125 MG IJ SOLR
125.0000 mg | Freq: Once | INTRAMUSCULAR | Status: AC
  Administered 2024-07-24: 125 mg via INTRAVENOUS
  Filled 2024-07-24: qty 2

## 2024-07-24 MED ORDER — ENOXAPARIN SODIUM 40 MG/0.4ML IJ SOSY: 40.0000 mg | PREFILLED_SYRINGE | Freq: Every day | INTRAMUSCULAR | Status: DC

## 2024-07-24 MED ORDER — IOHEXOL 350 MG/ML SOLN
75.0000 mL | Freq: Once | INTRAVENOUS | Status: AC | PRN
  Administered 2024-07-24: 75 mL via INTRAVENOUS

## 2024-07-24 MED ORDER — INSULIN ASPART 100 UNIT/ML IJ SOLN
15.0000 [IU] | Freq: Once | INTRAMUSCULAR | Status: AC
  Administered 2024-07-24: 15 [IU] via SUBCUTANEOUS
  Filled 2024-07-24: qty 15

## 2024-07-24 MED ORDER — ONDANSETRON HCL 4 MG PO TABS: 4.0000 mg | ORAL_TABLET | Freq: Four times a day (QID) | ORAL | Status: AC | PRN

## 2024-07-24 MED ORDER — INSULIN ASPART 100 UNIT/ML IJ SOLN: 0.0000 [IU] | Freq: Three times a day (TID) | INTRAMUSCULAR | Status: DC

## 2024-07-24 MED ORDER — NICOTINE 21 MG/24HR TD PT24
21.0000 mg | MEDICATED_PATCH | Freq: Every day | TRANSDERMAL | Status: AC
  Administered 2024-07-24 – 2024-07-27 (×4): 21 mg via TRANSDERMAL
  Filled 2024-07-24 (×4): qty 1

## 2024-07-24 MED ORDER — ONDANSETRON HCL 4 MG/2ML IJ SOLN: 4.0000 mg | Freq: Four times a day (QID) | INTRAMUSCULAR | Status: AC | PRN

## 2024-07-24 MED ORDER — ACETAMINOPHEN 650 MG RE SUPP: 650.0000 mg | Freq: Four times a day (QID) | RECTAL | Status: AC | PRN

## 2024-07-24 MED ORDER — SODIUM CHLORIDE 0.9 % IV SOLN
2.0000 g | Freq: Three times a day (TID) | INTRAVENOUS | Status: AC
  Administered 2024-07-24 – 2024-07-27 (×10): 2 g via INTRAVENOUS
  Filled 2024-07-24 (×10): qty 12.5

## 2024-07-24 MED ORDER — INSULIN ASPART 100 UNIT/ML IJ SOLN: 0.0000 [IU] | Freq: Every day | INTRAMUSCULAR | Status: DC

## 2024-07-24 MED ORDER — MELATONIN 3 MG PO TABS
3.0000 mg | ORAL_TABLET | Freq: Every evening | ORAL | Status: AC | PRN
  Administered 2024-07-24 – 2024-07-27 (×4): 3 mg via ORAL
  Filled 2024-07-24 (×4): qty 1

## 2024-07-24 MED ORDER — ACETAMINOPHEN 325 MG PO TABS
650.0000 mg | ORAL_TABLET | Freq: Four times a day (QID) | ORAL | Status: AC | PRN
  Administered 2024-07-27 (×2): 650 mg via ORAL
  Filled 2024-07-24 (×2): qty 2

## 2024-07-24 MED ORDER — ALBUTEROL SULFATE (2.5 MG/3ML) 0.083% IN NEBU
2.5000 mg | INHALATION_SOLUTION | RESPIRATORY_TRACT | Status: AC | PRN
  Administered 2024-07-26 (×2): 2.5 mg via RESPIRATORY_TRACT
  Filled 2024-07-24 (×2): qty 3

## 2024-07-24 MED ORDER — SODIUM CHLORIDE 0.9 % IV SOLN
2.0000 g | Freq: Once | INTRAVENOUS | Status: AC
  Administered 2024-07-24: 2 g via INTRAVENOUS
  Filled 2024-07-24: qty 12.5

## 2024-07-24 MED ORDER — BUDESON-GLYCOPYRROL-FORMOTEROL 160-9-4.8 MCG/ACT IN AERO
2.0000 | INHALATION_SPRAY | Freq: Two times a day (BID) | RESPIRATORY_TRACT | Status: AC
  Administered 2024-07-24 – 2024-07-27 (×7): 2 via RESPIRATORY_TRACT
  Filled 2024-07-24: qty 5.9

## 2024-07-24 MED ORDER — VANCOMYCIN HCL 1500 MG/300ML IV SOLN
1500.0000 mg | Freq: Once | INTRAVENOUS | Status: AC
  Administered 2024-07-24: 1500 mg via INTRAVENOUS
  Filled 2024-07-24: qty 300

## 2024-07-24 NOTE — ED Triage Notes (Signed)
 Patient was at work and had trouble breathing while standing. Has centralized chest pain and he feels like he is sweating off and on. Denies history of blood clots. Recently hospitalized with pneumonia. Has COPD and asthma.

## 2024-07-24 NOTE — Progress Notes (Signed)
 ED Pharmacy Antibiotic Sign Off An antibiotic consult was received from an ED provider for cefepime  per pharmacy dosing for HCAP. A chart review was completed to assess appropriateness.   The following one time order(s) were placed:  Cefepime  2 g IV  Further antibiotic and/or antibiotic pharmacy consults should be ordered by the admitting provider if indicated.   Thank you for allowing pharmacy to be a part of this patient's care.   Eleanor Agent, [facility]  Clinical Pharmacist 07/24/24 5:31 PM

## 2024-07-24 NOTE — Telephone Encounter (Signed)
Noted.  Patient went to ED. 

## 2024-07-24 NOTE — H&P (Signed)
 " History and Physical    Patient: Sean Martinez FMW:[id number] DOB: 19-Feb-1961 DOA: 07/24/2024 DOS: the patient was seen and examined on 07/24/2024 PCP: Sebastian Beverley NOVAK, MD  Patient coming from: Home  Chief Complaint: Shortness of breath, cough, fever/chills/sweats Chief Complaint  Patient presents with   Shortness of Breath   HPI: Sean Martinez is a 64 y.o. male with medical history significant of COPD, DM2, HTN, HLD, BPH, tobacco use disorder who presented to [facility] ED on 07/24/2024 with shortness of breath, cough, fever/chills/sweats while at work today.  Acute onset.  Recently hospitalized 1/2 - 1/6 for Pseudomonas pneumonia, initially treated with azithromycin  and ceftriaxone  and discharged on 7-day course of Levaquin .  Patient reports he has been doing well since completing his antibiotics until today.  He works at a [employer], does not contribute to any known sick contacts although.  Denies any recent travel, no changes in the medication.  Denies headache, no visual disturbance, no abdominal pain, no fatigue, no weight loss, no paresthesias.  In the ED, temperature 98.9 F, HR 112, RR 24, BP 108/78, SpO2 93% on 2 L nasal cannula.  WBC 4.8, hemoglobin 14.5, plate count 689.  Sodium 134, potassium 4.4, chloride 97, CO2 26, glucose 176, BUN 15, creatinine 0.74.  High sensitive troponin 12 followed by 11.  COVID/influenza/RSV PCR negative.  Chest x-ray with hazy airspace opacities right upper lung zone with possible cavitation measuring 4.5 cm concerning for cavitary bronchopneumonia including tuberculosis, nodular opacity left upper lobe measuring 2 cm.  CT angiogram chest negative for pulmonary embolism, extensive multifocal airspace consolidation involving all lobes largest 4.2 cm posterior right upper lobe with central cavitation.  Patient was started on vancomycin , cefepime .  Received IV Solu-Medrol , magnesium , albuterol  neb.  TRH consulted for admission for further  evaluation and management of multifocal pneumonia despite recent antibiotic treatment.     Review of Systems: As mentioned in the history of present illness. All other systems reviewed and are negative. Past Medical History:  Diagnosis Date   Asthma    COPD (chronic obstructive pulmonary disease) (HCC)    Coronary artery disease    MI, old    Type 2 diabetes mellitus with hyperglycemia, without long-term current use of insulin  (HCC) 08/10/2022   Past Surgical History:  Procedure Laterality Date   HERNIA REPAIR     LEFT HEART CATHETERIZATION WITH CORONARY ANGIOGRAM N/A 07/17/2012   Procedure: LEFT HEART CATHETERIZATION WITH CORONARY ANGIOGRAM;  Surgeon: Erick JONELLE Bergamo, MD;  Location: [facility] CATH LAB;  Service: Cardiovascular;  Laterality: N/A;   VENTRAL HERNIA REPAIR N/A 07/09/2023   Procedure: OPEN RIGHT INGUINAL HERNIA REPAIR; DIAGNOSTIC LAPAROSCOPY;  Surgeon: Polly Cordella LABOR, MD;  Location: [facility] ORS;  Service: General;  Laterality: N/A;   Social History:  reports that he quit smoking about 4 weeks ago. His smoking use included cigarettes. He started smoking about 48 years ago. He has a 12 pack-year smoking history. He has never been exposed to tobacco smoke. He has never used smokeless tobacco. He reports that he does not currently use alcohol. He reports that he does not currently use drugs.  Allergies[1]  Family History  Problem Relation Age of Onset   Diabetes Mellitus II Mother     Prior to Admission medications  Medication Sig Start Date End Date Taking? Authorizing Provider  ACCU-CHEK GUIDE TEST test strip USE 1 STRIP TO CHECK GLUCOSE UP TO 4 TIMES DAILY 07/18/24   Sebastian Beverley NOVAK, MD  acetaminophen  (TYLENOL )  325 MG tablet Take 2 tablets (650 mg total) by mouth every 6 (six) hours as needed for mild pain (pain score 1-3) or fever. 07/13/23   Vicci Burnard SAUNDERS, PA-C  albuterol  (VENTOLIN  HFA) 108 (90 Base) MCG/ACT inhaler Inhale 1-2 puffs into the lungs every 6 (six) hours as  needed for wheezing or shortness of breath. 07/26/23 07/20/24  Sebastian Beverley NOVAK, MD  arformoterol  (BROVANA ) 15 MCG/2ML NEBU Inhale 2 mLs (15 mcg total) by nebulization 2 (two) times daily. 06/25/24   Rizwan, Saima, MD  aspirin  EC 81 MG tablet Take 1 tablet (81 mg total) by mouth daily. Swallow whole. 06/27/24   Sebastian Beverley NOVAK, MD  Blood Glucose Monitoring Suppl DEVI 1 each by Does not apply route as directed. Dispense based on patient and insurance preference. Use up to four times daily as directed. (FOR ICD-10 E10.9, E11.9). 06/27/24   Sebastian Beverley NOVAK, MD  budesonide  (PULMICORT ) 0.25 MG/2ML nebulizer solution Inhale 2 mLs (0.25 mg total) by nebulization 2 (two) times daily. 06/25/24   Rizwan, Saima, MD  budesonide -glycopyrrolate -formoterol  (BREZTRI  AEROSPHERE) 160-9-4.8 MCG/ACT AERO inhaler Inhale 2 puffs into the lungs 2 (two) times daily. 02/06/24   Billy Knee, FNP  Colchicine  0.6 MG CAPS At onset of flair, Take 1.2 mg (two tablets). Take 0.6 mg (one tablet) one hour later. Then, take 0.6 mg (one tablet) once daily for up to 7 days as needed for swelling and pain. Patient not taking: Reported on 06/23/2024 06/28/23   Sebastian Beverley NOVAK, MD  Continuous Glucose Sensor (FREESTYLE LIBRE 3 PLUS SENSOR) MISC Change sensor every 15 days. 06/27/24   Sebastian Beverley NOVAK, MD  Dextromethorphan -guaiFENesin  (ROBAFEN DM) 20-200 MG/20ML LIQD Take 5 mLs by mouth every 4 (four) hours as needed (chest congestion). 06/25/24   Rizwan, Saima, MD  Doxylamine Succinate, Sleep, (SLEEP AID PO) Take 1 tablet by mouth at bedtime as needed. Patient not taking: Reported on 06/27/2024    [provider]  empagliflozin  (JARDIANCE ) 10 MG TABS tablet Take 1 tablet by mouth once daily 06/29/24   Sebastian Beverley NOVAK, MD  glipiZIDE  (GLUCOTROL  XL) 5 MG 24 hr tablet Please take this if your fasting AM blood glucose is > 150 06/26/24   Rizwan, Saima, MD  guaiFENesin  (MUCINEX ) 600 MG 12 hr tablet Take 1 tablet (600 mg total) by mouth 2 (two) times  daily. 06/25/24   Rizwan, Saima, MD  ibuprofen  (ADVIL ) 400 MG tablet Take 1 tablet (400 mg total) by mouth every 4 (four) hours as needed for fever or headache. 06/25/24   Rizwan, Saima, MD  ipratropium-albuterol  (DUONEB) 0.5-2.5 (3) MG/3ML SOLN Inhale 3 mLs by nebulization every 6 (six) hours as needed. 06/25/24   Rizwan, Saima, MD  Lancet Device MISC 1 each by Does not apply route as directed. Dispense based on patient and insurance preference. Use up to four times daily as directed. (FOR ICD-10 E10.9, E11.9). 06/27/24   Sebastian Beverley NOVAK, MD  Lancets MISC 1 each by Does not apply route as directed. Dispense based on patient and insurance preference. Use up to four times daily as directed. (FOR ICD-10 E10.9, E11.9). 06/27/24   Sebastian Beverley NOVAK, MD  levofloxacin  (LEVAQUIN ) 750 MG tablet Take 1 tablet (750 mg total) by mouth daily. 06/26/24   Rizwan, Saima, MD  losartan  (COZAAR ) 25 MG tablet Take 0.5 tablets (12.5 mg total) by mouth daily. 02/06/24   Billy Knee, FNP  metFORMIN  (GLUCOPHAGE -XR) 750 MG 24 hr tablet Take 750 mg by mouth daily with breakfast.  [provider]  nicotine  (NICODERM CQ  - DOSED IN MG/24 HOURS) 21 mg/24hr patch Place 1 patch (21 mg total) onto the skin daily. 07/23/24   Sebastian Beverley NOVAK, MD  OXYGEN Inhale 2 L into the lungs continuous.    [provider]  rosuvastatin  (CRESTOR ) 5 MG tablet Take 1 tablet by mouth once daily 01/25/24   Sebastian Beverley NOVAK, MD  senna-docusate (SENOKOT-S) 8.6-50 MG tablet Take 1 tablet by mouth at bedtime as needed for mild constipation. 06/25/24   Rizwan, Saima, MD  tamsulosin  (FLOMAX ) 0.4 MG CAPS capsule Take 1 capsule (0.4 mg total) by mouth daily. 11/03/23   Sebastian Beverley NOVAK, MD    Physical Exam: Vitals:   07/24/24 1229 07/24/24 1230 07/24/24 1318 07/24/24 1515  BP:  108/78  (!) 159/89  Pulse:  (!) 112  78  Resp:  (!) 24  18  Temp:  98.9 F (37.2 C)  98.8 F (37.1 C)  SpO2:  93% 99% 97%  Weight: 73.5 kg     Height: 6' (1.829 m)       Physical Exam GEN: 64 yo male in NAD, alert and oriented x 3, wd/wn HEENT: NCAT, PERRL, EOMI, sclera clear, MMM PULM: CTAB w/o wheezes/crackles, normal respiratory effort, 2 L nasal cannula with SpO2 97% at rest CV: RRR w/o M/G/R GI: abd soft, NTND, + BS MSK: no peripheral edema, moves all extremities independently with preserved muscle strength NEURO: No focal neurological deficit PSYCH: normal mood/affect Integumentary: No concerning rashes/lesions/wounds noted on exposed skin surfaces   Assessment and Plan:  Multifocal pneumonia with cavitation Recent Pseudomonas pneumonia Patient presenting to ED with acute onset shortness of breath, fever/chills, sweats, cough.  Denies hemoptysis.  Recently hospitalized January 2026 with Pseudomonas pneumonia and treated initially with azithromycin  and ceftriaxone  followed by Levaquin  x 7 days.  Patient reports doing well after completing antibiotics but now with acute onset of symptom patient does work in a dentist; but denies any particular exposures to sick individuals.  Denies any recent travel, no changes in medication.  Patient is afebrile without leukocytosis.  CT angiogram chest with findings of progressive multifocal cavitary pneumonia, worse posterior right upper lobe measuring 4.2 cm.  Differential includes staph, Pseudomonas, TB, Aspergillus, histoplasma or other fungal infection -- Admit to inpatient, telemetry unit -- Infectious disease consult -- Pulmonology consult for consideration of bronchoscopy -- AFB x 3 -- Fungitell, fungus culture -- HIV -- Sputum culture; RT to induce if needed -- Vancomycin , pharmacy consulted for dosing/monitoring -- Cefepime  -- Supportive care, nebs as needed -- Airborne isolation with negative pressure room -- Repeat CBC in a.m. -- N.p.o. after midnight for potential bronchoscopy tomorrow  COPD On Breztri  at baseline, reports being out of this medication. -- Continue Breztri  -- Albuterol  neb  every 4 hours.  Wheezing/shortness of breath  DM2 On Jardiance  and metformin  at baseline.  Last hemoglobin A1c 10.5%, poorly controlled. -- Hold oral hypoglycemics while inpatient -- Sensitive SSI for coverage -- CBG before every meal/at bedtime  HLD -- Continue Crestor   BPH -- Continue tamsulosin   Tobacco use disorder Counsel need for complete cessation/abstinence     Advance Care Planning:   Code Status: Full Code   Consults: Pulmonology, infectious disease  Family Communication: Updated family present at bedside   Severity of Illness: The appropriate patient status for this patient is INPATIENT. Inpatient status is judged to be reasonable and necessary in order to provide the required intensity of service to ensure the patient's safety. The  patient's presenting symptoms, physical exam findings, and initial radiographic and laboratory data in the context of their chronic comorbidities is felt to place them at high risk for further clinical deterioration. Furthermore, it is not anticipated that the patient will be medically stable for discharge from the hospital within 2 midnights of admission.   * I certify that at the point of admission it is my clinical judgment that the patient will require inpatient hospital care spanning beyond 2 midnights from the point of admission due to high intensity of service, high risk for further deterioration and high frequency of surveillance required.*  Author: Camellia PARAS Haru Anspaugh, DO 07/24/2024 5:27 PM  For on call review [email or web address].     [1]  Allergies Allergen Reactions   Penicillins Hives    Has patient had a PCN reaction causing immediate rash, facial/tongue/throat swelling, SOB or lightheadedness with hypotension: Yes Has patient had a PCN reaction causing severe rash involving mucus membranes or skin necrosis: No Has patient had a PCN reaction that required hospitalization: No Has patient had a PCN reaction occurring within the last 10  years: No If all of the above answers are NO, then may proceed with Cephalosporin use.  Received Ancef  for surgery on 07/09/23 with no issues.   "

## 2024-07-24 NOTE — Telephone Encounter (Signed)
 FYI Only or Action Required?: FYI only for provider: ED advised.  Patient was last seen in primary care on 06/27/2024 by Sebastian Beverley NOVAK, MD.  Called Nurse Triage reporting Excessive Sweating (headache).  Symptoms began yesterday.  Interventions attempted: Nothing.  Symptoms are: gradually worsening.  Triage Disposition: Go to ED Now (or PCP Triage)  Patient/caregiver understands and will follow disposition?: Yes

## 2024-07-25 DIAGNOSIS — J449 Chronic obstructive pulmonary disease, unspecified: Secondary | ICD-10-CM | POA: Diagnosis not present

## 2024-07-25 DIAGNOSIS — J151 Pneumonia due to Pseudomonas: Secondary | ICD-10-CM

## 2024-07-25 DIAGNOSIS — J188 Other pneumonia, unspecified organism: Secondary | ICD-10-CM | POA: Diagnosis not present

## 2024-07-25 DIAGNOSIS — F17210 Nicotine dependence, cigarettes, uncomplicated: Secondary | ICD-10-CM

## 2024-07-25 LAB — GLUCOSE, CAPILLARY
Glucose-Capillary: 165 mg/dL — ABNORMAL HIGH (ref 70–99)
Glucose-Capillary: 124 mg/dL — ABNORMAL HIGH (ref 70–99)
Glucose-Capillary: 251 mg/dL — ABNORMAL HIGH (ref 70–99)
Glucose-Capillary: 169 mg/dL — ABNORMAL HIGH (ref 70–99)

## 2024-07-25 LAB — CRYPTOCOCCAL ANTIGEN: Crypto Ag: NEGATIVE

## 2024-07-25 LAB — COMPREHENSIVE METABOLIC PANEL WITH GFR
ALT: 46 U/L — ABNORMAL HIGH (ref 0–44)
AST: 32 U/L (ref 15–41)
Albumin: 3.6 g/dL (ref 3.5–5.0)
Alkaline Phosphatase: 113 U/L (ref 38–126)
Anion gap: 13 (ref 5–15)
BUN: 19 mg/dL (ref 8–23)
CO2: 23 mmol/L (ref 22–32)
Calcium: 8.9 mg/dL (ref 8.9–10.3)
Chloride: 99 mmol/L (ref 98–111)
Creatinine, Ser: 0.83 mg/dL (ref 0.61–1.24)
GFR, Estimated: >60 mL/min
Glucose, Bld: 244 mg/dL — ABNORMAL HIGH (ref 70–99)
Potassium: 4.2 mmol/L (ref 3.5–5.1)
Sodium: 135 mmol/L (ref 135–145)
Total Bilirubin: 0.2 mg/dL (ref 0.0–1.2)
Total Protein: 7.6 g/dL (ref 6.5–8.1)

## 2024-07-25 LAB — CBC
HCT: 40.2 % (ref 39.0–52.0)
Hemoglobin: 13.6 g/dL (ref 13.0–17.0)
MCH: 32.2 pg (ref 26.0–34.0)
MCHC: 33.8 g/dL (ref 30.0–36.0)
MCV: 95 fL (ref 80.0–100.0)
Platelets: 321 10*3/uL (ref 150–400)
RBC: 4.23 MIL/uL (ref 4.22–5.81)
RDW: 14.2 % (ref 11.5–15.5)
WBC: 4.9 10*3/uL (ref 4.0–10.5)
nRBC: 0 % (ref 0.0–0.2)

## 2024-07-25 LAB — EXPECTORATED SPUTUM ASSESSMENT W GRAM STAIN, RFLX TO RESP C

## 2024-07-25 LAB — MRSA NEXT GEN BY PCR, NASAL: MRSA by PCR Next Gen: NOT DETECTED

## 2024-07-25 LAB — HIV ANTIBODY (ROUTINE TESTING W REFLEX): HIV Screen 4th Generation wRfx: NONREACTIVE

## 2024-07-25 LAB — HEMOGLOBIN A1C
Hgb A1c MFr Bld: 8.4 % — ABNORMAL HIGH (ref 4.8–5.6)
Mean Plasma Glucose: 194.38 mg/dL

## 2024-07-25 LAB — STREP PNEUMONIAE URINARY ANTIGEN: Strep Pneumo Urinary Antigen: NEGATIVE

## 2024-07-25 NOTE — Progress Notes (Signed)
 "        Sean Martinez                                                                               Sean Martinez, is a 64 y.o. male, DOB - [birth year], FMW:[id number] Admit date - 07/24/2024    Outpatient Primary MD for the patient is Sean Beverley NOVAK, MD  LOS - 1  days    Brief summary   Sean Martinez is a 64 y.o. male with medical history significant of COPD, DM2, HTN, HLD, BPH, tobacco use disorder who presented to [facility] ED on 07/24/2024 with shortness of breath, cough, fever/chills/sweats while at work today.  Acute onset.  Recently hospitalized 1/2 - 1/6 for Pseudomonas pneumonia, initially treated with azithromycin  and ceftriaxone  and discharged on 7-day course of Levaquin .  Patient reports he has been doing well since completing his antibiotics until today.  He works at a [employer], does not contribute to any known sick contacts although.  Denies any recent travel, no changes in the medication. CT angiogram chest negative for pulmonary embolism, extensive multifocal airspace consolidation involving all lobes largest 4.2 cm posterior right upper lobe with central cavitation.  Patient was started on vancomycin , cefepime .  Received IV Solu-Medrol , magnesium , albuterol  neb.  TRH consulted for admission for further evaluation and management of multifocal pneumonia despite recent antibiotic treatment.    Assessment & Plan    Assessment and Plan:  Multifocal pneumonia with cavitation, recent pseudomonas pneumonia in the setting of COPD Completed the course of levaquin .  ID consulted.  AFB , sputum cultures, legionella antigen pending.  ID on board and appreciate recommendations.    H/o COPD/ chronic respiratory failure on 2 lit of  oxygen.  Continue with bronchodilators.     Estimated body mass index is 22.81 kg/m as calculated from the following:   Height as of this encounter: 6' (1.829 m).   Weight as of this encounter: 76.3 kg.  Code Status: full  code.  DVT Prophylaxis:  scd's   Level of Care: Level of care: Telemetry Family Communication: none at bedside.   Disposition Plan:     Remains inpatient appropriate:  pending.    Procedures:  None.   Consultants:   ID Pulmonology.   Antimicrobials:   Anti-infectives (From admission, onward)    Start     Dose/Rate Route Frequency Ordered Stop   07/25/24 0600  vancomycin  (VANCOREADY) IVPB 1250 mg/250 mL  Status:  Discontinued        1,250 mg 166.7 mL/hr over 90 Minutes Intravenous Every 12 hours 07/24/24 1814 07/25/24 1034   07/24/24 2300  ceFEPIme  (MAXIPIME ) 2 g in sodium chloride  0.9 % 100 mL IVPB        2 g 200 mL/hr over 30 Minutes Intravenous Every 8 hours 07/24/24 1734     07/24/24 1700  ceFEPIme  (MAXIPIME ) 2 g in sodium chloride  0.9 % 100 mL IVPB        2 g 200 mL/hr over 30 Minutes Intravenous  Once 07/24/24 1647 07/24/24 1749   07/24/24 1645  aztreonam (AZACTAM) 2 g in sodium chloride  0.9 % 100 mL IVPB  Status:  Discontinued  2 g 200 mL/hr over 30 Minutes Intravenous  Once 07/24/24 1635 07/24/24 1645   07/24/24 1645  vancomycin  (VANCOCIN ) IVPB 1000 mg/200 mL premix  Status:  Discontinued        1,000 mg 200 mL/hr over 60 Minutes Intravenous  Once 07/24/24 1635 07/24/24 1644   07/24/24 1645  vancomycin  (VANCOREADY) IVPB 1500 mg/300 mL        1,500 mg 150 mL/hr over 120 Minutes Intravenous  Once 07/24/24 1645 07/24/24 1900        Medications  Scheduled Meds:  budesonide -glycopyrrolate -formoterol   2 puff Inhalation BID   insulin  aspart  0-15 Units Subcutaneous TID WC   insulin  aspart  0-5 Units Subcutaneous QHS   losartan   12.5 mg Oral Daily   nicotine   21 mg Transdermal Daily   rosuvastatin   5 mg Oral Daily   tamsulosin   0.4 mg Oral Daily   Continuous Infusions:  ceFEPime  (MAXIPIME ) IV 200 mL/hr at 07/25/24 1508   PRN Meds:.acetaminophen  **OR** acetaminophen , albuterol , melatonin, ondansetron  **OR** ondansetron  (ZOFRAN ) IV, oxyCODONE , polyethylene  glycol, sodium chloride  HYPERTONIC    Subjective:   Sean Martinez was seen and examined today.  Feeling better,no chest pain. Some dry cough.   Objective:   Vitals:   07/25/24 0421 07/25/24 0745 07/25/24 0748 07/25/24 1405  BP: 113/72 128/85  133/83  Pulse: 91 84  (!) 105  Resp: 16 16  16   Temp: 98.9 F (37.2 C)     TempSrc:      SpO2: 95% 95% 95% 99%  Weight:      Height:        Intake/Output Summary (Last 24 hours) at 07/25/2024 1635 Last data filed at 07/25/2024 1508 Gross per 24 hour  Intake 936.44 ml  Output --  Net 936.44 ml   Filed Weights   07/24/24 1229 07/24/24 1955  Weight: 73.5 kg 76.3 kg     Exam General: Alert and oriented x 3, NAD Cardiovascular: S1 S2 auscultated, no murmurs, RRR Respiratory: Clear to auscultation bilaterally, no wheezing, rales or rhonchi Gastrointestinal: Soft, nontender, nondistended, + bowel sounds Ext: no pedal edema bilaterally Neuro: AAOx3,    Data Reviewed:  I have personally reviewed following labs and imaging studies   CBC Lab Results  Component Value Date   WBC 4.9 07/25/2024   RBC 4.23 07/25/2024   HGB 13.6 07/25/2024   HCT 40.2 07/25/2024   MCV 95.0 07/25/2024   MCH 32.2 07/25/2024   PLT 321 07/25/2024   MCHC 33.8 07/25/2024   RDW 14.2 07/25/2024   LYMPHSABS 1.5 04/19/2024   MONOABS 0.4 04/19/2024   EOSABS 0.0 04/19/2024   BASOSABS 0.0 04/19/2024     Last metabolic panel Lab Results  Component Value Date   NA 135 07/25/2024   K 4.2 07/25/2024   CL 99 07/25/2024   CO2 23 07/25/2024   BUN 19 07/25/2024   CREATININE 0.83 07/25/2024   GLUCOSE 244 (H) 07/25/2024   GFRNONAA >60 07/25/2024   GFRAA >60 04/12/2019   CALCIUM  8.9 07/25/2024   PROT 7.6 07/25/2024   ALBUMIN  3.6 07/25/2024   BILITOT 0.2 07/25/2024   ALKPHOS 113 07/25/2024   AST 32 07/25/2024   ALT 46 (H) 07/25/2024   ANIONGAP 13 07/25/2024    CBG (last 3)  Recent Labs    07/25/24 0745 07/25/24 1149 07/25/24 1627  GLUCAP 165* 124*  251*      Coagulation Profile: No results for input(s): INR, PROTIME in the last 168 hours.   Radiology Studies:  CT Angio Chest PE W/Cm &/Or Wo Cm Result Date: 07/24/2024 EXAM: CTA CHEST 07/24/2024 02:22:27 PM TECHNIQUE: CTA of the chest was performed after the administration of intravenous contrast. Multiplanar reformatted images are provided for review. MIP images are provided for review. Automated exposure control, iterative reconstruction, and/or weight based adjustment of the mA/kV was utilized to reduce the radiation dose to as low as reasonably achievable. COMPARISON: 06/22/2024 CLINICAL HISTORY: Pulmonary embolism (PE) suspected, high prob. Suspected pulmonary embolism (PE), high probability. FINDINGS: PULMONARY ARTERIES: Pulmonary arteries are adequately opacified for evaluation. No acute pulmonary embolus. Main pulmonary artery is normal in caliber. MEDIASTINUM: The heart demonstrates scattered right coronary calcifications. Trace pericardial effusion. There is no acute abnormality of the thoracic aorta. LYMPH NODES: No mediastinal, hilar or axillary lymphadenopathy. LUNGS AND PLEURA: The right juxtahilar and right middle lobe airspace opacities seen previously have largely resolved. However, there are more extensive new multifocal regions of airspace consolidation involving all lobes, largest 4.2 cm in the posterior right upper lobe, with some central cavitation. No pulmonary edema. No evidence of pleural effusion or pneumothorax. UPPER ABDOMEN: Few left sided colonic diverticula incidentally noted. IMPRESSION: 1. No pulmonary embolism. 2. Extensive multifocal airspace consolidation involving all lobes, largest 4.2 cm in the posterior right upper lobe, with central cavitation. Electronically signed by: Dayne Hassell MD 07/24/2024 02:51 PM EST RP Workstation: [id number]   DG Chest 2 View Result Date: 07/24/2024 EXAM: 2 VIEW(S) XRAY OF THE CHEST 07/24/2024 01:28:09 PM COMPARISON: 06/22/2024  CLINICAL HISTORY: Chest pain. FINDINGS: LUNGS AND PLEURA: Hazy airspace opacities in the right upper lung zone. The lateral right upper lung zone, this appears to be possibly cavitary with a lesion measuring 4.5 cm. Emphysema. Nodular opacity in the left upper lobe measuring 2 cm. No pleural effusion. No pneumothorax. HEART AND MEDIASTINUM: No acute abnormality of the cardiac and mediastinal silhouettes. BONES AND SOFT TISSUES: Multilevel degenerative disc disease of the spine. IMPRESSION: 1. Hazy airspace opacities in the right upper lung zone with possible cavitation, measuring 4.5 cm, findings possibly reflecting a cavitary bronchopneumonia, which includes tuberculosis. Appropriate exposure precautions recommended. 2. Nodular opacity in the left upper lobe measuring 2 cm, suggesting new disease in the left upper lobe Electronically signed by: Rogelia Myers MD 07/24/2024 01:46 PM EST RP Workstation: [id number]       Elgie Butter M.D. Sean Martinez 07/25/2024, 4:35 PM  Available via Epic secure chat 7am-7pm After 7 pm, please refer to night coverage provider listed on amion.    "

## 2024-07-25 NOTE — Consult Note (Addendum)
 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  Can start off certainly trying to get AFB so that he can expectorate himself but if he cannot would get respiratory therapy to do hypertonic saline and ductions and if these are unsuccessful document that they have been unsuccessful. I do think you will ultimately need a bronchoscopy for deep specimen for AFB stain and culture as well as fungal stain and cultures and other cultures from within the lungs Continue airborne precautions DC vancomycin  and continue cefepime  I will send off a serum cryptococcal antigen urine histo and Blastomyces antigen those these are unlikely to be high yield here     HPI: Sean Martinez is a 64 year old man who previously quit smoking and has been clean from all other drugs for some period of time but took it back smoking although it lower amounts with history also of COPD who had been hospitalized in early January for a Pseudomonas pneumonia and was discharged with 7 days of levofloxacin .  Of note early in January he did have a CT of the chest performed on June 22, 2024 which showed right perihilar upper lobe peribronchiolar consolidation with possible right upper lobe cavitation 1.3 x 1.1 cm with patchy consolidative airspace Passey of the right upper lobe and to lesser extent the right middle lobe with some prominent on the left but not largely not enlarged mediastinal lymph nodes and some emphysema.  He was at work when his coworkers noticed him sweating profusely  in the last 2 days and this worsened and ultimately sought care at [facility] in the ER on 4 February.  He was having worsening shortness of breath and cough and measurable fever on arrival he was requiring 2 L via nasal cannula to sat in the 93.3% range COVID flu and RSV were negative chest x-ray with some hazy opacities in the right upper lobe with some possible cavitation considered now 4.5 cm with some nodular opacity in left upper lobe 2 cm CTA was performed and this did not show any pulmonary embolism but did show extensive multifocal airspace consolidation involving all lobes with the largest being 4.2 cm posterior right upper lobe with central cavitation.  The patient is admitted being worked up for potential tuberculosis he does not have a travel history outside the country is not been incarcerated in jail and he is HIV negative he has not been around anyone known to have tuberculosis.  That being said we have been seeing tuberculosis in people who seem to have 0 risk factors for acquisition I think it definitely needs to be excluded.  It could be that his cavitary pathology is related to his pseudomonal infection as well.  I definitely would get sputum for AFB cultures and I still would like to get a bronchoscopy to get a deep specimen within the lungs particular look for other things such as fungal infections and nocardia which could be hard to diagnose on sputum and to get a  nice deep specimen from bronchoscopy for AFB.  If he cannot produce sputum sufficiently on his own would do hypertonic saline to induce sputum for AFB stain and culture.  If such attempts do not produce sputum that can be analyzed if they are documented they will count is negative AFBs is AFBs need to be collected at least 8 hours apart for 3 specimens 1 specimen can be from the bronchoscopy if he has 1  Given negative MRSA PCR swab will DC vancomycin  and continue cefepime     I personally spent a total of 82  minutes in the care of the patient today including preparing to see the patient, getting/reviewing separately obtained history, performing a medically appropriate exam/evaluation, counseling and educating, placing orders, referring and communicating with other health care professionals, documenting clinical information in the EHR, independently interpreting results, communicating results, and coordinating care.   Evaluation of the patient requires complex antimicrobial therapy evaluation, counseling , isolation needs to reduce disease transmission and risk assessment and mitigation.     Review of Systems: Review of Systems  Constitutional:  Positive for chills, fever and malaise/fatigue. Negative for weight loss.  HENT:  Negative for congestion and sore throat.   Eyes:  Negative for blurred vision and photophobia.  Respiratory:  Positive for cough. Negative for shortness of breath and wheezing.   Cardiovascular:  Negative for chest pain, palpitations and leg swelling.  Gastrointestinal:  Negative for abdominal pain, blood in stool, constipation, diarrhea, heartburn, melena, nausea and vomiting.  Genitourinary:  Negative for dysuria, flank pain and hematuria.  Musculoskeletal:  Negative for back pain, falls, joint pain and myalgias.  Skin:  Negative for itching and rash.  Neurological:  Negative for dizziness, focal weakness, loss of consciousness, weakness and headaches.  Endo/Heme/Allergies:  Does not bruise/bleed easily.  Psychiatric/Behavioral:  Negative for depression and suicidal ideas. The patient does not have insomnia.     Past Medical History:  Diagnosis Date   Asthma    COPD (chronic obstructive pulmonary disease) (HCC)    Coronary artery disease    MI, old    Type 2 diabetes mellitus with hyperglycemia, without long-term current use of insulin  (HCC) 08/10/2022    Social History[1]  Family History  Problem Relation Age of Onset   Diabetes Mellitus II Mother     Allergies[2]  OBJECTIVE: Blood pressure 128/85, pulse 84, temperature 98.9 F (37.2 C), resp. rate 16, height 6' (1.829 m), weight 76.3 kg, SpO2 95%.  Physical Exam Constitutional:      Appearance: He is well-developed.  HENT:     Head: Normocephalic and atraumatic.  Eyes:     Conjunctiva/sclera: Conjunctivae normal.  Cardiovascular:     Rate and Rhythm: Normal rate and regular rhythm.     Heart sounds: No murmur heard.    No friction rub. No gallop.  Pulmonary:     Effort: Pulmonary effort is normal. No respiratory distress.     Breath sounds: No stridor. No wheezing or rhonchi.  Abdominal:     General: There is no distension.     Palpations: Abdomen is soft.  Musculoskeletal:        General: No tenderness. Normal range of motion.     Cervical back: Normal range of motion and neck supple.  Skin:    General: Skin is warm and dry.     Coloration: Skin is not pale.     Findings: No erythema or rash.  Neurological:     General: No focal deficit present.  Mental Status: He is alert and oriented to person, place, and time.  Psychiatric:        Mood and Affect: Mood normal.        Behavior: Behavior normal.        Thought Content: Thought content normal.        Judgment: Judgment normal.     Lab Results Lab Results  Component Value Date   WBC 4.9 07/25/2024   HGB 13.6 07/25/2024   HCT 40.2 07/25/2024   MCV 95.0 07/25/2024   PLT 321 07/25/2024    Lab Results  Component Value Date   CREATININE 0.83 07/25/2024   BUN 19 07/25/2024   NA 135 07/25/2024   K 4.2 07/25/2024   CL 99 07/25/2024   CO2 23 07/25/2024    Lab Results  Component Value Date   ALT 46 (H) 07/25/2024   AST 32 07/25/2024   ALKPHOS 113 07/25/2024   BILITOT 0.2 07/25/2024     Microbiology: Recent Results (from the past 240 hours)  Resp panel by RT-PCR (RSV, Flu A&B, Covid) Anterior Nasal Swab     Status: None   Collection Time: 07/24/24  3:34 PM   Specimen: Anterior Nasal Swab  Result  Value Ref Range Status   SARS Coronavirus 2 by RT PCR NEGATIVE NEGATIVE Final    Comment: (NOTE) SARS-CoV-2 target nucleic acids are NOT DETECTED.  The SARS-CoV-2 RNA is generally detectable in upper respiratory specimens during the acute phase of infection. The lowest concentration of SARS-CoV-2 viral copies this assay can detect is 138 copies/mL. A negative result does not preclude SARS-Cov-2 infection and should not be used as the sole basis for treatment or other patient management decisions. A negative result may occur with  improper specimen collection/handling, submission of specimen other than nasopharyngeal swab, presence of viral mutation(s) within the areas targeted by this assay, and inadequate number of viral copies(<138 copies/mL). A negative result must be combined with clinical observations, patient history, and epidemiological information. The expected result is Negative.  Fact Sheet for Patients:  bloggercourse.com  Fact Sheet for Healthcare Providers:  seriousbroker.it  This test is no t yet approved or cleared by the [place]  FDA and  has been authorized for detection and/or diagnosis of SARS-CoV-2 by FDA under an Emergency Use Authorization (EUA). This EUA will remain  in effect (meaning this test can be used) for the duration of the COVID-19 declaration under Section 564(b)(1) of the Act, 21 U.S.C.section 360bbb-3(b)(1), unless the authorization is terminated  or revoked sooner.       Influenza A by PCR NEGATIVE NEGATIVE Final   Influenza B by PCR NEGATIVE NEGATIVE Final    Comment: (NOTE) The Xpert Xpress SARS-CoV-2/FLU/RSV plus assay is intended as an aid in the diagnosis of influenza from Nasopharyngeal swab specimens and should not be used as a sole basis for treatment. Nasal washings and aspirates are unacceptable for Xpert Xpress SARS-CoV-2/FLU/RSV testing.  Fact Sheet for  Patients: bloggercourse.com  Fact Sheet for Healthcare Providers: seriousbroker.it  This test is not yet approved or cleared by the [place]  FDA and has been authorized for detection and/or diagnosis of SARS-CoV-2 by FDA under an Emergency Use Authorization (EUA). This EUA will remain in effect (meaning this test can be used) for the duration of the COVID-19 declaration under Section 564(b)(1) of the Act, 21 U.S.C. section 360bbb-3(b)(1), unless the authorization is terminated or revoked.     Resp Syncytial Virus by PCR NEGATIVE NEGATIVE Final  Comment: (NOTE) Fact Sheet for Patients: bloggercourse.com  Fact Sheet for Healthcare Providers: seriousbroker.it  This test is not yet approved or cleared by the [place]  FDA and has been authorized for detection and/or diagnosis of SARS-CoV-2 by FDA under an Emergency Use Authorization (EUA). This EUA will remain in effect (meaning this test can be used) for the duration of the COVID-19 declaration under Section 564(b)(1) of the Act, 21 U.S.C. section 360bbb-3(b)(1), unless the authorization is terminated or revoked.  Performed at [facility], [address], [place], [place] [address]   Culture, blood (routine x 2)     Status: None (Preliminary result)   Collection Time: 07/24/24  5:15 PM   Specimen: BLOOD RIGHT WRIST  Result Value Ref Range Status   Specimen Description   Final    BLOOD RIGHT WRIST Performed at [facility], [address] [place], [place] [address]    Special Requests   Final    BOTTLES DRAWN AEROBIC AND ANAEROBIC Blood Culture adequate volume Performed at [facility], [address] [place], [place] [address]    Culture   Final    NO GROWTH < 12 HOURS Performed at [facility], [address], [place], [place] [address]    Report Status  PENDING  Incomplete  MRSA Next Gen by PCR, Nasal     Status: None   Collection Time: 07/25/24  1:00 AM   Specimen: Nasal Mucosa; Nasal Swab  Result Value Ref Range Status   MRSA by PCR Next Gen NOT DETECTED NOT DETECTED Final    Comment: (NOTE) The GeneXpert MRSA Assay (FDA approved for NASAL specimens only), is one component of a comprehensive MRSA colonization surveillance program. It is not intended to diagnose MRSA infection nor to guide or monitor treatment for MRSA infections. Test performance is not FDA approved in patients less than 51 years old. Performed at [facility], [address], [place], [place] [address]     Jomarie Fleeta Rothman, MD [facility] [phone] pager  07/25/2024, 11:57 AM      [1]  Social History Tobacco Use   Smoking status: Former    Current packs/day: 0.00    Average packs/day: 0.3 packs/day for 48.0 years (12.0 ttl pk-yrs)    Types: Cigarettes    Start date: 48    Quit date: 06/20/2024    Years since quitting: 0.0    Passive exposure: Never   Smokeless tobacco: Never  Vaping Use   Vaping status: Never Used  Substance Use Topics   Alcohol use: Not Currently   Drug use: Not Currently  [2]  Allergies Allergen Reactions   Penicillins Hives and Other (See Comments)    Has patient had a PCN reaction causing immediate rash, facial/tongue/throat swelling, SOB or lightheadedness with hypotension: Yes  Received Ancef  for surgery on 07/09/23 with no issues.   "

## 2024-07-25 NOTE — Consult Note (Signed)
 "  NAME:  Sean Martinez, MRN:  [id number], DOB:  1961-04-18, LOS: 1 ADMISSION DATE:  07/24/2024, CONSULTATION DATE:  2/4 REFERRING MD:  Dr. Cherlyn, CHIEF COMPLAINT:  cavitary pna   History of Present Illness:  Patient is a 64 yo M w/ pertinent PMH COPD, tobacco use presents to [facility] on 2/3 w/ acute onset sob, cough, fever.  Patient recently hospitalized on 1/2-1/6 w/ pseudomonas pna discharged w/ 7 days course of levaquin . Came to [facility] on 2/4 w/ acute onset sob, cough, fever. On arrival no fever sats 93% on 2L [place]. Wbc 4.8. covid/flu/rsv negative. CXR w/ hazy opacities RUL w/ possible cavitation 4.5 cm concerning for caritary bronchopneumonia; possible tb; nodular opacity in LUL 2 cm. CTA chest mulifocal consolidation all lobes largest 4.2 cm posterio RUL central cavitation. Patient started on cefepime /vanc. Given steroids, mag, nebs for sob. PCCM consulted.  Pertinent  Medical History   Past Medical History:  Diagnosis Date   Asthma    COPD (chronic obstructive pulmonary disease) (HCC)    Coronary artery disease    MI, old    Type 2 diabetes mellitus with hyperglycemia, without long-term current use of insulin  (HCC) 08/10/2022     Significant Hospital Events: Including procedures, antibiotic start and stop dates in addition to other pertinent events   2/3 admit w/ cavitary pna  Interim History / Subjective:  See above  Objective    Blood pressure 128/85, pulse 84, temperature 98.9 F (37.2 C), resp. rate 16, height 6' (1.829 m), weight 76.3 kg, SpO2 95%.        Intake/Output Summary (Last 24 hours) at 07/25/2024 0815 Last data filed at 07/25/2024 0405 Gross per 24 hour  Intake 550 ml  Output --  Net 550 ml   Filed Weights   07/24/24 1229 07/24/24 1955  Weight: 73.5 kg 76.3 kg    Examination: General: NAD HEENT: MM pink/moist; [place] in place Neuro: Aox3; MAE CV: s1s2, no m/r/g PULM:  dim clear BS bilaterally; [place]  GI: soft, bsx4 active  Extremities: warm/dry, no edema   Skin: no rashes or lesions    Resolved problem list   Assessment and Plan   Multifocal pneumonia w/ cavitation -recent pseudomonas pneumonia completed course of levaquin  -strep pneumoniae negative; hiv non reactive Plan: -ID consulted by trh; currently on cefepime /vanc -mrsa pcr negative consider dc'ing vanc -currently on airborne precautions -AFB x3 ordered -exp sputum and cultures pending -fungal culture ordered -legionella pending -pulm toiletry -patient able to cough up sputum sample. Will hold off on bronch for now.  Hx of COPD Plan: -on 2L  at home -cont breztri  -alb nebs prn  Labs   CBC: Recent Labs  Lab 07/24/24 1235 07/25/24 0034  WBC 4.8 4.9  HGB 14.5 13.6  HCT 43.5 40.2  MCV 96.0 95.0  PLT 310 321    Basic Metabolic Panel: Recent Labs  Lab 07/24/24 1235 07/25/24 0034  NA 134* 135  K 4.4 4.2  CL 97* 99  CO2 26 23  GLUCOSE 176* 244*  BUN 15 19  CREATININE 0.74 0.83  CALCIUM  9.7 8.9   GFR: Estimated Creatinine Clearance: 98.3 mL/min (by C-G formula based on SCr of 0.83 mg/dL). Recent Labs  Lab 07/24/24 1235 07/24/24 1722 07/25/24 0034  WBC 4.8  --  4.9  LATICACIDVEN  --  1.2  --     Liver Function Tests: Recent Labs  Lab 07/25/24 0034  AST 32  ALT 46*  ALKPHOS 113  BILITOT 0.2  PROT  7.6  ALBUMIN  3.6   No results for input(s): LIPASE, AMYLASE in the last 168 hours. No results for input(s): AMMONIA in the last 168 hours.  ABG    Component Value Date/Time   TCO2 22 05/26/2013 0148     Coagulation Profile: No results for input(s): INR, PROTIME in the last 168 hours.  Cardiac Enzymes: No results for input(s): CKTOTAL, CKMB, CKMBINDEX, TROPONINI in the last 168 hours.  HbA1C: Hemoglobin A1C  Date/Time Value Ref Range Status  04/30/2024 04:36 PM 10.5 (A) 4.0 - 5.6 % Final   HbA1c, POC (prediabetic range)  Date/Time Value Ref Range Status  04/30/2024 04:36 PM 10.5 (A) 5.7 - 6.4 % Final   HbA1c,  POC (controlled diabetic range)  Date/Time Value Ref Range Status  04/30/2024 04:36 PM 10.5 (A) 0.0 - 7.0 % Final   HbA1c POC (<> result, manual entry)  Date/Time Value Ref Range Status  04/30/2024 04:36 PM 10.5 4.0 - 5.6 % Final   Hgb A1c MFr Bld  Date/Time Value Ref Range Status  07/25/2024 12:34 AM 8.4 (H) 4.8 - 5.6 % Final    Comment:    (NOTE) Diagnosis of Diabetes The following HbA1c ranges recommended by the American Diabetes Association (ADA) may be used as an aid in the diagnosis of diabetes mellitus.  Hemoglobin             Suggested A1C NGSP%              Diagnosis  <5.7                   Non Diabetic  5.7-6.4                Pre-Diabetic  >6.4                   Diabetic  <7.0                   Glycemic control for                       adults with diabetes.    02/06/2024 04:21 PM 8.2 (H) 4.6 - 6.5 % Final    Comment:    Glycemic Control Guidelines for People with Diabetes:Non Diabetic:  <6%Goal of Therapy: <7%Additional Action Suggested:  >8%     CBG: Recent Labs  Lab 07/24/24 2100 07/24/24 2355 07/25/24 0745  GLUCAP 443* 244* 165*    Review of Systems:   Review of Systems  Constitutional:  Positive for fever.  Respiratory:  Positive for cough, sputum production and shortness of breath.   Cardiovascular:  Negative for chest pain.  Gastrointestinal:  Negative for abdominal pain, nausea and vomiting.     Past Medical History:  He,  has a past medical history of Asthma, COPD (chronic obstructive pulmonary disease) (HCC), Coronary artery disease, MI, old, and Type 2 diabetes mellitus with hyperglycemia, without long-term current use of insulin  (HCC) (08/10/2022).   Surgical History:   Past Surgical History:  Procedure Laterality Date   HERNIA REPAIR     LEFT HEART CATHETERIZATION WITH CORONARY ANGIOGRAM N/A 07/17/2012   Procedure: LEFT HEART CATHETERIZATION WITH CORONARY ANGIOGRAM;  Surgeon: Erick JONELLE Bergamo, MD;  Location: [facility] CATH LAB;  Service:  Cardiovascular;  Laterality: N/A;   VENTRAL HERNIA REPAIR N/A 07/09/2023   Procedure: OPEN RIGHT INGUINAL HERNIA REPAIR; DIAGNOSTIC LAPAROSCOPY;  Surgeon: Polly Cordella LABOR, MD;  Location: [facility] ORS;  Service: General;  Laterality: N/A;  Social History:   reports that he quit smoking about 5 weeks ago. His smoking use included cigarettes. He started smoking about 48 years ago. He has a 12 pack-year smoking history. He has never been exposed to tobacco smoke. He has never used smokeless tobacco. He reports that he does not currently use alcohol. He reports that he does not currently use drugs.   Family History:  His family history includes Diabetes Mellitus II in his mother.   Allergies Allergies[1]   Home Medications  Prior to Admission medications  Medication Sig Start Date End Date Taking? Authorizing Provider  albuterol  (VENTOLIN  HFA) 108 (90 Base) MCG/ACT inhaler Inhale 2 puffs into the lungs every 6 (six) hours as needed for wheezing or shortness of breath.   Yes [provider]  arformoterol  (BROVANA ) 15 MCG/2ML NEBU Inhale 2 mLs (15 mcg total) by nebulization 2 (two) times daily. Patient taking differently: Take 15 mcg by nebulization 2 (two) times daily as needed (for respiratory flares/shortness of breath or wheezing). 06/25/24  Yes Rizwan, Saima, MD  budesonide  (PULMICORT ) 0.25 MG/2ML nebulizer solution Inhale 2 mLs (0.25 mg total) by nebulization 2 (two) times daily. Patient taking differently: Take 0.25 mg by nebulization 2 (two) times daily as needed (for respiratory flares/shortness of breath or wheezing). 06/25/24  Yes Rizwan, Saima, MD  budesonide -glycopyrrolate -formoterol  (BREZTRI  AEROSPHERE) 160-9-4.8 MCG/ACT AERO inhaler Inhale 2 puffs into the lungs 2 (two) times daily. Patient taking differently: Inhale 2 puffs into the lungs See admin instructions. Inhale 2 puffs into the lungs four to five times a day 02/06/24  Yes Billy Knee, FNP  empagliflozin  (JARDIANCE ) 10 MG  TABS tablet Take 1 tablet by mouth once daily 06/29/24  Yes Sebastian Beverley NOVAK, MD  glipiZIDE  (GLUCOTROL  XL) 5 MG 24 hr tablet Please take this if your fasting AM blood glucose is > 150 Patient taking differently: Take 5 mg by mouth daily with breakfast. 06/26/24  Yes Rizwan, Saima, MD  guaiFENesin  (MUCINEX ) 600 MG 12 hr tablet Take 1 tablet (600 mg total) by mouth 2 (two) times daily. 06/25/24  Yes Rizwan, Saima, MD  ipratropium-albuterol  (DUONEB) 0.5-2.5 (3) MG/3ML SOLN Inhale 3 mLs by nebulization every 6 (six) hours as needed. Patient taking differently: Take 3 mLs by nebulization every 6 (six) hours as needed (for shortness of breath or wheezing). 06/25/24  Yes Rizwan, Saima, MD  losartan  (COZAAR ) 25 MG tablet Take 0.5 tablets (12.5 mg total) by mouth daily. 02/06/24  Yes Billy Knee, FNP  metFORMIN  (GLUCOPHAGE -XR) 750 MG 24 hr tablet Take 750 mg by mouth at bedtime.   Yes [provider]  nicotine  (NICODERM CQ  - DOSED IN MG/24 HOURS) 21 mg/24hr patch Place 1 patch (21 mg total) onto the skin daily. 07/23/24  Yes Sebastian Beverley NOVAK, MD  OXYGEN Inhale 2 L into the lungs continuous. Patient taking differently: Inhale 2-3 L/min into the lungs continuous.   Yes [provider]  TYLENOL  500 MG tablet Take 500-1,000 mg by mouth every 6 (six) hours as needed (for headaches).   Yes [provider]  ACCU-CHEK GUIDE TEST test strip USE 1 STRIP TO CHECK GLUCOSE UP TO 4 TIMES DAILY 07/18/24   Sebastian Beverley NOVAK, MD  acetaminophen  (TYLENOL ) 325 MG tablet Take 2 tablets (650 mg total) by mouth every 6 (six) hours as needed for mild pain (pain score 1-3) or fever. Patient not taking: Reported on 07/24/2024 07/13/23   Vicci Burnard SAUNDERS, PA-C  albuterol  (VENTOLIN  HFA) 108 [phone] Base) MCG/ACT inhaler Inhale 1-2  puffs into the lungs every 6 (six) hours as needed for wheezing or shortness of breath. Patient not taking: Reported on 07/24/2024 07/26/23 07/20/24  Sebastian Beverley NOVAK, MD  aspirin  EC 81 MG tablet Take  1 tablet (81 mg total) by mouth daily. Swallow whole. Patient not taking: Reported on 07/24/2024 06/27/24   Sebastian Beverley NOVAK, MD  Blood Glucose Monitoring Suppl DEVI 1 each by Does not apply route as directed. Dispense based on patient and insurance preference. Use up to four times daily as directed. (FOR ICD-10 E10.9, E11.9). 06/27/24   Sebastian Beverley NOVAK, MD  Colchicine  0.6 MG CAPS At onset of flair, Take 1.2 mg (two tablets). Take 0.6 mg (one tablet) one hour later. Then, take 0.6 mg (one tablet) once daily for up to 7 days as needed for swelling and pain. Patient not taking: Reported on 07/24/2024 06/28/23   Sebastian Beverley NOVAK, MD  Continuous Glucose Sensor (FREESTYLE LIBRE 3 PLUS SENSOR) MISC Change sensor every 15 days. Patient not taking: Reported on 07/24/2024 06/27/24   Sebastian Beverley NOVAK, MD  Dextromethorphan -guaiFENesin  (ROBAFEN DM) 20-200 MG/20ML LIQD Take 5 mLs by mouth every 4 (four) hours as needed (chest congestion). Patient not taking: Reported on 07/24/2024 06/25/24   Rizwan, Saima, MD  ibuprofen  (ADVIL ) 400 MG tablet Take 1 tablet (400 mg total) by mouth every 4 (four) hours as needed for fever or headache. Patient not taking: Reported on 07/24/2024 06/25/24   Rizwan, Saima, MD  Lancet Device MISC 1 each by Does not apply route as directed. Dispense based on patient and insurance preference. Use up to four times daily as directed. (FOR ICD-10 E10.9, E11.9). 06/27/24   Sebastian Beverley NOVAK, MD  Lancets MISC 1 each by Does not apply route as directed. Dispense based on patient and insurance preference. Use up to four times daily as directed. (FOR ICD-10 E10.9, E11.9). 06/27/24   Sebastian Beverley NOVAK, MD  levofloxacin  (LEVAQUIN ) 750 MG tablet Take 1 tablet (750 mg total) by mouth daily. Patient not taking: Reported on 07/24/2024 06/26/24   Rizwan, Saima, MD  rosuvastatin  (CRESTOR ) 5 MG tablet Take 1 tablet by mouth once daily Patient not taking: Reported on 07/24/2024 01/25/24   Sebastian Beverley NOVAK, MD  senna-docusate  (SENOKOT-S) 8.6-50 MG tablet Take 1 tablet by mouth at bedtime as needed for mild constipation. Patient not taking: Reported on 07/24/2024 06/25/24   Rizwan, Saima, MD  tamsulosin  (FLOMAX ) 0.4 MG CAPS capsule Take 1 capsule (0.4 mg total) by mouth daily. Patient not taking: Reported on 07/24/2024 11/03/23   Sebastian Beverley NOVAK, MD     Critical care time: na     RODGER Emilio DEVONNA [facility] 07/25/2024, 8:15 AM  Please see [email or web address] for pager details.  From 7A-7P if no response, please call [phone]. After hours, please call ELink [phone].          [1]  Allergies Allergen Reactions   Penicillins Hives and Other (See Comments)    Has patient had a PCN reaction causing immediate rash, facial/tongue/throat swelling, SOB or lightheadedness with hypotension: Yes  Received Ancef  for surgery on 07/09/23 with no issues.   "

## 2024-07-25 NOTE — Plan of Care (Signed)
" °  Problem: Education: Goal: Ability to describe self-care measures that may prevent or decrease complications (Diabetes Survival Skills Education) will improve Outcome: Progressing   Problem: Fluid Volume: Goal: Ability to maintain a balanced intake and output will improve Outcome: Progressing   Problem: Health Behavior/Discharge Planning: Goal: Ability to identify and utilize available resources and services will improve Outcome: Progressing Goal: Ability to manage health-related needs will improve Outcome: Progressing   Problem: Nutritional: Goal: Maintenance of adequate nutrition will improve Outcome: Progressing   Problem: Skin Integrity: Goal: Risk for impaired skin integrity will decrease Outcome: Progressing   Problem: Tissue Perfusion: Goal: Adequacy of tissue perfusion will improve Outcome: Progressing   Problem: Education: Goal: Knowledge of General Education information will improve Description: Including pain rating scale, medication(s)/side effects and non-pharmacologic comfort measures Outcome: Progressing   Problem: Health Behavior/Discharge Planning: Goal: Ability to manage health-related needs will improve Outcome: Progressing   Problem: Clinical Measurements: Goal: Respiratory complications will improve Outcome: Progressing   Problem: Activity: Goal: Risk for activity intolerance will decrease Outcome: Progressing   Problem: Nutrition: Goal: Adequate nutrition will be maintained Outcome: Progressing   Problem: Coping: Goal: Level of anxiety will decrease Outcome: Progressing   Problem: Elimination: Goal: Will not experience complications related to bowel motility Outcome: Progressing Goal: Will not experience complications related to urinary retention Outcome: Progressing   Problem: Pain Managment: Goal: General experience of comfort will improve and/or be controlled Outcome: Progressing   Problem: Safety: Goal: Ability to remain free  from injury will improve Outcome: Progressing   Problem: Skin Integrity: Goal: Risk for impaired skin integrity will decrease Outcome: Progressing   "

## 2024-07-26 DIAGNOSIS — J189 Pneumonia, unspecified organism: Secondary | ICD-10-CM

## 2024-07-26 DIAGNOSIS — Z87891 Personal history of nicotine dependence: Secondary | ICD-10-CM

## 2024-07-26 LAB — GLUCOSE, CAPILLARY
Glucose-Capillary: 141 mg/dL — ABNORMAL HIGH (ref 70–99)
Glucose-Capillary: 159 mg/dL — ABNORMAL HIGH (ref 70–99)
Glucose-Capillary: 251 mg/dL — ABNORMAL HIGH (ref 70–99)
Glucose-Capillary: 154 mg/dL — ABNORMAL HIGH (ref 70–99)

## 2024-07-26 NOTE — Progress Notes (Signed)
 "        [facility]                                                                               Zaim Nitta, is a 64 y.o. male, DOB - [birth year], FMW:[id number] Admit date - 07/24/2024    Outpatient Primary MD for the patient is Sebastian Beverley NOVAK, MD  LOS - 2  days    Brief summary   Sean Martinez is a 64 y.o. male with medical history significant of COPD, DM2, HTN, HLD, BPH, tobacco use disorder who presented to [facility] ED on 07/24/2024 with shortness of breath, cough, fever/chills/sweats while at work today.  Acute onset.  Recently hospitalized 1/2 - 1/6 for Pseudomonas pneumonia, initially treated with azithromycin  and ceftriaxone  and discharged on 7-day course of Levaquin .  Patient reports he has been doing well since completing his antibiotics until today.  He works at a [employer], does not contribute to any known sick contacts although.  Denies any recent travel, no changes in the medication. CT angiogram chest negative for pulmonary embolism, extensive multifocal airspace consolidation involving all lobes largest 4.2 cm posterior right upper lobe with central cavitation.  Patient was started on vancomycin , cefepime .  Received IV Solu-Medrol , magnesium , albuterol  neb.  TRH consulted for admission for further evaluation and management of multifocal pneumonia despite recent antibiotic treatment.    Assessment & Plan    Assessment and Plan:  Multifocal pneumonia with cavitation, recent pseudomonas pneumonia in the setting of COPD Completed the course of levaquin .  ID consulted.  AFB , sputum cultures, legionella antigen pending.  ID on board and appreciate recommendations.  Blastomyces antigen is pending.  Crypto antigen is negative. Streptococcal antigen negative.    H/o COPD/ chronic respiratory failure on 2 lit of [place] oxygen.  Continue with bronchodilators.    Type 2 DM With hyperglycemia CBG (last 3)  Recent Labs    07/25/24 1944 07/26/24 0733  07/26/24 1219  GLUCAP 169* 141* 159*   Resume SSI.  A1c is 8.4   Hypertension Well controlled.     Hyperlipidemia Resume statin.    BPH  On flomax     Estimated body mass index is 22.81 kg/m as calculated from the following:   Height as of this encounter: 6' (1.829 m).   Weight as of this encounter: 76.3 kg.  Code Status: full code.  DVT Prophylaxis:  scd's   Level of Care: Level of care: Telemetry Family Communication: none at bedside.   Disposition Plan:     Remains inpatient appropriate:  pending.    Procedures:  None.   Consultants:   ID Pulmonology.   Antimicrobials:   Anti-infectives (From admission, onward)    Start     Dose/Rate Route Frequency Ordered Stop   07/25/24 0600  vancomycin  (VANCOREADY) IVPB 1250 mg/250 mL  Status:  Discontinued        1,250 mg 166.7 mL/hr over 90 Minutes Intravenous Every 12 hours 07/24/24 1814 07/25/24 1034   07/24/24 2300  ceFEPIme  (MAXIPIME ) 2 g in sodium chloride  0.9 % 100 mL IVPB        2 g 200 mL/hr over 30 Minutes Intravenous  Every 8 hours 07/24/24 1734     07/24/24 1700  ceFEPIme  (MAXIPIME ) 2 g in sodium chloride  0.9 % 100 mL IVPB        2 g 200 mL/hr over 30 Minutes Intravenous  Once 07/24/24 1647 07/24/24 1749   07/24/24 1645  aztreonam (AZACTAM) 2 g in sodium chloride  0.9 % 100 mL IVPB  Status:  Discontinued        2 g 200 mL/hr over 30 Minutes Intravenous  Once 07/24/24 1635 07/24/24 1645   07/24/24 1645  vancomycin  (VANCOCIN ) IVPB 1000 mg/200 mL premix  Status:  Discontinued        1,000 mg 200 mL/hr over 60 Minutes Intravenous  Once 07/24/24 1635 07/24/24 1644   07/24/24 1645  vancomycin  (VANCOREADY) IVPB 1500 mg/300 mL        1,500 mg 150 mL/hr over 120 Minutes Intravenous  Once 07/24/24 1645 07/24/24 1900        Medications  Scheduled Meds:  budesonide -glycopyrrolate -formoterol   2 puff Inhalation BID   insulin  aspart  0-15 Units Subcutaneous TID WC   insulin  aspart  0-5 Units Subcutaneous  QHS   losartan   12.5 mg Oral Daily   nicotine   21 mg Transdermal Daily   rosuvastatin   5 mg Oral Daily   tamsulosin   0.4 mg Oral Daily   Continuous Infusions:  ceFEPime  (MAXIPIME ) IV 2 g (07/26/24 1421)   PRN Meds:.acetaminophen  **OR** acetaminophen , albuterol , melatonin, ondansetron  **OR** ondansetron  (ZOFRAN ) IV, oxyCODONE , polyethylene glycol, sodium chloride  HYPERTONIC    Subjective:   Sean Martinez was seen and examined today.   No new complaints.   Objective:   Vitals:   07/25/24 1942 07/26/24 0423 07/26/24 0803 07/26/24 1438  BP: 120/83 125/78  128/80  Pulse: 96 83  93  Resp: 17 17  18   Temp: 98 F (36.7 C) 98.2 F (36.8 C)  98.2 F (36.8 C)  TempSrc:    Oral  SpO2: 95% 96% 98% 96%  Weight:      Height:        Intake/Output Summary (Last 24 hours) at 07/26/2024 1614 Last data filed at 07/26/2024 1100 Gross per 24 hour  Intake 480 ml  Output --  Net 480 ml   Filed Weights   07/24/24 1229 07/24/24 1955  Weight: 73.5 kg 76.3 kg     Exam General exam: Appears calm and comfortable  Respiratory system: Clear to auscultation. Respiratory effort normal. Cardiovascular system: S1 & S2 heard, RRR. No JVD,  Gastrointestinal system: Abdomen is nondistended, soft and nontender.  Central nervous system: Alert and oriented.  Extremities: Symmetric 5 x 5 power. Skin: No rashes, Psychiatry: Mood & affect appropriate.    Data Reviewed:  I have personally reviewed following labs and imaging studies   CBC Lab Results  Component Value Date   WBC 4.9 07/25/2024   RBC 4.23 07/25/2024   HGB 13.6 07/25/2024   HCT 40.2 07/25/2024   MCV 95.0 07/25/2024   MCH 32.2 07/25/2024   PLT 321 07/25/2024   MCHC 33.8 07/25/2024   RDW 14.2 07/25/2024   LYMPHSABS 1.5 04/19/2024   MONOABS 0.4 04/19/2024   EOSABS 0.0 04/19/2024   BASOSABS 0.0 04/19/2024     Last metabolic panel Lab Results  Component Value Date   NA 135 07/25/2024   K 4.2 07/25/2024   CL 99 07/25/2024    CO2 23 07/25/2024   BUN 19 07/25/2024   CREATININE 0.83 07/25/2024   GLUCOSE 244 (H) 07/25/2024   GFRNONAA >60 07/25/2024  GFRAA >60 04/12/2019   CALCIUM  8.9 07/25/2024   PROT 7.6 07/25/2024   ALBUMIN  3.6 07/25/2024   BILITOT 0.2 07/25/2024   ALKPHOS 113 07/25/2024   AST 32 07/25/2024   ALT 46 (H) 07/25/2024   ANIONGAP 13 07/25/2024    CBG (last 3)  Recent Labs    07/25/24 1944 07/26/24 0733 07/26/24 1219  GLUCAP 169* 141* 159*      Coagulation Profile: No results for input(s): INR, PROTIME in the last 168 hours.   Radiology Studies: No results found.      Elgie Butter M.D. [facility] 07/26/2024, 4:14 PM  Available via Epic secure chat 7am-7pm After 7 pm, please refer to night coverage provider listed on amion.    "

## 2024-07-26 NOTE — Progress Notes (Signed)
 "       Subjective:  No new complaints he does feel better now that he has been on antibiotics for several days   Antibiotics:  Anti-infectives (From admission, onward)    Start     Dose/Rate Route Frequency Ordered Stop   07/25/24 0600  vancomycin  (VANCOREADY) IVPB 1250 mg/250 mL  Status:  Discontinued        1,250 mg 166.7 mL/hr over 90 Minutes Intravenous Every 12 hours 07/24/24 1814 07/25/24 1034   07/24/24 2300  ceFEPIme  (MAXIPIME ) 2 g in sodium chloride  0.9 % 100 mL IVPB        2 g 200 mL/hr over 30 Minutes Intravenous Every 8 hours 07/24/24 1734     07/24/24 1700  ceFEPIme  (MAXIPIME ) 2 g in sodium chloride  0.9 % 100 mL IVPB        2 g 200 mL/hr over 30 Minutes Intravenous  Once 07/24/24 1647 07/24/24 1749   07/24/24 1645  aztreonam (AZACTAM) 2 g in sodium chloride  0.9 % 100 mL IVPB  Status:  Discontinued        2 g 200 mL/hr over 30 Minutes Intravenous  Once 07/24/24 1635 07/24/24 1645   07/24/24 1645  vancomycin  (VANCOCIN ) IVPB 1000 mg/200 mL premix  Status:  Discontinued        1,000 mg 200 mL/hr over 60 Minutes Intravenous  Once 07/24/24 1635 07/24/24 1644   07/24/24 1645  vancomycin  (VANCOREADY) IVPB 1500 mg/300 mL        1,500 mg 150 mL/hr over 120 Minutes Intravenous  Once 07/24/24 1645 07/24/24 1900       Medications: Scheduled Meds:  budesonide -glycopyrrolate -formoterol   2 puff Inhalation BID   insulin  aspart  0-15 Units Subcutaneous TID WC   insulin  aspart  0-5 Units Subcutaneous QHS   losartan   12.5 mg Oral Daily   nicotine   21 mg Transdermal Daily   rosuvastatin   5 mg Oral Daily   tamsulosin   0.4 mg Oral Daily   Continuous Infusions:  ceFEPime  (MAXIPIME ) IV 2 g (07/26/24 1421)   PRN Meds:.acetaminophen  **OR** acetaminophen , albuterol , melatonin, ondansetron  **OR** ondansetron  (ZOFRAN ) IV, oxyCODONE , polyethylene glycol, sodium chloride  HYPERTONIC    Objective: Weight change:   Intake/Output Summary (Last 24 hours) at 07/26/2024 1646 Last data  filed at 07/26/2024 1100 Gross per 24 hour  Intake 480 ml  Output --  Net 480 ml   Blood pressure 128/80, pulse 93, temperature 98.2 F (36.8 C), temperature source Oral, resp. rate 18, height 6' (1.829 m), weight 76.3 kg, SpO2 96%. Temp:  [98 F (36.7 C)-98.2 F (36.8 C)] 98.2 F (36.8 C) (02/05 1438) Pulse Rate:  [83-96] 93 (02/05 1438) Resp:  [17-18] 18 (02/05 1438) BP: (120-128)/(78-83) 128/80 (02/05 1438) SpO2:  [95 %-98 %] 96 % (02/05 1438)  Physical Exam: Physical Exam Constitutional:      Appearance: Normal appearance.  HENT:     Head: Normocephalic and atraumatic.  Eyes:     General:        Right eye: No discharge.        Left eye: No discharge.     Extraocular Movements: Extraocular movements intact.     Pupils: Pupils are equal, round, and reactive to light.  Cardiovascular:     Rate and Rhythm: Normal rate and regular rhythm.  Pulmonary:     Effort: No respiratory distress.     Breath sounds: No wheezing.  Abdominal:     General: There is no distension.  Musculoskeletal:  General: Normal range of motion.     Cervical back: Normal range of motion and neck supple.  Skin:    General: Skin is warm and dry.  Neurological:     General: No focal deficit present.     Mental Status: He is alert and oriented to person, place, and time.  Psychiatric:        Mood and Affect: Mood normal.        Behavior: Behavior normal.        Thought Content: Thought content normal.        Judgment: Judgment normal.      CBC:    BMET Recent Labs    07/24/24 1235 07/25/24 0034  NA 134* 135  K 4.4 4.2  CL 97* 99  CO2 26 23  GLUCOSE 176* 244*  BUN 15 19  CREATININE 0.74 0.83  CALCIUM  9.7 8.9     Liver Panel  Recent Labs    07/25/24 0034  PROT 7.6  ALBUMIN  3.6  AST 32  ALT 46*  ALKPHOS 113  BILITOT 0.2       Sedimentation Rate No results for input(s): ESRSEDRATE in the last 72 hours. C-Reactive Protein No results for input(s): CRP in the  last 72 hours.  Micro Results: Recent Results (from the past 720 hours)  Resp panel by RT-PCR (RSV, Flu A&B, Covid) Anterior Nasal Swab     Status: None   Collection Time: 07/24/24  3:34 PM   Specimen: Anterior Nasal Swab  Result Value Ref Range Status   SARS Coronavirus 2 by RT PCR NEGATIVE NEGATIVE Final    Comment: (NOTE) SARS-CoV-2 target nucleic acids are NOT DETECTED.  The SARS-CoV-2 RNA is generally detectable in upper respiratory specimens during the acute phase of infection. The lowest concentration of SARS-CoV-2 viral copies this assay can detect is 138 copies/mL. A negative result does not preclude SARS-Cov-2 infection and should not be used as the sole basis for treatment or other patient management decisions. A negative result may occur with  improper specimen collection/handling, submission of specimen other than nasopharyngeal swab, presence of viral mutation(s) within the areas targeted by this assay, and inadequate number of viral copies(<138 copies/mL). A negative result must be combined with clinical observations, patient history, and epidemiological information. The expected result is Negative.  Fact Sheet for Patients:  bloggercourse.com  Fact Sheet for Healthcare Providers:  seriousbroker.it  This test is no t yet approved or cleared by the [place]  FDA and  has been authorized for detection and/or diagnosis of SARS-CoV-2 by FDA under an Emergency Use Authorization (EUA). This EUA will remain  in effect (meaning this test can be used) for the duration of the COVID-19 declaration under Section 564(b)(1) of the Act, 21 U.S.C.section 360bbb-3(b)(1), unless the authorization is terminated  or revoked sooner.       Influenza A by PCR NEGATIVE NEGATIVE Final   Influenza B by PCR NEGATIVE NEGATIVE Final    Comment: (NOTE) The Xpert Xpress SARS-CoV-2/FLU/RSV plus assay is intended as an aid in the  diagnosis of influenza from Nasopharyngeal swab specimens and should not be used as a sole basis for treatment. Nasal washings and aspirates are unacceptable for Xpert Xpress SARS-CoV-2/FLU/RSV testing.  Fact Sheet for Patients: bloggercourse.com  Fact Sheet for Healthcare Providers: seriousbroker.it  This test is not yet approved or cleared by the [place]  FDA and has been authorized for detection and/or diagnosis of SARS-CoV-2 by FDA under an Emergency Use Authorization (EUA). This EUA  will remain in effect (meaning this test can be used) for the duration of the COVID-19 declaration under Section 564(b)(1) of the Act, 21 U.S.C. section 360bbb-3(b)(1), unless the authorization is terminated or revoked.     Resp Syncytial Virus by PCR NEGATIVE NEGATIVE Final    Comment: (NOTE) Fact Sheet for Patients: bloggercourse.com  Fact Sheet for Healthcare Providers: seriousbroker.it  This test is not yet approved or cleared by the [place]  FDA and has been authorized for detection and/or diagnosis of SARS-CoV-2 by FDA under an Emergency Use Authorization (EUA). This EUA will remain in effect (meaning this test can be used) for the duration of the COVID-19 declaration under Section 564(b)(1) of the Act, 21 U.S.C. section 360bbb-3(b)(1), unless the authorization is terminated or revoked.  Performed at [facility], [address], [place], [place] [address]   Culture, blood (routine x 2)     Status: None (Preliminary result)   Collection Time: 07/24/24  5:15 PM   Specimen: BLOOD RIGHT WRIST  Result Value Ref Range Status   Specimen Description   Final    BLOOD RIGHT WRIST Performed at [facility], [address], [place], [place] [address]    Special Requests   Final    BOTTLES DRAWN AEROBIC AND ANAEROBIC Blood Culture adequate volume Performed at  [facility], [address] [place], [place] [address]    Culture   Final    NO GROWTH 2 DAYS Performed at [facility], [address], [place], [place] [address]    Report Status PENDING  Incomplete  Fungus culture, blood     Status: None (Preliminary result)   Collection Time: 07/24/24  5:34 PM   Specimen: BLOOD  Result Value Ref Range Status   Specimen Description BLOOD RIGHT ANTECUBITAL  Final   Special Requests   Final    BOTTLES DRAWN AEROBIC AND ANAEROBIC Blood Culture adequate volume   Culture   Final    NO GROWTH < 24 HOURS Performed at [facility], [address], [place], [place] [address]    Report Status PENDING  Incomplete  MRSA Next Gen by PCR, Nasal     Status: None   Collection Time: 07/25/24  1:00 AM   Specimen: Nasal Mucosa; Nasal Swab  Result Value Ref Range Status   MRSA by PCR Next Gen NOT DETECTED NOT DETECTED Final    Comment: (NOTE) The GeneXpert MRSA Assay (FDA approved for NASAL specimens only), is one component of a comprehensive MRSA colonization surveillance program. It is not intended to diagnose MRSA infection nor to guide or monitor treatment for MRSA infections. Test performance is not FDA approved in patients less than 81 years old. Performed at [facility], [address], [place], [place] [address]   Expectorated Sputum Assessment w Gram Stain, Rflx to Resp Cult     Status: None   Collection Time: 07/25/24 12:19 PM   Specimen: Expectorated Sputum  Result Value Ref Range Status   Specimen Description EXPECTORATED SPUTUM  Final   Special Requests NONE  Final   Sputum evaluation   Final    THIS SPECIMEN IS ACCEPTABLE FOR SPUTUM CULTURE Performed at [facility], [address] [place], [place] [address]    Report Status 07/25/2024 FINAL  Final  Culture, Respiratory w Gram Stain     Status: None (Preliminary result)   Collection Time: 07/25/24 12:19 PM  Result  Value Ref Range Status  Specimen Description   Final    EXPECTORATED SPUTUM Performed at [facility], [address], [place], [place] [address]    Special Requests   Final    NONE Reflexed from [id number] Performed at [facility], [address] [place], [place] [address]    Gram Stain   Final    RARE WBC PRESENT, PREDOMINANTLY PMN FEW GRAM POSITIVE COCCI FEW GRAM POSITIVE RODS FEW BUDDING YEAST SEEN RARE GRAM NEGATIVE RODS    Culture   Final    TOO YOUNG TO READ Performed at [facility], [address] [place], [place] [address]    Report Status PENDING  Incomplete    Studies/Results: No results found.    Assessment/Plan:  INTERVAL HISTORY: 2 AFB sputa collected to date   Principal Problem:   Multifocal pneumonia    Sean Martinez is a 64 y.o. male with history of prior smoking COPD who had been hospitalized in June for Pseudomonas pneumonia discharged and sent a levofloxacin  now admitted with worsening dyspnea cough and CT scan showing cavitary pneumonia.  #1 Cavitary pneumonia We DO need to rule out TB  At minimum we need a third AFB sputa for smear and culture (I would prefer a bronchoscopy to obatin deep specimens for not just AFB but also fungal and bacterial cultures) but I can also work with just getting sputa  Sputum culture being reincubated  He seems better on cefepime    Crypto ag negative Pneumococcal urine test negative HIV negative  Airborne precautions  I personally spent a total of 50 minutes in the care of the patient today including preparing to see the patient, getting/reviewing separately obtained history, performing a medically appropriate exam/evaluation, counseling and educating, placing orders, referring and communicating with other health care professionals, documenting clinical information in the EHR, independently interpreting results, and communicating results.   Evaluation of  the patient requires complex antimicrobial therapy evaluation, counseling , isolation needs to reduce disease transmission and risk assessment and mitigation.      LOS: 2 days   Jomarie Fleeta Rothman 07/26/2024, 4:46 PM  "

## 2024-07-26 NOTE — Progress Notes (Signed)
 PT refused flutter device- states he already has one from previous admission that he will use here.

## 2024-07-26 NOTE — Plan of Care (Signed)

## 2024-07-27 LAB — CULTURE, BLOOD (ROUTINE X 2)
Culture: NO GROWTH
Special Requests: ADEQUATE

## 2024-07-27 LAB — FUNGUS CULTURE, BLOOD
Culture: NO GROWTH
Special Requests: ADEQUATE

## 2024-07-27 LAB — ANCA TITERS
Atypical P-ANCA titer: <1:20 {titer}
C-ANCA: <1:20 {titer}
P-ANCA: <1:20 {titer}

## 2024-07-27 LAB — LEGIONELLA PNEUMOPHILA SEROGP 1 UR AG: L. pneumophila Serogp 1 Ur Ag: NEGATIVE

## 2024-07-27 LAB — CULTURE, RESPIRATORY W GRAM STAIN

## 2024-07-27 LAB — GLUCOSE, CAPILLARY
Glucose-Capillary: 146 mg/dL — ABNORMAL HIGH (ref 70–99)
Glucose-Capillary: 152 mg/dL — ABNORMAL HIGH (ref 70–99)
Glucose-Capillary: 212 mg/dL — ABNORMAL HIGH (ref 70–99)
Glucose-Capillary: 250 mg/dL — ABNORMAL HIGH (ref 70–99)

## 2024-07-27 LAB — ACID FAST SMEAR (AFB, MYCOBACTERIA): Acid Fast Smear: NEGATIVE

## 2024-07-27 MED ORDER — SODIUM CHLORIDE 3 % IN NEBU
4.0000 mL | INHALATION_SOLUTION | Freq: Once | RESPIRATORY_TRACT | Status: AC
  Administered 2024-07-27: 4 mL via RESPIRATORY_TRACT
  Filled 2024-07-27: qty 4

## 2024-07-27 MED ORDER — KETOROLAC TROMETHAMINE 15 MG/ML IJ SOLN
15.0000 mg | Freq: Once | INTRAMUSCULAR | Status: AC
  Administered 2024-07-27: 15 mg via INTRAVENOUS
  Filled 2024-07-27: qty 1

## 2024-07-27 NOTE — Progress Notes (Signed)
 "        [facility]                                                                               Sean Martinez, is a 64 y.o. male, DOB - 05/10/1961, FMW:[id number] Admit date - 07/24/2024    Outpatient Primary MD for the patient is Sean Beverley NOVAK, MD  LOS - 3  days    Brief summary   Sean Martinez is a 64 y.o. male with medical history significant of COPD, DM2, HTN, HLD, BPH, tobacco use disorder who presented to [facility] ED on 07/24/2024 with shortness of breath, cough, fever/chills/sweats while at work today.  Acute onset.  Recently hospitalized 1/2 - 1/6 for Pseudomonas pneumonia, initially treated with azithromycin  and ceftriaxone  and discharged on 7-day course of Levaquin .  Patient reports he has been doing well since completing his antibiotics until today.  He works at a [employer], does not contribute to any known sick contacts although.  Denies any recent travel, no changes in the medication. CT angiogram chest negative for pulmonary embolism, extensive multifocal airspace consolidation involving all lobes largest 4.2 cm posterior right upper lobe with central cavitation.  Patient was started on vancomycin , cefepime .  Received IV Solu-Medrol , magnesium , albuterol  neb.  TRH consulted for admission for further evaluation and management of multifocal pneumonia despite recent antibiotic treatment.    Assessment & Plan    Assessment and Plan:  Multifocal pneumonia with cavitation, recent pseudomonas pneumonia in the setting of COPD Completed the course of levaquin .  ID consulted.  AFB , sputum cultures, legionella antigen pending.  ID on board and appreciate recommendations.  Blastomyces antigen is pending.  Crypto antigen is negative. Streptococcal antigen negative.  No new complaints.    H/o COPD/ chronic respiratory failure on 2 lit of [place] oxygen.  Continue with bronchodilators.    Type 2 DM With hyperglycemia CBG (last 3)  Recent Labs     07/26/24 2044 07/27/24 0723 07/27/24 1059  GLUCAP 154* 146* 152*   Resume SSI.  A1c is 8.4   Hypertension Optimal BP parameters.     Hyperlipidemia Resume statin.    BPH  On flomax     Estimated body mass index is 22.81 kg/m as calculated from the following:   Height as of this encounter: 6' (1.829 m).   Weight as of this encounter: 76.3 kg.  Code Status: full code.  DVT Prophylaxis:  scd's   Level of Care: Level of care: Med-Surg Family Communication: none at bedside.   Disposition Plan:     Remains inpatient appropriate:  pending.    Procedures:  None.   Consultants:   ID Pulmonology.   Antimicrobials:   Anti-infectives (From admission, onward)    Start     Dose/Rate Route Frequency Ordered Stop   07/25/24 0600  vancomycin  (VANCOREADY) IVPB 1250 mg/250 mL  Status:  Discontinued        1,250 mg 166.7 mL/hr over 90 Minutes Intravenous Every 12 hours 07/24/24 1814 07/25/24 1034   07/24/24 2300  ceFEPIme  (MAXIPIME ) 2 g in sodium chloride  0.9 % 100 mL IVPB        2 g 200  mL/hr over 30 Minutes Intravenous Every 8 hours 07/24/24 1734     07/24/24 1700  ceFEPIme  (MAXIPIME ) 2 g in sodium chloride  0.9 % 100 mL IVPB        2 g 200 mL/hr over 30 Minutes Intravenous  Once 07/24/24 1647 07/24/24 1749   07/24/24 1645  aztreonam (AZACTAM) 2 g in sodium chloride  0.9 % 100 mL IVPB  Status:  Discontinued        2 g 200 mL/hr over 30 Minutes Intravenous  Once 07/24/24 1635 07/24/24 1645   07/24/24 1645  vancomycin  (VANCOCIN ) IVPB 1000 mg/200 mL premix  Status:  Discontinued        1,000 mg 200 mL/hr over 60 Minutes Intravenous  Once 07/24/24 1635 07/24/24 1644   07/24/24 1645  vancomycin  (VANCOREADY) IVPB 1500 mg/300 mL        1,500 mg 150 mL/hr over 120 Minutes Intravenous  Once 07/24/24 1645 07/24/24 1900        Medications  Scheduled Meds:  budesonide -glycopyrrolate -formoterol   2 puff Inhalation BID   insulin  aspart  0-15 Units Subcutaneous TID WC    insulin  aspart  0-5 Units Subcutaneous QHS   losartan   12.5 mg Oral Daily   nicotine   21 mg Transdermal Daily   rosuvastatin   5 mg Oral Daily   tamsulosin   0.4 mg Oral Daily   Continuous Infusions:  ceFEPime  (MAXIPIME ) IV 2 g (07/27/24 1327)   PRN Meds:.acetaminophen  **OR** acetaminophen , albuterol , melatonin, ondansetron  **OR** ondansetron  (ZOFRAN ) IV, oxyCODONE , polyethylene glycol, sodium chloride  HYPERTONIC    Subjective:   Sean Martinez was seen and examined today.   No new complaints.   Objective:   Vitals:   07/27/24 0523 07/27/24 0650 07/27/24 1259 07/27/24 1416  BP: (!) 134/90   124/79  Pulse: 99   96  Resp: 19   20  Temp: 98.5 F (36.9 C)   98.8 F (37.1 C)  TempSrc: Oral   Oral  SpO2: 94% 95% 94% 97%  Weight:      Height:        Intake/Output Summary (Last 24 hours) at 07/27/2024 1600 Last data filed at 07/27/2024 1329 Gross per 24 hour  Intake 743.56 ml  Output 1 ml  Net 742.56 ml   Filed Weights   07/24/24 1229 07/24/24 1955  Weight: 73.5 kg 76.3 kg     Exam General exam: Appears calm and comfortable  Respiratory system: Clear to auscultation. Respiratory effort normal. Cardiovascular system: S1 & S2 heard, RRR.  Gastrointestinal system: Abdomen is nondistended, soft and nontender.  Central nervous system: Alert and oriented.  Extremities: Symmetric 5 x 5 power. Skin: No rashes,  Psychiatry: Mood & affect appropriate.     Data Reviewed:  I have personally reviewed following labs and imaging studies   CBC Lab Results  Component Value Date   WBC 4.9 07/25/2024   RBC 4.23 07/25/2024   HGB 13.6 07/25/2024   HCT 40.2 07/25/2024   MCV 95.0 07/25/2024   MCH 32.2 07/25/2024   PLT 321 07/25/2024   MCHC 33.8 07/25/2024   RDW 14.2 07/25/2024   LYMPHSABS 1.5 04/19/2024   MONOABS 0.4 04/19/2024   EOSABS 0.0 04/19/2024   BASOSABS 0.0 04/19/2024     Last metabolic panel Lab Results  Component Value Date   NA 135 07/25/2024   K 4.2 07/25/2024    CL 99 07/25/2024   CO2 23 07/25/2024   BUN 19 07/25/2024   CREATININE 0.83 07/25/2024   GLUCOSE 244 (H) 07/25/2024  GFRNONAA >60 07/25/2024   GFRAA >60 04/12/2019   CALCIUM  8.9 07/25/2024   PROT 7.6 07/25/2024   ALBUMIN  3.6 07/25/2024   BILITOT 0.2 07/25/2024   ALKPHOS 113 07/25/2024   AST 32 07/25/2024   ALT 46 (H) 07/25/2024   ANIONGAP 13 07/25/2024    CBG (last 3)  Recent Labs    07/26/24 2044 07/27/24 0723 07/27/24 1059  GLUCAP 154* 146* 152*      Coagulation Profile: No results for input(s): INR, PROTIME in the last 168 hours.   Radiology Studies: No results found.      Elgie Butter M.D. [facility] 07/27/2024, 4:00 PM  Available via Epic secure chat 7am-7pm After 7 pm, please refer to night coverage provider listed on amion.    "

## 2024-07-27 NOTE — Progress Notes (Signed)
" ° ° ° ° °  Patient still has not had a third sputum for AFB smear and culture  I have ordered HSaline that Respiratory Therapy can give him to induce and collect AFB smear and culture on sputum #3  If they cannot obtain a culture and they document this this will count as one negative smear.  F/u other smears  Continue cefepime  as an inpatient  I think he can complete a 7 day course of antibacterial therapy  I still think a bronchoscopy would be helpful  My partner Dr. Luiz is available this weekend for questions and will follow-up on culture data and Dr. Overton will take over the service on Monday.   Sean Martinez 07/27/2024, 12:47 PM  "

## 2024-07-27 NOTE — Progress Notes (Signed)
 Third AFB smear sent.

## 2024-07-27 NOTE — Progress Notes (Signed)
" °   07/27/24 1320  TOC Brief Assessment  Insurance and Status Reviewed  Patient has primary care physician Yes  Home environment has been reviewed home with self  Prior level of function: independent  Prior/Current Home Services No current home services  Social Drivers of Health Review SDOH reviewed no interventions necessary  Readmission risk has been reviewed Yes  Transition of care needs no transition of care needs at this time    "

## 2024-07-30 ENCOUNTER — Other Ambulatory Visit

## 2024-07-31 ENCOUNTER — Ambulatory Visit: Admitting: Family Medicine

## 2024-08-03 ENCOUNTER — Telehealth: Admitting: Licensed Clinical Social Worker

## 2024-08-09 ENCOUNTER — Ambulatory Visit: Admitting: Pulmonary Disease
